# Patient Record
Sex: Female | Born: 1965 | Race: White | Hispanic: No | Marital: Single | Smoking: Never smoker
Health system: Southern US, Community
[De-identification: ages and names within clinical notes are randomized; demographics above are authoritative.]

## PROBLEM LIST (undated history)

## (undated) DIAGNOSIS — F419 Anxiety disorder, unspecified: Secondary | ICD-10-CM

## (undated) DIAGNOSIS — M1990 Unspecified osteoarthritis, unspecified site: Secondary | ICD-10-CM

## (undated) DIAGNOSIS — K922 Gastrointestinal hemorrhage, unspecified: Secondary | ICD-10-CM

## (undated) DIAGNOSIS — J45909 Unspecified asthma, uncomplicated: Secondary | ICD-10-CM

## (undated) DIAGNOSIS — J189 Pneumonia, unspecified organism: Secondary | ICD-10-CM

## (undated) DIAGNOSIS — K649 Unspecified hemorrhoids: Secondary | ICD-10-CM

## (undated) DIAGNOSIS — I10 Essential (primary) hypertension: Secondary | ICD-10-CM

## (undated) DIAGNOSIS — E785 Hyperlipidemia, unspecified: Secondary | ICD-10-CM

## (undated) DIAGNOSIS — K449 Diaphragmatic hernia without obstruction or gangrene: Secondary | ICD-10-CM

## (undated) DIAGNOSIS — F79 Unspecified intellectual disabilities: Secondary | ICD-10-CM

## (undated) DIAGNOSIS — K5520 Angiodysplasia of colon without hemorrhage: Secondary | ICD-10-CM

## (undated) DIAGNOSIS — K5790 Diverticulosis of intestine, part unspecified, without perforation or abscess without bleeding: Secondary | ICD-10-CM

## (undated) DIAGNOSIS — K219 Gastro-esophageal reflux disease without esophagitis: Secondary | ICD-10-CM

## (undated) HISTORY — PX: EYE SURGERY: SHX253

## (undated) HISTORY — PX: CHOLECYSTECTOMY: SHX55

---

## 1998-06-25 ENCOUNTER — Other Ambulatory Visit: Admission: RE | Admit: 1998-06-25 | Discharge: 1998-06-25 | Payer: Self-pay | Admitting: Obstetrics and Gynecology

## 1999-07-06 ENCOUNTER — Other Ambulatory Visit: Admission: RE | Admit: 1999-07-06 | Discharge: 1999-07-06 | Payer: Self-pay | Admitting: Obstetrics and Gynecology

## 1999-10-22 ENCOUNTER — Encounter: Payer: Self-pay | Admitting: Family Medicine

## 1999-10-22 ENCOUNTER — Encounter: Admission: RE | Admit: 1999-10-22 | Discharge: 1999-10-22 | Payer: Self-pay | Admitting: Family Medicine

## 1999-11-23 ENCOUNTER — Encounter: Payer: Self-pay | Admitting: Family Medicine

## 1999-11-23 ENCOUNTER — Encounter: Admission: RE | Admit: 1999-11-23 | Discharge: 1999-11-23 | Payer: Self-pay | Admitting: Family Medicine

## 2000-07-05 ENCOUNTER — Other Ambulatory Visit: Admission: RE | Admit: 2000-07-05 | Discharge: 2000-07-05 | Payer: Self-pay | Admitting: Obstetrics and Gynecology

## 2000-11-17 ENCOUNTER — Emergency Department (HOSPITAL_COMMUNITY): Admission: EM | Admit: 2000-11-17 | Discharge: 2000-11-17 | Payer: Self-pay

## 2001-07-06 ENCOUNTER — Other Ambulatory Visit: Admission: RE | Admit: 2001-07-06 | Discharge: 2001-07-06 | Payer: Self-pay | Admitting: Obstetrics and Gynecology

## 2002-07-12 ENCOUNTER — Other Ambulatory Visit: Admission: RE | Admit: 2002-07-12 | Discharge: 2002-07-12 | Payer: Self-pay | Admitting: Obstetrics and Gynecology

## 2003-01-24 ENCOUNTER — Encounter: Admission: RE | Admit: 2003-01-24 | Discharge: 2003-01-24 | Payer: Self-pay | Admitting: Family Medicine

## 2003-01-24 ENCOUNTER — Encounter: Payer: Self-pay | Admitting: Family Medicine

## 2003-02-04 ENCOUNTER — Ambulatory Visit (HOSPITAL_COMMUNITY): Admission: RE | Admit: 2003-02-04 | Discharge: 2003-02-04 | Payer: Self-pay | Admitting: Gastroenterology

## 2003-09-23 ENCOUNTER — Other Ambulatory Visit: Admission: RE | Admit: 2003-09-23 | Discharge: 2003-09-23 | Payer: Self-pay | Admitting: Obstetrics and Gynecology

## 2004-05-11 ENCOUNTER — Encounter: Admission: RE | Admit: 2004-05-11 | Discharge: 2004-05-11 | Payer: Self-pay | Admitting: Family Medicine

## 2004-06-16 ENCOUNTER — Ambulatory Visit (HOSPITAL_COMMUNITY): Admission: RE | Admit: 2004-06-16 | Discharge: 2004-06-17 | Payer: Self-pay | Admitting: Surgery

## 2004-06-16 ENCOUNTER — Encounter (INDEPENDENT_AMBULATORY_CARE_PROVIDER_SITE_OTHER): Payer: Self-pay | Admitting: *Deleted

## 2004-10-29 ENCOUNTER — Other Ambulatory Visit: Admission: RE | Admit: 2004-10-29 | Discharge: 2004-10-29 | Payer: Self-pay | Admitting: Obstetrics and Gynecology

## 2005-02-28 ENCOUNTER — Encounter: Admission: RE | Admit: 2005-02-28 | Discharge: 2005-02-28 | Payer: Self-pay | Admitting: Family Medicine

## 2005-03-25 ENCOUNTER — Emergency Department (HOSPITAL_COMMUNITY): Admission: EM | Admit: 2005-03-25 | Discharge: 2005-03-25 | Payer: Self-pay | Admitting: Emergency Medicine

## 2005-12-13 ENCOUNTER — Other Ambulatory Visit: Admission: RE | Admit: 2005-12-13 | Discharge: 2005-12-13 | Payer: Self-pay | Admitting: Obstetrics & Gynecology

## 2006-03-20 ENCOUNTER — Ambulatory Visit: Payer: Self-pay | Admitting: Family Medicine

## 2006-03-31 ENCOUNTER — Ambulatory Visit: Payer: Self-pay | Admitting: Family Medicine

## 2006-04-06 ENCOUNTER — Encounter: Admission: RE | Admit: 2006-04-06 | Discharge: 2006-04-06 | Payer: Self-pay | Admitting: Obstetrics and Gynecology

## 2006-07-05 ENCOUNTER — Encounter: Admission: RE | Admit: 2006-07-05 | Discharge: 2006-07-05 | Payer: Self-pay | Admitting: Family Medicine

## 2006-07-05 ENCOUNTER — Ambulatory Visit: Payer: Self-pay | Admitting: Family Medicine

## 2006-07-14 ENCOUNTER — Ambulatory Visit: Payer: Self-pay | Admitting: Family Medicine

## 2006-07-31 ENCOUNTER — Emergency Department (HOSPITAL_COMMUNITY): Admission: EM | Admit: 2006-07-31 | Discharge: 2006-08-01 | Payer: Self-pay | Admitting: Emergency Medicine

## 2006-07-31 ENCOUNTER — Encounter: Admission: RE | Admit: 2006-07-31 | Discharge: 2006-07-31 | Payer: Self-pay | Admitting: Family Medicine

## 2006-07-31 ENCOUNTER — Ambulatory Visit: Payer: Self-pay | Admitting: Family Medicine

## 2006-08-01 ENCOUNTER — Ambulatory Visit: Payer: Self-pay | Admitting: Family Medicine

## 2006-10-03 ENCOUNTER — Ambulatory Visit: Payer: Self-pay | Admitting: Family Medicine

## 2006-10-17 ENCOUNTER — Ambulatory Visit: Payer: Self-pay | Admitting: Family Medicine

## 2006-11-13 ENCOUNTER — Ambulatory Visit: Payer: Self-pay | Admitting: Internal Medicine

## 2006-12-08 ENCOUNTER — Ambulatory Visit: Payer: Self-pay | Admitting: Family Medicine

## 2006-12-14 ENCOUNTER — Ambulatory Visit: Payer: Self-pay | Admitting: Internal Medicine

## 2007-01-10 ENCOUNTER — Other Ambulatory Visit: Admission: RE | Admit: 2007-01-10 | Discharge: 2007-01-10 | Payer: Self-pay | Admitting: Obstetrics and Gynecology

## 2007-03-26 ENCOUNTER — Ambulatory Visit: Payer: Self-pay | Admitting: Family Medicine

## 2007-06-12 ENCOUNTER — Encounter: Admission: RE | Admit: 2007-06-12 | Discharge: 2007-06-12 | Payer: Self-pay | Admitting: Obstetrics and Gynecology

## 2007-08-02 DIAGNOSIS — J309 Allergic rhinitis, unspecified: Secondary | ICD-10-CM | POA: Insufficient documentation

## 2007-08-02 DIAGNOSIS — J45998 Other asthma: Secondary | ICD-10-CM | POA: Insufficient documentation

## 2007-08-17 ENCOUNTER — Ambulatory Visit: Payer: Self-pay | Admitting: Internal Medicine

## 2008-01-25 ENCOUNTER — Other Ambulatory Visit: Admission: RE | Admit: 2008-01-25 | Discharge: 2008-01-25 | Payer: Self-pay | Admitting: Obstetrics and Gynecology

## 2008-07-25 ENCOUNTER — Encounter: Admission: RE | Admit: 2008-07-25 | Discharge: 2008-07-25 | Payer: Self-pay | Admitting: Family Medicine

## 2008-08-11 ENCOUNTER — Encounter: Admission: RE | Admit: 2008-08-11 | Discharge: 2008-08-11 | Payer: Self-pay | Admitting: Family Medicine

## 2009-12-08 DIAGNOSIS — K5730 Diverticulosis of large intestine without perforation or abscess without bleeding: Secondary | ICD-10-CM | POA: Insufficient documentation

## 2009-12-08 DIAGNOSIS — Q2733 Arteriovenous malformation of digestive system vessel: Secondary | ICD-10-CM | POA: Insufficient documentation

## 2010-07-04 ENCOUNTER — Encounter: Payer: Self-pay | Admitting: Obstetrics and Gynecology

## 2010-08-10 DIAGNOSIS — I10 Essential (primary) hypertension: Secondary | ICD-10-CM | POA: Insufficient documentation

## 2010-08-10 DIAGNOSIS — J4540 Moderate persistent asthma, uncomplicated: Secondary | ICD-10-CM | POA: Insufficient documentation

## 2010-10-26 NOTE — Assessment & Plan Note (Signed)
[facility]                             PULMONARY OFFICE NOTE   Shelley [name], Shelley [name]                    MRN:          [id number]  DATE:11/13/2006                            DOB:          08/06/1965    PROBLEM:  A 45 year old woman who comes in the company of her power of  attorney, Shelley [name], with whom she lives, for evaluation of asthma and  allergies.   HISTORY:  The patient has a learning disability and lives with Mrs.  [name].  This is a 45 year old woman diagnosed with asthma and allergic  rhinitis in the 1990s.  She says she was in her late 17s and remembers  noting symptoms first after a bad cold.  She has needed only emergency  room visits without hospitalization and has never had a home nebulizer.  Viral illnesses and pollen exposures have been triggers.  She was skin  test positive and on allergy vaccine a long time ago with specifics  unknown.  She has felt fairly stable, but wishes to establish care.  She  is followed by Dr. Sharlot [name] for primary care.   MEDICATIONS:  1. Maxair.  2. Birth control pill.  3. Triamterene/HCTZ 37.5/25 mg.   Drug intolerant to aspirin and cortisone.   REVIEW OF SYSTEMS:  Mild occasional wheeze, occasional reflux which she  says is not bad, weight fairly stable.  No fever, purulent discharge,  adenopathy, rash, chest pain, nausea, vomiting, joint pain or ankle  edema.   PAST MEDICAL HISTORY:  Hypertension, asthma, allergic rhinitis,  pneumonia in the Winter of 2007, cholecystectomy, recent fracture of  thumb for which her arm is casted.  No recognized problems with exposure  to latex, contrast dye, but aspirin causes nausea.   SOCIAL HISTORY:  Never smoked, not married, no children, not employed.   FAMILY HISTORY:  She is adopted with details unknown.   OBJECTIVE:  Weight 215 pounds, BP 126/71, pulse 71, room air saturation  94%.  A pleasant alert woman.  There is a cast on her left forearm.   No  evident rash.  Adenopathy not found.  HEENT:  Turbinates are edematous.  Conjunctivae are clear.  Pharynx is  clear.  There is no neck vein distension or stridor.  CHEST:  Wheezy cough.  During quiet breathing, there are no rales,  wheeze or rhonchi heard and no dullness.  HEART SOUNDS:  Regular without murmur or gallop.  ABDOMEN:  Softly obese.  EXTREMITIES:  Feet are without cyanosis, clubbing or edema.   IMPRESSION:  1. Allergic rhinitis.  2. Asthma, probably mild and intermittent.   PLAN:  We will schedule pulmonary function test.  I refilled her Maxair  as a rescue inhaler with discussion and gave sample Advair 100/50 with  discussion for 1 puff b.i.d. and mouth care.  Schedule return in one  month, earlier p.r.n.     Shelley D. Maple Hudson, MD, Shelley [name], FACP  Electronically Signed    CDY/MedQ  DD: 11/16/2006  DT: 11/17/2006  Job #: [id number]   cc:   Shelley [name], ShelleyD.

## 2010-10-26 NOTE — Assessment & Plan Note (Signed)
[facility]                             PULMONARY OFFICE NOTE   NAME:Shelley [name]                    MRN:          [id number]  DATE:12/14/2006                            DOB:          [birth year]    PROBLEMS:  1. Allergic rhinitis.  2. Mild intermittent asthma.   HISTORY:  She returns for followup saying she has felt fairly well.  Her  left hand is splinted after breaking her thumb.   MEDICATIONS:  1. Maxair.  2. Birth control pills.  3. Triamterene/HCTZ 37.5/25.  4. Advair 100/50.   DRUG INTOLERANCE:  1. ASPIRIN.  2. CORTISONE.   OBJECTIVE:  VITAL SIGNS:  Weight 224 pounds, BP 132/86, pulse 84, room  air saturation 98%.  HEENT:  Nose and throat are clear.  LUNGS:  Clear to P[employer][name] with breathing unlabored, no cough or wheeze.  HEART:  Sounds regular and normal.   PFT:  Moderate obstructive airway disease with an FEV1 of 1.39 (48% of  predicted), and significant response to bronchodilator.  FEV1FVC ratio  0.54.  There was evidence of air trapping with increased residual volume  and diffusion was moderately reduced at 69%.  These suggest an emphysema  component but more likely it is chronic fixed asthma.   PLAN:  1. I emphasized weight loss and exercise to build endurance and reduce      diaphragm crowding by her abdomen.  2. Try Spiriva once daily to see what effect that has.  3. Blood for alpha-1 antitrypsin level.  4. Schedule return in 1 year, earlier p.r.n.    Clinton D. Maple Hudson, MD, Tonny Bollman, FACP  Electronically Signed   CDY/MedQ  DD: 12/16/2006  DT: 12/16/2006  Job #: [id number]   cc:   Sharlot Gowda, M.D.

## 2010-10-29 NOTE — Op Note (Signed)
[name], [name]             ACCOUNT NO.:  [id number]   MEDICAL RECORD NO.:  [id number]          PATIENT TYPE:  OIB   LOCATION:  NA                           FACILITY:  [facility]   PHYSICIAN:  Currie Paris, M.D.DATE OF BIRTH:  [birth year]   DATE OF PROCEDURE:  06/16/2004  DATE OF DISCHARGE:                                 OPERATIVE REPORT   OFFICE MEDICAL RECORD NO.:  [id number].   PREOPERATIVE DIAGNOSIS:  Chronic calculus cholecystitis.   POSTOPERATIVE DIAGNOSIS:  Chronic calculus cholecystitis.   OPERATION:  Laparoscopic cholecystectomy with intraoperative cholangiogram.   SURGEON:  Currie Paris, M.D.   ASSISTANT:  Ollen Gross. Carolynne Edouard, M.D.   ANESTHESIA:  General endotracheal.   CLINICAL HISTORY:  This patient is a 45 year old with biliary-type symptoms  and the finding of multiple stones in her gallbladder.  She elected to  proceed to cholecystectomy.  We planned a cholangiogram because she had what  looked like a slightly large common duct on ultrasound.   DESCRIPTION OF PROCEDURE:  The patient was seen in the holding area and she  had no further questions.  She was taken to the operating room and after  satisfactory general endotracheal anesthesia had been obtained the abdomen  was prepped and draped.   0.25% plain Marcaine was injected for each incision.  The initial abdominal  incision was made above the umbilicus.  Because of the patient's obesity, I  thought an umbilical incision would provide too long of a length for good  visualization of the camera.  The fascia was identified and opened, a purse-  string was placed, the Hasson inserted and the abdomen insufflated.  The  patient's abdomen was insufflated to 15 and she was placed in reverse  Trendelenburg and tilted to the left.   A 10-11 was placed in the epigastrium and two 5's laterally.  The  gallbladder was retracted over the liver and initial dissection began around  the ampulla of the gallbladder.   This was initially a little difficult to  evaluate and I could not tell if the common duct, which was noted to be  large, was pulled up on the gallbladder.  Rather, the infundibulum of the  gallbladder curved up under it.  With some retraction, we opened the  parenchyma on either side of the gallbladder and I was able to clearly  identify the cystic artery coming up on the gallbladder and once that was  clearly identified I divided it after clipping it.  This gave [place] a little  bit more retraction and I was able to free up some more small vessels coming  up onto the gallbladder and with lateral traction eventually I identified  the cystic duct and made a large window behind it so that we clearly had the  cystic duct identified.  Once this was done, I clipped it and opened it and  did operative cholangiography.  We had good filling of the common duct and  hepatic radicals and duodenum, but there was a question of a tiny stone in  the distal duct.   The cystic duct was fairly  attenuous, so I went ahead and took the catheter  out, put three clips on it and divided it.  The gallbladder was removed from  below to above and was actually packed with stones.  Multiple stones were  spilled as we could not get a dissection plane between the gallbladder and  the liver.  Once we got the gallbladder disconnected, it was placed in a bag  and some of the stones were picked up and placed in the bag initially and  the bag then sealed and parked over the liver.  I then spent about 20  minutes individually retrieving all of the stones that had spilled and once  we had them all out as far as we could tell, we irrigated and took a close  look with a 30 degree scope so we could see back in the crevices behind the  liver and be sure we did not leave any stones behind.  Once we felt that all  the stones were retrieved, the gallbladder was pulled out the umbilical  port.  We did a final irrigation and did remove  the lateral ports.  The  umbilical site was closed with the purse-string and the abdomen desufflated  through the epigastric site.  The skin was closed with 4-0 Monocryl  subcuticular and Dermabond.  Throughout the patient tolerated the procedure  well.  There were no intraoperative complications and all counts were  correct.      Chri   CJS/MEDQ  D:  06/16/2004  T:  06/16/2004  Job:  [id number]   cc:   L. Lupe Carney, M.D.  [address] [place]  [place] [address]  Fax: [phone]

## 2010-10-29 NOTE — Op Note (Signed)
   NAME:  Shelley [name], Shelley [name]                       ACCOUNT NO.:  [id number]   MEDICAL RECORD NO.:  [id number]                   PATIENT TYPE:  AMB   LOCATION:  ENDO                                 FACILITY:  [facility]   PHYSICIAN:  Graylin Shiver, M.D.                DATE OF BIRTH:  01-26-1966   DATE OF PROCEDURE:  02/04/2003  DATE OF DISCHARGE:                                 OPERATIVE REPORT   PROCEDURE:  Colonoscopy.   INDICATION FOR PROCEDURE:  Rectal bleeding.   Informed consent was obtained after explanation of the risks of bleeding,  infection, and perforation.   PREMEDICATIONS:  1. Fentanyl 75 mcg IV.  2. Versed 7 mg IV.   DESCRIPTION OF PROCEDURE:  With the patient in the left lateral decubitus  position, a rectal exam was performed, and no masses were felt.  The Olympus  colonoscope was inserted into the rectum and advanced around the colon to  the cecum.  Cecal landmarks were identified.  The cecum and ascending colon  were normal.  The transverse colon was normal.  The descending colon was  normal.  The sigmoid was normal.  In the distal rectum on retroflexion,  there was some irritation noted just inside the anal canal area.  The area  did not reveal any evidence of tumor, polyp, or other abnormality.  The  scope was then straightened and brought out.  There was some redness noted  in the anal canal.  She does have a history of hemorrhoids which were  surgically clipped last year.   IMPRESSION:  Colonoscopy to the cecum showing some redness just inside the  anal canal and the distal rectum.  This was focal.  This may have been the  source of some bleeding, but there is no evidence of active bleeding now.   PLAN:  I will give this patient a prescription for some Anusol HC  suppositories to use over the next week.  I see nothing further to do or  treat at this time.                                               Graylin Shiver, M.D.    Germain Osgood  D:  02/04/2003  T:   02/04/2003  Job:  [id number]   cc:   L. Lupe Carney, M.D.  [address] [place]  [place] [address]  Fax: [phone]

## 2011-03-17 ENCOUNTER — Ambulatory Visit: Payer: Self-pay | Admitting: *Deleted

## 2011-03-18 ENCOUNTER — Encounter: Payer: Self-pay | Admitting: *Deleted

## 2011-08-18 DIAGNOSIS — E1169 Type 2 diabetes mellitus with other specified complication: Secondary | ICD-10-CM | POA: Insufficient documentation

## 2011-11-29 ENCOUNTER — Emergency Department (HOSPITAL_COMMUNITY): Payer: Medicare Other

## 2011-11-29 ENCOUNTER — Encounter (HOSPITAL_COMMUNITY): Payer: Self-pay | Admitting: Emergency Medicine

## 2011-11-29 ENCOUNTER — Inpatient Hospital Stay (HOSPITAL_COMMUNITY)
Admission: EM | Admit: 2011-11-29 | Discharge: 2011-12-06 | DRG: 193 | Disposition: A | Discharge status: Home Health | Payer: Medicare Other | Source: Ambulatory Visit | Attending: Family Medicine | Admitting: Family Medicine

## 2011-11-29 DIAGNOSIS — M129 Arthropathy, unspecified: Secondary | ICD-10-CM | POA: Diagnosis present

## 2011-11-29 DIAGNOSIS — R0902 Hypoxemia: Secondary | ICD-10-CM

## 2011-11-29 DIAGNOSIS — E876 Hypokalemia: Secondary | ICD-10-CM | POA: Diagnosis present

## 2011-11-29 DIAGNOSIS — J9620 Acute and chronic respiratory failure, unspecified whether with hypoxia or hypercapnia: Secondary | ICD-10-CM | POA: Diagnosis not present

## 2011-11-29 DIAGNOSIS — E662 Morbid (severe) obesity with alveolar hypoventilation: Secondary | ICD-10-CM | POA: Diagnosis present

## 2011-11-29 DIAGNOSIS — K219 Gastro-esophageal reflux disease without esophagitis: Secondary | ICD-10-CM | POA: Diagnosis present

## 2011-11-29 DIAGNOSIS — F411 Generalized anxiety disorder: Secondary | ICD-10-CM | POA: Diagnosis present

## 2011-11-29 DIAGNOSIS — E119 Type 2 diabetes mellitus without complications: Secondary | ICD-10-CM | POA: Diagnosis present

## 2011-11-29 DIAGNOSIS — F79 Unspecified intellectual disabilities: Secondary | ICD-10-CM | POA: Diagnosis present

## 2011-11-29 DIAGNOSIS — Z66 Do not resuscitate: Secondary | ICD-10-CM | POA: Diagnosis not present

## 2011-11-29 DIAGNOSIS — F819 Developmental disorder of scholastic skills, unspecified: Secondary | ICD-10-CM | POA: Diagnosis present

## 2011-11-29 DIAGNOSIS — E669 Obesity, unspecified: Secondary | ICD-10-CM | POA: Diagnosis present

## 2011-11-29 DIAGNOSIS — E785 Hyperlipidemia, unspecified: Secondary | ICD-10-CM | POA: Diagnosis present

## 2011-11-29 DIAGNOSIS — G4733 Obstructive sleep apnea (adult) (pediatric): Secondary | ICD-10-CM | POA: Diagnosis present

## 2011-11-29 DIAGNOSIS — J45901 Unspecified asthma with (acute) exacerbation: Secondary | ICD-10-CM | POA: Diagnosis present

## 2011-11-29 DIAGNOSIS — J9610 Chronic respiratory failure, unspecified whether with hypoxia or hypercapnia: Secondary | ICD-10-CM | POA: Diagnosis present

## 2011-11-29 DIAGNOSIS — E872 Acidosis, unspecified: Secondary | ICD-10-CM | POA: Diagnosis not present

## 2011-11-29 DIAGNOSIS — J45998 Other asthma: Secondary | ICD-10-CM | POA: Diagnosis present

## 2011-11-29 DIAGNOSIS — J45909 Unspecified asthma, uncomplicated: Secondary | ICD-10-CM

## 2011-11-29 DIAGNOSIS — I10 Essential (primary) hypertension: Secondary | ICD-10-CM | POA: Diagnosis present

## 2011-11-29 DIAGNOSIS — J9600 Acute respiratory failure, unspecified whether with hypoxia or hypercapnia: Secondary | ICD-10-CM

## 2011-11-29 DIAGNOSIS — Z6841 Body Mass Index (BMI) 40.0 and over, adult: Secondary | ICD-10-CM

## 2011-11-29 DIAGNOSIS — D649 Anemia, unspecified: Secondary | ICD-10-CM | POA: Diagnosis not present

## 2011-11-29 DIAGNOSIS — Z79899 Other long term (current) drug therapy: Secondary | ICD-10-CM

## 2011-11-29 DIAGNOSIS — J189 Pneumonia, unspecified organism: Principal | ICD-10-CM | POA: Diagnosis present

## 2011-11-29 HISTORY — DX: Unspecified asthma, uncomplicated: J45.909

## 2011-11-29 HISTORY — DX: Unspecified intellectual disabilities: F79

## 2011-11-29 HISTORY — DX: Gastro-esophageal reflux disease without esophagitis: K21.9

## 2011-11-29 HISTORY — DX: Pneumonia, unspecified organism: J18.9

## 2011-11-29 HISTORY — DX: Essential (primary) hypertension: I10

## 2011-11-29 HISTORY — DX: Anxiety disorder, unspecified: F41.9

## 2011-11-29 HISTORY — DX: Unspecified osteoarthritis, unspecified site: M19.90

## 2011-11-29 HISTORY — DX: Hyperlipidemia, unspecified: E78.5

## 2011-11-29 LAB — DIFFERENTIAL
Basophils Absolute: 0 10*3/uL (ref 0.0–0.1)
Basophils Relative: 0 % (ref 0–1)
Lymphs Abs: 2.1 10*3/uL (ref 0.7–4.0)
Monocytes Absolute: 0.9 10*3/uL (ref 0.1–1.0)
Neutro Abs: 4.2 10*3/uL (ref 1.7–7.7)
Neutrophils Relative %: 58 % (ref 43–77)

## 2011-11-29 LAB — CBC
Hemoglobin: 12.5 g/dL (ref 12.0–15.0)
MCH: 27.9 pg (ref 26.0–34.0)
MCHC: 32 g/dL (ref 30.0–36.0)
MCV: 87.3 fL (ref 78.0–100.0)
Platelets: 267 10*3/uL (ref 150–400)
RDW: 14 % (ref 11.5–15.5)

## 2011-11-29 LAB — POCT I-STAT, CHEM 8
Calcium, Ion: 1.12 mmol/L (ref 1.12–1.32)
Chloride: 92 mEq/L — ABNORMAL LOW (ref 96–112)
Creatinine, Ser: 0.8 mg/dL (ref 0.50–1.10)
Glucose, Bld: 173 mg/dL — ABNORMAL HIGH (ref 70–99)
HCT: 41 % (ref 36.0–46.0)
Hemoglobin: 13.9 g/dL (ref 12.0–15.0)

## 2011-11-29 MED ORDER — DEXTROSE 5 % IV SOLN
1.0000 g | INTRAVENOUS | Status: DC
  Administered 2011-11-30 – 2011-12-06 (×6): 1 g via INTRAVENOUS
  Filled 2011-11-29 (×9): qty 10

## 2011-11-29 MED ORDER — ALBUTEROL (5 MG/ML) CONTINUOUS INHALATION SOLN
INHALATION_SOLUTION | RESPIRATORY_TRACT | Status: AC
  Filled 2011-11-29: qty 20

## 2011-11-29 MED ORDER — SODIUM CHLORIDE 0.9 % IV SOLN
INTRAVENOUS | Status: DC
  Administered 2011-11-29: 1000 mL via INTRAVENOUS

## 2011-11-29 MED ORDER — SODIUM CHLORIDE 0.9 % IJ SOLN
3.0000 mL | Freq: Two times a day (BID) | INTRAMUSCULAR | Status: DC
  Administered 2011-11-29 – 2011-12-06 (×11): 3 mL via INTRAVENOUS

## 2011-11-29 MED ORDER — BIOTENE DRY MOUTH MT LIQD
15.0000 mL | Freq: Two times a day (BID) | OROMUCOSAL | Status: DC
Start: 2011-11-30 — End: 2011-12-06
  Administered 2011-11-30 – 2011-12-06 (×13): 15 mL via OROMUCOSAL

## 2011-11-29 MED ORDER — DEXTROSE 5 % IV SOLN
1.0000 g | Freq: Once | INTRAVENOUS | Status: AC
  Administered 2011-11-29: 1 g via INTRAVENOUS
  Filled 2011-11-29: qty 10

## 2011-11-29 MED ORDER — ALBUTEROL SULFATE (5 MG/ML) 0.5% IN NEBU
5.0000 mg | INHALATION_SOLUTION | Freq: Once | RESPIRATORY_TRACT | Status: AC
  Administered 2011-11-29: 5 mg via RESPIRATORY_TRACT
  Filled 2011-11-29: qty 1

## 2011-11-29 MED ORDER — PREDNISONE 50 MG PO TABS
50.0000 mg | ORAL_TABLET | Freq: Every day | ORAL | Status: DC
  Administered 2011-11-29 – 2011-12-03 (×4): 50 mg via ORAL
  Filled 2011-11-29 (×5): qty 1

## 2011-11-29 MED ORDER — ACETAMINOPHEN 325 MG PO TABS
650.0000 mg | ORAL_TABLET | Freq: Four times a day (QID) | ORAL | Status: DC | PRN
  Administered 2011-12-01: 325 mg via ORAL
  Administered 2011-12-01 – 2011-12-02 (×2): 650 mg via ORAL
  Administered 2011-12-03 – 2011-12-04 (×2): 325 mg via ORAL
  Filled 2011-11-29: qty 1
  Filled 2011-11-29: qty 2
  Filled 2011-11-29: qty 1
  Filled 2011-11-29: qty 2
  Filled 2011-11-29: qty 1

## 2011-11-29 MED ORDER — DEXTROSE 5 % IV SOLN
500.0000 mg | Freq: Once | INTRAVENOUS | Status: AC
  Administered 2011-11-29: 500 mg via INTRAVENOUS
  Filled 2011-11-29: qty 500

## 2011-11-29 MED ORDER — ALBUTEROL SULFATE (5 MG/ML) 0.5% IN NEBU
2.5000 mg | INHALATION_SOLUTION | RESPIRATORY_TRACT | Status: DC
  Administered 2011-11-29: 19:00:00 via RESPIRATORY_TRACT
  Administered 2011-11-30 (×5): 2.5 mg via RESPIRATORY_TRACT
  Filled 2011-11-29 (×5): qty 0.5

## 2011-11-29 MED ORDER — NORETHINDRONE 0.35 MG PO TABS: 1.0000 | ORAL_TABLET | Freq: Every day | ORAL | Status: DC

## 2011-11-29 MED ORDER — ALBUTEROL SULFATE (5 MG/ML) 0.5% IN NEBU
2.5000 mg | INHALATION_SOLUTION | RESPIRATORY_TRACT | Status: DC | PRN
  Filled 2011-11-29 (×3): qty 0.5

## 2011-11-29 MED ORDER — FENOFIBRATE 160 MG PO TABS
160.0000 mg | ORAL_TABLET | Freq: Every day | ORAL | Status: DC
  Administered 2011-11-30 – 2011-12-06 (×7): 160 mg via ORAL
  Filled 2011-11-29 (×9): qty 1

## 2011-11-29 MED ORDER — POTASSIUM CHLORIDE 20 MEQ/15ML (10%) PO LIQD
40.0000 meq | Freq: Once | ORAL | Status: AC
  Administered 2011-11-29: 40 meq via ORAL
  Filled 2011-11-29: qty 30

## 2011-11-29 MED ORDER — INSULIN ASPART 100 UNIT/ML ~~LOC~~ SOLN: 0.0000 [IU] | Freq: Three times a day (TID) | SUBCUTANEOUS | Status: DC

## 2011-11-29 MED ORDER — POTASSIUM CHLORIDE IN NACL 40-0.9 MEQ/L-% IV SOLN
INTRAVENOUS | Status: DC
  Administered 2011-11-29: 23:00:00 via INTRAVENOUS
  Filled 2011-11-29 (×2): qty 1000

## 2011-11-29 MED ORDER — MONTELUKAST SODIUM 10 MG PO TABS
10.0000 mg | ORAL_TABLET | Freq: Every day | ORAL | Status: DC
  Administered 2011-11-29 – 2011-12-06 (×7): 10 mg via ORAL
  Filled 2011-11-29 (×10): qty 1

## 2011-11-29 MED ORDER — AZITHROMYCIN 250 MG PO TABS
250.0000 mg | ORAL_TABLET | Freq: Every day | ORAL | Status: AC
  Administered 2011-11-30 – 2011-12-03 (×4): 250 mg via ORAL
  Filled 2011-11-29 (×4): qty 1

## 2011-11-29 MED ORDER — ACETAMINOPHEN 650 MG RE SUPP: 650.0000 mg | Freq: Four times a day (QID) | RECTAL | Status: DC | PRN

## 2011-11-29 MED ORDER — INSULIN ASPART 100 UNIT/ML ~~LOC~~ SOLN
0.0000 [IU] | Freq: Three times a day (TID) | SUBCUTANEOUS | Status: DC
  Administered 2011-11-30: 0 [IU] via SUBCUTANEOUS
  Administered 2011-11-30 (×2): 2 [IU] via SUBCUTANEOUS
  Administered 2011-12-01 (×2): 3 [IU] via SUBCUTANEOUS
  Administered 2011-12-01 – 2011-12-02 (×2): 2 [IU] via SUBCUTANEOUS

## 2011-11-29 MED ORDER — HEPARIN SODIUM (PORCINE) 5000 UNIT/ML IJ SOLN
5000.0000 [IU] | Freq: Three times a day (TID) | INTRAMUSCULAR | Status: DC
  Administered 2011-11-29 – 2011-12-06 (×20): 5000 [IU] via SUBCUTANEOUS
  Filled 2011-11-29 (×25): qty 1

## 2011-11-29 MED ORDER — DOCUSATE SODIUM 100 MG PO CAPS
100.0000 mg | ORAL_CAPSULE | Freq: Every day | ORAL | Status: DC
  Administered 2011-11-30 – 2011-12-06 (×7): 100 mg via ORAL
  Filled 2011-11-29 (×7): qty 1

## 2011-11-29 MED ORDER — LORATADINE 10 MG PO TABS
10.0000 mg | ORAL_TABLET | Freq: Every day | ORAL | Status: DC
  Administered 2011-11-30 – 2011-12-06 (×7): 10 mg via ORAL
  Filled 2011-11-29 (×7): qty 1

## 2011-11-29 MED ORDER — PROMETHAZINE HCL 25 MG PO TABS: 12.5000 mg | ORAL_TABLET | Freq: Four times a day (QID) | ORAL | Status: DC | PRN

## 2011-11-29 MED ORDER — DEXTROSE 5 % IV SOLN: 1.0000 g | INTRAVENOUS | Status: DC

## 2011-11-29 NOTE — H&P (Signed)
Family Medicine Teaching [facility] Admission History and Physical  Patient name: Shelley [name] Medical record number: [id number] Date of birth: 10-Jul-1965 Age: 46 y.o. Gender: female  Primary Care Provider: Maryelizabeth Rowan, MD  Chief Complaint: shortness of breath with increased cough and sputum production.  History of Present Illness: Shelley [name] is a 46 y.o. year old female with a history of asthma presenting with shortness of breath with increased cough and sputum production.   History provided by patient with aid of caregiver. Patient started experiencing symptoms last Friday which including weakness, fatigue, subjective fevers, cough, increased sputum production, and progressive shortness of breath. Also complained of some right ear fullness. She has a history of asthma with only controller medication being singulair which she uses for allergies as well. She says at baseline when healthy she uses albuterol 2 times a day but since Friday has been using at least 4 times per day. No use of oxygen at home. She presented to her PCP today and was found to have an oxygen level of 80% which responded to 83% with a nebulizer. No x-ray facilities and hypoxemia so patient sent to ED for concern of pneumonia or asthma exacerbation.   Currently in ED, patient is in no respiratory distress.  O2 sats have improved on continuous nebulizer.  Patient able to converse without difficulty.  CXR concerning for developing RML bronchopneumonia.  Denies any chest pain, chills, nausea/vomiting.  Endorses difficulty breathing with exertion and subjective fevers at home.   Past Medical History  Diagnosis Date  . Asthma   . Diabetes mellitus   . Hypertension     Past Surgical History  Procedure Date  . Cholecystectomy     2006    Family History  Problem Relation Age of Onset  . Family history unknown: Yes   Social History:  reports that she has never smoked. She does not have any smokeless  tobacco history on file. She reports that she does not drink alcohol or use illicit drugs. History   Social History Narrative   Lives at [facility]. PCP Dr. Duanne Guess on [address] in [place]. Patient accompanied by a helper from her ALF. Patient has lived there for 2 years, appears to hae some mental handicap but this is not readily admitted to.     Allergies:  Allergies  Allergen Reactions  . Fish Allergy Swelling  . Pepto-Bismol (Bismuth Subsalicylate) Nausea And Vomiting  . Cortisone Itching and Rash    Injections and cream only.  PO ok.   No current facility-administered medications on file prior to encounter.   Current Outpatient Prescriptions on File Prior to Encounter  Medication Sig Dispense Refill  . albuterol (PROVENTIL HFA;VENTOLIN HFA) 108 (90 BASE) MCG/ACT inhaler Inhale 2 puffs into the lungs every 6 (six) hours as needed. For shortness of breath.      . fenofibrate 160 MG tablet Take 160 mg by mouth daily.      Marland Kitchen loratadine (CLARITIN) 10 MG tablet Take 10 mg by mouth daily as needed. For allergies      . losartan-hydrochlorothiazide (HYZAAR) 100-25 MG per tablet Take 1 tablet by mouth daily.      . metFORMIN (GLUCOPHAGE) 1000 MG tablet Take 1,000 mg by mouth daily with breakfast.      . montelukast (SINGULAIR) 10 MG tablet Take 10 mg by mouth at bedtime.      . norethindrone (MICRONOR,CAMILA,ERRIN) 0.35 MG tablet Take 1 tablet by mouth daily.  Results for orders placed during the hospital encounter of 11/29/11 (from the past 48 hour(s))  CBC     Status: Normal   Collection Time   11/29/11  2:24 PM      Component Value Range Comment   WBC 7.2  4.0 - 10.5 K/uL    RBC 4.48  3.87 - 5.11 MIL/uL    Hemoglobin 12.5  12.0 - 15.0 g/dL    HCT 57.8  46.9 - 62.9 %    MCV 87.3  78.0 - 100.0 fL    MCH 27.9  26.0 - 34.0 pg    MCHC 32.0  30.0 - 36.0 g/dL    RDW 52.8  41.3 - 24.4 %    Platelets 267  150 - 400 K/uL   DIFFERENTIAL     Status: Normal    Collection Time   11/29/11  2:24 PM      Component Value Range Comment   Neutrophils Relative 58  43 - 77 %    Neutro Abs 4.2  1.7 - 7.7 K/uL    Lymphocytes Relative 30  12 - 46 %    Lymphs Abs 2.1  0.7 - 4.0 K/uL    Monocytes Relative 12  3 - 12 %    Monocytes Absolute 0.9  0.1 - 1.0 K/uL    Eosinophils Relative 0  0 - 5 %    Eosinophils Absolute 0.0  0.0 - 0.7 K/uL    Basophils Relative 0  0 - 1 %    Basophils Absolute 0.0  0.0 - 0.1 K/uL   POCT I-STAT, CHEM 8     Status: Abnormal   Collection Time   11/29/11  2:46 PM      Component Value Range Comment   Sodium 139  135 - 145 mEq/L    Potassium 2.8 (*) 3.5 - 5.1 mEq/L    Chloride 92 (*) 96 - 112 mEq/L    BUN 15  6 - 23 mg/dL    Creatinine, Ser 0.10  0.50 - 1.10 mg/dL    Glucose, Bld 272 (*) 70 - 99 mg/dL    Calcium, Ion 5.36  6.44 - 1.32 mmol/L    TCO2 33  0 - 100 mmol/L    Hemoglobin 13.9  12.0 - 15.0 g/dL    HCT 03.4  74.2 - 59.5 %    Dg Chest 2 View  11/29/2011  *RADIOLOGY REPORT*  Clinical Data: Shortness of breath.  Low oxygen saturations. Productive cough.  CHEST - 2 VIEW  Comparison: Chest x-ray 11/29/2011.  Findings: Projecting in the region of the right middle lobe there is an ill-defined interstitial and airspace opacity, concerning for developing bronchopneumonia.  Lungs otherwise appear clear.  No pleural effusions.  Pulmonary vasculature is normal.  Heart size is borderline enlarged, likely accentuated by low lung volumes. Mediastinal contours are unremarkable.  IMPRESSION: 1.  Findings, as above, concerning for developing right middle lobe bronchopneumonia.  Original Report Authenticated By: Florencia Reasons, M.D.   Dg Chest Port 1 View  11/29/2011  *RADIOLOGY REPORT*  Clinical Data: Hypoxia  PORTABLE CHEST - 1 VIEW  Comparison: 07/05/2006  Findings: Cardiomediastinal silhouette is stable.  No acute infiltrate or pleural effusion.  No pulmonary edema.  Bony structures are stable .  IMPRESSION: No active disease.   Original Report Authenticated By: Natasha Mead, M.D.    ROS-see HPI  Blood pressure 117/75, pulse 104, temperature 99.3 F (37.4 C), temperature source Oral, resp. rate 21, last menstrual period 11/12/2011,  SpO2 96.00%. Physical Exam  Constitutional: She is oriented to person, place, and time. She appears well-developed and well-nourished.       Patient wearing CAT and on 4L at time of assessment.  Obese.   HENT:  Head: Normocephalic and atraumatic.  Mouth/Throat: No oropharyngeal exudate.       Did not take CAT off to examine oral cavity or nostrils. Left and right ear without erythema. Slight bulging of R ear drum with some a mild effusion behind but still pearly and landmarks not obscured.  Right ear canal appears to have some irritation-patient uses q-tips.   Eyes: Conjunctivae are normal. Pupils are equal, round, and reactive to light.  Neck: Normal range of motion. Neck supple. No tracheal deviation present. No thyromegaly present.  Cardiovascular: Regular rhythm and intact distal pulses.  Exam reveals no gallop and no friction rub.   No murmur heard.      Tachycardic while on CAT  Respiratory: She is in respiratory distress (tachypneic into mid 20s. some mild supraclavicular contractions and belly breathing. ). She has wheezes (diffuse prominent end expiratory wheezes. Also with diffuse rhonchi. ). She has no rales.       Able to speak in full sentences.   GI: Bowel sounds are normal. She exhibits distension (firm abdomen (baseline per patient). ). There is no tenderness.  Musculoskeletal: Normal range of motion. She exhibits no edema.  Lymphadenopathy:    She has no cervical adenopathy.  Neurological: She is alert and oriented to person, place, and time.  Skin: Skin is warm and dry.     Assessment/Plan 46 year old female with history of severe persistent asthma presenting with asthma exacerbation and RML pneumonia.   1. Shortness of breath-patient with symptoms over the last 4-5  days concerning for developing pneumonia vs. exacerbation of asthma or combination of both. Given new oxygen requirement and concern for CAP, will need to admit to observe until these improve.  No chest pain so will hold off cardiac enzymes, but will order EKG now.  No crackles and no history of cardiac disease, but will get BNP as we are going to hydrate patient to ensure no element of CHF. Well's criteria only 1.5 for HR and patient on CAT as likely reason - doubt PE and will not further eval.   Asthma, persistent with acute exacerbation-poor control at baseline with albuterol use twice a day. Flare could have been triggered by a virus which laid groundwork for developing pneumonia as well.   -prednisone 50mg  x 5 days  -q2/q1 albuterol given continued wheeze and poor air movement. LIkely change to q4/q2 tomorrow.   -continue oxygen with continuous pulse ox to monitor. CPAP overnight given history of OSA, however patient does not use one at home.   -only controller at home is Singulair. Will need to discharge on minimum of inhaled corticosteroid.  -respiratory to provide pulmonary toilet.   Pneumonia-patient afebrile and does not have WBC. Developing PNA on  CXR with hypoxia. Subjective fevers at home along with increasing sputum production. Think main contributor is asthma but will treat for community acquired pneumonia requiring hospitalization with CTX and azithromycin.   2. DM-only on metformin at home. Will get a1c. Sensitive SSI. Hold home metformin. Stable.  3. HTN-BP while we were in ED was 96/70. Will hold home ace-i and HCTZ. Will monitor and restart as BP tolerates. Stable at this time.  4. Hypokalemia-2.8 on istat. Received 40 meq in ED. Placed on IVF with potassium.  Asymptomatic. Place on telemetry. AM BMET.  5. Hypertriglyceridemia-continue home fenofibrate. Stable.   FEN/GI-carb modified. NS with 40 potassium at 100 ml/hr.  PPx-heparin SQ DIsposition-pending improvement in  respiratory status, transition to q4/q2, improvement in hypoxemia. Will consult SW prior to discharge, patient will be returning to ALF.  Tana Conch 11/29/2011, 8:49 PM  Patient seen and examined by myself and Dr. Durene Cal.  Available data reviewed.  Agree with findings, assessment, and plan as outlined by Dr. Durene Cal.  My additional findings are documented and highlighted above.  Montina Dorrance de [employer], DO 11/29/2011 9:22 PM

## 2011-11-29 NOTE — ED Notes (Signed)
Received report from [employer]. Pt came to the ED because she has been sick x 1 week. She has been having increased SOB and cough. No CP. Caregiver states that she was diagnosed with an ear infection today and primary doctor sent her to the ED for to determine if she has PNA. Currently receiving breathing treatment. No respiratory or cardiac distress at this time. Will continue to monitor.

## 2011-11-29 NOTE — ED Notes (Addendum)
Patient's caregiver states the patient has been sick x 1 week and was seen at pcp cold with cough and earache this morning and was told to come th ED for evaluation for possible pneumonia. Patient is diaphoretic.  Patient states productive cough with thick green colored sputum and low grade fever. Patient states she has hx of asthma. Patient denies N/V, chest pain. Patient states she is SOB. Patient placed on monitor and 4L oxygen with 94%.

## 2011-11-29 NOTE — Progress Notes (Deleted)
Pt. Arrived to unit via stretcher.  Patient alert and oriented and settled in bed.  Pt. Short of breath on exertion.  Continuous pulse ox on per orders.  After reviewing orders, MD wants albuterol nebs q2 hours.  After asking respiratory therapy, that can only be done in step down or ICU. MD on call paged, who ordered patient to be transferred to step down.  Rapid response called and made aware of patient's condition.  Bed requested on step-down.  Will continue to monitor patient.

## 2011-11-29 NOTE — ED Notes (Signed)
Patient resting with NAD and states she is feeling better. Respiratory therapy administering breathing treatment at this time. Patient remains on monitor and 4L oxygen with satsof 98%.

## 2011-11-29 NOTE — Progress Notes (Signed)
Pt. Arrived to unit via stretcher.  Patient alert and oriented and settled in bed.  Pt. Short of breath on exertion.  Continuous pulse ox on per orders.  After reviewing orders, MD wants albuterol nebs q2 hours.  After asking respiratory therapy, that can only be done in step down or ICU. MD on call paged, who ordered patient to be transferred to step down.  Rapid response called and made aware of patient's condition.  Bed requested on step-down.  Will continue to monitor patient. 

## 2011-11-29 NOTE — ED Notes (Signed)
Pt. Stated,  I went to see Dr. Terie Purser and the NP was seen her and told her to come to ER.  Caregiver stated she's been sick with a cough, congested

## 2011-11-29 NOTE — ED Provider Notes (Signed)
History     CSN: [id number]  Arrival date & time 11/29/11  1342   First MD Initiated Contact with Patient 11/29/11 1512      Chief Complaint  Patient presents with  . Shortness of Breath    (Consider location/radiation/quality/duration/timing/severity/associated sxs/prior treatment) HPI Patient presents emergency department with cough, and nasal congestion, over the last 3 days.  Patient, states she has had productive cough of green sputum.  She states she went to her doctor earlier today and I have asked her to come to the emergency department because they were fearful she may have pneumonia.  Patient, states that she has not had abdominal pain, nausea, vomiting, fever, diarrhea, chest pain, or weakness.  Patient, states that she has been having wheezing and shortness of breath.  Patient denies taking any medication prior to arrival for her symptoms. Past Medical History  Diagnosis Date  . Asthma   . Diabetes mellitus   . Hypertension     Past Surgical History  Procedure Date  . Cholecystectomy     2006    Family History  Problem Relation Age of Onset  . Family history unknown: Yes    History  Substance Use Topics  . Smoking status: Never Smoker   . Smokeless tobacco: Not on file  . Alcohol Use: No    OB History    Grav Para Term Preterm Abortions TAB SAB Ect Mult Living                  Review of Systems All other systems negative except as documented in the HPI. All pertinent positives and negatives as reviewed in the HPI.  Allergies  Fish allergy; Pepto-bismol; and Cortisone  Home Medications   Current Outpatient Rx  Name Route Sig Dispense Refill  . ALBUTEROL SULFATE HFA 108 (90 BASE) MCG/ACT IN AERS Inhalation Inhale 2 puffs into the lungs every 6 (six) hours as needed. For shortness of breath.    . DOCUSATE SODIUM 100 MG PO CAPS Oral Take 100 mg by mouth daily.    . ERGOCALCIFEROL 50000 UNITS PO CAPS Oral Take 50,000 Units by mouth once a week.    .  FENOFIBRATE 160 MG PO TABS Oral Take 160 mg by mouth daily.    Marland Kitchen LORATADINE 10 MG PO TABS Oral Take 10 mg by mouth daily as needed. For allergies    . LOSARTAN POTASSIUM-HCTZ 100-25 MG PO TABS Oral Take 1 tablet by mouth daily.    Marland Kitchen METFORMIN HCL 1000 MG PO TABS Oral Take 1,000 mg by mouth daily with breakfast.    . MONTELUKAST SODIUM 10 MG PO TABS Oral Take 10 mg by mouth at bedtime.    . NORETHINDRONE 0.35 MG PO TABS Oral Take 1 tablet by mouth daily.      BP 117/63  Pulse 115  Temp 98.6 F (37 C) (Oral)  Resp 25  SpO2 90%  LMP 11/12/2011  Physical Exam  Constitutional: She is oriented to person, place, and time. She appears well-developed and well-nourished. No distress.  HENT:  Head: Normocephalic and atraumatic.  Mouth/Throat: Oropharynx is clear and moist.  Eyes: Pupils are equal, round, and reactive to light.  Neck: Normal range of motion. Neck supple.  Cardiovascular: Normal rate and regular rhythm.   Pulmonary/Chest: Effort normal. Not tachypneic and not bradypneic. No respiratory distress. She has wheezes in the right upper field, the right middle field, the right lower field, the left upper field, the left middle field and the  left lower field. She has rhonchi in the right middle field. She has no rales.  Neurological: She is alert and oriented to person, place, and time.  Skin: Skin is warm and dry. No rash noted.    ED Course  Procedures (including critical care time)  Labs Reviewed  POCT I-STAT, CHEM 8 - Abnormal; Notable for the following:    Potassium 2.8 (*)     Chloride 92 (*)     Glucose, Bld 173 (*)     All other components within normal limits  CBC  DIFFERENTIAL   Dg Chest 2 View  11/29/2011  *RADIOLOGY REPORT*  Clinical Data: Shortness of breath.  Low oxygen saturations. Productive cough.  CHEST - 2 VIEW  Comparison: Chest x-ray 11/29/2011.  Findings: Projecting in the region of the right middle lobe there is an ill-defined interstitial and airspace  opacity, concerning for developing bronchopneumonia.  Lungs otherwise appear clear.  No pleural effusions.  Pulmonary vasculature is normal.  Heart size is borderline enlarged, likely accentuated by low lung volumes. Mediastinal contours are unremarkable.  IMPRESSION: 1.  Findings, as above, concerning for developing right middle lobe bronchopneumonia.  Original Report Authenticated By: Florencia Reasons, M.D.   Dg Chest Port 1 View  11/29/2011  *RADIOLOGY REPORT*  Clinical Data: Hypoxia  PORTABLE CHEST - 1 VIEW  Comparison: 07/05/2006  Findings: Cardiomediastinal silhouette is stable.  No acute infiltrate or pleural effusion.  No pulmonary edema.  Bony structures are stable .  IMPRESSION: No active disease.  Original Report Authenticated By: Natasha Mead, M.D.     1. CAP (community acquired pneumonia)   2. Hypokalemia   3. Asthma    patient will be admitted to the hospital, the teaching service.she was started on antibiotics for community-acquired pneumonia.  She was given several rounds of albuterol the last being an hour-long nebulized treatment due to her significant wheezing. .   MDM          Carlyle Dolly, PA-C 11/29/11 2117

## 2011-11-29 NOTE — ED Provider Notes (Signed)
Medical screening examination/treatment/procedure(s) were conducted as a shared visit with non-physician practitioner(s) and myself.  I personally evaluated the patient during the encounter   Loren Racer, MD 11/29/11 2317

## 2011-11-30 DIAGNOSIS — J45901 Unspecified asthma with (acute) exacerbation: Secondary | ICD-10-CM

## 2011-11-30 DIAGNOSIS — J159 Unspecified bacterial pneumonia: Secondary | ICD-10-CM

## 2011-11-30 DIAGNOSIS — F70 Mild intellectual disabilities: Secondary | ICD-10-CM

## 2011-11-30 DIAGNOSIS — R0902 Hypoxemia: Secondary | ICD-10-CM

## 2011-11-30 LAB — CBC
Platelets: 248 10*3/uL (ref 150–400)
RBC: 4.07 MIL/uL (ref 3.87–5.11)
WBC: 7.1 10*3/uL (ref 4.0–10.5)

## 2011-11-30 LAB — GLUCOSE, CAPILLARY
Glucose-Capillary: 157 mg/dL — ABNORMAL HIGH (ref 70–99)
Glucose-Capillary: 179 mg/dL — ABNORMAL HIGH (ref 70–99)
Glucose-Capillary: 177 mg/dL — ABNORMAL HIGH (ref 70–99)

## 2011-11-30 LAB — BASIC METABOLIC PANEL
Calcium: 8.4 mg/dL (ref 8.4–10.5)
GFR calc non Af Amer: >90 mL/min (ref >90)
Sodium: 137 mEq/L (ref 135–145)

## 2011-11-30 LAB — HEMOGLOBIN A1C
Hgb A1c MFr Bld: 6.8 % — ABNORMAL HIGH (ref <5.7)
Mean Plasma Glucose: 148 mg/dL — ABNORMAL HIGH (ref <117)

## 2011-11-30 LAB — CREATININE, SERUM
Creatinine, Ser: 0.65 mg/dL (ref 0.50–1.10)
GFR calc Af Amer: >90 mL/min (ref >90)
GFR calc non Af Amer: >90 mL/min (ref >90)

## 2011-11-30 MED ORDER — IPRATROPIUM BROMIDE 0.02 % IN SOLN: 0.5000 mg | RESPIRATORY_TRACT | Status: DC

## 2011-11-30 MED ORDER — ALBUTEROL SULFATE (5 MG/ML) 0.5% IN NEBU: 2.5000 mg | INHALATION_SOLUTION | RESPIRATORY_TRACT | Status: DC

## 2011-11-30 MED ORDER — FLUTICASONE PROPIONATE HFA 44 MCG/ACT IN AERO
2.0000 | INHALATION_SPRAY | Freq: Two times a day (BID) | RESPIRATORY_TRACT | Status: DC
  Administered 2011-11-30 – 2011-12-02 (×4): 2 via RESPIRATORY_TRACT
  Filled 2011-11-30: qty 10.6

## 2011-11-30 MED ORDER — ALBUTEROL SULFATE (5 MG/ML) 0.5% IN NEBU
2.5000 mg | INHALATION_SOLUTION | RESPIRATORY_TRACT | Status: DC
  Administered 2011-11-30 – 2011-12-01 (×9): 2.5 mg via RESPIRATORY_TRACT
  Filled 2011-11-30 (×8): qty 0.5

## 2011-11-30 NOTE — Progress Notes (Signed)
FMTS Attending Daily Note: Deondrea Aguado MD [phone] pager office [phone] I  have seen and examined this patient, reviewed their chart. I have discussed this patient with the resident. I agree with the resident's findings, assessment and care plan. 

## 2011-11-30 NOTE — H&P (Signed)
Family Medicine Teaching Service Attending Note  I interviewed and examined patient Benn and reviewed their tests and x-rays.  I discussed with Dr. Tye Savoy and reviewed their note for today.  I agree with their assessment and plan.     Additionally  This am feels better mild shortness of breath Lung - diffuse wheeze with only moderate inspiration despite encouraging her Oriented x 3 but slow to answer Sats - low 90s on 4 liters Agree with treatment for CAP and bronchospasm.   Hypoxia may be related to obesity hypoventilation but if does not improve consider check d dimer.  Would stop IVF and start Incentive Spirometry

## 2011-11-30 NOTE — Clinical Social Work Psychosocial (Signed)
     Clinical Social Work Department BRIEF PSYCHOSOCIAL ASSESSMENT 11/30/2011  Patient:  Shelley [name], Shelley [name]     Account Number:  [id number]     Admit date:  11/29/2011  Clinical Social Worker:  Lourdes Sledge  Date/Time:  11/30/2011 10:15 AM  Referred by:  Physician  Date Referred:  11/30/2011 Referred for  ALF Placement   Other Referral:   Interview type:  Patient Other interview type:   CSW also spoke to pt care giver Shelley [name] by calling [phone]    PSYCHOSOCIAL DATA Living Status:  FACILITY Admitted from facility:   Level of care:  Independent Living Primary support name:  Shelley [name] Primary support relationship to patient:  FAMILY Degree of support available:   Pt lives in Independent living with her aunt Shelley [name] [phone] however pt stated her primary support is her cousin Shelley [name] however was unable to provide a contact numbe.    CURRENT CONCERNS Current Concerns  Post-Acute Placement   Other Concerns:    SOCIAL WORK ASSESSMENT / PLAN CSW received a referral that pt is from an ALF.    CSW visited pt room and informed that pt is from [place]. Pt stated she has lived there for about 1 year. Pt stated she likes where she lives and would like to return at dc. CSW informed pt that CSW would follow up and confirm pt able to return.    CSW contacted pt care taker as pt seemed a little confused when answering CSW questions and what type of facility she was from.    CSW contacted [place] who informed CSW that pt lives in the Independent Living facility with pt aunt Shelley [name].    CSW contacted pt emergency contact Shelley [name], however [name] caretaker Shelley [name] answered the phone and stated [name] was not available. Shelley [name] confirmed that pt lives with her aunt in the independent facility. CSW also informed that Shelley [name] son was on his way to the hospital to visit pt. Shelley [name] stated she would provide transportation whenever pt ready for  dc. Pt has no CSW needs as she is from an [facility] and has transportation at [employer].   Assessment/plan status:  No Further Intervention Required Other assessment/ plan:   Information/referral to community resources:   CSW provided pt CSW contact information. Pt is from an Independent living facility and has transportation at dc. Pt reports being pleased with living arrangements and has no concerns or need for additional assistance/resources.    PATIENTS/FAMILYS RESPONSE TO PLAN OF CARE: Pt sitting up in the chair, alert and oriented. Pt did seem to have some delay in processing information. Pt wasnt sure if she lived in an ALF or Independent residence. CSW confirmed pt housing with [employer] and with pt care giver Shelley [name]. Shelley [name] confirmed pt able to return and transportation is available. Pt not in need of CSW assistance.

## 2011-11-30 NOTE — Progress Notes (Signed)
Utilization Review Completed.  Shelley [name]  11/30/2011  

## 2011-11-30 NOTE — Progress Notes (Signed)
PGY-1 Daily Progress Note Family Medicine Teaching Service Aldine Contes. Marti Sleigh, MD Service Pager: [phone]   Subjective: Patient states breathing much improved. She really thinks treatments are helping. She does not like her breakfast.   Objective:  Temp:  [97.4 F (36.3 C)-99.6 F (37.6 C)] 98.1 F (36.7 C) (06/19 0804) Pulse Rate:  [23-118] 110  (06/19 0804) Resp:  [12-26] 12  (06/19 0804) BP: (110-140)/(63-94) 112/94 mmHg (06/19 0804) SpO2:  [83 %-98 %] 91 % (06/19 0804) Weight:  [285 lb 15 oz (129.7 kg)-287 lb 7.7 oz (130.4 kg)] 287 lb 7.7 oz (130.4 kg) (06/19 0100)  Intake/Output Summary (Last 24 hours) at 11/30/11 0813 Last data filed at 11/30/11 0600  Gross per 24 hour  Intake    603 ml  Output    400 ml  Net    203 ml    Physical Exam  Constitutional: She is oriented to person, place, and time. She appears well-developed and well-nourished.  On 4L [place]. Obese.  Mouth/Throat: MMM, did not reexamine ear.  Eyes: Conjunctivae are normal. Pupils are equal, round, and reactive to light.  Neck: Normal range of motion. Neck supple. No tracheal deviation present. No thyromegaly present.  Cardiovascular: Regular rhythm and intact distal pulses. Exam reveals no gallop and no friction rub.  No murmur heard. Slightly tachy to low 100s.  Respiratory: Respiratory status much improved compared to last night. No longer tachpneic. No retractions or belly breathing. Still with diffuse wheeze and rhonchi. Able to speak in full sentences. Improved air movement as well though still somewhat tight.  GI: Bowel sounds are normal. . There is no tenderness.  Musculoskeletal: Normal range of motion. Trace edema.   Neurological: She is alert and oriented to person, place, and time.  Skin: Skin is warm and dry.    Labs and imaging:   CBC  Lab 11/29/11 2332 11/29/11 1446 11/29/11 1424  WBC 7.1 -- 7.2  HGB 11.2* 13.9 12.5  HCT 36.0 41.0 39.1  PLT 248 -- 267   BMET/CMET  Lab 11/30/11  0410 11/29/11 2332 11/29/11 1446  NA 137 -- 139  K 4.3 -- 2.8*  CL 96 -- 92*  CO2 32 -- --  BUN 10 -- 15  CREATININE 0.61 0.65 0.80  CALCIUM 8.4 -- --  PROT -- -- --  BILITOT -- -- --  ALKPHOS -- -- --  ALT -- -- --  AST -- -- --  GLUCOSE 246* -- 173*    Dg Chest 2 View  11/29/2011  *RADIOLOGY REPORT*  Clinical Data: Shortness of breath.  Low oxygen saturations. Productive cough.  CHEST - 2 VIEW  Comparison: Chest x-ray 11/29/2011.  Findings: Projecting in the region of the right middle lobe there is an ill-defined interstitial and airspace opacity, concerning for developing bronchopneumonia.  Lungs otherwise appear clear.  No pleural effusions.  Pulmonary vasculature is normal.  Heart size is borderline enlarged, likely accentuated by low lung volumes. Mediastinal contours are unremarkable.  IMPRESSION: 1.  Findings, as above, concerning for developing right middle lobe bronchopneumonia.  Original Report Authenticated By: Florencia Reasons, M.D.   Dg Chest Port 1 View  11/29/2011  *RADIOLOGY REPORT*  Clinical Data: Hypoxia  PORTABLE CHEST - 1 VIEW  Comparison: 07/05/2006  Findings: Cardiomediastinal silhouette is stable.  No acute infiltrate or pleural effusion.  No pulmonary edema.  Bony structures are stable .  IMPRESSION: No active disease.  Original Report Authenticated By: Natasha Mead, M.D.     Assessment  46 year old female with history of severe persistent asthma presenting with asthma exacerbation and RML pneumonia.   1. Shortness of breath-patient with symptoms over the last 4-5 days concerning for developing pneumonia vs. exacerbation of asthma or combination of both. BNP not elevated.  Well's criteria only 1.5 for HR and patient on q2/q1 albuterol. Would consider D-dimer if does not continue to improve.   Asthma, persistent with acute exacerbation-poor control at baseline with albuterol use twice a day. Flare could have been triggered by a virus which laid groundwork for  developing pneumonia as well.   -prednisone 50mg  x 5 days   -q2/q1 albuterol given continued wheeze and poor air movement.   -continue oxygen with continuous pulse ox to monitor.   -CPAP ordered due to OSA but patient not using at home   -only controller at home is Singulair. Will need to discharge on minimum of inhaled corticosteroid.   -respiratory to provide pulmonary toilet-IS specifically ordered  Pneumonia-patient afebrile and does not have WBC. Developing PNA on CXR with hypoxia. Subjective fevers at home along with increasing sputum production. Think main contributor is asthma but will treat for community acquired pneumonia requiring hospitalization with CTX and azithromycin.     2. DM-only on metformin at home. Will get a1c. Sensitive SSI. Hold home metformin. CBGs <250. Has not received any insulin to this point . 3. HTN-BP while we were in ED was 96/70. Will hold home ace-i and HCTZ. Will monitor and restart as BP tolerates. Stable at this time-DBP elevated but systolic will still not.  4. Hypokalemia-2.8 on istat. Received 40 meq in ED. Asymptomatic. Placed on telemetry. Potassium this AM 4.3.  5. Hypertriglyceridemia-continue home fenofibrate. Stable.    FEN/GI-carb modified. NS with 40 potassium at 100 ml/hr initially. SLIV today.  PPx-heparin SQ  DIsposition-pending improvement in respiratory status, transition to q4/q2, improvement in hypoxemia. SW consulted as will return to ALF.    Tana Conch, MD PGY1, Family Medicine Teaching Service [phone] 11/30/2011 9:59 AM

## 2011-12-01 LAB — GLUCOSE, CAPILLARY
Glucose-Capillary: 213 mg/dL — ABNORMAL HIGH (ref 70–99)
Glucose-Capillary: 181 mg/dL — ABNORMAL HIGH (ref 70–99)

## 2011-12-01 LAB — BASIC METABOLIC PANEL
BUN: 9 mg/dL (ref 6–23)
Calcium: 8.9 mg/dL (ref 8.4–10.5)
GFR calc Af Amer: >90 mL/min (ref >90)
GFR calc non Af Amer: >90 mL/min (ref >90)
Potassium: 4.3 mEq/L (ref 3.5–5.1)
Sodium: 140 mEq/L (ref 135–145)

## 2011-12-01 MED ORDER — IPRATROPIUM BROMIDE 0.02 % IN SOLN: 0.5000 mg | RESPIRATORY_TRACT | Status: DC

## 2011-12-01 MED ORDER — HYDROCHLOROTHIAZIDE 25 MG PO TABS
25.0000 mg | ORAL_TABLET | Freq: Every day | ORAL | Status: DC
  Administered 2011-12-01 – 2011-12-02 (×2): 25 mg via ORAL
  Filled 2011-12-01 (×3): qty 1

## 2011-12-01 MED ORDER — IPRATROPIUM BROMIDE 0.02 % IN SOLN
0.5000 mg | RESPIRATORY_TRACT | Status: DC
  Administered 2011-12-01 – 2011-12-03 (×11): 0.5 mg via RESPIRATORY_TRACT
  Filled 2011-12-01 (×11): qty 2.5

## 2011-12-01 MED ORDER — PANTOPRAZOLE SODIUM 40 MG PO TBEC
40.0000 mg | DELAYED_RELEASE_TABLET | Freq: Every day | ORAL | Status: DC
  Administered 2011-12-01 – 2011-12-02 (×2): 40 mg via ORAL
  Filled 2011-12-01 (×2): qty 1

## 2011-12-01 MED ORDER — ALBUTEROL SULFATE (5 MG/ML) 0.5% IN NEBU
2.5000 mg | INHALATION_SOLUTION | RESPIRATORY_TRACT | Status: DC
  Administered 2011-12-01 (×2): 2.5 mg via RESPIRATORY_TRACT
  Filled 2011-12-01 (×3): qty 0.5

## 2011-12-01 MED ORDER — IPRATROPIUM BROMIDE 0.02 % IN SOLN
0.5000 mg | RESPIRATORY_TRACT | Status: DC
  Administered 2011-12-01 (×2): 0.5 mg via RESPIRATORY_TRACT
  Filled 2011-12-01 (×3): qty 2.5

## 2011-12-01 MED ORDER — ALBUTEROL SULFATE (5 MG/ML) 0.5% IN NEBU: 2.5000 mg | INHALATION_SOLUTION | RESPIRATORY_TRACT | Status: DC

## 2011-12-01 MED ORDER — ALBUTEROL SULFATE (5 MG/ML) 0.5% IN NEBU
2.5000 mg | INHALATION_SOLUTION | RESPIRATORY_TRACT | Status: DC
  Administered 2011-12-01 – 2011-12-04 (×21): 2.5 mg via RESPIRATORY_TRACT
  Filled 2011-12-01 (×20): qty 0.5

## 2011-12-01 NOTE — Progress Notes (Signed)
eLink Physician-Brief Progress Note Patient Name: Shelley [name] DOB: 13-Jul-1965 MRN: [id number]  Date of Service  12/01/2011   HPI/Events of Note   NEEDS SUP   eICU Interventions  PPI ORDERED    Intervention Category Intermediate Interventions: Best-practice therapies (e.g. DVT, beta blocker, etc.)  Shan Levans 12/01/2011, 3:35 PM

## 2011-12-01 NOTE — Clinical Documentation Improvement (Signed)
GENERIC DOCUMENTATION CLARIFICATION QUERY  THIS DOCUMENT IS NOT A PERMANENT PART OF THE MEDICAL RECORD  TO RESPOND TO THE THIS QUERY, FOLLOW THE INSTRUCTIONS BELOW:  1. If needed, update documentation for the patient's encounter via the notes activity.  2. Access this query again and click edit on the In [employer].  3. After updating, or not, click F2 to complete all highlighted (required) fields concerning your review. Select "additional documentation in the medical record" OR "no additional documentation provided".  4. Click Sign note button.  5. The deficiency will fall out of your In Basket *Please let us know if you are not able to complete this workflow by phone or e-mail (listed below).  Please update your documentation within the medical record to reflect your response to this query.                                                                                        12/01/11   Dear Dr. [facility],  In a better effort to capture your patient's severity of illness, reflect appropriate length of stay and utilization of resources, a review of the patient medical record has revealed the following indicators.   Based on your clinical judgment, please clarify and document in a progress note and/or discharge summary the clinical condition associated with the following supporting information:  In responding to this query please exercise your independent judgment.  The fact that a query is asked, does not imply that any particular answer is desired or expected.   Please help clarify and explain further what is meant by "Likely elements of obesity hypoventilation".  Please document possible , probable or suspected condition in progress notes and discharge summary.   Possible Clinical Conditions  Obesity Hypoventilation Syndrome  Other Condition  Cannot Clinically Determine   Risk Factors: "Obese" per notes Hx OSA  Signs & Symptoms: "Seems to desat while  sleeping (likely chronic)" per notes "Sleepy during daytime and have poor posture and not get good airflow. Likely contributing to retention" per notes "Hypoxia" per notes   Treatment CPAP as needed [place]@4 -5lpm Continuous pulse oximetry Prednisone 50mg  po daily Atrovent/Albuterol Nebs Q1-2h prn  You may use possible, probable, or suspect with inpatient documentation. possible, probable, suspected diagnoses MUST be documented at the time of discharge  Reviewed: additional documentation in the medical record  Thank You,    Rossie Muskrat RN, BSN  Clinical Documentation Specialist Pager:  [phone] [email or web address]  Health Information Management [place]

## 2011-12-01 NOTE — Progress Notes (Signed)
FMTS Attending Daily Note: Denny Levy MD [phone] pager office [phone] I  have seen and examined this patient, reviewed their chart. I have discussed this patient with the resident. I agree with the resident's findings, assessment and care plan. 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 She adamantly refuses CPAP.  RT is going to try some nasal pillows. My biggest concern is long term for her--if she will not use CPAP she will have increased risk pulmionary htn etc.  She has some developmental delay and seems to function on about level of 41-83 year old (my very brief exam).

## 2011-12-01 NOTE — Progress Notes (Signed)
Patient ID: Shelley [name], female   DOB: 10-16-1965, 46 y.o.   MRN: [id number] PGY-1 Daily Progress Note Family Medicine Teaching Service Aldine Contes. Marti Sleigh, MD Service Pager: [phone]   Subjective:  Patient says breathing is slightly better than yesterday.    Objective:  Temp:  [98.1 F (36.7 C)-99.7 F (37.6 C)] 98.5 F (36.9 C) (06/20 0324) Pulse Rate:  [96-111] 96  (06/20 0600) Resp:  [12-28] 18  (06/20 0600) BP: (104-145)/(67-94) 134/77 mmHg (06/20 0600) SpO2:  [88 %-99 %] 96 % (06/20 0722) Weight:  [291 lb 7.2 oz (132.2 kg)] 291 lb 7.2 oz (132.2 kg) (06/20 0324)  Intake/Output Summary (Last 24 hours) at 12/01/11 0729 Last data filed at 11/30/11 2100  Gross per 24 hour  Intake    783 ml  Output    300 ml  Net    483 ml    Physical Exam  Constitutional: She is oriented to person, place, and time. She appears well-developed and well-nourished.  On 5L . Obese.  Mouth/Throat: MMM, did not reexamine ear.  Eyes: Conjunctivae are normal. Pupils are equal, round, and reactive to light.  Neck: Normal range of motion. Neck supple. No tracheal deviation present. No thyromegaly present.  Cardiovascular: Regular rhythm and intact distal pulses. Exam reveals no gallop and no friction rub.  No murmur heard. Slightly tachy to low 100s.  Respiratory: Respiratory status similar to yesterday. RR approximately 20. Still with diffuse wheeze and rhonchi. Able to speak in full sentences. Air movement continues to be decreased (improved slightly from admission).  GI: Bowel sounds are normal. . There is no tenderness.  Musculoskeletal: Normal range of motion. Trace edema.   Neurological: She is alert and oriented to person, place, and time.  Skin: Skin is warm and dry.    Labs and imaging:   CBC  Lab 11/29/11 2332 11/29/11 1446 11/29/11 1424  WBC 7.1 -- 7.2  HGB 11.2* 13.9 12.5  HCT 36.0 41.0 39.1  PLT 248 -- 267   BMET/CMET  Lab 12/01/11 0430 11/30/11 0410 11/29/11 2332  11/29/11 1446  NA 140 137 -- 139  K 4.3 4.3 -- 2.8*  CL 98 96 -- 92*  CO2 36* 32 -- --  BUN 9 10 -- 15  CREATININE 0.55 0.61 0.65 --  CALCIUM 8.9 8.4 -- --  PROT -- -- -- --  BILITOT -- -- -- --  ALKPHOS -- -- -- --  ALT -- -- -- --  AST -- -- -- --  GLUCOSE 219* 246* -- 173*    Dg Chest 2 View  11/29/2011  *RADIOLOGY REPORT*  Clinical Data: Shortness of breath.  Low oxygen saturations. Productive cough.  CHEST - 2 VIEW  Comparison: Chest x-ray 11/29/2011.  Findings: Projecting in the region of the right middle lobe there is an ill-defined interstitial and airspace opacity, concerning for developing bronchopneumonia.  Lungs otherwise appear clear.  No pleural effusions.  Pulmonary vasculature is normal.  Heart size is borderline enlarged, likely accentuated by low lung volumes. Mediastinal contours are unremarkable.  IMPRESSION: 1.  Findings, as above, concerning for developing right middle lobe bronchopneumonia.  Original Report Authenticated By: Florencia Reasons, M.D.   Dg Chest Port 1 View  11/29/2011  *RADIOLOGY REPORT*  Clinical Data: Hypoxia  PORTABLE CHEST - 1 VIEW  Comparison: 07/05/2006  Findings: Cardiomediastinal silhouette is stable.  No acute infiltrate or pleural effusion.  No pulmonary edema.  Bony structures are stable .  IMPRESSION: No active disease.  Original Report Authenticated By: Natasha Mead, M.D.     Assessment  45 year old female with history of severe persistent asthma presenting with asthma exacerbation and RML pneumonia.   1. Shortness of breath-patient with symptoms over the last 4-5 days concerning for developing pneumonia vs. exacerbation of asthma or combination of both. BNP not elevated.  Well's criteria only 1.5 for HR and patient on q2/q1 albuterol. Would consider D-dimer if does not continue to improve but given wheezing asthma/PNA seem primary cause  Asthma, persistent with acute exacerbation-poor control at baseline with albuterol use twice a day.  Flare could have been triggered by a virus which laid groundwork for developing pneumonia as well.   -patient minimally improved but stable from yesterday. Continues to require stepdown care.   -bicarb increased to 36 from 32 today-continue to monitor. Probable baseline obesity hypoventilation. Also with OSA, poor posture contributing.    -nursing/respiratory to try a nasal CPAP to see if will help patient who seems to desat while sleeping (likely chronic) then be sleepy during daytime and have poor posture and not get good airflow. Likely contributing to retention.   -prednisone 50mg  x 5 days   -q2/q1 albuterol given continued wheeze and poor air movement. Have changed q2 to duonebs.   -continue oxygen with continuous pulse ox to monitor.   -CPAP ordered due to OSA but patient not using at home   -only controller at home is Singulair. Started inhaled corticosteroid  -respiratory to provide pulmonary toilet-IS specifically ordered  Pneumonia-patient afebrile and does not have WBC. Developing PNA on CXR with hypoxia. Subjective fevers at home along with increasing sputum production. Think main contributor is asthma but will treat for community acquired pneumonia requiring hospitalization with CTX and azithromycin.   2. DM-only on metformin at home. A1c 6.8. Sensitive SSI. Hold home metformin. CBGs <180 with exception of AM CBG. 3. HTN-slightly increased over yesterday. I will restart HCTZ at this time and continue to hold ace-i.   4. Hypertriglyceridemia-continue home fenofibrate. Stable.    FEN/GI-carb modified. NS with 40 potassium at 100 ml/hr initially. SLIV today. repleted hypokalemia at time of admission-stable.  PPx-heparin SQ  DIsposition-pending improvement in respiratory status, transition to q4/q2, improvement in hypoxemia. SW consulted as will return to ALF.    Tana Conch, MD PGY1, Family Medicine Teaching Service [phone] 12/01/2011 7:29 AM

## 2011-12-02 ENCOUNTER — Inpatient Hospital Stay (HOSPITAL_COMMUNITY): Payer: Medicare Other

## 2011-12-02 DIAGNOSIS — E662 Morbid (severe) obesity with alveolar hypoventilation: Secondary | ICD-10-CM

## 2011-12-02 DIAGNOSIS — J45909 Unspecified asthma, uncomplicated: Secondary | ICD-10-CM

## 2011-12-02 DIAGNOSIS — J9600 Acute respiratory failure, unspecified whether with hypoxia or hypercapnia: Secondary | ICD-10-CM | POA: Diagnosis present

## 2011-12-02 DIAGNOSIS — J9610 Chronic respiratory failure, unspecified whether with hypoxia or hypercapnia: Secondary | ICD-10-CM | POA: Diagnosis present

## 2011-12-02 DIAGNOSIS — R0902 Hypoxemia: Secondary | ICD-10-CM | POA: Diagnosis present

## 2011-12-02 DIAGNOSIS — J189 Pneumonia, unspecified organism: Secondary | ICD-10-CM | POA: Diagnosis present

## 2011-12-02 DIAGNOSIS — G4733 Obstructive sleep apnea (adult) (pediatric): Secondary | ICD-10-CM | POA: Diagnosis present

## 2011-12-02 DIAGNOSIS — J9620 Acute and chronic respiratory failure, unspecified whether with hypoxia or hypercapnia: Secondary | ICD-10-CM

## 2011-12-02 DIAGNOSIS — F819 Developmental disorder of scholastic skills, unspecified: Secondary | ICD-10-CM | POA: Diagnosis present

## 2011-12-02 LAB — BASIC METABOLIC PANEL
CO2: 41 mEq/L (ref 19–32)
Calcium: 9.6 mg/dL (ref 8.4–10.5)
Creatinine, Ser: 0.59 mg/dL (ref 0.50–1.10)
GFR calc non Af Amer: >90 mL/min (ref >90)
Sodium: 140 mEq/L (ref 135–145)

## 2011-12-02 LAB — CBC
MCH: 27.1 pg (ref 26.0–34.0)
MCHC: 29 g/dL — ABNORMAL LOW (ref 30.0–36.0)
MCV: 93.5 fL (ref 78.0–100.0)
Platelets: 249 10*3/uL (ref 150–400)
RBC: 3.98 MIL/uL (ref 3.87–5.11)
RDW: 13.9 % (ref 11.5–15.5)

## 2011-12-02 LAB — BLOOD GAS, ARTERIAL
Acid-Base Excess: 17.9 mmol/L — ABNORMAL HIGH (ref 0.0–2.0)
Bicarbonate: 45.5 mEq/L — ABNORMAL HIGH (ref 20.0–24.0)
Drawn by: 30599
Expiratory PAP: 8
O2 Content: 4 L/min
Patient temperature: 98.6
TCO2: 47.7 mmol/L (ref 0–100)
TCO2: 48.1 mmol/L (ref 0–100)
pH, Arterial: 7.312 — ABNORMAL LOW (ref 7.350–7.400)
pH, Arterial: 7.353 (ref 7.350–7.400)
pO2, Arterial: 110 mmHg — ABNORMAL HIGH (ref 80.0–100.0)

## 2011-12-02 LAB — GLUCOSE, CAPILLARY
Glucose-Capillary: 158 mg/dL — ABNORMAL HIGH (ref 70–99)
Glucose-Capillary: 206 mg/dL — ABNORMAL HIGH (ref 70–99)

## 2011-12-02 MED ORDER — POTASSIUM CHLORIDE 20 MEQ/15ML (10%) PO LIQD
40.0000 meq | Freq: Three times a day (TID) | ORAL | Status: AC
  Administered 2011-12-02 (×2): 40 meq
  Filled 2011-12-02 (×2): qty 30

## 2011-12-02 MED ORDER — FUROSEMIDE 10 MG/ML IJ SOLN
40.0000 mg | Freq: Three times a day (TID) | INTRAMUSCULAR | Status: AC
  Administered 2011-12-02 (×2): 40 mg via INTRAVENOUS
  Filled 2011-12-02 (×2): qty 4

## 2011-12-02 MED ORDER — LOSARTAN POTASSIUM 50 MG PO TABS
100.0000 mg | ORAL_TABLET | Freq: Every day | ORAL | Status: DC
  Administered 2011-12-02: 100 mg via ORAL
  Filled 2011-12-02 (×3): qty 2

## 2011-12-02 MED ORDER — ACETAZOLAMIDE SODIUM 500 MG IJ SOLR
250.0000 mg | Freq: Three times a day (TID) | INTRAMUSCULAR | Status: AC
  Administered 2011-12-02 (×2): 250 mg via INTRAVENOUS
  Filled 2011-12-02 (×2): qty 500

## 2011-12-02 MED ORDER — INSULIN ASPART 100 UNIT/ML ~~LOC~~ SOLN
0.0000 [IU] | Freq: Three times a day (TID) | SUBCUTANEOUS | Status: DC
  Administered 2011-12-02 (×2): 5 [IU] via SUBCUTANEOUS
  Administered 2011-12-03: 8 [IU] via SUBCUTANEOUS
  Administered 2011-12-03: 5 [IU] via SUBCUTANEOUS
  Administered 2011-12-03: 3 [IU] via SUBCUTANEOUS
  Administered 2011-12-04: 5 [IU] via SUBCUTANEOUS
  Administered 2011-12-04: 8 [IU] via SUBCUTANEOUS

## 2011-12-02 NOTE — Progress Notes (Signed)
Patient ID: Shelley [name], female   DOB: 10-Jan-1966, 46 y.o.   MRN: [id number] PGY-1 Daily Progress Note Family Medicine Teaching Service Aldine Contes. Marti Sleigh, MD Service Pager: [phone]   Subjective:  Patient continues to want to go home. Breathing is not much better today.   Desats overnight due to refusal to use CPAP. Continue daytime sleepiness.   Objective:  Temp:  [97.5 F (36.4 C)-98.3 F (36.8 C)] 97.5 F (36.4 C) (06/21 0436) Pulse Rate:  [91-103] 94  (06/21 0436) Resp:  [12-26] 15  (06/21 0436) BP: (115-153)/(78-91) 139/90 mmHg (06/21 0436) SpO2:  [85 %-97 %] 93 % (06/21 0522) FiO2 (%):  [40 %-50 %] 40 % (06/21 0647) Weight:  [289 lb 7.4 oz (131.3 kg)] 289 lb 7.4 oz (131.3 kg) (06/20 2346)  Intake/Output Summary (Last 24 hours) at 12/02/11 1478 Last data filed at 12/01/11 1700  Gross per 24 hour  Intake    243 ml  Output      0 ml  Net    243 ml    Physical Exam  Constitutional: She is oriented to person, place, and time. She appears well-developed and well-nourished.  On nebulizer this AM. Obese.  Mouth/Throat: MMM, did not reexamine ear.  Eyes: Conjunctivae are normal. Pupils are equal, round, and reactive to light.  Neck: Normal range of motion. Neck supple. No tracheal deviation present. No thyromegaly present.  Cardiovascular: Regular rhythm and intact distal pulses. Exam reveals no gallop and no friction rub.  No murmur heard. Slightly tachy to low 100s.  Respiratory: Respiratory slightly improved from yesterday. RR improved in mid teens. Still with diffuse wheeze and rhonchi. Able to speak in full sentences. Air movement improved from yesterday.   GI: Bowel sounds are normal. . There is no tenderness.  Musculoskeletal: Normal range of motion. Trace edema.   Neurological: She is alert and oriented to person, place, and time.  Skin: Skin is warm and dry.    Labs and imaging:   CBC  Lab 12/02/11 0350 11/29/11 2332 11/29/11 1446 11/29/11 1424  WBC 6.1  7.1 -- 7.2  HGB 10.8* 11.2* 13.9 --  HCT 37.2 36.0 41.0 --  PLT 249 248 -- 267   BMET/CMET  Lab 12/02/11 0350 12/01/11 0430 11/30/11 0410  NA 140 140 137  K 3.8 4.3 4.3  CL 93* 98 96  CO2 41* 36* 32  BUN 10 9 10   CREATININE 0.59 0.55 0.61  CALCIUM 9.6 8.9 8.4  PROT -- -- --  BILITOT -- -- --  ALKPHOS -- -- --  ALT -- -- --  AST -- -- --  GLUCOSE 191* 219* 246*    No results found.   Assessment  46 year old female with history of severe persistent asthma presenting with asthma exacerbation and RML pneumonia.   1. Shortness of breath-patient with symptoms over the last 4-5 days concerning for developing pneumonia vs. exacerbation of asthma or combination of both. BNP not elevated.  Well's criteria only 1.5 for HR and patient on q2/q1 albuterol.   Asthma, persistent with acute exacerbation-poor control at baseline with albuterol use twice a day. Flare could have been triggered by a virus which laid groundwork for developing pneumonia as well.   -patient slightly improved even with transition to q4 duonebs/q2 albuterol. Patient with desats overnight due to refusal to use CPAP given OSA and obesity hypoventilation.    -will repeat CXR today due to overall minimal improvement and continued o2 requirement but once again  believe this is due to OSA and obesity hypoventilation   -bicarb increased to 41 from 36 today-continue to monitor. Probable baseline obesity hypoventilation. Also with OSA, poor posture contributing.    -due to continued issues with desats due to OSA and obesity hypoventilation, will consult Pulmonary.   -have STRONGLY encouraged cpap to patient.    -due to desats, will not be able to move out of stepdown at this time.      -prednisone 50mg  x 5 days   -q4 duonebs  -continue oxygen with continuous pulse ox to monitor.   -CPAP ordered due to OSA but patient not using at home   -only controller at home is Singulair. Started inhaled corticosteroid  -respiratory to  provide pulmonary toilet-IS specifically ordered  Pneumonia-patient afebrile and does not have WBC. Developing PNA on CXR with hypoxia. Subjective fevers at home along with increasing sputum production. Think main contributor is asthma but will treat for community acquired pneumonia requiring hospitalization with CTX and azithromycin.   -repeat CXR to see if we need to alter coverage.   2. DM-only on metformin at home. A1c 6.8. Advanced to moderate SSI due to several CBGs over 200. Hold home metformin.  3. HTN-moderate control. Will restart home arb. Had already restarted hctz.    4. Hypertriglyceridemia-continue home fenofibrate. Stable.  5. Anemia-slightly worsened, will get FOBT.    FEN/GI-carb modified.  SLIV  repleted hypokalemia at time of admission-stable.  PPx-heparin SQ  DIsposition-pending improvement in respiratory status, transition to q4/q2, improvement in hypoxemia. SW consulted as will return to ALF.    Tana Conch, MD PGY1, Family Medicine Teaching Service [phone] 12/02/2011 7:12 AM

## 2011-12-02 NOTE — Progress Notes (Addendum)
CRITICAL VALUE ALERT  Critical value received:  Blood Gas (CO2 84)  Date of notification:  12/02/11  Time of notification:  1500  Critical value read back:yes  Nurse who received alert:  Morrie Sheldon RN  MD notified (1st page):  RT notified MD Molli Knock)  Time of first page:    MD notified (2nd page):  Time of second page:  Responding MD:   Time MD responded:

## 2011-12-02 NOTE — Progress Notes (Signed)
CRITICAL VALUE ALERT  Critical value received:  CO2: 41  Date of notification:  12/02/2011  Time of notification:  0550  Critical value read back:yes  Nurse who received alert:  Fransisco Hertz, RN  MD notified (1st page):  Dr. Ashley Royalty  Time of first page:  0605  MD notified (2nd page):  Time of second page:  Responding MD:  Dr. Ashley Royalty  Time MD responded:  [phone]

## 2011-12-02 NOTE — Progress Notes (Addendum)
CRITICAL VALUE ALERT  Critical value received:  Blood gas (CO2 91.5)  Date of notification:  12/02/11  Time of notification: 1307  Critical value read back:yes  Nurse who received alert:  Morrie Sheldon, RN  MD notified (1st page):  In person-Steve Minor, NP  Time of first page: 1310  MD notified (2nd page):  Time of second page:  Responding MD:  Devra Dopp, NP  Time MD responded: 1310

## 2011-12-02 NOTE — Progress Notes (Signed)
RT entered pts room, nurse had taken pt off bipap to eat. Pt now a DNR.

## 2011-12-02 NOTE — Progress Notes (Signed)
PT finally agreed to try the CPAP machine to get some rest. PT kept de-satting while awake so placed on CPAP auto mode with 10 lpm o2 bleed in. PT is currently 96%. RT will continue to monitor.

## 2011-12-02 NOTE — Progress Notes (Signed)
Discussed at length with pt and friend (HCPOA) at bedside.  Pt does seem improved with bipap but should she worsen, she has expressed that she would not want intubation, mech ventilation, CPR, etc.  She would want to be made comfortable.  She wishes to cont with bipap for now and all other medical interventions short of life support.  Friend at bedside and agrees.  Will proceed with order for DNR and cont all other orders for now.    Danford Bad, NP 12/02/2011  4:16 PM Pager: [phone]

## 2011-12-02 NOTE — Progress Notes (Signed)
FMTS Attending Daily Note: Shelley Levy MD [phone] pager office [phone] I  have seen and examined this patient, reviewed their chart. I have discussed this patient with the resident. I agree with the resident's findings, assessment and care plan. Her asthma symptoms improving---I think hampered by her very likely dx of obesity hypoventilation syndrome. She has repeatedly refused CPAP. Will ask pulmonary to make recommendations re this as I think this is a huge issue for her. Sleep study is evidently done too long ago for her even to qualify for CPAP--I do not have those records. By report, her aunt (her HCPOA) now has alzheimers---her cousin is attempting to become HCPOA for both. Patient and aunt live in same residence with complex but apparently fairly copmprehensive home health coverage.

## 2011-12-02 NOTE — Progress Notes (Signed)
Pt has tried the CPAP machine with the nasal mask tonight and is still refusing it. Pt states the mask feels "stuffy". PT is currently on 5 lpm nasal cannula, in no respiratory distress, with o2 sats 97%. RT will continue to monitor.

## 2011-12-02 NOTE — Consult Note (Signed)
Name: Shelley [name] MRN: [id number] DOB: 12-Aug-1965    LOS: 3  Referring Provider:  Teaching Service Reason for Referral:  Lethargy, OSA, , ? OHS, purulent sputum, cough.  PULMONARY / CRITICAL CARE MEDICINE  HPI: 46 yo MO WF , OSA with cpap , hx of asthma, admitted 6/18 with cough, subjective fevers and found to be hypoxic on admit. PCCM called for evaluation 6/21. She was sitting in chair and was near impossible to arouse. Abg's ordered. CxR with ? pna.  Past Medical History  Diagnosis Date  . Asthma   . Diabetes mellitus   . Hypertension   . Hyperlipidemia   . Pneumonia   . Anxiety   . GERD (gastroesophageal reflux disease)   . Arthritis   . Mental retardation    Past Surgical History  Procedure Date  . Cholecystectomy     2006   Prior to Admission medications   Medication Sig Start Date End Date Taking? Authorizing Provider  albuterol (PROVENTIL HFA;VENTOLIN HFA) 108 (90 BASE) MCG/ACT inhaler Inhale 2 puffs into the lungs every 6 (six) hours as needed. For shortness of breath.   Yes Historical Provider, MD  docusate sodium (COLACE) 100 MG capsule Take 100 mg by mouth daily.   Yes Historical Provider, MD  ergocalciferol (VITAMIN D2) 50000 UNITS capsule Take 50,000 Units by mouth once a week.   Yes Historical Provider, MD  fenofibrate 160 MG tablet Take 160 mg by mouth daily.   Yes Historical Provider, MD  loratadine (CLARITIN) 10 MG tablet Take 10 mg by mouth daily as needed. For allergies   Yes Historical Provider, MD  losartan-hydrochlorothiazide (HYZAAR) 100-25 MG per tablet Take 1 tablet by mouth daily.   Yes Historical Provider, MD  metFORMIN (GLUCOPHAGE) 1000 MG tablet Take 1,000 mg by mouth daily with breakfast.   Yes Historical Provider, MD  montelukast (SINGULAIR) 10 MG tablet Take 10 mg by mouth at bedtime.   Yes Historical Provider, MD   Allergies Allergies  Allergen Reactions  . Fish Allergy Swelling  . Pepto-Bismol (Bismuth Subsalicylate) Nausea And  Vomiting  . Cortisone Itching and Rash    Injections and cream only.  PO ok.    Family History Family History  Problem Relation Age of Onset  . Family history unknown: Yes   Social History  reports that she has never smoked. She has never used smokeless tobacco. She reports that she does not drink alcohol or use illicit drugs.  Review Of Systems:  na  Brief patient description:  MOWF obtunded.  Events Since Admission:   Current Status: Obtunded. Vital Signs: Temp:  [97.5 F (36.4 C)-98.5 F (36.9 C)] 98.5 F (36.9 C) (06/21 1222) Pulse Rate:  [91-103] 92  (06/21 1222) Resp:  [12-23] 17  (06/21 1222) BP: (108-153)/(67-90) 108/67 mmHg (06/21 1222) SpO2:  [71 %-99 %] 96 % (06/21 1222) FiO2 (%):  [40 %-50 %] 50 % (06/21 0803) Weight:  [289 lb 7.4 oz (131.3 kg)] 289 lb 7.4 oz (131.3 kg) (06/20 2346)  Physical Examination General:  MOWF obtunded Neuro:  Arouses to noxious stimuli HEENT: no adenopathy Neck:  No jvd Cardiovascular:  hsr rrr Lungs:  Decreased ai rmovement Abdomen:  obses + bs Musculoskeletal:  intact Skin:  warm  Active Problems:  Developmental delay  Obstructive sleep apnea  Obesity hypoventilation syndrome   ASSESSMENT AND PLAN  PULMONARY No results found for this basename: PHART:5,PCO2:5,PCO2ART:5,PO2ART:5,HCO3:5,O2SAT:5 in the last 168 hours Ventilator Settings: Vent Mode:  [-]  FiO2 (%):  [40 %-  50 %] 50 % CXR:   Dg Chest 2 View  12/02/2011  *RADIOLOGY REPORT*  Clinical Data: Hypoxia, cough, shortness of breath  CHEST - 2 VIEW  Comparison: 11/29/2011  Findings: Cardiomediastinal silhouette is stable.  Central mild bronchitic changes are noted.  Again noted right infrahilar airspace disease suspicious for infiltrate.  Streaky left basilar atelectasis or infiltrate.  No pulmonary edema.  IMPRESSION: Central mild bronchitic changes are noted.  Again noted right infrahilar airspace disease suspicious for infiltrate.  Streaky left basilar atelectasis  or infiltrate.  No pulmonary edema.  Original Report Authenticated By: Natasha Mead, M.D.    ETT:    A:  Presumed Pna in setting of MO(291 lbs), osa and suspected OHS. Difficult to arouse and not on sedating medications consistent with increased co2. P:   --Change to bipap  and possibly OTT/MVS. - check abg for Co2 retention  CARDIOVASCULAR  Lab 11/29/11 2332  TROPONINI --  LATICACIDVEN --  PROBNP 32.7   ECG:  Lines:   A: HTN P:  Per TS  RENAL  Lab 12/02/11 0350 12/01/11 0430 11/30/11 0410 11/29/11 2332 11/29/11 1446  NA 140 140 137 -- 139  K 3.8 4.3 -- -- --  CL 93* 98 96 -- 92*  CO2 41* 36* 32 -- --  BUN 10 9 10  -- 15  CREATININE 0.59 0.55 0.61 0.65 0.80  CALCIUM 9.6 8.9 8.4 -- --  MG -- -- -- -- --  PHOS -- -- -- -- --   Intake/Output      06/20 0701 - 06/21 0700 06/21 0701 - 06/22 0700   P.O. 240 240   I.V. (mL/kg) 3 (0)    Total Intake(mL/kg) 243 (1.9) 240 (1.8)   Urine (mL/kg/hr)     Total Output     Net +243 +240        Urine Occurrence 651 x 200 x   Stool Occurrence 2 x     Foley:    A:  No acute issue  P:     GASTROINTESTINAL No results found for this basename: AST:5,ALT:5,ALKPHOS:5,BILITOT:5,PROT:5,ALBUMIN:5 in the last 168 hours  A:  Obesity P:   -wt loss  HEMATOLOGIC  Lab 12/02/11 0350 11/29/11 2332 11/29/11 1446 11/29/11 1424  HGB 10.8* 11.2* 13.9 12.5  HCT 37.2 36.0 41.0 39.1  PLT 249 248 -- 267  INR -- -- -- --  APTT -- -- -- --   A:  Anemia P:  Per TS  INFECTIOUS  Lab 12/02/11 0350 11/29/11 2332 11/29/11 1424  WBC 6.1 7.1 7.2  PROCALCITON -- -- --   Cultures: none Antibiotics: 6/19 roc>> 6/19 zithro>>  A: presumed pna P:   See flows  ENDOCRINE  Lab 12/02/11 1222 12/02/11 0746 12/01/11 2149 12/01/11 1641 12/01/11 1205  GLUCAP 248* 199* 158* 225* 181*   A:  DM   P:   -per TS, note on steroids  NEUROLOGIC  A:  Obtunded P:   -may need intubation.  BEST PRACTICE / DISPOSITION Level of Care:   sdu Primary Service:  Teaching service Consultants:  pccm Code Status:  full Diet:  Low carb DVT Px:  heparin GI Px:  ppi Skin Integrity:  intact Social / Family:  None at bedside  [facility] Minor ACNP Adolph Pollack PCCM Pager [phone] till 3 pm If no answer page [phone] 12/02/2011, 1:07 PM  Patient in acute on chronic respiratory failure, mental status is questionable but patient is easily arousable.  Will attempt BiPAP as ordered and recheck  ABG.  If CO2 is not improved then will likely need ET tube placement.  The ultimate fix here is a tracheostomy but more importantly I think code status needs to be established.  If CO2 is not improved with BiPAP will transfer to ICU and intubate.  CC time 45 minutes.  Patient seen and examined, agree with above note.  I dictated the care and orders written for this patient under my direction.  Koren Bound, M.D. [phone]

## 2011-12-03 LAB — GLUCOSE, CAPILLARY
Glucose-Capillary: 154 mg/dL — ABNORMAL HIGH (ref 70–99)
Glucose-Capillary: 229 mg/dL — ABNORMAL HIGH (ref 70–99)

## 2011-12-03 LAB — CBC
Hemoglobin: 11 g/dL — ABNORMAL LOW (ref 12.0–15.0)
MCH: 26.8 pg (ref 26.0–34.0)
Platelets: 242 10*3/uL (ref 150–400)
RBC: 4.11 MIL/uL (ref 3.87–5.11)

## 2011-12-03 LAB — BASIC METABOLIC PANEL
Calcium: 9.8 mg/dL (ref 8.4–10.5)
GFR calc Af Amer: >90 mL/min (ref >90)
GFR calc non Af Amer: >90 mL/min (ref >90)
Glucose, Bld: 163 mg/dL — ABNORMAL HIGH (ref 70–99)
Potassium: 3.8 mEq/L (ref 3.5–5.1)
Sodium: 141 mEq/L (ref 135–145)

## 2011-12-03 LAB — MAGNESIUM: Magnesium: 1.9 mg/dL (ref 1.5–2.5)

## 2011-12-03 LAB — PHOSPHORUS: Phosphorus: 2.6 mg/dL (ref 2.3–4.6)

## 2011-12-03 MED ORDER — PANTOPRAZOLE SODIUM 40 MG PO TBEC
40.0000 mg | DELAYED_RELEASE_TABLET | Freq: Two times a day (BID) | ORAL | Status: DC
  Administered 2011-12-03 – 2011-12-06 (×6): 40 mg via ORAL
  Filled 2011-12-03 (×6): qty 1

## 2011-12-03 MED ORDER — METHYLPREDNISOLONE SODIUM SUCC 125 MG IJ SOLR
80.0000 mg | Freq: Three times a day (TID) | INTRAMUSCULAR | Status: DC
  Administered 2011-12-03 – 2011-12-05 (×6): 80 mg via INTRAVENOUS
  Filled 2011-12-03 (×7): qty 1.28
  Filled 2011-12-03: qty 2
  Filled 2011-12-03: qty 1.28
  Filled 2011-12-03: qty 2

## 2011-12-03 NOTE — Progress Notes (Signed)
Patient ID: Shelley [name], female   DOB: Oct 01, 1965, 46 y.o.   MRN: [id number] PGY-1 Daily Progress Note Family Medicine Teaching Service Aldine Contes. Marti Sleigh, MD Service Pager: [phone]   Subjective:  Patient sleeping this morning but able to awaken to answer questions.   Objective:  Temp:  [97 F (36.1 C)-98.5 F (36.9 C)] 97.8 F (36.6 C) (06/22 0751) Pulse Rate:  [72-98] 87  (06/22 0751) Resp:  [10-18] 18  (06/22 0751) BP: (65-135)/(50-95) 98/54 mmHg (06/22 0751) SpO2:  [95 %-100 %] 100 % (06/22 0807) FiO2 (%):  [40 %] 40 % (06/22 0807)  Intake/Output Summary (Last 24 hours) at 12/03/11 0942 Last data filed at 12/02/11 2245  Gross per 24 hour  Intake    127 ml  Output      0 ml  Net    127 ml    Physical Exam  Constitutional: NAD, resting in bed with BIPAP in place.   Cardiovascular: Regular rate andrhythm and intact distal pulses. Exam reveals no gallop and no friction rub.  No murmur heard.  Respiratory: Diffuse occasional wheeze but much improved overall. Moving air but will not participate with deep inspiration.   GI: Bowel sounds are normal. . There is no tenderness.  Ext: Normal range of motion. Trace edema.     Labs and imaging:   CBC  Lab 12/03/11 0450 12/02/11 0350 11/29/11 2332  WBC 6.9 6.1 7.1  HGB 11.0* 10.8* 11.2*  HCT 37.5 37.2 36.0  PLT 242 249 248   BMET/CMET  Lab 12/03/11 0450 12/02/11 0350 12/01/11 0430  NA 141 140 140  K 3.8 3.8 4.3  CL 92* 93* 98  CO2 43* 41* 36*  BUN 15 10 9   CREATININE 0.75 0.59 0.55  CALCIUM 9.8 9.6 8.9  PROT -- -- --  BILITOT -- -- --  ALKPHOS -- -- --  ALT -- -- --  AST -- -- --  GLUCOSE 163* 191* 219*   BLOOD GAS, ARTERIAL     Status: Abnormal   Collection Time   12/02/11  1:07 PM      Component Value Range   O2 Content 4.0     Delivery systems NASAL CANNULA     pH, Arterial 7.312 (*) 7.350 - 7.400   pCO2 arterial 91.5 (*) 35.0 - 45.0 mmHg   pO2, Arterial 94.1  80.0 - 100.0 mmHg   Bicarbonate 44.9  (*) 20.0 - 24.0 mEq/L   TCO2 47.7  0 - 100 mmol/L   Acid-Base Excess 17.9 (*) 0.0 - 2.0 mmol/L   O2 Saturation 98.4     Patient temperature 98.6     Collection site RIGHT RADIAL     Drawn by [phone]     Sample type ARTERIAL DRAW     Allens test (pass/fail) PASS  PASS  BLOOD GAS, ARTERIAL     Status: Abnormal   Collection Time   12/02/11  3:00 PM      Component Value Range   FIO2 0.40     Delivery systems BILEVEL POSITIVE AIRWAY PRESSURE     Inspiratory PAP 20.0     Expiratory PAP 8.0     pH, Arterial 7.353  7.350 - 7.400   pCO2 arterial 84.0 (*) 35.0 - 45.0 mmHg   pO2, Arterial 110.0 (*) 80.0 - 100.0 mmHg   Bicarbonate 45.5 (*) 20.0 - 24.0 mEq/L   TCO2 48.1  0 - 100 mmol/L   Acid-Base Excess 19.0 (*) 0.0 - 2.0 mmol/L  O2 Saturation 99.0     Patient temperature 98.6     Collection site RIGHT RADIAL     Drawn by [phone]     Sample type ARTERIAL DRAW     Allens test (pass/fail) PASS  PASS  OCCULT BLOOD X 1 CARD TO LAB, STOOL     Status: Normal   Collection Time   12/02/11  5:12 PM      Component Value Range   Fecal Occult Bld NEGATIVE      Dg Chest 2 View  12/02/2011  *RADIOLOGY REPORT*  Clinical Data: Hypoxia, cough, shortness of breath  CHEST - 2 VIEW  Comparison: 11/29/2011  Findings: Cardiomediastinal silhouette is stable.  Central mild bronchitic changes are noted.  Again noted right infrahilar airspace disease suspicious for infiltrate.  Streaky left basilar atelectasis or infiltrate.  No pulmonary edema.  IMPRESSION: Central mild bronchitic changes are noted.  Again noted right infrahilar airspace disease suspicious for infiltrate.  Streaky left basilar atelectasis or infiltrate.  No pulmonary edema.  Original Report Authenticated By: Natasha Mead, M.D.     Assessment  46 year old female with history of severe persistent asthma presenting with asthma exacerbation and RML pneumonia.   1. Hypercarbia/respiratortory acidosis-improving on Bipap both by ABG and by patient's  ability to awaken.  Patient is DNR/DNI-see pulm note. Will monitor for continued improvement on Bipap. ABGs per pulmonary at this time given clinical improvement. Think this is likely due to OSA not using CPAP along with decreased reserve from PNA/asthma exacerbation as well as probable baseline obesity hypoventilation.   -from clinical perspective, obtunded state improving.   -Pulm/CCM assisting with management. Will follow up recommendations. Acetazolamide and lasix ordered per pulm yesterday.   .-Bicarb on BMET continues to increase, will monitor daily.   -BIPAP only due to  DNR status. Will attempt to wean today per CCM  -TSH to complete workup per CCM.    2. Asthma, persistent with acute exacerbation-poor control at baseline with albuterol use twice a day. Flare could have been triggered by a virus which laid groundwork for developing pneumonia as well.   -patient wheezing continues to improve. No desats overnight.   -q4/q2 albuterol. No atrovent per CCM      -transitioned to solumedrol per CCM  -on bipap  -only controller at home is Singulair. Started inhaled corticosteroid  -respiratory to provide pulmonary toilet-IS specifically ordered  3. Pneumonia-patient afebrile and does not have WBC. Developing PNA on CXR with hypoxia. Subjective fevers at home along with increasing sputum production. Think main contributor is asthma but will treat for community acquired pneumonia requiring hospitalization with CTX and azithromycin.   4. HTN-most recent BP 98/54. Will hold home arb/hctz once again. Will avoid beta blocker and ace-i per CCM.   #. DM-only on metformin at home. A1c 6.8.  moderate SSI with moderate control due to steroids.      #. Hypertriglyceridemia-continue home fenofibrate. Stable.  #. Anemia-Stable, FOBT negative, will need otupatient follow up.    FEN/GI-carb modified.  SLIV. May need IVF if PO decreases, monitor PPx-heparin SQ, PPI DIsposition-pending improvement in  respiratory status. Lives in independent living facility with aunt who has dementia. May need SNF when able to leave. SDU due to BIPAP.  Code status: DNR   Tana Conch, MD PGY1, Family Medicine Teaching Service [phone] 12/03/2011 9:42 AM

## 2011-12-03 NOTE — Progress Notes (Signed)
CRITICAL VALUE ALERT  Critical value received:  Venous CO2 43 Date of notification:  6/22/  Time of notification:  0540  Critical value read back: yes  Nurse who received alert: Jeanmarie Plant RN  MD notified (1st page):  Dr. Luan Pulling  Time of first page: 0543  MD notified (2nd page):  Time of second page:  Responding MD: Dr. Luan Pulling  Time MD responded: [phone]

## 2011-12-03 NOTE — Progress Notes (Signed)
Name: Shelley [name] MRN: [id number] DOB: 12-13-1965    LOS: 4  Referring Provider:  Teaching Service Reason for Referral:  Lethargy, OSA, , ? OHS, purulent sputum, cough.  PULMONARY / CRITICAL CARE MEDICINE  HPI: 46 yo [place] WF , OSA with cpap , hx of asthma, admitted 6/18 with cough, subjective fevers and found to be hypoxic on admit. PCCM called for evaluation 6/21 with ams/hypercarbia > responded to bipap.   Current Status: Sitting up in bed, mild increased wob   Vital Signs: Temp:  [97 F (36.1 C)-98.5 F (36.9 C)] 97.8 F (36.6 C) (06/22 0751) Pulse Rate:  [72-98] 87  (06/22 0751) Resp:  [10-18] 18  (06/22 0751) BP: (65-135)/(50-95) 98/54 mmHg (06/22 0751) SpO2:  [95 %-100 %] 100 % (06/22 0807) FiO2 (%):  [40 %] 40 % (06/22 0807) Recorded on bipap but tol NP     Physical Examination General:  Obese, child like affect, nad Neuro:  Alert and approp HEENT: no adenopathy Neck:  No jvd Cardiovascular:  hsr rrr Lungs:  Pan exp rhonchi bilaterally Abdomen:  obses + bs Musculoskeletal:  intact Skin:  warm  Active Problems:  Developmental delay  Obstructive sleep apnea  Obesity hypoventilation syndrome  Acute and chronic respiratory failure  Acute respiratory failure  Hypoxemia  Pneumonia   ASSESSMENT AND PLAN  PULMONARY  Lab 12/02/11 1500 12/02/11 1307  PHART 7.353 7.312*  PCO2ART 84.0* 91.5*  PO2ART 110.0* 94.1  HCO3 45.5* 44.9*  O2SAT 99.0 98.4   Ventilator Settings: Vent Mode:  [-]  FiO2 (%):  [40 %] 40 % CXR:   Dg Chest 2 View  12/02/2011  *RADIOLOGY REPORT*  Clinical Data: Hypoxia, cough, shortness of breath  CHEST - 2 VIEW  Comparison: 11/29/2011  Findings: Cardiomediastinal silhouette is stable.  Central mild bronchitic changes are noted.  Again noted right infrahilar airspace disease suspicious for infiltrate.  Streaky left basilar atelectasis or infiltrate.  No pulmonary edema.  IMPRESSION: Central mild bronchitic changes are noted.  Again noted  right infrahilar airspace disease suspicious for infiltrate.  Streaky left basilar atelectasis or infiltrate.  No pulmonary edema.  Original Report Authenticated By: Natasha Mead, M.D.      A:  Presumed Pna in setting of MO(291 lbs), asthma, osa and suspected OHS (elevated baseline hco3 virtually diagnostic in this setting) > hypercarbic RF / acute on chronic --Improved 6/22 > bipap prn. - Rx airways aggressively with IV Steroids, no atrovent, max gerd rx - TSH 6/23 >>>  CARDIOVASCULAR  Lab 11/29/11 2332  TROPONINI --  LATICACIDVEN --  PROBNP 32.7      A: HTN/ no evidence chf P:  Per TS - avoid acei and non-specific beta blockers in this setting  RENAL  Lab 12/03/11 0450 12/02/11 0350 12/01/11 0430 11/30/11 0410 11/29/11 2332 11/29/11 1446  NA 141 140 140 137 -- 139  K 3.8 3.8 -- -- -- --  CL 92* 93* 98 96 -- 92*  CO2 43* 41* 36* 32 -- --  BUN 15 10 9 10  -- 15  CREATININE 0.75 0.59 0.55 0.61 0.65 --  CALCIUM 9.8 9.6 8.9 8.4 -- --  MG 1.9 -- -- -- -- --  PHOS 2.6 -- -- -- -- --   Intake/Output      06/21 0701 - 06/22 0700 06/22 0701 - 06/23 0700   P.O. 360    I.V. (mL/kg) 3 (0)    IV Piggyback 4    Total Intake(mL/kg) 367 (2.8)  Net +367         Urine Occurrence 3635 x    Stool Occurrence 1 x     Foley:    A:  No acute issues     GASTROINTESTINAL No results found for this basename: AST:5,ALT:5,ALKPHOS:5,BILITOT:5,PROT:5,ALBUMIN:5 in the last 168 hours  A:  Obesity P:   -wt loss  HEMATOLOGIC  Lab 12/03/11 0450 12/02/11 0350 11/29/11 2332 11/29/11 1446 11/29/11 1424  HGB 11.0* 10.8* 11.2* 13.9 12.5  HCT 37.5 37.2 36.0 41.0 39.1  PLT 242 249 248 -- 267  INR -- -- -- -- --  APTT -- -- -- -- --   A:  Anemia P:  Per TS   INFECTIOUS  Lab 12/03/11 0450 12/02/11 0350 11/29/11 2332 11/29/11 1424  WBC 6.9 6.1 7.1 7.2  PROCALCITON -- -- -- --   Cultures: None  Antibiotics: 6/18 roc>> 6/19 zithro x 5 doses total  A: presumed CAP P:   See  flows  ENDOCRINE  Lab 12/03/11 0755 12/02/11 2209 12/02/11 1741 12/02/11 1222 12/02/11 0746  GLUCAP 154* 117* 206* 248* 199*   A:  DM   P:   -per TS, note on steroids  NEUROLOGIC  A:  Obtunded P: resolved      BEST PRACTICE / DISPOSITION Level of Care:  sdu Primary Service:  Teaching service Consultants:  pccm Code Status:  full Diet:  Low carb DVT Px:  heparin GI Px:  ppi Skin Integrity:  intact Social / Family:  None at bedside  Hopefully can wean off bipap and simplify her care - needs tsh to complete w/u  Sandrea Hughs, MD Pulmonary and Critical Care Medicine [facility] Healthcare Cell [phone]

## 2011-12-04 LAB — BASIC METABOLIC PANEL
BUN: 18 mg/dL (ref 6–23)
Chloride: 94 mEq/L — ABNORMAL LOW (ref 96–112)
Creatinine, Ser: 0.67 mg/dL (ref 0.50–1.10)
Glucose, Bld: 252 mg/dL — ABNORMAL HIGH (ref 70–99)
Potassium: 4.1 mEq/L (ref 3.5–5.1)

## 2011-12-04 LAB — GLUCOSE, CAPILLARY: Glucose-Capillary: 269 mg/dL — ABNORMAL HIGH (ref 70–99)

## 2011-12-04 LAB — OCCULT BLOOD X 1 CARD TO LAB, STOOL: Fecal Occult Bld: POSITIVE

## 2011-12-04 MED ORDER — INSULIN ASPART 100 UNIT/ML ~~LOC~~ SOLN
0.0000 [IU] | Freq: Three times a day (TID) | SUBCUTANEOUS | Status: DC
  Administered 2011-12-04 – 2011-12-05 (×2): 11 [IU] via SUBCUTANEOUS
  Administered 2011-12-05 (×2): 7 [IU] via SUBCUTANEOUS
  Administered 2011-12-06: 4 [IU] via SUBCUTANEOUS

## 2011-12-04 MED ORDER — INSULIN GLARGINE 100 UNIT/ML ~~LOC~~ SOLN
10.0000 [IU] | Freq: Every day | SUBCUTANEOUS | Status: DC
  Administered 2011-12-04 – 2011-12-06 (×2): 10 [IU] via SUBCUTANEOUS

## 2011-12-04 NOTE — Progress Notes (Signed)
FMTS Attending Daily Note: Denny Levy MD [phone] pager office [phone] I  have seen and examined this patient, reviewed their chart. I have discussed this patient with the resident. I agree with the resident's findings, assessment and care plan. Much improved. I think bi pap at night will be very beneficial for her. Oxygen [place] during day.Her cousin Onalee Hua is now HCPOA. Dr Konrad Dolores to touch base with him today about d/c planning---potentially tomorrow. Her asthma / pneumonia is greatly improved.

## 2011-12-04 NOTE — Progress Notes (Signed)
Patient ID: Shelley [name], female   DOB: 09-09-1965, 46 y.o.   MRN: [id number] PGY-3 Daily Progress Note Family Medicine Teaching Service   Subjective: Patient feels better, and is "almost" back to normal. She was awake and talked to me. (child-like is appropriate description) She has no specific complaints.   Objective:  Temp:  [97.9 F (36.6 C)-98.5 F (36.9 C)] 98.3 F (36.8 C) (06/23 0746) Pulse Rate:  [88-99] 90  (06/23 0900) Resp:  [15-21] 17  (06/23 0900) BP: (114-124)/(66-94) 116/69 mmHg (06/23 0746) SpO2:  [91 %-99 %] 98 % (06/23 0900) Weight:  [282 lb 3 oz (128 kg)] 282 lb 3 oz (128 kg) (06/23 0453)  Intake/Output Summary (Last 24 hours) at 12/04/11 0946 Last data filed at 12/04/11 0130  Gross per 24 hour  Intake    603 ml  Output   1051 ml  Net   -448 ml   Physical Exam  Constitutional: NAD, resting in bed with nasal cannula.    Cardiovascular: Regular rate and rhythm and intact distal pulses. Exam reveals no gallop and no friction rub.  No murmur heard.  Lungs:  Normal respiratory effort, chest expands symmetrically. Lungs are clear to auscultation, no crackles or wheezes. Abdomen: soft and non-tender without masses, organomegaly or hernias noted.  No guarding or rebound Extremities:   Non-tender, No cyanosis, edema, or deformity noted. Knee brace right leg.   Labs and imaging:   CBC  Lab 12/03/11 0450 12/02/11 0350 11/29/11 2332  WBC 6.9 6.1 7.1  HGB 11.0* 10.8* 11.2*  HCT 37.5 37.2 36.0  PLT 242 249 248   BMET/CMET  Lab 12/04/11 0340 12/03/11 0450 12/02/11 0350  NA 139 141 140  K 4.1 3.8 3.8  CL 94* 92* 93*  CO2 37* 43* 41*  BUN 18 15 10   CREATININE 0.67 0.75 0.59  CALCIUM 10.1 9.8 9.6  PROT -- -- --  BILITOT -- -- --  ALKPHOS -- -- --  ALT -- -- --  AST -- -- --  GLUCOSE 252* 163* 191*   Dg Chest 2 View  12/02/2011  *RADIOLOGY REPORT*  Clinical Data: Hypoxia, cough, shortness of breath  CHEST - 2 VIEW  Comparison: 11/29/2011  Findings:  Cardiomediastinal silhouette is stable.  Central mild bronchitic changes are noted.  Again noted right infrahilar airspace disease suspicious for infiltrate.  Streaky left basilar atelectasis or infiltrate.  No pulmonary edema.  IMPRESSION: Central mild bronchitic changes are noted.  Again noted right infrahilar airspace disease suspicious for infiltrate.  Streaky left basilar atelectasis or infiltrate.  No pulmonary edema.  Original Report Authenticated By: Natasha Mead, M.D.   Assessment  46 year old female with mental retardation with asthma exacerbation and resolving RML pneumonia.   1. Hypercarbia/respiratortory acidosis-improving on Bipap both by ABG and by patient's ability to awaken.  Patient is DNR/DNI-see pulm note. Will monitor for continued improvement on Bipap. ABGs per pulmonary at this time given clinical improvement. Think this is likely due to OSA not using CPAP along with decreased reserve from PNA/asthma exacerbation as well as probable baseline obesity hypoventilation.     2. Asthma, persistent with acute exacerbation-poor control at baseline with albuterol use twice a day. Flare could have been triggered by a virus/PNA  -patient wheezing continues to improve. No desats overnight.   -q4/q2 albuterol. No atrovent per CCM      -transitioned to solumedrol per CCM  -on CPAP overnight.   -only controller at home is Singulair. Started inhaled corticosteroid  -  respiratory to provide pulmonary toilet-IS specifically ordered   - would keep patient in Stepdown because of mental status until she improves further.   3. Pneumonia-patient afebrile and does not have WBC. Developing PNA on CXR with hypoxia. Subjective fevers at home along with increasing sputum production. Think main contributor is asthma but will treat for community acquired pneumonia requiring hospitalization with CTX and azithromycin.   4. HTN-most recent BP 98/54. Will hold home arb/hctz once again. Will avoid beta blocker and  ace-i per CCM.   #. DM-only on metformin at home. A1c 6.8.  moderate SSI with moderate control due to steroids.      #. Hypertriglyceridemia-continue home fenofibrate. Stable.  #. Anemia-Stable, FOBT negative, will need otupatient follow up.    FEN/GI-carb modified.  SLIV. May need IVF if PO decreases, monitor PPx-heparin SQ, PPI DIsposition-pending improvement in respiratory status. Lives in independent living facility with aunt who has dementia. May need SNF when able to leave. SDU due to BIPAP.  Code status: DNR  Edd Arbour, MD 9:51 AM 12/04/2011

## 2011-12-04 NOTE — Progress Notes (Signed)
FMTS Attending Daily Note: Delane Stalling MD [phone] pager office [phone] I  have seen and examined this patient, reviewed their chart. I have discussed this patient with the resident. I agree with the resident's findings, assessment and care plan. 

## 2011-12-04 NOTE — Progress Notes (Signed)
Name: Shelley [name] MRN: [id number] DOB: 06/26/1965    LOS: 5  Referring Provider:  Teaching Service Reason for Referral:  Lethargy, OSA, , ? OHS, purulent sputum, cough.  PULMONARY / CRITICAL CARE MEDICINE  HPI: 46 yo [place] WF , OSA with cpap , hx of asthma, admitted 6/18 with cough, subjective fevers and found to be hypoxic on admit. PCCM called for evaluation 6/21 with ams/hypercarbia > responded to bipap.   Current Status: Sitting up in bed, much brighter affect- used cpap but not bipap last night   Vital Signs: Temp:  [98.2 F (36.8 C)-98.5 F (36.9 C)] 98.2 F (36.8 C) (06/23 1226) Pulse Rate:  [88-99] 97  (06/23 1226) Resp:  [15-21] 20  (06/23 1226) BP: (114-124)/(66-94) 114/69 mmHg (06/23 1226) SpO2:  [91 %-99 %] 98 % (06/23 1226) Weight:  [282 lb 3 oz (128 kg)] 282 lb 3 oz (128 kg) (06/23 0453) FIO2 3lpm    Physical Examination General:  Obese, child like affect, nad Neuro:  Alert and approp HEENT: no adenopathy Neck:  No jvd Cardiovascular:  hsr rrr Lungs:  Pan exp rhonchi bilaterally Abdomen:  obses + bs Musculoskeletal:  intact Skin:  warm  Principal Problem:  *Pneumonia Active Problems:  Developmental delay  Obstructive sleep apnea  Obesity hypoventilation syndrome  Acute and chronic respiratory failure  Acute respiratory failure  Hypoxemia   ASSESSMENT AND PLAN  PULMONARY  Lab 12/02/11 1500 12/02/11 1307  PHART 7.353 7.312*  PCO2ART 84.0* 91.5*  PO2ART 110.0* 94.1  HCO3 45.5* 44.9*  O2SAT 99.0 98.4      CXR:   No results found.    A:  Presumed Pna in setting of MO(291 lbs), asthma, osa and suspected OHS (elevated baseline hco3 virtually diagnostic in this setting) > hypercarbic RF / acute on chronic --Improved 6/22 > bipap prn. - Rx airways aggressively with IV Steroids, no atrovent, max gerd rx - TSH 6/23 > ok  CARDIOVASCULAR  Lab 11/29/11 2332  TROPONINI --  LATICACIDVEN --  PROBNP 32.7      A: HTN/ no evidence chf P:    Per TS - avoid acei and non-specific beta blockers in this setting  RENAL  Lab 12/04/11 0340 12/03/11 0450 12/02/11 0350 12/01/11 0430 11/30/11 0410  NA 139 141 140 140 137  K 4.1 3.8 -- -- --  CL 94* 92* 93* 98 96  CO2 37* 43* 41* 36* 32  BUN 18 15 10 9 10   CREATININE 0.67 0.75 0.59 0.55 0.61  CALCIUM 10.1 9.8 9.6 8.9 8.4  MG -- 1.9 -- -- --  PHOS -- 2.6 -- -- --   Intake/Output      06/22 0701 - 06/23 0700 06/23 0701 - 06/24 0700   P.O. 720 120   I.V. (mL/kg) 3 (0)    IV Piggyback     Total Intake(mL/kg) 723 (5.6) 120 (0.9)   Urine (mL/kg/hr) 1600 (0.5) 75 (0.1)   Stool 1    Total Output 1601 75   Net -878 +45        Urine Occurrence 1 x 1 x   Stool Occurrence 1 x       A:  No acute issues     GASTROINTESTINAL No results found for this basename: AST:5,ALT:5,ALKPHOS:5,BILITOT:5,PROT:5,ALBUMIN:5 in the last 168 hours  A:  Obesity P:   -wt loss  HEMATOLOGIC  Lab 12/03/11 0450 12/02/11 0350 11/29/11 2332 11/29/11 1446 11/29/11 1424  HGB 11.0* 10.8* 11.2* 13.9 12.5  HCT 37.5 37.2  36.0 41.0 39.1  PLT 242 249 248 -- 267  INR -- -- -- -- --  APTT -- -- -- -- --   A:  Anemia P:  Per TS   INFECTIOUS  Lab 12/03/11 0450 12/02/11 0350 11/29/11 2332 11/29/11 1424  WBC 6.9 6.1 7.1 7.2  PROCALCITON -- -- -- --   Cultures: None  Antibiotics: 6/18 roc>> 6/19 zithro x 5 doses total  A: presumed CAP P:   Empiric rx per primary service  ENDOCRINE  Lab 12/04/11 1229 12/04/11 0748 12/03/11 2114 12/03/11 1556 12/03/11 1221  GLUCAP 269* 234* 247* 284* 229*   A:  DM   P:   -per TS, note on steroids  NEUROLOGIC  A:  ams secondary to acute on chronic hypercarbia P: resolved      BEST PRACTICE / DISPOSITION Level of Care:  sdu Primary Service:  Teaching service Consultants:  pccm Code Status:  full Diet:  Low carb DVT Px:  heparin GI Px:  ppi Skin Integrity:  intact Social / Family:  None at bedside  Hopefully can wean stay off bipap and  continue to simplify her car   Sandrea Hughs, MD Pulmonary and Critical Care Medicine [facility] Healthcare Cell [phone]

## 2011-12-05 ENCOUNTER — Encounter (HOSPITAL_COMMUNITY): Payer: Self-pay | Admitting: Family Medicine

## 2011-12-05 LAB — GLUCOSE, CAPILLARY
Glucose-Capillary: 238 mg/dL — ABNORMAL HIGH (ref 70–99)
Glucose-Capillary: 261 mg/dL — ABNORMAL HIGH (ref 70–99)
Glucose-Capillary: 203 mg/dL — ABNORMAL HIGH (ref 70–99)

## 2011-12-05 MED ORDER — BUDESONIDE 0.25 MG/2ML IN SUSP
0.2500 mg | Freq: Two times a day (BID) | RESPIRATORY_TRACT | Status: DC
  Administered 2011-12-05 – 2011-12-06 (×2): 0.25 mg via RESPIRATORY_TRACT
  Filled 2011-12-05 (×6): qty 2

## 2011-12-05 MED ORDER — ALBUTEROL SULFATE (5 MG/ML) 0.5% IN NEBU
2.5000 mg | INHALATION_SOLUTION | Freq: Four times a day (QID) | RESPIRATORY_TRACT | Status: DC
  Administered 2011-12-05 – 2011-12-06 (×7): 2.5 mg via RESPIRATORY_TRACT
  Filled 2011-12-05 (×7): qty 0.5

## 2011-12-05 MED ORDER — PREDNISONE 50 MG PO TABS
50.0000 mg | ORAL_TABLET | Freq: Every day | ORAL | Status: DC
  Administered 2011-12-05 – 2011-12-06 (×2): 50 mg via ORAL
  Filled 2011-12-05 (×3): qty 1

## 2011-12-05 NOTE — Evaluation (Signed)
Occupational Therapy Evaluation Patient Details Name: Shelley [name] MRN: [id number] DOB: Jan 17, 1966 Today's Date: 12/05/2011 Time: 4098-1191 OT Time Calculation (min): 18 min  OT Assessment / Plan / Recommendation Clinical Impression  46 yo female admitted for PNA and asthma found to be in hypoxic state. PT at or near baseline and does not require skilled OT acutely. OT to sign off    OT Assessment  Patient does not need any further OT services    Follow Up Recommendations  No OT follow up    Barriers to Discharge      Equipment Recommendations  None recommended by PT    Recommendations for Other Services    Frequency       Precautions / Restrictions Precautions Precautions: None Precaution Comments: oxygen Restrictions Weight Bearing Restrictions: No   Pertinent Vitals/Pain Oxygen decreased with activity to 79% on 2 L RA 75%  2L sitting 95%    ADL  Grooming: Simulated;Wash/dry hands;Wash/dry face;Modified independent Where Assessed - Grooming: Unsupported standing Lower Body Dressing: Performed;Modified independent Where Assessed - Lower Body Dressing: Unsupported sitting (able to cross BIL LE) Toilet Transfer: Simulated;Modified independent Toilet Transfer Method: Sit to Barista: Regular height toilet Equipment Used: Gait belt;Other (comment) (oxygen tank) Transfers/Ambulation Related to ADLs: Pt ambulated hall way 400 ft with oxygen. pt demonstrates decreased saturation into 70's on Room air and 79% on 2 L oxygen without restbreaks ADL Comments: Pt is at or near baseline for all ADL needs. Pt with a child like tone to voice and seeking positive reinforcement throughout session. "Am i doing it right?" "Am I okay?" Pt will not require skilled OT at this time.       OT Goals    Visit Information  Last OT Received On: 12/05/11 Assistance Needed: +1 PT/OT Co-Evaluation/Treatment: Yes    Subjective Data  Subjective: "walk around the  building" Patient Stated Goal: to return to [place]   Prior Functioning  Home Living Lives With: Other (Comment) (lives with friend) Available Help at Discharge: Other (Comment) (RN (A) 9:30-2pm - someone to stay over night) Type of Home: Apartment Home Access: Level entry Home Layout: One level Bathroom Shower/Tub: Walk-in shower;Door [employer] Toilet: Standard Home Adaptive Equipment: Shower chair with back;Bedside commode/3-in-1 Additional Comments: wears a Lt knee brace Prior Function Level of Independence: Independent;Needs assistance Needs Assistance: Meal Prep Meal Prep: Total (eats in dining hall and can be brought to the room) Able to Take Stairs?: No Driving: No Comments: has a poodle named Tree surgeon Communication: No difficulties (child like tone to voice) Dominant Hand: Right    Cognition  Overall Cognitive Status: History of cognitive impairments - at baseline Arousal/Alertness: Awake/alert Orientation Level: Appears intact for tasks assessed Behavior During Session: [facility] for tasks performed    Extremity/Trunk Assessment Right Upper Extremity Assessment RUE ROM/Strength/Tone: Within functional levels RUE Coordination: WFL - gross/fine motor Left Upper Extremity Assessment LUE ROM/Strength/Tone: Within functional levels LUE Coordination: WFL - gross/fine motor Right Lower Extremity Assessment RLE ROM/Strength/Tone: Within functional levels Left Lower Extremity Assessment LLE ROM/Strength/Tone: Within functional levels Trunk Assessment Trunk Assessment: Normal   Mobility Transfers Transfers: Sit to Stand;Stand to Sit Sit to Stand: 6: Modified independent (Device/Increase time);From chair/3-in-1 Stand to Sit: 6: Modified independent (Device/Increase time);To chair/3-in-1 Details for Transfer Assistance: definite need to use BIL UE due to obesity and holding breath with sit<>stand. Pt without symptoms with oxygen 79% on 2 L   Exercise      Balance Static  Sitting Balance Static Sitting - Balance Support: Feet supported;No upper extremity supported Static Sitting - Level of Assistance: 6: Modified independent (Device/Increase time) Static Sitting - Comment/# of Minutes: 2  End of Session OT - End of Session Activity Tolerance: Other (comment) (requires frequent restbreaks maintain oxygen levels >90%) Patient left: in chair;with call bell/phone within reach Nurse Communication: Mobility status   Lucile Shutters 12/05/2011, 2:34 PM Pager: [phone]

## 2011-12-05 NOTE — Progress Notes (Addendum)
PGY-1 Daily Progress Note Family Medicine Teaching Service   Subjective: Feels much better today. Hoping to go home soon.   Objective:  Temp:  [98.2 F (36.8 C)-98.8 F (37.1 C)] 98.4 F (36.9 C) (06/24 0441) Pulse Rate:  [82-99] 93  (06/24 0551) Resp:  [15-22] 15  (06/24 0551) BP: (107-145)/(57-95) 142/95 mmHg (06/24 0441) SpO2:  [86 %-98 %] 94 % (06/24 0551) Weight:  [286 lb 9.6 oz (130 kg)] 286 lb 9.6 oz (130 kg) (06/24 0441)  Intake/Output Summary (Last 24 hours) at 12/05/11 0807 Last data filed at 12/05/11 0600  Gross per 24 hour  Intake    853 ml  Output    976 ml  Net   -123 ml   Physical Exam  Constitutional: NAD, sitting up eating breakfast, very interactive, cheerful.  Cardiovascular: Regular rate and rhythm and intact distal pulses. Exam reveals no gallop and no friction rub.  No murmur heard.  Lungs:  Occasional slight wheeze. Normal respiratory effort, chest expands symmetrically. Lungs are clear to auscultation, no crackles or rhonchi.  Abdomen: soft and non-tender without masses.  No guarding or rebound Extremities:   Non-tender, No cyanosis, edema, or deformity noted. Knee brace right leg.   Labs and imaging:   CBC  Lab 12/03/11 0450 12/02/11 0350 11/29/11 2332  WBC 6.9 6.1 7.1  HGB 11.0* 10.8* 11.2*  HCT 37.5 37.2 36.0  PLT 242 249 248   BMET/CMET  Lab 12/04/11 0340 12/03/11 0450 12/02/11 0350  NA 139 141 140  K 4.1 3.8 3.8  CL 94* 92* 93*  CO2 37* 43* 41*  BUN 18 15 10   CREATININE 0.67 0.75 0.59  CALCIUM 10.1 9.8 9.6  PROT -- -- --  BILITOT -- -- --  ALKPHOS -- -- --  ALT -- -- --  AST -- -- --  GLUCOSE 252* 163* 191*   No results found.  Assessment  46 year old female with mental retardation with asthma exacerbation and resolving RML pneumonia.   1. Hypercarbia/respiratortory acidosis secondary to probable obesity hypoventilation and known OSA with initial refusal to use CPAP (exacberated by decreased reserve from PNA/asthma  exacerbation)-improved on Bipap both by ABG and by patient's ability to awaken (oreviously very somnolent and difficult to awaken).  Patient is DNR/DNI-see pulm note. Patient has now been weaned and using CPAP overnight x 2 nights.  TSH not elevated.   -will order DME CPAP    2. Asthma, persistent with acute exacerbation-poor control at baseline with albuterol use twice a day. Flare could have been triggered by a virus/PNA  -minimal wheeze today. No increased WOB.   -q4/q2 albuterol. No atrovent per CCM      -transitioned to solumedrol per CCM initially. Will transition to prednisone today.   -on CPAP overnight.   -only controller at home is Singulair.    -will start pulmicort nebs as patient does not use MDI effectively per respiratory.   -respiratory to provide pulmonary toilet-IS specifically ordered   -will discuss transferring to floor today.   -much improved, likely discharge home today vs tomorrow   -will walk without oxygen to see if qualifies for home o2.    -PT/OT today due to possible deconditioning.    -will update HCPOA cousin Theodoro Grist today  3. Pneumonia-patient afebrile and does not have WBC. Developing PNA on CXR with hypoxia. Subjective fevers at home along with increasing sputum production. Think main contributor is asthma but will treat for community acquired pneumonia requiring hospitalization with CTX (day  6/7 today) and azithromycin (completed).   #. HTN-holding home meds (arb/hctz) . Moderate control. SBP occasionally above 140 but not typically. Will monitor. Will avoid beta blocker and ace-i per CCM.  #. DM-only on metformin at home. A1c 6.8.  Advanced to resistant SSI due to poor control yesterday. WIll monitor.  #. Hypertriglyceridemia-continue home fenofibrate. Stable.  #. Anemia-Stable, FOBT positive, will encourage outpatient follow up-may need colonoscopy.   FEN/GI-carb modified.  SLIV. May need IVF if PO decreases, monitor PPx-heparin SQ, PPI DIsposition- Lives  in independent living facility with aunt who has dementia. May need SNF when able to leave. Likely transfer to floor or discharge home.   Code status: DNR  Shelva Majestic, MD 8:07 AM 12/05/2011

## 2011-12-05 NOTE — Progress Notes (Signed)
Name: Shelley [name] MRN: [id number] DOB: 04-23-1966    LOS: 6  Referring Provider:  Teaching Service Reason for Referral:  Lethargy, OSA, , ? OHS, purulent sputum, cough.  PULMONARY / CRITICAL CARE MEDICINE  HPI: 46 yo [place] WF , OSA with cpap , hx of asthma, admitted 6/18 with cough, subjective fevers and found to be hypoxic on admit. PCCM called for evaluation 6/21 with ams/hypercarbia > responded to bipap.   Current Status: Feels normal  RN feels no criteria for step down  Used cpap last night per rN   Vital Signs: Temp:  [98.1 F (36.7 C)-99.7 F (37.6 C)] 98.3 F (36.8 C) (06/24 2056) Pulse Rate:  [65-130] 84  (06/24 2056) Resp:  [15-25] 19  (06/24 2056) BP: (109-146)/(57-95) 109/79 mmHg (06/24 2056) SpO2:  [75 %-98 %] 92 % (06/24 2110) Weight:  [125.3 kg (276 lb 3.8 oz)-130 kg (286 lb 9.6 oz)] 125.3 kg (276 lb 3.8 oz) (06/24 1900)      Physical Examination General:  Obese, child like affect, nad Neuro:  Alert and approp HEENT: no adenopathy Neck:  No jvd Cardiovascular:  hsr rrr Lungs:  Pan exp rhonchi bilaterally Abdomen:  obses + bs Musculoskeletal:  intact Skin:  warm  Principal Problem:  *Pneumonia Active Problems:  Developmental delay  Obstructive sleep apnea  Obesity hypoventilation syndrome  Acute and chronic respiratory failure  Acute respiratory failure  Hypoxemia   ASSESSMENT AND PLAN  PULMONARY  Lab 12/02/11 1500 12/02/11 1307  PHART 7.353 7.312*  PCO2ART 84.0* 91.5*  PO2ART 110.0* 94.1  HCO3 45.5* 44.9*  O2SAT 99.0 98.4   Ventilator Settings:   CXR:   No results found.    A:  Presumed Pna in setting of MO(291 lbs), asthma, osa and suspected OHS (elevated baseline hco3 virtually diagnostic in this setting) > hypercarbic RF / acute on chronic --Improved 6/22 > bipap prn. On 6/24 - improved status. USed nocturnal cpap - Rec use cpap qhs - Rx airways aggressively with IV Steroids, no atrovent, max gerd  rx  CARDIOVASCULAR  Lab 11/29/11 2332  TROPONINI --  LATICACIDVEN --  PROBNP 32.7      A: HTN/ no evidence chf P:  Per TS - avoid acei and non-specific beta blockers in this setting  RENAL  Lab 12/04/11 0340 12/03/11 0450 12/02/11 0350 12/01/11 0430 11/30/11 0410  NA 139 141 140 140 137  K 4.1 3.8 -- -- --  CL 94* 92* 93* 98 96  CO2 37* 43* 41* 36* 32  BUN 18 15 10 9 10   CREATININE 0.67 0.75 0.59 0.55 0.61  CALCIUM 10.1 9.8 9.6 8.9 8.4  MG -- 1.9 -- -- --  PHOS -- 2.6 -- -- --   Intake/Output      06/24 0701 - 06/25 0700   P.O. 480   I.V. (mL/kg)    IV Piggyback    Total Intake(mL/kg) 480 (3.8)   Urine (mL/kg/hr) 400 (0.2)   Stool 0   Total Output 400   Net +80       Urine Occurrence 1 x   Stool Occurrence 1 x    Foley:    A:  No acute issues     GASTROINTESTINAL No results found for this basename: AST:5,ALT:5,ALKPHOS:5,BILITOT:5,PROT:5,ALBUMIN:5 in the last 168 hours  A:  Obesity P:   -wt loss  HEMATOLOGIC  Lab 12/03/11 0450 12/02/11 0350 11/29/11 2332 11/29/11 1446 11/29/11 1424  HGB 11.0* 10.8* 11.2* 13.9 12.5  HCT 37.5 37.2 36.0 41.0  39.1  PLT 242 249 248 -- 267  INR -- -- -- -- --  APTT -- -- -- -- --   A:  Anemia P:  Per TS   INFECTIOUS  Lab 12/03/11 0450 12/02/11 0350 11/29/11 2332 11/29/11 1424  WBC 6.9 6.1 7.1 7.2  PROCALCITON -- -- -- --   Cultures: None  Antibiotics: 6/18 roc>> 6/19 zithro x 5 doses total  A: presumed CAP P:   See flows  ENDOCRINE  Lab 12/05/11 1628 12/05/11 1455 12/05/11 1245 12/05/11 0818 12/04/11 2155  GLUCAP 261* 347* 238* 219* 262*   A:  DM   P:   -per TS, note on steroids  NEUROLOGIC  A:  Obtunded P: resolved      BEST PRACTICE / DISPOSITION Level of Care:  sdu -> ok to go to floor, Primary Service:  Teaching service Consultants:  pccm will sign off 6/24. Please set up opd sleep followup  Code Status:  full Diet:  Low carb DVT Px:  heparin GI Px:  ppi Skin Integrity:   intact Social / Family:  None at bedside  Dr. Kalman Shan, M.D., [facility].C.P Pulmonary and Critical Care Medicine Staff Physician [facility] Pager: [phone], If no answer or between  15:00h - 7:00h: call [phone]  12/05/2011 10:56 PM

## 2011-12-05 NOTE — Progress Notes (Signed)
Patient not ready to go on CPAP at this time.  RT will continue to monitor. 

## 2011-12-05 NOTE — Evaluation (Signed)
Physical Therapy Evaluation Patient Details Name: Shelley [name] MRN: [id number] DOB: 11/18/1965 Today's Date: 12/05/2011 Time: 7829-5621 PT Time Calculation (min): 15 min  PT Assessment / Plan / Recommendation Clinical Impression  Pt admitted with asthma and PNA and able to ambulate well on 2L oxygen. Her oxygen level dropped to 76% on RA and was 83% on 2L with quick return to 93% in 30sec with seated rest on 2L. HR 103 at rest and up to 123 with activity. Pt very pleasant and able to don/doff socks without assist. Pt at baseline functional level and no further therapy needs. Recommend daily ambulation with nursing staff.     PT Assessment  Patent does not need any further PT services    Follow Up Recommendations  No PT follow up    Barriers to Discharge        lEquipment Recommendations  None recommended by PT    Recommendations for Other Services     Frequency      Precautions / Restrictions Precautions Precautions: None Precaution Comments: oxygen Restrictions Weight Bearing Restrictions: No   Pertinent Vitals/Pain No pain      Mobility  Transfers Transfers: Sit to Stand;Stand to Sit Sit to Stand: 6: Modified independent (Device/Increase time);From chair/3-in-1 Stand to Sit: 6: Modified independent (Device/Increase time);To chair/3-in-1 Details for Transfer Assistance: definite need to use BIL UE due to obesity and holding breath with sit<>stand. Pt without symptoms with oxygen 79% on 2 L Ambulation/Gait Ambulation/Gait Assistance: 6: Modified independent (Device/Increase time) Ambulation Distance (Feet): 400 Feet Assistive device: None Ambulation/Gait Assistance Details: Pt able to ambulate without assist and with pulling oxygen tank Gait Pattern: Within Functional Limits Gait velocity: WFL Stairs: No    Exercises     PT Diagnosis:    PT Problem List:   PT Treatment Interventions:     PT Goals    Visit Information  Last PT Received On:  12/05/11 Assistance Needed: +1 PT/OT Co-Evaluation/Treatment: Yes    Subjective Data  Subjective: I stay with my friend Patient Stated Goal: crochet potholders   Prior Functioning  Home Living Lives With: Other (Comment) (lives with friend) Available Help at Discharge: Other (Comment) (RN (A) 9:30-2pm - someone to stay over night) Type of Home: Apartment Home Access: Level entry Home Layout: One level Bathroom Shower/Tub: Walk-in shower;Door [employer] Toilet: Standard Home Adaptive Equipment: Shower chair with back;Bedside commode/3-in-1 Additional Comments: wears a Lt knee brace Prior Function Level of Independence: Independent;Needs assistance Needs Assistance: Meal Prep Meal Prep: Total (eats in dining hall and can be brought to the room) Able to Take Stairs?: No Driving: No Comments: has a poodle named Tree surgeon Communication: No difficulties (child like tone to voice) Dominant Hand: Right    Cognition  Overall Cognitive Status: History of cognitive impairments - at baseline Arousal/Alertness: Awake/alert Orientation Level: Appears intact for tasks assessed Behavior During Session: [facility] for tasks performed    Extremity/Trunk Assessment Right Upper Extremity Assessment RUE ROM/Strength/Tone: Within functional levels RUE Coordination: WFL - gross/fine motor Left Upper Extremity Assessment LUE ROM/Strength/Tone: Within functional levels LUE Coordination: WFL - gross/fine motor Right Lower Extremity Assessment RLE ROM/Strength/Tone: Within functional levels Left Lower Extremity Assessment LLE ROM/Strength/Tone: Within functional levels Trunk Assessment Trunk Assessment: Normal   Balance Static Sitting Balance Static Sitting - Balance Support: Feet supported;No upper extremity supported Static Sitting - Level of Assistance: 6: Modified independent (Device/Increase time) Static Sitting - Comment/# of Minutes: 2  End of Session PT - End of  Session Equipment Utilized During Treatment: Gait belt Activity Tolerance: Patient tolerated treatment well Patient left: in chair   Delorse Lek 12/05/2011, 2:32 PM  Delaney Meigs, PT [phone]

## 2011-12-05 NOTE — Progress Notes (Signed)
Pt arrived to floor. Alert and oriented. No signs of acute distress. VSS. Oriented pt to floor, staff and call bell. Call bell within reach. Pt resting comfortably in chair. Instructed pt to call before getting up. Will continue to monitor pt.

## 2011-12-05 NOTE — Progress Notes (Signed)
I interviewed and examined this patient and discussed the care plan with Dr. Durene Cal and the [facility] [place] [facility] team and agree with assessment and plan as documented in the progress note for today.    Kea Callan A. Sheffield Slider, MD Family Medicine Teaching Service Attending  12/05/2011 3:38 PM

## 2011-12-06 LAB — CBC
MCV: 89.8 fL (ref 78.0–100.0)
Platelets: 278 10*3/uL (ref 150–400)
RDW: 13.4 % (ref 11.5–15.5)
WBC: 6.5 10*3/uL (ref 4.0–10.5)

## 2011-12-06 LAB — BASIC METABOLIC PANEL
Chloride: 94 mEq/L — ABNORMAL LOW (ref 96–112)
Creatinine, Ser: 0.84 mg/dL (ref 0.50–1.10)
GFR calc Af Amer: >90 mL/min (ref >90)
Potassium: 3.9 mEq/L (ref 3.5–5.1)

## 2011-12-06 LAB — GLUCOSE, CAPILLARY

## 2011-12-06 MED ORDER — BUDESONIDE 0.25 MG/2ML IN SUSP: 0.2500 mg | Freq: Two times a day (BID) | RESPIRATORY_TRACT | Status: DC

## 2011-12-06 MED ORDER — PREDNISONE 10 MG PO TABS: ORAL_TABLET | ORAL | Status: AC

## 2011-12-06 MED ORDER — ALBUTEROL SULFATE (5 MG/ML) 0.5% IN NEBU: 2.5000 mg | INHALATION_SOLUTION | RESPIRATORY_TRACT | Status: DC | PRN

## 2011-12-06 NOTE — Significant Event (Cosign Needed)
She has a follow up appointment with Dr Craige Cotta for sleep evaluation July 3rd 3:30 pm. Brett Canales Marilyne Haseley ACNP Adolph Pollack PCCM Pager [phone] till 3 pm If no answer page [phone] 12/06/2011, 1:59 PM

## 2011-12-06 NOTE — Progress Notes (Signed)
Discussed discharge instructions with pt and pt's caretaker [name]. Neither showed any barriers to discharge. IV removed. Tele removed. Assessment unchanged from morning. Pt discharged to home.

## 2011-12-06 NOTE — Discharge Summary (Signed)
Physician Discharge Summary  Patient ID: Shelley [name] MRN: [id number] DOB: Nov 17, 1965 Age: 46 y.o.  Admit date: 11/29/2011 Discharge date: 12/06/2011  PCP: Maryelizabeth Rowan, MD  Consultants: Pulmonary/CCM   Discharge Diagnosis: Principal Problem:  *Pneumonia Active Problems:  ASTHMA, INTERMITTENT, MILD  Developmental delay  Obstructive sleep apnea  Obesity hypoventilation syndrome  Acute and chronic respiratory failure  Acute respiratory failure  Hypoxemia    Hospital Course 46 year old female with mental retardation with asthma exacerbation and resolving RML pneumonia.   1. Hypercarbia/respiratory acidosis secondary to probable obesity hypoventilation and known OSA with initial refusal to use CPAP (exacberated by decreased reserve from PNA/asthma exacerbation)-Patient found to be somnolent after several days of treatment for asthma/pneumonia. Pulmonary/CCM consulted and assisted with management as concern that patient may need a tracheostomy given posture/OSA/obesity hypoventilation. Initial ABG showed acute on chronic respiratory acidosis with co2 >90. Patient improved on Bipap both by ABG and by patient's ability to awaken. Decision by patient and HCPOA to make patient DNR/DNI and use Bipap only. Patient was been weaned off of BIPAP and became compliant with CPAP overnight x 3 nights before discharge.  TSH not elevated. DME CPAP ordered. Scheduled for follow up with pulmonary for sleep study. Bicarb did slowly increase after stopping Bipap but patient was not somnolent and had normal mentation.    2. Asthma, persistent with acute exacerbation-poor control at baseline with albuterol use twice a day. Flare could have been triggered by a virus/PNA as well as bronchospasm from pneumonia. Patient initially started on q2/q1 albuterol which was advanced to duonebs. Atrovent was stopped as it can exacerbate upper airway. Started on steroids and increased to IV solumedrol before patient  changed to prednisone taper. Home controller Singular. Patient did not use MDI's well so sent home with extra controller of pulmicort nebulizer. Patient also sent home with albuterol nebulizer. Patient improved steadily during the course of admission especially from a wheezing perspective. Patient did still have a 2-3L oxygen requirement at time of discharge. She is to continue with this until following up with PCP  3. Pneumonia- Developing PNA on CXR with associated hypoxia. Subjective fevers at home along with increasing sputum production. Treated with 7 days of ceftriaxone and 5 days of azithromycin.   4. Code Status-DNR/DNI. Bipap/CPAP only per [facility] [name]. Social situation-Lives in independent living facility with friend [name] who patient refers to as aunt. [name] had HCPOA but this was transferred to son [name] during this hospitalization.   Chronic Medical Problems  #. HTN-held home meds (arb/hctz) at admission . Moderate control. Restarted meds at discharge. Avoid beta blocker and ace-i per CCM in future due to difficult airway.  #. DM-only on metformin at home. A1c 6.8. Carb modified diet. Moderate control in house advanced to resistant sliding scale with improved control. Discharged on metformin #. Hypertriglyceridemia-continued home fenofibrate. Stable.  #. Anemia-Stable, FOBT positive, will encourage outpatient follow up-may need colonoscopy.   Discharge Physical Exam: Temp:  [97.9 F (36.6 C)-99.7 F (37.6 C)] 97.9 F (36.6 C) (06/25 0758) Pulse Rate:  [59-130] 59  (06/25 0758) Resp:  [16-20] 20  (06/25 0758) BP: (109-146)/(57-93) 112/77 mmHg (06/25 0758) SpO2:  [75 %-99 %] 99 % (06/25 0758) Weight:  [276 lb 3.8 oz (125.3 kg)] 276 lb 3.8 oz (125.3 kg) (06/24 1900) Constitutional: NAD, sitting up eating breakfast, very interactive, cheerful. Morbidly obese Cardiovascular: Regular rate and rhythm and intact distal pulses. Exam reveals no gallop and no friction rub. No  murmur  heard.  Lungs:  No wheeze noted today for first time during hospitalization. Normal respiratory effort, chest expands symmetrically. Lungs are clear to auscultation, no crackles or rhonchi.  Abdomen: soft and non-tender without masses. No guarding or rebound Extremities: Non-tender, No cyanosis, edema, or deformity noted. Knee brace right leg.    Procedures/Imaging:  Dg Chest 2 View  12/02/2011  *RADIOLOGY REPORT*  Clinical Data: Hypoxia, cough, shortness of breath  CHEST - 2 VIEW  Comparison: 11/29/2011  Findings: Cardiomediastinal silhouette is stable.  Central mild bronchitic changes are noted.  Again noted right infrahilar airspace disease suspicious for infiltrate.  Streaky left basilar atelectasis or infiltrate.  No pulmonary edema.  IMPRESSION: Central mild bronchitic changes are noted.  Again noted right infrahilar airspace disease suspicious for infiltrate.  Streaky left basilar atelectasis or infiltrate.  No pulmonary edema.  Original Report Authenticated By: Natasha Mead, M.D.   Dg Chest 2 View  11/29/2011  *RADIOLOGY REPORT*  Clinical Data: Shortness of breath.  Low oxygen saturations. Productive cough.  CHEST - 2 VIEW  Comparison: Chest x-ray 11/29/2011.  Findings: Projecting in the region of the right middle lobe there is an ill-defined interstitial and airspace opacity, concerning for developing bronchopneumonia.  Lungs otherwise appear clear.  No pleural effusions.  Pulmonary vasculature is normal.  Heart size is borderline enlarged, likely accentuated by low lung volumes. Mediastinal contours are unremarkable.  IMPRESSION: 1.  Findings, as above, concerning for developing right middle lobe bronchopneumonia.  Original Report Authenticated By: Florencia Reasons, M.D.   Dg Chest Port 1 View  11/29/2011  *RADIOLOGY REPORT*  Clinical Data: Hypoxia  PORTABLE CHEST - 1 VIEW  Comparison: 07/05/2006  Findings: Cardiomediastinal silhouette is stable.  No acute infiltrate or pleural effusion.   No pulmonary edema.  Bony structures are stable .  IMPRESSION: No active disease.  Original Report Authenticated By: Natasha Mead, M.D.    Labs  CBC  Lab 12/06/11 0630 12/03/11 0450 12/02/11 0350  WBC 6.5 6.9 6.1  HGB 11.2* 11.0* 10.8*  HCT 36.9 37.5 37.2  PLT 278 242 249   BMET  Lab 12/06/11 0630 12/04/11 0340 12/03/11 0450  NA 141 139 141  K 3.9 4.1 3.8  CL 94* 94* 92*  CO2 40* 37* 43*  BUN 26* 18 15  CREATININE 0.84 0.67 0.75  CALCIUM 9.7 10.1 9.8  PROT -- -- --  BILITOT -- -- --  ALKPHOS -- -- --  ALT -- -- --  AST -- -- --  GLUCOSE 129* 252* 163*   Patient condition at time of discharge/disposition: stable  Disposition-home in an independent living facility with [facility] RN aid.    Follow up issues: 1. Consider follow up BMET to follow bicarb. Please encourage patient to use CPAP as obesity hypoventilation and OSA causing increasing hypercarbia when patient not compliant with treatment.  2. Follow up respiratory status as hospitalized for asthma/pneumonia. Please make sure patient using pulmicort BID to better control asthma.  3. Anemia-Stable around 11, FOBT positive. Potentially may need colonoscopy.  4. Patient needs to lose weight to help with obesity hypoventilation and obstructive sleep apnea.   Discharge follow up:   Follow-up Information    Follow up with PARRETT,TAMMY, NP on 12/13/2011. (9:15 AM for lung doctor follow up)    Contact information:   [facility] [address] [place] [address] [phone]       Follow up with [facility], MD. Schedule an appointment as soon as possible for a  visit in 1 week.   Contact information:   [address] [place] [address] [phone]           Discharge Medications  Truly, Stankiewicz  Home Medication Instructions NGE:[id number]   Printed on:12/06/11 [phone]  Medication Information                    albuterol (PROVENTIL HFA;VENTOLIN HFA) 108  (90 BASE) MCG/ACT inhaler Inhale 2 puffs into the lungs every 6 (six) hours as needed. For shortness of breath.           docusate sodium (COLACE) 100 MG capsule Take 100 mg by mouth daily.           fenofibrate 160 MG tablet Take 160 mg by mouth daily.           loratadine (CLARITIN) 10 MG tablet Take 10 mg by mouth daily as needed. For allergies           losartan-hydrochlorothiazide (HYZAAR) 100-25 MG per tablet Take 1 tablet by mouth daily.           metFORMIN (GLUCOPHAGE) 1000 MG tablet Take 1,000 mg by mouth daily with breakfast.           montelukast (SINGULAIR) 10 MG tablet Take 10 mg by mouth at bedtime.           ergocalciferol (VITAMIN D2) 50000 UNITS capsule Take 50,000 Units by mouth once a week.           albuterol (PROVENTIL) (5 MG/ML) 0.5% nebulizer solution Take 0.5 mLs (2.5 mg total) by nebulization every 4 (four) hours as needed for wheezing or shortness of breath (1st 24 hrs, every 4 hours).           budesonide (PULMICORT) 0.25 MG/2ML nebulizer solution Take 2 mLs (0.25 mg total) by nebulization 2 (two) times daily.           predniSONE (DELTASONE) 10 MG tablet Take 4 pills for 3 days, then 3 pills for 3 days, then 2 pills for 3 days, then 1 pill for 3 days, then stop.                Tana Conch, MD of [facility] 12/06/2011 9:48 AM

## 2011-12-06 NOTE — Progress Notes (Signed)
Placed patient of CPAP QHS.  Patient tolerating well.

## 2011-12-06 NOTE — Progress Notes (Addendum)
Chart reviewed, noted desaturation with ambulation, however PT/OT not recommending follow up and no SNF needs.  CM will arrange home O2 if ordered and CPAP. Noted that pt will need sleep study scheduled, Will continue to follow. Will ask for [facility] services to include respiratory therapist to assist pt with ongoing teaching re use of CPAP. HHRN also able to reinforce teaching.  Shelley Shock RN MPH Case Manager [phone]  12/06/2011 1323 Spoke with pt who understands that she will be d/c with oxygen, pt provided phone number for caregiver Shelley [name], however this is voice mail only,  Will continue to try to reach Shelley [name], who pt reports was a nurse and assist with her care.  Shelley Shock RN MPH Case Manager  [phone]    CARE MANAGEMENT NOTE 12/06/2011  Patient:  Shelley [name], Shelley [name]   Account Number:  [id number]  Date Initiated:  11/30/2011  Documentation initiated by:  Shelley [name],Shelley [name]  Subjective/Objective Assessment:   46 yr-old female adm with CAP; lives with aunt in independent retirement community     Action/Plan:   Anticipated DC Date:  12/06/2011   Anticipated DC Plan:  HOME W HOME HEALTH SERVICES      DC Planning Services  CM consult      PAC Choice  DURABLE MEDICAL EQUIPMENT  HOME HEALTH   Choice offered to / List presented to:  C-1 Patient   DME arranged  CPAP  NEBULIZER MACHINE  OXYGEN      DME agency  [facility]     [facility] arranged  HH-1 RN  HH-7 RESPIRATORY THERAPY      HH agency  [facility]   Status of service:  Completed, signed off Medicare Important Message given?   (If response is "NO", the following Medicare IM given date fields will be blank) Date Medicare IM given:   Date Additional Medicare IM given:    Discharge Disposition:  HOME W HOME HEALTH SERVICES  Per UR Regulation:    If discussed at Long Length of Stay Meetings, dates discussed:    Comments:  PCP:  Dr. Maryelizabeth Rowan 12/06/2011 Spoke with pt re home oxygen and called  caregiver, Shelley [name] with pt permission to discuss O2 needs. She will pick pt up from the hospital and is aware of pt DME needs. Shelley Shock RN MPH Case manager [phone]  12/05/11 1100 [address] RN MSN CCM Received referral to assist with CPAP.  Per pt, she had CPAP a year ago and [employer] retrieved as she did not use correctly.  TC to attending resident and requested referral for respiratory therapy to work with pt while here -home health RN can visit and respiratory therapist with equipment company can review application with pt after d/c. PT/OT evals pending to determine if pt needs SNF for rehab.  6//21/13 1630 Shelley [name] Mayo RN MSN CCM Pt's cousin, Shelley [name], is present with attorney, durable POA has been completed and signed.  12/01/11 1040 Shelley [name] Mayo RN MSN CCM Per aunt's caregiver, Shelley [name] [phone]), aunt is legal guardian but has alzheimer's disease - she is caregiver during the day, states aunt and pt have been managing well when caregiver is not present.

## 2011-12-06 NOTE — Progress Notes (Signed)
Patient is not yet ready to go on CPAP.  RT will continue to monitor.

## 2011-12-06 NOTE — Discharge Instructions (Signed)
I want you to do the following:  For your asthma Take your pulmicort nebulizers twice a day EVERYDAY for now on.  Take albuterol nebulizers every 4 hours for 24 hours then as needed. We are using nebulizers because you had a hard time using the inhalers.  Take the prednisone as prescribed, remember you will take less and less each day. Follow instructions on bottle.  Continue to use your oxygen until you follow up with Dr. Duanne Guess. She can decide if you will continue to need the oxygen.  If your breathing were to get worse, make sure to see Dr. Duanne Guess immediately.   For your sleep apnea: IT is VERY important that you use your CPAP breathing machine everynight. It is the machine with the large mask. You must use this during sleeping hours. If you don't, you may become very very sleepy during the day and this can threaten your health. If this starts to happen, you should see Dr. Duanne Guess immediately.   For your pneumonia: You have already taken all the antibiotics you need to take.  If you were to develop a fever, be sure to contact your doctor.   You will be having a home health nurse come to your home to help you. Also when the people bring the equipment for your nebulizer and CPAP, be sure to ask them how to use your equipment.

## 2011-12-06 NOTE — Progress Notes (Signed)
Pt has a critical CO2 of 40. MD notified. No orders received.

## 2011-12-06 NOTE — Progress Notes (Signed)
Pt O2 sat on room air at rest is 76%

## 2011-12-13 ENCOUNTER — Inpatient Hospital Stay: Payer: Medicare Other | Admitting: Adult Health

## 2011-12-22 ENCOUNTER — Ambulatory Visit (INDEPENDENT_AMBULATORY_CARE_PROVIDER_SITE_OTHER)
Admission: RE | Admit: 2011-12-22 | Discharge: 2011-12-22 | Disposition: A | Discharge status: Home / Self Care | Payer: Medicare Other | Source: Ambulatory Visit | Attending: Adult Health | Admitting: Adult Health

## 2011-12-22 ENCOUNTER — Other Ambulatory Visit (INDEPENDENT_AMBULATORY_CARE_PROVIDER_SITE_OTHER): Payer: Medicare Other

## 2011-12-22 ENCOUNTER — Encounter: Payer: Self-pay | Admitting: Adult Health

## 2011-12-22 ENCOUNTER — Ambulatory Visit (INDEPENDENT_AMBULATORY_CARE_PROVIDER_SITE_OTHER): Payer: Medicare Other | Admitting: Adult Health

## 2011-12-22 VITALS — BP 130/66 | HR 109 | Temp 97.7°F | Ht 66.0 in | Wt 280.4 lb

## 2011-12-22 DIAGNOSIS — J189 Pneumonia, unspecified organism: Secondary | ICD-10-CM

## 2011-12-22 DIAGNOSIS — G4733 Obstructive sleep apnea (adult) (pediatric): Secondary | ICD-10-CM

## 2011-12-22 DIAGNOSIS — J45909 Unspecified asthma, uncomplicated: Secondary | ICD-10-CM

## 2011-12-22 DIAGNOSIS — J9620 Acute and chronic respiratory failure, unspecified whether with hypoxia or hypercapnia: Secondary | ICD-10-CM

## 2011-12-22 LAB — BASIC METABOLIC PANEL
GFR: 79.62 mL/min (ref >60.00)
Glucose, Bld: 180 mg/dL — ABNORMAL HIGH (ref 70–99)
Potassium: 3.4 mEq/L — ABNORMAL LOW (ref 3.5–5.1)
Sodium: 140 mEq/L (ref 135–145)

## 2011-12-22 MED ORDER — BUDESONIDE 0.25 MG/2ML IN SUSP: 0.2500 mg | Freq: Two times a day (BID) | RESPIRATORY_TRACT | Status: DC

## 2011-12-22 MED ORDER — ALBUTEROL SULFATE (2.5 MG/3ML) 0.083% IN NEBU: 2.5000 mg | INHALATION_SOLUTION | RESPIRATORY_TRACT | Status: DC | PRN

## 2011-12-22 NOTE — Addendum Note (Signed)
Addended by: Boone Master E on: 12/22/2011 05:40 PM   Modules accepted: Orders

## 2011-12-22 NOTE — Assessment & Plan Note (Signed)
Resolved on xray today  Finished abx course

## 2011-12-22 NOTE — Assessment & Plan Note (Signed)
Needs to start pulmicort neb Twice daily   Use albuterol As needed

## 2011-12-22 NOTE — Assessment & Plan Note (Signed)
OSA /OHS now on nocturnal CPAP  Check bmet today  Weight loss encouraged  Cont on CPAP At bedtime

## 2011-12-22 NOTE — Assessment & Plan Note (Signed)
Cont on O2. And nocturnal CPAP

## 2011-12-22 NOTE — Progress Notes (Signed)
  Subjective:    Patient ID: Shelley [name], female    DOB: 1965-11-07, 46 y.o.   MRN: [id number]  HPI 46 yo female pt with known mental retardation of Dr. Maple Hudson with known hx of Asthma. (last seen in office 2009)  Seen in hospital 6/21/3 for pulmonary consult for resp failure   12/22/2011 Post Hospital  Admitted 6/18-6/25/13 for acute hypercarbic resp failure w/ resp acidosis secondary to OHS/OSA and asthma flare complicated by PNA . Initial PCO2 >90.  Tx with BIPAP , nebs and steroids along with abx.  She was discharged on nocturnal CPAP, and O2. At 3 l/m continuous.  Discharged on abx.  Made DNR prior to discharge.  Lives at [facility] living  Caregiver is with her today. Has a POA that takes care of her and her friend.  Since discharge is feeling much better w/ decreased cough and congestion  No edema or hemotpysis .  Wearing CPAP and O2 as directed.  Has finished ABX and steroids       Review of Systems Constitutional:   No  weight loss, night sweats,  Fevers, chills,  +fatigue, or  lassitude.  HEENT:   No headaches,  Difficulty swallowing,  Tooth/dental problems, or  Sore throat,                No sneezing, itching, ear ache, nasal congestion, post nasal drip,   CV:  No chest pain,  Orthopnea, PND, swelling in lower extremities, anasarca, dizziness, palpitations, syncope.   GI  No heartburn, indigestion, abdominal pain, nausea, vomiting, diarrhea, change in bowel habits, loss of appetite, bloody stools.   Resp:   No excess mucus, no productive cough,  No non-productive cough,  No coughing up of blood.  No change in color of mucus.  No wheezing.  No chest wall deformity  Skin: no rash or lesions.  GU: no dysuria, change in color of urine, no urgency or frequency.  No flank pain, no hematuria   MS:  No joint pain or swelling.  No decreased range of motion.  No back pain.  Psych:  No change in mood or affect. No depression or anxiety.  No memory  loss.    '    Objective:   Physical Exam GEN: A/Ox3; pleasant , NAD, obese   HEENT:  [place]/AT,  EACs-clear, TMs-wnl, NOSE-clear, THROAT-clear, no lesions, no postnasal drip or exudate noted.   NECK:  Supple w/ fair ROM; no JVD; normal carotid impulses w/o bruits; no thyromegaly or nodules palpated; no lymphadenopathy.  RESP  Coarse BS  w/o, wheezes/ rales/ or rhonchi.no accessory muscle use, no dullness to percussion  CARD:  RRR, no m/r/g  , no peripheral edema, pulses intact, no cyanosis or clubbing.  GI:   Soft & nt; nml bowel sounds; no organomegaly or masses detected.  Musco: Warm bil, no deformities or joint swelling noted.   Neuro: alert, no focal deficits noted.    Skin: Warm, no lesions or rashes         Assessment & Plan:

## 2011-12-22 NOTE — Patient Instructions (Addendum)
Begin Pulmicort Neb Twice daily   May Use Albuterol neb every 4 hrs As needed  For wheezing /shortness of breath.  Wear CPAP At bedtime   Continue on oxygen  I will call with labs.  Follow up Dr. Maple Hudson  In 4 weeks and As needed   Work on weight loss.  Please contact office for sooner follow up if symptoms do not improve or worsen or seek emergency care

## 2011-12-26 ENCOUNTER — Telehealth: Payer: Self-pay | Admitting: Adult Health

## 2011-12-26 NOTE — Telephone Encounter (Signed)
Called spoke with [name], advised of 12-22-11 lab results / recs as stated by TP:  Result Notes     Notes Recorded by Sherre Lain, MA on 12/23/2011 at 4:56 PM LMOM TCB x1. Pt is to keep 01-10-12 ov with VS rather than follow up with CY as stated on her instruction sheet from ov. ------  Notes Recorded by Julio Sicks, NP on 12/22/2011 at 1:41 PM Bicarb slightly up Make sure she wears CPAP at night -has only had for 1-2 weeks  Will get a download on return with Dr. Maple Hudson  Fax labs to PCP    [name] verbalized her understanding and will keep 01-10-12 ov with Dr Craige Cotta.  Nothing further needed, will sign off.

## 2011-12-29 ENCOUNTER — Telehealth: Payer: Self-pay | Admitting: Adult Health

## 2011-12-29 NOTE — Telephone Encounter (Signed)
Called an verified rx at [facility] at [place]. Pt aware nothing further needed.

## 2012-01-10 ENCOUNTER — Ambulatory Visit (INDEPENDENT_AMBULATORY_CARE_PROVIDER_SITE_OTHER): Payer: Medicare Other | Admitting: Pulmonary Disease

## 2012-01-10 ENCOUNTER — Encounter: Payer: Self-pay | Admitting: Pulmonary Disease

## 2012-01-10 VITALS — BP 106/62 | HR 92 | Temp 98.5°F | Ht 66.0 in | Wt 283.0 lb

## 2012-01-10 DIAGNOSIS — J9610 Chronic respiratory failure, unspecified whether with hypoxia or hypercapnia: Secondary | ICD-10-CM

## 2012-01-10 DIAGNOSIS — G4733 Obstructive sleep apnea (adult) (pediatric): Secondary | ICD-10-CM

## 2012-01-10 DIAGNOSIS — J45909 Unspecified asthma, uncomplicated: Secondary | ICD-10-CM

## 2012-01-10 DIAGNOSIS — J189 Pneumonia, unspecified organism: Secondary | ICD-10-CM

## 2012-01-10 NOTE — Assessment & Plan Note (Signed)
Will ensure that she gets started on pulmicort bid.  She is to continue singulair at night.  Advised she can use albuterol as needed.

## 2012-01-10 NOTE — Assessment & Plan Note (Signed)
From June 2013.  Improved.  She will need f/u CXR at next ROV.

## 2012-01-10 NOTE — Progress Notes (Signed)
Chief Complaint  Patient presents with  . Follow-up    breathing has been okay, wheezing, cough w/ clear phlem  . cpap    wears cpap machine everynight 6-7 hrs a night. denies any problems w/ mask/machine    History of Present Illness: Shelley [name] is a 46 y.o. female with OSA, OHS, and asthma.  She was previously seen in 2009 by Dr. Maple Hudson for cough, asthma, and rhinitis.  She was hospitalized in June 2013 for pneumonia, and acute on chronic hypoxic and hypercapnic respiratory failure.    She has been doing better since she was in the hospital.  She uses her CPAP every night.  She is using 3 liters oxygen 24/7.    She uses her albuterol twice per day.  She is using singulair every night.  She never got her script for pulmicort filled.  There was a problem with her pharmacy filling the script.   Past Medical History  Diagnosis Date  . Asthma   . Diabetes mellitus   . Hypertension   . Hyperlipidemia   . Pneumonia   . Anxiety   . GERD (gastroesophageal reflux disease)   . Arthritis   . Mental retardation     Past Surgical History  Procedure Date  . Cholecystectomy     2006    Outpatient Encounter Prescriptions as of 01/10/2012  Medication Sig Dispense Refill  . albuterol (PROVENTIL HFA;VENTOLIN HFA) 108 (90 BASE) MCG/ACT inhaler Inhale 2 puffs into the lungs every 6 (six) hours as needed. For shortness of breath.      Marland Kitchen albuterol (PROVENTIL) (2.5 MG/3ML) 0.083% nebulizer solution Take 3 mLs (2.5 mg total) by nebulization every 4 (four) hours as needed for wheezing or shortness of breath. DX: 493.90  360 mL  5  . docusate sodium (COLACE) 100 MG capsule Take 100 mg by mouth daily.      . ergocalciferol (VITAMIN D2) 50000 UNITS capsule Take 50,000 Units by mouth once a week.      . fenofibrate 160 MG tablet Take 160 mg by mouth daily.      Marland Kitchen loratadine (CLARITIN) 10 MG tablet Take 10 mg by mouth daily as needed. For allergies      . losartan-hydrochlorothiazide (HYZAAR)  100-25 MG per tablet Take 1 tablet by mouth daily.      . metFORMIN (GLUCOPHAGE) 1000 MG tablet Take 1,000 mg by mouth daily with breakfast.      . montelukast (SINGULAIR) 10 MG tablet Take 10 mg by mouth at bedtime.      . budesonide (PULMICORT) 0.25 MG/2ML nebulizer solution Take 2 mLs (0.25 mg total) by nebulization 2 (two) times daily. DX: 493.90  60 mL  5    Allergies  Allergen Reactions  . Fish Allergy Swelling  . Pepto-Bismol (Bismuth Subsalicylate) Nausea And Vomiting  . Cortisone Itching and Rash    Injections and cream only.  PO ok.    Physical Exam:  Blood pressure 106/62, pulse 92, temperature 98.5 F (36.9 C), temperature source Oral, height 5\' 6"  (1.676 m), weight 283 lb (128.368 kg), last menstrual period 11/30/2011, SpO2 98.00%.  Body mass index is 45.68 kg/(m^2). Wt Readings from Last 2 Encounters:  01/10/12 283 lb (128.368 kg)  12/22/11 280 lb 6.4 oz (127.189 kg)    General - No distress, wearing oxygen ENT - TM clear, no sinus tenderness, no oral exudate, no LAN, no thyromegaly Cardiac - s1s2 regular, no murmur, pulses symmetric, no edema Chest - normal respiratory excursion,  good air entry, no wheeze/rales/dullness Back - no focal tenderness Abd - soft, non-tender, no organomegaly, + bowel sounds Ext - normal motor strength Neuro - Cranial nerves are normal. PERLA. EOM's intact. Skin - no discernible active dermatitis, erythema, urticaria or inflammatory process. Psych - normal mood, and behavior.    Assessment/Plan:  Shelley Helling, MD [facility]/Sleep Pager:  [phone] 01/10/2012, 3:50 PM

## 2012-01-10 NOTE — Patient Instructions (Signed)
Pulmicort one vial nebulized twice per day Singulair 10 mg pill>>take one pill nightly before bedtime Albuterol one vial nebulized up to four times per day as needed for cough, wheeze, or chest congestion Will get report from CPAP machine Will arrange for oxygen test at night Follow up in 4 months with Chest xray

## 2012-01-10 NOTE — Assessment & Plan Note (Signed)
Will get copy of her CPAP download and then determine if she needs to have adjustment to her set up.

## 2012-01-10 NOTE — Assessment & Plan Note (Signed)
Related to OSA and OHS.  She is to continue with supplemental oxygen and CPAP.  Will get ONO, and determine if she needs adjustments.

## 2012-01-16 ENCOUNTER — Telehealth: Payer: Self-pay | Admitting: Pulmonary Disease

## 2012-01-16 ENCOUNTER — Encounter: Payer: Self-pay | Admitting: Pulmonary Disease

## 2012-01-16 NOTE — Telephone Encounter (Signed)
ONO with CPAP and 3 liters 01/13/12>>Test time 7 hrs 6 min.  Mean SpO2 89%, low SpO2 62%.  Spent 2 hrs 34 min with SpO2 < 88%.  Left message.  Will need to get CPAP download, and then determine what adjustments are needed for set up.  Will route to [facility] to ensure that CPAP download will be sent (order placed 01/10/12).

## 2012-01-16 NOTE — Telephone Encounter (Signed)
CPAP Download Report placed in Dr. Evlyn Courier look at. Please advise. Thanks,Rhonda J Cobb

## 2012-01-16 NOTE — Telephone Encounter (Signed)
Obtained cpap download and report given to Mindy for Dr. Craige Cotta. Rhonda J Cobb

## 2012-01-16 NOTE — Telephone Encounter (Signed)
Called and spoke with Shelley [name] with [facility]. He is checking on obtaining cpap download as requested. Rhonda J Cobb

## 2012-01-20 ENCOUNTER — Encounter: Payer: Self-pay | Admitting: Pulmonary Disease

## 2012-01-20 ENCOUNTER — Telehealth: Payer: Self-pay | Admitting: Pulmonary Disease

## 2012-01-20 DIAGNOSIS — G4733 Obstructive sleep apnea (adult) (pediatric): Secondary | ICD-10-CM

## 2012-01-20 NOTE — Telephone Encounter (Signed)
CPAP 12/16/11 to 01/14/12>>Used on 6 of 30 nights with average 7 hrs 3 min.  Average AHI 1.8 with CPAP 10 cm H2O.  Left message on pt's voicemail.  Asked her to call back with number that she can be reached at to discuss results of ONO and CPAP download.  Will forward message to my nurse to f/u with pt.

## 2012-01-23 NOTE — Telephone Encounter (Signed)
I spoke with pt and she stated the best # to reach her is [phone]. Please advise Dr. Craige Cotta thanks

## 2012-01-24 NOTE — Telephone Encounter (Signed)
Attempted to call pt.  Left message on voicemail asking her to call back.  She needs to increase oxygen to 4 liters at night and repeat ONO with 4 liters and CPAP.    If this is unsuccessful, then she would need to have repeat titration study, likely using BPAP instead of CPAP.  Will forward to my nurse to be aware of phone thread.

## 2012-01-26 NOTE — Telephone Encounter (Signed)
I spoke with the pt and advised her of the following: She needs to increase oxygen to 4 liters at night and repeat ONO with 4 liters and CPAP.  If this is unsuccessful, then she would need to have repeat titration study, likely using BPAP instead of CPAP. Pt states understanding. I will forward message to Dr. Craige Cotta to let him know the pt is aware and to see if he wants to place the order of have triage do so. Please advise. Carron Curie, CMA

## 2012-01-26 NOTE — Telephone Encounter (Signed)
Noted.  Okay for triage nurse to place order.

## 2012-01-26 NOTE — Telephone Encounter (Signed)
Order has been placed. Please advise PCC's thanks 

## 2012-02-02 ENCOUNTER — Encounter: Payer: Self-pay | Admitting: Pulmonary Disease

## 2012-02-02 ENCOUNTER — Telehealth: Payer: Self-pay | Admitting: Pulmonary Disease

## 2012-02-02 DIAGNOSIS — J9610 Chronic respiratory failure, unspecified whether with hypoxia or hypercapnia: Secondary | ICD-10-CM

## 2012-02-02 DIAGNOSIS — E662 Morbid (severe) obesity with alveolar hypoventilation: Secondary | ICD-10-CM

## 2012-02-02 DIAGNOSIS — G4733 Obstructive sleep apnea (adult) (pediatric): Secondary | ICD-10-CM

## 2012-02-02 NOTE — Telephone Encounter (Signed)
ONO with CPAP and 4 liters 01/31/12>>Test time 7 hrs 36 min.  Mean SpO2 91.6%, low SpO2 73%.  Spent 56 min with SpO2 < 88%.  Attempted to call pt to discuss results.  Will have my nurse call to inform pt that oxygen numbers are still low in spite of increase in oxygen.  She will need to change from CPAP to BPAP, and then have oxygen set up adjusted.  Will need to schedule BPAP titration study to determine optimal settings.  I have placed order for this.

## 2012-02-03 NOTE — Telephone Encounter (Signed)
lmomtcb x1 for pt 

## 2012-02-06 NOTE — Telephone Encounter (Signed)
,  lmomtcb x2

## 2012-02-07 ENCOUNTER — Encounter: Payer: Self-pay | Admitting: *Deleted

## 2012-02-07 NOTE — Telephone Encounter (Signed)
lmomtcb x3 for pt will send letter out to her home address as well

## 2012-02-09 ENCOUNTER — Encounter: Payer: Self-pay | Admitting: Pulmonary Disease

## 2012-02-10 ENCOUNTER — Telehealth: Payer: Self-pay | Admitting: Family Medicine

## 2012-02-10 NOTE — Telephone Encounter (Signed)
[facility] is calling about the necessary form for her Oxygen.  She said that per [facility] guidelines, the patient discharged with oxygen that was ordered was from Dr. Deirdre Priest so they just need him to complete the form for the initial order and then he won't be responsible after that.  They need this for [facility] to pay for her oxygen.  She will refax the form.

## 2012-02-20 ENCOUNTER — Ambulatory Visit (HOSPITAL_BASED_OUTPATIENT_CLINIC_OR_DEPARTMENT_OTHER): Payer: Medicare Other | Attending: Pulmonary Disease | Admitting: Radiology

## 2012-02-20 VITALS — Ht 66.0 in | Wt 290.0 lb

## 2012-02-20 DIAGNOSIS — G4733 Obstructive sleep apnea (adult) (pediatric): Secondary | ICD-10-CM | POA: Insufficient documentation

## 2012-02-20 DIAGNOSIS — E662 Morbid (severe) obesity with alveolar hypoventilation: Secondary | ICD-10-CM

## 2012-02-20 DIAGNOSIS — J9610 Chronic respiratory failure, unspecified whether with hypoxia or hypercapnia: Secondary | ICD-10-CM

## 2012-02-21 ENCOUNTER — Encounter: Payer: Self-pay | Admitting: *Deleted

## 2012-02-21 NOTE — Telephone Encounter (Signed)
I have tried calling pt again after sending her a letter. Still NA. Will send pt another letter out to her home address. Will also forward to Dr. Craige Cotta to make him aware I have not been able to reach pt.

## 2012-02-21 NOTE — Telephone Encounter (Signed)
Noted  

## 2012-02-24 ENCOUNTER — Telehealth: Payer: Self-pay | Admitting: Pulmonary Disease

## 2012-02-24 DIAGNOSIS — G4733 Obstructive sleep apnea (adult) (pediatric): Secondary | ICD-10-CM

## 2012-02-24 DIAGNOSIS — E662 Morbid (severe) obesity with alveolar hypoventilation: Secondary | ICD-10-CM

## 2012-02-24 NOTE — Telephone Encounter (Signed)
I spoke with pt and she is requesting her sleep study results from 02/20/12. i advised her it can take up to 2 weeks for this to be read. She voiced her understanding. Please advise Dr. Craige Cotta thanks

## 2012-02-29 DIAGNOSIS — G4733 Obstructive sleep apnea (adult) (pediatric): Secondary | ICD-10-CM

## 2012-02-29 NOTE — Procedures (Signed)
Shelley [name], Shelley [name]             ACCOUNT NO.:  [id number]  MEDICAL RECORD NO.:  [id number]          PATIENT TYPE:  OUT  LOCATION:  SLEEP CENTER                 FACILITY:  [facility] [address]  PHYSICIAN:  [name], MD        DATE OF BIRTH:  16-Feb-1966  DATE OF STUDY:  02/20/2012                           NOCTURNAL POLYSOMNOGRAM  REFERRING PHYSICIAN:  [name], MD  INDICATION FOR STUDY:  [name] is a 46 year old female who has a history of hypertension and diabetes.  She also has a history of obstructive sleep apnea and sleep-related hyperventilation.  She was having difficulties with CPAP therapy with continued sleep disruption and daytime sleepiness.  She therefore is referred to the sleep lab for a BiPAP titration study.  Height is 5 feet 6 inches, weight is 293 pounds, BMI is 47, neck size is 17.5 inches.  EPWORTH SLEEPINESS SCORE:  10.  MEDICATIONS:  Albuterol, Proventil, fenofibrate, Claritin, Hyzaar, metformin, and Singulair.  SLEEP ARCHITECTURE:  Total recording time was 390 minutes, total sleep time was 337 minutes.  Sleep efficiency was 83%.  Sleep latency was 4 minutes.  REM latency was 187 minutes.  The study was notable for lack of slow-wave sleep and she slept predominantly in the nonsupine position.  RESPIRATORY DATA:  The average respiratory rate was 18.  The patient was started on BiPAP of 12/8 and increased to 18/14.  It appeared that she had optimal control of her obstructive sleep apnea with an EPAP of 11. She appeared to have improved control of her oxygenation with IPAP of 17.  OXYGEN DATA:  The baseline oxygenation was 95%.  The oxygen saturation nadir was 71%.  The patient required use of 4 L of supplemental oxygen. In spite of this and using BiPAP, she still had episodes of oxygen desaturation.  CARDIAC DATA:  The average heart rate is 93 and the rhythm strip showed sinus rhythm with sinus tachycardia.  MOVEMENT-PARASOMNIA:  The patient had 1 restroom  trip and the periodic limb movement index is 2.6.  IMPRESSIONS-RECOMMENDATIONS:  The patient appeared to have reasonable control of her obstructive sleep apnea with BiPAP settings of 17/11. She did require the use of 4 L of supplemental oxygen.  With this combination, she had improvement in her oxygenation, but does still have borderline oxygen desaturations.  She was fitted with a ResMed Mirage Quattro small size full face mask.  In addition to diet, exercise, and weight reduction, I would recommend that the patient be started on BiPAP at 17/11 cm water with 4 L of supplemental oxygen.  She should then undergo an overnight oximetry on this set up at home to determine if she needs any further adjustments in her setup.     [name], MD Diplomat, American Board of Sleep Medicine    VS/MEDQ  D:  02/29/2012 08:17:28  T:  02/29/2012 23:52:07  Job:  [id number]

## 2012-03-01 NOTE — Telephone Encounter (Signed)
BPAP 02/20/12>>17/11 cm H2O with 4 liters oxygen.  Left message detailing tests results.  I have placed order for BPAP and oxygen.  Will arrange for ONO on this set up.  Will have my nurse call to confirm plan with pt, and to set up ROV 6 to 8 weeks after change to BPAP.

## 2012-03-02 NOTE — Telephone Encounter (Signed)
Pt did receive pt. She stated once she is set up she will call us to make the appt. Will also put reminder in my box as well for this.

## 2012-03-07 ENCOUNTER — Ambulatory Visit (INDEPENDENT_AMBULATORY_CARE_PROVIDER_SITE_OTHER)
Admission: RE | Admit: 2012-03-07 | Discharge: 2012-03-07 | Disposition: A | Discharge status: Home / Self Care | Payer: Medicare Other | Source: Ambulatory Visit | Attending: Pulmonary Disease | Admitting: Pulmonary Disease

## 2012-03-07 ENCOUNTER — Encounter: Payer: Self-pay | Admitting: Pulmonary Disease

## 2012-03-07 ENCOUNTER — Institutional Professional Consult (permissible substitution): Payer: Self-pay | Admitting: Pulmonary Disease

## 2012-03-07 ENCOUNTER — Ambulatory Visit (INDEPENDENT_AMBULATORY_CARE_PROVIDER_SITE_OTHER): Payer: Medicare Other | Admitting: Pulmonary Disease

## 2012-03-07 VITALS — BP 118/76 | HR 90 | Ht 66.0 in | Wt 284.4 lb

## 2012-03-07 DIAGNOSIS — J189 Pneumonia, unspecified organism: Secondary | ICD-10-CM

## 2012-03-07 DIAGNOSIS — J45909 Unspecified asthma, uncomplicated: Secondary | ICD-10-CM

## 2012-03-07 DIAGNOSIS — J4530 Mild persistent asthma, uncomplicated: Secondary | ICD-10-CM

## 2012-03-07 DIAGNOSIS — J9610 Chronic respiratory failure, unspecified whether with hypoxia or hypercapnia: Secondary | ICD-10-CM

## 2012-03-07 DIAGNOSIS — Z23 Encounter for immunization: Secondary | ICD-10-CM

## 2012-03-07 DIAGNOSIS — G4733 Obstructive sleep apnea (adult) (pediatric): Secondary | ICD-10-CM

## 2012-03-07 NOTE — Assessment & Plan Note (Signed)
She is to continue 4 liters oxygen.

## 2012-03-07 NOTE — Assessment & Plan Note (Signed)
Stable on her current regimen.  She got her flu shot today. 

## 2012-03-07 NOTE — Progress Notes (Signed)
Chief Complaint  Patient presents with  . Follow-up    wears bpap everynight w/ 4 liters oxygen. breathing has been fine. c/o wheezing. After breathing tx she gets up yellow phlem. would like flu shot    History of Present Illness: Shelley [name] is a 46 y.o. female with OSA, OHS, and asthma.  She has been doing okay.  She has occasional wheeze.  She is not coughing much.  She will occasional bring up clear to yellow sputum.  She denies fever, or sinus congestion.  She had her BPAP titration study on 02/20/12. She has not received her BPAP yet, but was told she will get this later in the week.  Tests: PSG 09/13/10>>AHI 98.6, SpO2 60% ONO with CPAP and 3 liters 01/13/12>>Test time 7 hrs 6 min. Mean SpO2 89%, low SpO2 62%. Spent 2 hrs 34 min with SpO2 < 88% CPAP 12/16/11 to 01/14/12>>Used on 6 of 30 nights with average 7 hrs 3 min. Average AHI 1.8 with CPAP 10 cm H2O. BPAP 02/20/12>>17/11 cm H2O with 4 liters oxygen.  Past Medical History  Diagnosis Date  . Asthma   . Diabetes mellitus   . Hypertension   . Hyperlipidemia   . Pneumonia   . Anxiety   . GERD (gastroesophageal reflux disease)   . Arthritis   . Mental retardation     Past Surgical History  Procedure Date  . Cholecystectomy     2006    Outpatient Encounter Prescriptions as of 03/07/2012  Medication Sig Dispense Refill  . albuterol (PROVENTIL HFA;VENTOLIN HFA) 108 (90 BASE) MCG/ACT inhaler Inhale 2 puffs into the lungs every 6 (six) hours as needed. For shortness of breath.      Marland Kitchen albuterol (PROVENTIL) (2.5 MG/3ML) 0.083% nebulizer solution Take 3 mLs (2.5 mg total) by nebulization every 4 (four) hours as needed for wheezing or shortness of breath. DX: 493.90  360 mL  5  . budesonide (PULMICORT) 0.25 MG/2ML nebulizer solution Take 2 mLs (0.25 mg total) by nebulization 2 (two) times daily. DX: 493.90  60 mL  5  . docusate sodium (COLACE) 100 MG capsule Take 100 mg by mouth daily.      . ergocalciferol (VITAMIN D2)  50000 UNITS capsule Take 50,000 Units by mouth once a week.      . fenofibrate 160 MG tablet Take 160 mg by mouth daily.      Marland Kitchen loratadine (CLARITIN) 10 MG tablet Take 10 mg by mouth daily as needed. For allergies      . losartan-hydrochlorothiazide (HYZAAR) 100-25 MG per tablet Take 1 tablet by mouth daily.      . metFORMIN (GLUCOPHAGE) 1000 MG tablet Take 1,000 mg by mouth daily with breakfast.      . montelukast (SINGULAIR) 10 MG tablet Take 10 mg by mouth at bedtime.        Allergies  Allergen Reactions  . Fish Allergy Swelling  . Pepto-Bismol (Bismuth Subsalicylate) Nausea And Vomiting  . Cortisone Itching and Rash    Injections and cream only.  PO ok.    Physical Exam:  Blood pressure 118/76, pulse 90, height 5\' 6"  (1.676 m), weight 284 lb 6.4 oz (129.003 kg), SpO2 95.00%.  Body mass index is 45.90 kg/(m^2). Wt Readings from Last 2 Encounters:  03/07/12 284 lb 6.4 oz (129.003 kg)  02/20/12 290 lb (131.543 kg)    General - No distress, wearing oxygen ENT - TM clear, no sinus tenderness, no oral exudate, no LAN, no thyromegaly Cardiac -  s1s2 regular, no murmur, pulses symmetric, no edema Chest - normal respiratory excursion, good air entry, no wheeze/rales/dullness Back - no focal tenderness Abd - soft, non-tender, no organomegaly, + bowel sounds Ext - normal motor strength Neuro - Cranial nerves are normal. PERLA. EOM's intact. Skin - no discernible active dermatitis, erythema, urticaria or inflammatory process. Psych - normal mood, and behavior.    Assessment/Plan:  [name], MD [facility]/Sleep Pager:  [phone] 03/07/2012, 3:57 PM

## 2012-03-07 NOTE — Assessment & Plan Note (Signed)
Will repeat chest xray today to document clearing of previous pulmonary infiltrates.

## 2012-03-07 NOTE — Patient Instructions (Signed)
Flu shot today Chest xray today>>will call with results Follow up in 4 months

## 2012-03-07 NOTE — Assessment & Plan Note (Signed)
She is to be transitioned to BPAP 17/11 with 4 liters oxygen later this week.

## 2012-03-12 ENCOUNTER — Telehealth: Payer: Self-pay | Admitting: Pulmonary Disease

## 2012-03-12 NOTE — Telephone Encounter (Signed)
Dg Chest 2 View  03/07/2012  *RADIOLOGY REPORT*  Clinical Data: Evaluate pneumonia, asthma  CHEST - 2 VIEW  Comparison: 12/22/2011; 12/02/2011; 06/11/2004  Findings:  The examination is degraded secondary to patient body habitus.  Grossly unchanged borderline enlarged cardiac silhouette and mediastinal contours.  Improved aeration of the right lower lung. No focal airspace opacities.  No pleural effusion or pneumothorax. Grossly unchanged bones.  Post cholecystectomy.  IMPRESSION: Resolved right lower lung possible airspace opacities suggests resolved pneumonia. No acute cardiopulmonary diseases.   Original Report Authenticated By: Waynard Reeds, M.D.    Will have my nurse inform pt that CXR looks much better.  Previous pneumonia has completely cleared.  No change to current treatment plan.

## 2012-03-13 NOTE — Telephone Encounter (Signed)
I spoke with patient about results and she verbalized understanding and had no questions 

## 2012-04-11 ENCOUNTER — Ambulatory Visit (INDEPENDENT_AMBULATORY_CARE_PROVIDER_SITE_OTHER): Payer: Medicare Other | Admitting: Pulmonary Disease

## 2012-04-11 ENCOUNTER — Encounter: Payer: Self-pay | Admitting: Pulmonary Disease

## 2012-04-11 VITALS — BP 124/86 | HR 87 | Temp 98.2°F | Ht 66.0 in | Wt 279.6 lb

## 2012-04-11 DIAGNOSIS — J9610 Chronic respiratory failure, unspecified whether with hypoxia or hypercapnia: Secondary | ICD-10-CM

## 2012-04-11 DIAGNOSIS — J4530 Mild persistent asthma, uncomplicated: Secondary | ICD-10-CM

## 2012-04-11 DIAGNOSIS — G4733 Obstructive sleep apnea (adult) (pediatric): Secondary | ICD-10-CM

## 2012-04-11 DIAGNOSIS — J069 Acute upper respiratory infection, unspecified: Secondary | ICD-10-CM | POA: Insufficient documentation

## 2012-04-11 DIAGNOSIS — J45909 Unspecified asthma, uncomplicated: Secondary | ICD-10-CM

## 2012-04-11 NOTE — Assessment & Plan Note (Signed)
Improved with transition to BPAP 17/11 with 4 liters oxygen.

## 2012-04-11 NOTE — Assessment & Plan Note (Signed)
She is to continue 4 liters oxygen. 

## 2012-04-11 NOTE — Assessment & Plan Note (Signed)
She had recent URI tx with zithromax by PCP.  She is improving clinically.  There is no clinical evidence to suggest pneumonia.  I don't think she needs another chest xray today.  Advised her to d/w PCP if her diarrhea persists.

## 2012-04-11 NOTE — Progress Notes (Signed)
Chief Complaint  Patient presents with  . Follow-up    c/o cough w/ yellow phlem, low grade temp 99-99.2, slight wheezing--using nebulizers and it helps. no chest tx, SOB. takes hycodan but doesn't help--finshed ZPAk 2 days ago from Dr. Duanne Guess.   . Sleep Apnea    wears bpap everynight. denies any problems w/ mask/machine    History of Present Illness: Shelley [name] is a 46 y.o. female with OSA, OHS, and asthma.  She developed a cough with yellow sputum and temp up to 58F.  She was given Zpak from her PCP.  She still has occasional cough, but not as much as before.    She has been getting frequent bowel movements.  Her last BM was yesterday.  She has noticed cramps in her legs.  She is not needing to use her albuterol much.  She is sleeping better since BPAP settings were changes.  Tests: PSG 09/13/10>>AHI 98.6, SpO2 60% ONO with CPAP and 3 liters 01/13/12>>Test time 7 hrs 6 min. Mean SpO2 89%, low SpO2 62%. Spent 2 hrs 34 min with SpO2 < 88% CPAP 12/16/11 to 01/14/12>>Used on 6 of 30 nights with average 7 hrs 3 min. Average AHI 1.8 with CPAP 10 cm H2O. BPAP 02/20/12>>17/11 cm H2O with 4 liters oxygen.  Past Medical History  Diagnosis Date  . Asthma   . Diabetes mellitus   . Hypertension   . Hyperlipidemia   . Pneumonia   . Anxiety   . GERD (gastroesophageal reflux disease)   . Arthritis   . Mental retardation     Past Surgical History  Procedure Date  . Cholecystectomy     2006    Outpatient Encounter Prescriptions as of 04/11/2012  Medication Sig Dispense Refill  . albuterol (PROVENTIL HFA;VENTOLIN HFA) 108 (90 BASE) MCG/ACT inhaler Inhale 2 puffs into the lungs every 6 (six) hours as needed. For shortness of breath.      Marland Kitchen albuterol (PROVENTIL) (2.5 MG/3ML) 0.083% nebulizer solution Take 3 mLs (2.5 mg total) by nebulization every 4 (four) hours as needed for wheezing or shortness of breath. DX: 493.90  360 mL  5  . budesonide (PULMICORT) 0.25 MG/2ML nebulizer solution  Take 2 mLs (0.25 mg total) by nebulization 2 (two) times daily. DX: 493.90  60 mL  5  . docusate sodium (COLACE) 100 MG capsule Take 100 mg by mouth daily.      . ergocalciferol (VITAMIN D2) 50000 UNITS capsule Take 50,000 Units by mouth once a week.      . fenofibrate 160 MG tablet Take 160 mg by mouth daily.      Marland Kitchen HYDROcodone-homatropine (HYCODAN) 5-1.5 MG/5ML syrup Take 5 mLs by mouth every 6 (six) hours as needed.      . loratadine (CLARITIN) 10 MG tablet Take 10 mg by mouth daily as needed. For allergies      . losartan-hydrochlorothiazide (HYZAAR) 100-25 MG per tablet Take 1 tablet by mouth daily.      . metFORMIN (GLUCOPHAGE) 1000 MG tablet Take 1,000 mg by mouth daily with breakfast.      . montelukast (SINGULAIR) 10 MG tablet Take 10 mg by mouth at bedtime.        Allergies  Allergen Reactions  . Fish Allergy Swelling  . Pepto-Bismol (Bismuth Subsalicylate) Nausea And Vomiting  . Cortisone Itching and Rash    Injections and cream only.  PO ok.    Physical Exam:  Filed Vitals:   04/11/12 1024  Height: 5\' 6"  (1.676  m)  Weight: 279 lb 9.6 oz (126.826 kg)    Body mass index is 45.13 kg/(m^2).   Wt Readings from Last 2 Encounters:  04/11/12 279 lb 9.6 oz (126.826 kg)  03/07/12 284 lb 6.4 oz (129.003 kg)    General - No distress, wearing oxygen ENT - No sinus tenderness, MP 4, no oral exudate, no LAN Cardiac - s1s2 regular, no murmur Chest - normal respiratory excursion, good air entry, no wheeze/rales/dullness Back - no focal tenderness Abd - soft, non-tender, no organomegaly, + bowel sounds Ext - no edema Neuro - normal strengt Skin - no rashes Psych - normal mood, and behavior.    Assessment/Plan:  [name], MD [place] [facility]/Critical [facility]/Sleep Pager:  [phone] 04/11/2012, 10:45 AM

## 2012-04-11 NOTE — Patient Instructions (Signed)
Follow up in 6 months 

## 2012-04-11 NOTE — Assessment & Plan Note (Signed)
Stable on her current regimen. 

## 2012-05-15 DIAGNOSIS — F329 Major depressive disorder, single episode, unspecified: Secondary | ICD-10-CM | POA: Insufficient documentation

## 2012-05-29 ENCOUNTER — Telehealth: Payer: Self-pay | Admitting: Pulmonary Disease

## 2012-05-29 NOTE — Telephone Encounter (Signed)
i spoke with pt and advised her do not see where anyone in our office has tried to contact her. She voiced her understanding and needed nothing further

## 2012-10-09 ENCOUNTER — Emergency Department (HOSPITAL_COMMUNITY): Payer: Medicare Other

## 2012-10-09 ENCOUNTER — Encounter (HOSPITAL_COMMUNITY): Payer: Self-pay | Admitting: Emergency Medicine

## 2012-10-09 ENCOUNTER — Emergency Department (HOSPITAL_COMMUNITY)
Admission: EM | Admit: 2012-10-09 | Discharge: 2012-10-09 | Disposition: A | Discharge status: Home / Self Care | Payer: Medicare Other | Attending: Emergency Medicine | Admitting: Emergency Medicine

## 2012-10-09 DIAGNOSIS — Y9210 Unspecified residential institution as the place of occurrence of the external cause: Secondary | ICD-10-CM | POA: Insufficient documentation

## 2012-10-09 DIAGNOSIS — F411 Generalized anxiety disorder: Secondary | ICD-10-CM | POA: Insufficient documentation

## 2012-10-09 DIAGNOSIS — S42302A Unspecified fracture of shaft of humerus, left arm, initial encounter for closed fracture: Secondary | ICD-10-CM

## 2012-10-09 DIAGNOSIS — J45909 Unspecified asthma, uncomplicated: Secondary | ICD-10-CM | POA: Insufficient documentation

## 2012-10-09 DIAGNOSIS — E119 Type 2 diabetes mellitus without complications: Secondary | ICD-10-CM | POA: Insufficient documentation

## 2012-10-09 DIAGNOSIS — S42209A Unspecified fracture of upper end of unspecified humerus, initial encounter for closed fracture: Secondary | ICD-10-CM | POA: Insufficient documentation

## 2012-10-09 DIAGNOSIS — Y92129 Unspecified place in nursing home as the place of occurrence of the external cause: Secondary | ICD-10-CM

## 2012-10-09 DIAGNOSIS — Z8639 Personal history of other endocrine, nutritional and metabolic disease: Secondary | ICD-10-CM | POA: Insufficient documentation

## 2012-10-09 DIAGNOSIS — W1809XA Striking against other object with subsequent fall, initial encounter: Secondary | ICD-10-CM | POA: Insufficient documentation

## 2012-10-09 DIAGNOSIS — W19XXXA Unspecified fall, initial encounter: Secondary | ICD-10-CM

## 2012-10-09 DIAGNOSIS — Z8719 Personal history of other diseases of the digestive system: Secondary | ICD-10-CM | POA: Insufficient documentation

## 2012-10-09 DIAGNOSIS — Z862 Personal history of diseases of the blood and blood-forming organs and certain disorders involving the immune mechanism: Secondary | ICD-10-CM | POA: Insufficient documentation

## 2012-10-09 DIAGNOSIS — Z79899 Other long term (current) drug therapy: Secondary | ICD-10-CM | POA: Insufficient documentation

## 2012-10-09 DIAGNOSIS — I10 Essential (primary) hypertension: Secondary | ICD-10-CM | POA: Insufficient documentation

## 2012-10-09 DIAGNOSIS — Z8701 Personal history of pneumonia (recurrent): Secondary | ICD-10-CM | POA: Insufficient documentation

## 2012-10-09 DIAGNOSIS — Z8739 Personal history of other diseases of the musculoskeletal system and connective tissue: Secondary | ICD-10-CM | POA: Insufficient documentation

## 2012-10-09 DIAGNOSIS — Z8669 Personal history of other diseases of the nervous system and sense organs: Secondary | ICD-10-CM | POA: Insufficient documentation

## 2012-10-09 DIAGNOSIS — Y9301 Activity, walking, marching and hiking: Secondary | ICD-10-CM | POA: Insufficient documentation

## 2012-10-09 MED ORDER — HYDROCODONE-ACETAMINOPHEN 5-325 MG PO TABS: 1.0000 | ORAL_TABLET | ORAL | Status: DC | PRN

## 2012-10-09 NOTE — ED Notes (Signed)
OZH:[id number]<HQ> Expected date:<BR> Expected time:<BR> Means of arrival:<BR> Comments:<BR> 47 y/o F fall

## 2012-10-09 NOTE — ED Provider Notes (Signed)
History     CSN: [id number]  Arrival date & time 10/09/12  1002   First MD Initiated Contact with Patient 10/09/12 1013      Chief Complaint  Patient presents with  . Fall    (Consider location/radiation/quality/duration/timing/severity/associated sxs/prior treatment) Patient is a 47 y.o. female presenting with fall. The history is provided by the patient and medical records. No language interpreter was used.  Fall The accident occurred less than 1 hour ago. The fall occurred while walking. Distance fallen: At ground-level. She landed on carpet. There was no blood loss. Point of impact: Left arm. Pain location: Left arm. The pain is at a severity of 4/10. She was ambulatory at the scene. There was no entrapment after the fall. There was no drug use involved in the accident. There was no alcohol use involved in the accident. Pertinent negatives include no visual change, no fever, no numbness, no abdominal pain, no bowel incontinence, no headaches, no loss of consciousness and no tingling. The symptoms are aggravated by pressure on the injury. She has tried nothing for the symptoms.    Shelley [name] is a 47 y.o. female  with a hx of asthma on oxygen, DM, HLD, MR presents to the Emergency Department complaining of acute onset left arm pain after mechanical fall 30 minutes prior to arrival. Patient lives in a nursing home states she tripped on her foot and fell. She is not sure that she caught herself with her arm has no headache, neck pain or back pain. She denies loss of consciousness. Associated symptoms include pain with movement of the left arm  holding the arm still makes it feel some better and movement and palpation makes it worse. Patient denies fever, chills, headache, neck pain, back pain, chest pain, shortness of breath, abdominal pain, nausea, vomiting, diarrhea, weakness, numbness, tingling, dizziness, syncope, dysuria  Past Medical History  Diagnosis Date  . Asthma   . Diabetes  mellitus   . Hypertension   . Hyperlipidemia   . Pneumonia   . Anxiety   . GERD (gastroesophageal reflux disease)   . Arthritis   . Mental retardation     Past Surgical History  Procedure Laterality Date  . Cholecystectomy      2006    No family history on file.  History  Substance Use Topics  . Smoking status: Never Smoker   . Smokeless tobacco: Never Used  . Alcohol Use: No    OB History   Grav Para Term Preterm Abortions TAB SAB Ect Mult Living                  Review of Systems  Constitutional: Negative for fever and chills.  HENT: Negative for neck pain and neck stiffness.   Respiratory: Negative for chest tightness.   Cardiovascular: Negative for chest pain.  Gastrointestinal: Negative for abdominal pain and bowel incontinence.  Genitourinary: Negative for flank pain.  Musculoskeletal: Positive for arthralgias. Negative for back pain, joint swelling and gait problem.  Skin: Negative for wound.  Allergic/Immunologic: Negative for immunocompromised state.  Neurological: Negative for tingling, loss of consciousness, numbness and headaches.  Hematological: Does not bruise/bleed easily.  Psychiatric/Behavioral: The patient is not nervous/anxious.   All other systems reviewed and are negative.    Allergies  Fish allergy; Pepto-bismol; and Cortisone  Home Medications   Current Outpatient Rx  Name  Route  Sig  Dispense  Refill  . acetaminophen (TYLENOL) 500 MG tablet   Oral   Take  500 mg by mouth every 6 (six) hours as needed for pain.         . budesonide (PULMICORT) 0.25 MG/2ML nebulizer solution   Nebulization   Take 2 mLs (0.25 mg total) by nebulization 2 (two) times daily. DX: 493.90   60 mL   5   . ergocalciferol (VITAMIN D2) 50000 UNITS capsule   Oral   Take 50,000 Units by mouth once a week.         . fenofibrate 160 MG tablet   Oral   Take 160 mg by mouth daily.         Marland Kitchen loratadine (CLARITIN) 10 MG tablet   Oral   Take 10 mg by  mouth daily as needed. For allergies         . losartan-hydrochlorothiazide (HYZAAR) 100-25 MG per tablet   Oral   Take 1 tablet by mouth daily.         . metFORMIN (GLUCOPHAGE) 1000 MG tablet   Oral   Take 1,000 mg by mouth daily with breakfast.         . montelukast (SINGULAIR) 10 MG tablet   Oral   Take 10 mg by mouth at bedtime.         Marland Kitchen HYDROcodone-acetaminophen (NORCO/VICODIN) 5-325 MG per tablet   Oral   Take 1 tablet by mouth every 4 (four) hours as needed for pain.   6 tablet   0     BP 135/53  Pulse 95  Temp(Src) 98.6 F (37 C) (Oral)  Resp 20  SpO2 99%  LMP 09/27/2012  Physical Exam  Nursing note and vitals reviewed. Constitutional: She is oriented to person, place, and time. She appears well-developed and well-nourished. No distress.  HENT:  Head: Normocephalic and atraumatic.  Mouth/Throat: Oropharynx is clear and moist.  Eyes: Conjunctivae and EOM are normal. Pupils are equal, round, and reactive to light.  Neck: Normal range of motion and full passive range of motion without pain. No spinous process tenderness and no muscular tenderness present. No rigidity. Normal range of motion present.  Cardiovascular: Normal rate, regular rhythm, normal heart sounds and intact distal pulses.   No murmur heard. Capillary refill capillary refill  Pulmonary/Chest: Effort normal and breath sounds normal. No respiratory distress.  Pt with home oxygen in place  Abdominal: Soft. Bowel sounds are normal. She exhibits no distension. There is no tenderness. There is no rebound.  Musculoskeletal: She exhibits tenderness. She exhibits no edema.       Left elbow: She exhibits normal range of motion, no swelling, no effusion, no deformity and no laceration. No tenderness found.       Left wrist: She exhibits normal range of motion, no tenderness, no bony tenderness, no swelling, no effusion, no crepitus, no deformity and no laceration.       Left upper arm: She exhibits  tenderness (very TTP in the area of the proximal humerus) and swelling (mild). She exhibits no edema, no deformity and no laceration.       Left forearm: She exhibits no tenderness, no swelling, no edema and no deformity.       Left hand: She exhibits normal range of motion, no tenderness, no bony tenderness, normal two-point discrimination, normal capillary refill, no deformity, no laceration and no swelling. Normal sensation noted. Normal strength noted.  ROM: Decreased range of motion of the left shoulder and upper arm secondary to pain.  Lymphadenopathy:    She has no cervical adenopathy.  Neurological:  She is alert and oriented to person, place, and time. No cranial nerve deficit. She exhibits normal muscle tone. Coordination normal.  Speech is clear and goal oriented, follows commands Major Cranial nerves without deficit, Decreased strength in Left shoulder and upper extremity 2/2/pain Normal strength in lower extremities bilaterally including dorsiflexion and plantar flexion  strong and equal grip strength Sensation normal to light and sharp touch Normal gait and balance  Skin: Skin is warm and dry. No rash noted. She is not diaphoretic. No erythema.  No tenting of the skin  Psychiatric: She has a normal mood and affect.    ED Course  Procedures (including critical care time)  Labs Reviewed - No data to display Dg Humerus Left  10/09/2012  *RADIOLOGY REPORT*  Clinical Data: Fall, pain  LEFT HUMERUS - 2+ VIEW  Comparison: None.  Findings: Three views of the left humerus submitted.  There is minimal displaced probable comminuted fracture proximal left humerus.  IMPRESSION: Minimal displaced probable comminuted fracture proximal left humerus.   Original Report Authenticated By: Natasha Mead, M.D.      1. Fall at nursing home, initial encounter   2. Humerus fracture, left, closed, initial encounter       MDM  Vickii Penna presents with pain in the L arm after fall.  Pt without  neck/back pain, headache or reported LOC.  Pt with normal neurologic exam and no gait disturbance; no indication of closed head injury. Pt x-ray with minimal displaced probable comminuted fracture proximal left Humerus.  I personally reviewed the imaging tests through PACS system.  I reviewed available ER/hospitalization records through the EMR. Will swelling patient arm with immobilizer and have her followup with orthopedics tomorrow morning.  I have also discussed reasons to return immediately to the ER.  Patient and caregiver express understanding and agree with plan.     Dr. Donnetta Hutching was consulted, evaluated this patient with me and agrees with the plan.               Dahlia Client Jema Deegan, PA-C 10/09/12 2106

## 2012-10-09 NOTE — ED Notes (Signed)
Pt presenting to ed with c/o falling while walking to get her hair done this morning pt lives in an assisted living facility. Pt with c/o left shoulder pain. Pt with no loc. Pt is alert and oriented

## 2012-10-10 NOTE — ED Provider Notes (Signed)
Medical screening examination/treatment/procedure(s) were conducted as a shared visit with non-physician practitioner(s) and myself.  I personally evaluated the patient during the encounter.  Minimally displaced proximal left humeral fracture.  Immobilized. Pain management. Refer to orthopedics  Donnetta Hutching, MD 10/10/12 2017

## 2012-10-26 ENCOUNTER — Ambulatory Visit: Payer: Self-pay | Admitting: Pulmonary Disease

## 2012-11-08 ENCOUNTER — Ambulatory Visit (INDEPENDENT_AMBULATORY_CARE_PROVIDER_SITE_OTHER): Payer: Medicare Other | Admitting: Pulmonary Disease

## 2012-11-08 ENCOUNTER — Encounter: Payer: Self-pay | Admitting: Pulmonary Disease

## 2012-11-08 VITALS — BP 114/76 | HR 110 | Temp 98.6°F | Ht 66.0 in | Wt 271.0 lb

## 2012-11-08 DIAGNOSIS — J4530 Mild persistent asthma, uncomplicated: Secondary | ICD-10-CM

## 2012-11-08 DIAGNOSIS — G4733 Obstructive sleep apnea (adult) (pediatric): Secondary | ICD-10-CM

## 2012-11-08 DIAGNOSIS — J45909 Unspecified asthma, uncomplicated: Secondary | ICD-10-CM

## 2012-11-08 DIAGNOSIS — J9610 Chronic respiratory failure, unspecified whether with hypoxia or hypercapnia: Secondary | ICD-10-CM

## 2012-11-08 DIAGNOSIS — J309 Allergic rhinitis, unspecified: Secondary | ICD-10-CM

## 2012-11-08 DIAGNOSIS — R5381 Other malaise: Secondary | ICD-10-CM

## 2012-11-08 MED ORDER — ALBUTEROL SULFATE (2.5 MG/3ML) 0.083% IN NEBU: 2.5000 mg | INHALATION_SOLUTION | Freq: Four times a day (QID) | RESPIRATORY_TRACT | Status: DC | PRN

## 2012-11-08 NOTE — Assessment & Plan Note (Signed)
She is to continue schedule pulmicort and singulair with prn albuterol.

## 2012-11-08 NOTE — Assessment & Plan Note (Signed)
Continue singulair and prn claritin.

## 2012-11-08 NOTE — Assessment & Plan Note (Signed)
She is to continue with 4 liters oxygen 24/7.

## 2012-11-08 NOTE — Progress Notes (Signed)
Chief Complaint  Patient presents with  . Follow-up    Pt states her breathing has bee doing okay but when she does very little walking she is very wore out. Pt is doing breathing TX's BID but has not been wheezing and denies any chest tx  . Sleep Apnea    Pt states she has not worn BIPAP in several weeks bc she broke her arm. She is wearing 4 liters O2 everynight and has been sleeping fine.     History of Present Illness: Shelley [name] is a 47 y.o. female with OSA, OHS, and asthma.  She has not been consistent with using her nebulizer.  She gets wheezing at night more if she forgets to use her nebulizer.  She has cough with sputum in the morning, but not much else during the day.  She denies sinus congestion or headache.  She uses her BiPAP and this helps.  She uses 4 liters oxygen 24/7.  She gets winded and hot with minimal exertion.  As a result she is not very active.  Tests: PSG 09/13/10>>AHI 98.6, SpO2 60% ONO with CPAP and 3 liters 01/13/12>>Test time 7 hrs 6 min. Mean SpO2 89%, low SpO2 62%. Spent 2 hrs 34 min with SpO2 < 88% CPAP 12/16/11 to 01/14/12>>Used on 6 of 30 nights with average 7 hrs 3 min. Average AHI 1.8 with CPAP 10 cm H2O. BPAP 02/20/12>>17/11 cm H2O with 4 liters oxygen.  She  has a past medical history of Asthma; Diabetes mellitus; Hypertension; Hyperlipidemia; Pneumonia; Anxiety; GERD (gastroesophageal reflux disease); Arthritis; and Mental retardation.  She  has past surgical history that includes Cholecystectomy.   Outpatient Encounter Prescriptions as of 11/08/2012  Medication Sig Dispense Refill  . acetaminophen (TYLENOL) 500 MG tablet Take 500 mg by mouth every 6 (six) hours as needed for pain.      . budesonide (PULMICORT) 0.25 MG/2ML nebulizer solution Take 2 mLs (0.25 mg total) by nebulization 2 (two) times daily. DX: 493.90  60 mL  5  . fenofibrate 160 MG tablet Take 160 mg by mouth daily.      Marland Kitchen loratadine (CLARITIN) 10 MG tablet Take 10 mg by mouth  daily as needed. For allergies      . losartan-hydrochlorothiazide (HYZAAR) 100-25 MG per tablet Take 1 tablet by mouth daily.      . metFORMIN (GLUCOPHAGE) 1000 MG tablet Take 1,000 mg by mouth daily with breakfast.      . montelukast (SINGULAIR) 10 MG tablet Take 10 mg by mouth at bedtime.      Marland Kitchen albuterol (PROVENTIL) (2.5 MG/3ML) 0.083% nebulizer solution Take 3 mLs (2.5 mg total) by nebulization every 6 (six) hours as needed for wheezing or shortness of breath.  120 vial  5  . ergocalciferol (VITAMIN D2) 50000 UNITS capsule Take 50,000 Units by mouth once a week.      . [DISCONTINUED] HYDROcodone-acetaminophen (NORCO/VICODIN) 5-325 MG per tablet Take 1 tablet by mouth every 4 (four) hours as needed for pain.  6 tablet  0   No facility-administered encounter medications on file as of 11/08/2012.    Allergies  Allergen Reactions  . Fish Allergy Swelling  . Pepto-Bismol (Bismuth Subsalicylate) Nausea And Vomiting  . Cortisone Itching and Rash    Injections and cream only.  PO ok.    Physical Exam:  General - No distress, wearing oxygen ENT - No sinus tenderness, MP 4, no oral exudate, no LAN Cardiac - s1s2 regular, no murmur Chest - decreased  breath sounds, no wheeze/rales Back - no focal tenderness Abd - soft, non-tender Ext - no edema Neuro - normal strength Skin - no rashes Psych - pleasant demeanor    Assessment/Plan:  [name], MD [facility] Pager:  [phone] 11/08/2012, 10:41 AM

## 2012-11-08 NOTE — Patient Instructions (Signed)
Follow up in 6 months 

## 2012-11-08 NOTE — Assessment & Plan Note (Signed)
Stable on BiPAP.  She is compliant and reports benefit.

## 2012-11-08 NOTE — Assessment & Plan Note (Signed)
Discussed importance of regular exercise regimen to improve her stamina and assist with weight loss.  This in turn will improve her respiratory status.

## 2013-02-01 ENCOUNTER — Other Ambulatory Visit: Payer: Self-pay | Admitting: Family Medicine

## 2013-02-01 DIAGNOSIS — R109 Unspecified abdominal pain: Secondary | ICD-10-CM

## 2013-02-05 ENCOUNTER — Encounter (HOSPITAL_COMMUNITY): Payer: Self-pay | Admitting: Emergency Medicine

## 2013-02-05 ENCOUNTER — Inpatient Hospital Stay (HOSPITAL_COMMUNITY)
Admission: EM | Admit: 2013-02-05 | Discharge: 2013-02-06 | DRG: 378 | Disposition: A | Discharge status: Home / Self Care | Payer: Medicare Other | Attending: Internal Medicine | Admitting: Internal Medicine

## 2013-02-05 DIAGNOSIS — J45909 Unspecified asthma, uncomplicated: Secondary | ICD-10-CM | POA: Diagnosis present

## 2013-02-05 DIAGNOSIS — J9610 Chronic respiratory failure, unspecified whether with hypoxia or hypercapnia: Secondary | ICD-10-CM | POA: Diagnosis present

## 2013-02-05 DIAGNOSIS — Z79899 Other long term (current) drug therapy: Secondary | ICD-10-CM

## 2013-02-05 DIAGNOSIS — Z66 Do not resuscitate: Secondary | ICD-10-CM | POA: Diagnosis present

## 2013-02-05 DIAGNOSIS — E119 Type 2 diabetes mellitus without complications: Secondary | ICD-10-CM | POA: Diagnosis present

## 2013-02-05 DIAGNOSIS — I10 Essential (primary) hypertension: Secondary | ICD-10-CM | POA: Diagnosis present

## 2013-02-05 DIAGNOSIS — Z9981 Dependence on supplemental oxygen: Secondary | ICD-10-CM

## 2013-02-05 DIAGNOSIS — K644 Residual hemorrhoidal skin tags: Secondary | ICD-10-CM | POA: Diagnosis present

## 2013-02-05 DIAGNOSIS — K5730 Diverticulosis of large intestine without perforation or abscess without bleeding: Secondary | ICD-10-CM | POA: Diagnosis present

## 2013-02-05 DIAGNOSIS — K219 Gastro-esophageal reflux disease without esophagitis: Secondary | ICD-10-CM | POA: Diagnosis present

## 2013-02-05 DIAGNOSIS — E785 Hyperlipidemia, unspecified: Secondary | ICD-10-CM | POA: Diagnosis present

## 2013-02-05 DIAGNOSIS — Z9119 Patient's noncompliance with other medical treatment and regimen: Secondary | ICD-10-CM

## 2013-02-05 DIAGNOSIS — D509 Iron deficiency anemia, unspecified: Secondary | ICD-10-CM

## 2013-02-05 DIAGNOSIS — D500 Iron deficiency anemia secondary to blood loss (chronic): Secondary | ICD-10-CM | POA: Diagnosis present

## 2013-02-05 DIAGNOSIS — K625 Hemorrhage of anus and rectum: Principal | ICD-10-CM | POA: Diagnosis present

## 2013-02-05 DIAGNOSIS — K5520 Angiodysplasia of colon without hemorrhage: Secondary | ICD-10-CM | POA: Diagnosis present

## 2013-02-05 DIAGNOSIS — K449 Diaphragmatic hernia without obstruction or gangrene: Secondary | ICD-10-CM | POA: Diagnosis present

## 2013-02-05 DIAGNOSIS — E876 Hypokalemia: Secondary | ICD-10-CM | POA: Diagnosis present

## 2013-02-05 DIAGNOSIS — Z91199 Patient's noncompliance with other medical treatment and regimen due to unspecified reason: Secondary | ICD-10-CM

## 2013-02-05 DIAGNOSIS — G4733 Obstructive sleep apnea (adult) (pediatric): Secondary | ICD-10-CM | POA: Diagnosis present

## 2013-02-05 DIAGNOSIS — E872 Acidosis, unspecified: Secondary | ICD-10-CM | POA: Diagnosis present

## 2013-02-05 DIAGNOSIS — F411 Generalized anxiety disorder: Secondary | ICD-10-CM | POA: Diagnosis present

## 2013-02-05 DIAGNOSIS — R7309 Other abnormal glucose: Secondary | ICD-10-CM

## 2013-02-05 DIAGNOSIS — F70 Mild intellectual disabilities: Secondary | ICD-10-CM | POA: Diagnosis present

## 2013-02-05 DIAGNOSIS — K648 Other hemorrhoids: Secondary | ICD-10-CM | POA: Diagnosis present

## 2013-02-05 DIAGNOSIS — Z6839 Body mass index (BMI) 39.0-39.9, adult: Secondary | ICD-10-CM

## 2013-02-05 DIAGNOSIS — D649 Anemia, unspecified: Secondary | ICD-10-CM

## 2013-02-05 HISTORY — DX: Diverticulosis of intestine, part unspecified, without perforation or abscess without bleeding: K57.90

## 2013-02-05 HISTORY — DX: Diaphragmatic hernia without obstruction or gangrene: K44.9

## 2013-02-05 HISTORY — DX: Unspecified hemorrhoids: K64.9

## 2013-02-05 HISTORY — DX: Angiodysplasia of colon without hemorrhage: K55.20

## 2013-02-05 LAB — COMPREHENSIVE METABOLIC PANEL
Albumin: 3.5 g/dL (ref 3.5–5.2)
CO2: 41 mEq/L (ref 19–32)
Glucose, Bld: 132 mg/dL — ABNORMAL HIGH (ref 70–99)
Potassium: 3.1 mEq/L — ABNORMAL LOW (ref 3.5–5.1)
Sodium: 138 mEq/L (ref 135–145)
Total Bilirubin: 0.2 mg/dL — ABNORMAL LOW (ref 0.3–1.2)
Total Protein: 8 g/dL (ref 6.0–8.3)

## 2013-02-05 LAB — CBC WITH DIFFERENTIAL/PLATELET
Basophils Relative: 0 % (ref 0–1)
Eosinophils Relative: 1 % (ref 0–5)
HCT: 25.6 % — ABNORMAL LOW (ref 36.0–46.0)
Hemoglobin: 6.5 g/dL — CL (ref 12.0–15.0)
Lymphocytes Relative: 45 % (ref 12–46)
Monocytes Relative: 12 % (ref 3–12)
Neutro Abs: 2.8 10*3/uL (ref 1.7–7.7)
Neutrophils Relative %: 42 % — ABNORMAL LOW (ref 43–77)
RBC: 3.68 MIL/uL — ABNORMAL LOW (ref 3.87–5.11)
WBC: 6.7 10*3/uL (ref 4.0–10.5)

## 2013-02-05 LAB — ABO/RH: ABO/RH(D): A POS

## 2013-02-05 MED ORDER — SERTRALINE HCL 50 MG PO TABS
50.0000 mg | ORAL_TABLET | Freq: Every day | ORAL | Status: DC
  Administered 2013-02-06: 50 mg via ORAL
  Filled 2013-02-05: qty 1

## 2013-02-05 MED ORDER — PEG 3350-KCL-NA BICARB-NACL 420 G PO SOLR
4000.0000 mL | Freq: Once | ORAL | Status: AC
  Administered 2013-02-05: 4000 mL via ORAL
  Filled 2013-02-05: qty 4000

## 2013-02-05 MED ORDER — SODIUM CHLORIDE 0.9 % IV SOLN
INTRAVENOUS | Status: DC
  Administered 2013-02-05: 20:00:00 via INTRAVENOUS

## 2013-02-05 MED ORDER — BUDESONIDE 0.25 MG/2ML IN SUSP
0.2500 mg | Freq: Two times a day (BID) | RESPIRATORY_TRACT | Status: DC
  Administered 2013-02-05 – 2013-02-06 (×2): 0.25 mg via RESPIRATORY_TRACT
  Filled 2013-02-05 (×4): qty 2

## 2013-02-05 MED ORDER — MONTELUKAST SODIUM 10 MG PO TABS
10.0000 mg | ORAL_TABLET | Freq: Every day | ORAL | Status: DC
  Administered 2013-02-05: 10 mg via ORAL
  Filled 2013-02-05 (×2): qty 1

## 2013-02-05 MED ORDER — MORPHINE SULFATE 4 MG/ML IJ SOLN: 4.0000 mg | Freq: Once | INTRAMUSCULAR | Status: DC

## 2013-02-05 MED ORDER — FENOFIBRATE 160 MG PO TABS
160.0000 mg | ORAL_TABLET | Freq: Every day | ORAL | Status: DC
  Administered 2013-02-06: 160 mg via ORAL
  Filled 2013-02-05: qty 1

## 2013-02-05 MED ORDER — POTASSIUM CHLORIDE CRYS ER 20 MEQ PO TBCR
40.0000 meq | EXTENDED_RELEASE_TABLET | Freq: Once | ORAL | Status: AC
  Administered 2013-02-05: 40 meq via ORAL
  Filled 2013-02-05: qty 2

## 2013-02-05 NOTE — Consult Note (Signed)
Reason for Consult: Hematochezia and Anemia Referring Physician: [facility]  Shelley [name] HPI: This is a 47 year old female admitted for hematochezia with resultant anemia.  The patient was evaluated by Dr. Duanne [name] for complaints of dizziness for the past month and she was noted to be anemic.  Her HGB upon admission was in the 6 range and a microcytosis.  Per her caregiver she complained of intermittent hematochezia for the past several weeks.  She had a colonoscopy in 2011 for some minor complaints of hematochezia.  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  No reports of melena with her bleeding.  She does complain of some proctalgia with a bowel movement.  No complaints of chest pain or SOB, but she does have issues with OSA and she uses a CPAP at night.  Past Medical History  Diagnosis Date  . Asthma   . Diabetes mellitus   . Hypertension   . Hyperlipidemia   . Pneumonia   . Anxiety   . GERD (gastroesophageal reflux disease)   . Arthritis   . Mental retardation     Past Surgical History  Procedure Laterality Date  . Cholecystectomy      2006    History reviewed. No pertinent family history.  Social History:  reports that she has never smoked. She has never used smokeless tobacco. She reports that she does not drink alcohol or use illicit drugs.  Allergies:  Allergies  Allergen Reactions  . Fish Allergy Swelling  . Pepto-Bismol [Bismuth Subsalicylate] Nausea And Vomiting  . Cortisone Itching and Rash    Injections and cream only.  PO ok.    Medications:  Scheduled: .  morphine injection  4 mg Intravenous Once   Continuous:   Results for orders placed during the hospital encounter of 02/05/13 (from the past 24 hour(s))  CBC WITH DIFFERENTIAL     Status: Abnormal   Collection Time    02/05/13  1:11 PM      Result Value Range   WBC 6.7  4.0 - 10.5 K/uL   RBC 3.68 (*) 3.87 - 5.11 MIL/uL   Hemoglobin 6.5 (*) 12.0 - 15.0 g/dL    HCT 81.1 (*) 91.4 - 46.0 %   MCV 69.6 (*) 78.0 - 100.0 fL   MCH 17.7 (*) 26.0 - 34.0 pg   MCHC 25.4 (*) 30.0 - 36.0 g/dL   RDW 78.2 (*) 95.6 - 21.3 %   Platelets 432 (*) 150 - 400 K/uL   Neutrophils Relative % 42 (*) 43 - 77 %   Lymphocytes Relative 45  12 - 46 %   Monocytes Relative 12  3 - 12 %   Eosinophils Relative 1  0 - 5 %   Basophils Relative 0  0 - 1 %   Neutro Abs 2.8  1.7 - 7.7 K/uL   Lymphs Abs 3.0  0.7 - 4.0 K/uL   Monocytes Absolute 0.8  0.1 - 1.0 K/uL   Eosinophils Absolute 0.1  0.0 - 0.7 K/uL   Basophils Absolute 0.0  0.0 - 0.1 K/uL   RBC Morphology POLYCHROMASIA PRESENT     Smear Review LARGE PLATELETS PRESENT    COMPREHENSIVE METABOLIC PANEL     Status: Abnormal   Collection Time    02/05/13  1:11 PM      Result Value Range   Sodium 138  135 - 145 mEq/L   Potassium 3.1 (*) 3.5 - 5.1 mEq/L   Chloride 93 (*) 96 -  112 mEq/L   CO2 41 (*) 19 - 32 mEq/L   Glucose, Bld 132 (*) 70 - 99 mg/dL   BUN 11  6 - 23 mg/dL   Creatinine, Ser 1.61  0.50 - 1.10 mg/dL   Calcium 9.4  8.4 - 09.6 mg/dL   Total Protein 8.0  6.0 - 8.3 g/dL   Albumin 3.5  3.5 - 5.2 g/dL   AST 20  0 - 37 U/L   ALT 16  0 - 35 U/L   Alkaline Phosphatase 72  39 - 117 U/L   Total Bilirubin 0.2 (*) 0.3 - 1.2 mg/dL   GFR calc non Af Amer >90  >90 mL/min   GFR calc Af Amer >90  >90 mL/min  TYPE AND SCREEN     Status: None   Collection Time    02/05/13  1:11 PM      Result Value Range   ABO/RH(D) A POS     Antibody Screen NEG     Sample Expiration 02/08/2013     Unit Number [id number]     Blood Component Type RED CELLS,LR     Unit division 00     Status of Unit ALLOCATED     Transfusion Status OK TO TRANSFUSE     Crossmatch Result Compatible     Unit Number [id number]     Blood Component Type RED CELLS,LR     Unit division 00     Status of Unit ISSUED     Transfusion Status OK TO TRANSFUSE     Crossmatch Result Compatible    ABO/RH     Status: None   Collection Time    02/05/13  1:11  PM      Result Value Range   ABO/RH(D) A POS    PREPARE RBC (CROSSMATCH)     Status: None   Collection Time    02/05/13  2:00 PM      Result Value Range   Order Confirmation ORDER PROCESSED BY BLOOD BANK    OCCULT BLOOD, POC DEVICE     Status: None   Collection Time    02/05/13  2:01 PM      Result Value Range   Fecal Occult Bld NEGATIVE  NEGATIVE     No results found.  ROS:  As stated above in the HPI otherwise negative.  Blood pressure 106/54, pulse 94, temperature 98.4 F (36.9 C), temperature source Oral, resp. rate 20, height 5\' 7"  (1.702 m), weight 253 lb (114.76 kg), SpO2 100.00%.    PE: Gen: NAD, Alert and Oriented HEENT:  [place]/AT, EOMI Neck: Supple, no LAD Lungs: CTA Bilaterally CV: RRR without M/G/R ABM: Soft, NTND, +BS Ext: No C/C/E Rectal: Brown stool.  Assessment/Plan: 1) Anemia. 2) History of AVMs. 3) History of diverticula.   The patient has a microcytic anemia.  With her history of the cecal AVMs a repeat colonoscopy is warranted at this time.  I was expecting more of a melenic history.  Her bleeding could also be from her diverticula also.  If the colonoscopy is negative, I may pursue an EGD.  Plan: 1) Colonoscopy tomorrow. 2) Agree with the blood transfusion. 3) Follow HGB.  Shelley [name] D 02/05/2013, 3:54 PM

## 2013-02-05 NOTE — H&P (Signed)
[facility] History and Physical  Shelley [name] AVW:[id number] DOB: [birth year] DOA: 02/05/2013  Referring physician: Tiburcio Pea, PA PCP: Willow Ora, MD  Specialists: none so far  Chief Complaint: Acute GI bleed?   HPI: Shelley [name] is a 47 y.o. 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 Scoped by Dr. Elnoria Howard 2013 + internal hemorrhoids [no report in computer system], perimenopausal with rare periods but still contraception who was referred to the emergency room after visiting primary care physician's office, Dr. Maryelizabeth Rowan Patient gives history that she's been having on and off painless lower GI bleed, sometimes associated with loose stool.  Denies chest pain or shortness of breath-states that she has chronic shortness of breath however. Tolerating diet well without abdominal pain/vomiting/nausea-not orthostatic or dizzy and not having any lower extremity swelling Denies dysuria cough fever Denies chest pain. She lives at home with a caretaker who checks in on her and is non-compliant with her Bi-pap.  She is noted to be on chronic oxygen 4 L, but based on discussion with Dr. Duanne Guess, who patient has not seen in a year, she was apparently only on 2 L of oxygen during the day and this has steadily increased to the point that she is now on 4 L. She mentioned to emergency room PE that she was more short of breath than usual but at baseline according to her age, she does not do much activity at all and does not exercise  Review of Systems:  See above  Past Medical History  Diagnosis Date  . Asthma   . Diabetes mellitus   . Hypertension   . Hyperlipidemia   . Pneumonia   . Anxiety   . GERD (gastroesophageal reflux disease)   . Arthritis   . Mental retardation    Past Surgical  History  Procedure Laterality Date  . Cholecystectomy      2006   Social History:  reports that she has never smoked. She has never used smokeless tobacco. She reports that she does not drink alcohol or use illicit drugs. Lives at home by herself Has healthcare aide at home who comes to visit with her daily  Allergies  Allergen Reactions  . Fish Allergy Swelling  . Pepto-Bismol [Bismuth Subsalicylate] Nausea And Vomiting  . Cortisone Itching and Rash    Injections and cream only.  PO ok.    History reviewed. No pertinent family history.  Mother had history of rickets   Prior to Admission medications   Medication Sig Start Date End Date Taking? Authorizing Provider  acetaminophen (TYLENOL) 500 MG tablet Take 500 mg by mouth every 6 (six) hours as needed for pain.   Yes Historical Provider, MD  albuterol (PROVENTIL) (2.5 MG/3ML) 0.083% nebulizer solution Take 3 mLs (2.5 mg total) by nebulization every 6 (six) hours as needed for wheezing or shortness of breath. 11/08/12  Yes [name], MD  budesonide (PULMICORT) 0.25 MG/2ML nebulizer solution Take 2 mLs (0.25 mg total) by nebulization 2 (two) times daily. DX: 493.90 12/22/11 02/05/13 Yes Tammy S Parrett, NP  ergocalciferol (VITAMIN D2) 50000 UNITS capsule Take 50,000 Units by mouth once a week.   Yes Historical Provider, MD  fenofibrate 160 MG tablet Take 160 mg by mouth daily.   Yes Historical Provider, MD  loratadine (CLARITIN) 10 MG tablet Take 10 mg by mouth daily  as needed. For allergies   Yes Historical Provider, MD  losartan-hydrochlorothiazide (HYZAAR) 100-25 MG per tablet Take 1 tablet by mouth daily.   Yes Historical Provider, MD  montelukast (SINGULAIR) 10 MG tablet Take 10 mg by mouth at bedtime.   Yes Historical Provider, MD  sertraline (ZOLOFT) 50 MG tablet Take 50 mg by mouth daily.   Yes Historical Provider, MD   Physical Exam: Filed Vitals:   02/05/13 1415  BP: 112/46  Pulse: 87  Temp: 98.4 F (36.9 C)  Resp: 20      General:  Alert pleasant oriented no apparent distress  Eyes: EOMI  ENT: Clear moderate dentition soft supple  Neck: No neck pain no bruit no JVD  Cardiovascular: S1-S2 no murmur rub or gallop  Respiratory: Clinically clear  Abdomen: Soft nontender nondistended  Skin: No lower extremity edema  Musculoskeletal: Range of motion intact  Psychiatric: Pleasant  Neurologic: Within normal limits. Moves all 4 limbs equally without deficit  Labs on Admission:  Basic Metabolic Panel:  Recent Labs Lab 02/05/13 1311  NA 138  K 3.1*  CL 93*  CO2 41*  GLUCOSE 132*  BUN 11  CREATININE 0.52  CALCIUM 9.4   Liver Function Tests:  Recent Labs Lab 02/05/13 1311  AST 20  ALT 16  ALKPHOS 72  BILITOT 0.2*  PROT 8.0  ALBUMIN 3.5   No results found for this basename: LIPASE, AMYLASE,  in the last 168 hours No results found for this basename: AMMONIA,  in the last 168 hours CBC:  Recent Labs Lab 02/05/13 1311  WBC 6.7  NEUTROABS 2.8  HGB 6.5*  HCT 25.6*  MCV 69.6*  PLT 432*   Cardiac Enzymes: No results found for this basename: CKTOTAL, CKMB, CKMBINDEX, TROPONINI,  in the last 168 hours  BNP (last 3 results) No results found for this basename: PROBNP,  in the last 8760 hours CBG: No results found for this basename: GLUCAP,  in the last 168 hours  Radiological Exams on Admission: No results found.  EKG: Independently reviewed. None performed  Assessment/Plan Active Problems:   * No active hospital problems. *   1. Potential diverticular vs. hemorrhoidal bleed-patient will be transfused 2 units which already started-so no use in trying to obtain iron studies. She has microcytic anemia which is likely that this is relatively chronic. Gastroenterology has seen the patient, unclear as to if she will require repeat scope. She is hemodynamically stable and can be admitted to MedSurg with repeat hemoglobin in the morning. 2. Restrictive lung disease with  obstructive component-patient needs to lose truncal obesity, she should followup with her primary pulmonologist as an outpatient further care. She will need also to be on BiPAP at night and she is noncompliant with this per report 3. Morbid obesity BMI 39-patient to increase activity as tolerated as outpatient 4. Chronic respiratory failure-be met in the tip of respiratory acidosis which is chronic. See above  DO NOT RESUSCITATE Inpatient MedSurg  Time spent: 68  Mahala Menghini [facility] Pager [phone]  If 7PM-7AM, please contact night-coverage [email or web address] Password [facility] 02/05/2013, 2:49 PM

## 2013-02-05 NOTE — ED Notes (Signed)
Reported critical hgb 6.5 to PA harris

## 2013-02-05 NOTE — ED Provider Notes (Signed)
CSN: [id number]     Arrival date & time 02/05/13  1217 History   First MD Initiated Contact with Patient 02/05/13 1243     Chief Complaint  Patient presents with  . Abnormal Lab   (Consider location/radiation/quality/duration/timing/severity/associated sxs/prior Treatment) HPI  Shelley [name] is a 47 year old female with a past medical history of asthma, diabetes, hypertension, hyperlipidemia and mental retardation who presents the emergency department with her caretaker for low hemoglobin.  The patient was seen by her physician 2 days ago.  He called her today and said she needed to come to the emergency department for a very low hemoglobin.  I asked the patient where her bleeding may be coming from and she stated that she has a hemorrhoid.  She said that there is frequently blood in her stool and blood when she wipes.  Her caretaker acknowledged this to.  The patient states she does not have periods.  She has a history of diverticulosis.  She has no other complaints.  Her caretaker states that she has had pneumonia last year.  The patient is on 4 L of oxygen via nasal cannula at all times because of obesity hypoventilation syndrome.  Patient acknowledges weakness, increased dyspnea on exertion.  Patient states that she has to stop at the end the hall to take a rest and catch her breath.  This is new.  The patient also acknowledges feeling as if she couldn't state when she stands frequently.  Past Medical History  Diagnosis Date  . Asthma   . Diabetes mellitus   . Hypertension   . Hyperlipidemia   . Pneumonia   . Anxiety   . GERD (gastroesophageal reflux disease)   . Arthritis   . Mental retardation    Past Surgical History  Procedure Laterality Date  . Cholecystectomy      2006   History reviewed. No pertinent family history. History  Substance Use Topics  . Smoking status: Never Smoker   . Smokeless tobacco: Never Used  . Alcohol Use: No   OB History   Grav Para Term Preterm  Abortions TAB SAB Ect Mult Living                 Review of Systems Ten systems reviewed and are negative for acute change, except as noted in the HPI.   Allergies  Fish allergy; Pepto-bismol; and Cortisone  Home Medications   Current Outpatient Rx  Name  Route  Sig  Dispense  Refill  . acetaminophen (TYLENOL) 500 MG tablet   Oral   Take 500 mg by mouth every 6 (six) hours as needed for pain.         Marland Kitchen albuterol (PROVENTIL) (2.5 MG/3ML) 0.083% nebulizer solution   Nebulization   Take 3 mLs (2.5 mg total) by nebulization every 6 (six) hours as needed for wheezing or shortness of breath.   120 vial   5   . budesonide (PULMICORT) 0.25 MG/2ML nebulizer solution   Nebulization   Take 2 mLs (0.25 mg total) by nebulization 2 (two) times daily. DX: 493.90   60 mL   5   . ergocalciferol (VITAMIN D2) 50000 UNITS capsule   Oral   Take 50,000 Units by mouth once a week.         . fenofibrate 160 MG tablet   Oral   Take 160 mg by mouth daily.         Marland Kitchen loratadine (CLARITIN) 10 MG tablet   Oral   Take 10  mg by mouth daily as needed. For allergies         . losartan-hydrochlorothiazide (HYZAAR) 100-25 MG per tablet   Oral   Take 1 tablet by mouth daily.         . montelukast (SINGULAIR) 10 MG tablet   Oral   Take 10 mg by mouth at bedtime.         . sertraline (ZOLOFT) 50 MG tablet   Oral   Take 50 mg by mouth daily.          BP 133/54  Pulse 90  Temp(Src) 99.2 F (37.3 C) (Oral)  Resp 18  Ht 5\' 7"  (1.702 m)  Wt 253 lb (114.76 kg)  BMI 39.62 kg/m2  SpO2 100% Physical Exam  Constitutional: She is oriented to person, place, and time. She appears well-developed and well-nourished. No distress.  HENT:  Head: Normocephalic and atraumatic.  Eyes: Conjunctivae are normal. No scleral icterus.  Pelvic conjunctiva  Neck: Normal range of motion.  Cardiovascular: Normal rate, regular rhythm and normal heart sounds.  Exam reveals no gallop and no friction rub.    No murmur heard. Pulmonary/Chest: Effort normal and breath sounds normal. No respiratory distress.  Abdominal: Soft. Bowel sounds are normal. She exhibits no distension and no mass. There is no tenderness. There is no guarding.  Genitourinary:  Digital Rectal Exam reveals sphincter with good tone. No external hemorrhoids. No masses or fissures. Stool color is brown with no overt blood. Hemoccult is negative  Neurological: She is alert and oriented to person, place, and time.  Skin: Skin is warm and dry. She is not diaphoretic. There is pallor.    ED Course  Procedures (including critical care time) Labs Review Labs Reviewed  CBC WITH DIFFERENTIAL - Abnormal; Notable for the following:    RBC 3.68 (*)    Hemoglobin 6.5 (*)    HCT 25.6 (*)    MCV 69.6 (*)    MCH 17.7 (*)    MCHC 25.4 (*)    RDW 19.0 (*)    Platelets 432 (*)    Neutrophils Relative % 42 (*)    All other components within normal limits  COMPREHENSIVE METABOLIC PANEL - Abnormal; Notable for the following:    Potassium 3.1 (*)    Chloride 93 (*)    CO2 41 (*)    Glucose, Bld 132 (*)    Total Bilirubin 0.2 (*)    All other components within normal limits  OCCULT BLOOD, POC DEVICE  TYPE AND SCREEN  ABO/RH  PREPARE RBC (CROSSMATCH)   Imaging Review No results found.  MDM   1. Anemia requiring transfusions   2. Hypokalemia    Patient with a hemoglobin of 6.5, symptomatic. She has mild hypokalemia.  On physical exam she is extremely pale.  She has some symptomatic anemia.  I've ordered 2 units of A positive blood for transfusion.   I have given report to Dr. Mahala Menghini who will admit the patient for symptomatic anemia requiring transfusion. Bleeding from unknown source.  The patient appears reasonably stabilized for admission considering the current resources, flow, and capabilities available in the ED at this time, and I doubt any other [facility] requiring further screening and/or treatment in the ED prior to  admission.   Arthor Captain, PA-C 02/05/13 1536

## 2013-02-05 NOTE — ED Notes (Addendum)
Pt c/o low hemoglobin.  Reports being seen at dr office and was called today to come here to be admitted due to abnormal hemoglobin of 6.  Paperwork faxed over from drs office.  Pt arrived to ED on home oxygen.

## 2013-02-05 NOTE — Progress Notes (Signed)
Patient does not use a computer at home. Pt does not want to sign up for My Chart. Briscoe Burns BSN, RN-BC Admissions RN  02/05/2013 2:31 PM

## 2013-02-05 NOTE — Progress Notes (Signed)
UR completed 

## 2013-02-05 NOTE — Progress Notes (Signed)
Discussed admission status with Dr. Mahala Menghini at this time. See orders.

## 2013-02-06 ENCOUNTER — Encounter (HOSPITAL_COMMUNITY)
Admission: EM | Disposition: A | Discharge status: Home / Self Care | Payer: Self-pay | Source: Home / Self Care | Attending: Family Medicine

## 2013-02-06 ENCOUNTER — Other Ambulatory Visit: Payer: Self-pay

## 2013-02-06 ENCOUNTER — Encounter (HOSPITAL_COMMUNITY): Payer: Self-pay | Admitting: *Deleted

## 2013-02-06 DIAGNOSIS — E119 Type 2 diabetes mellitus without complications: Secondary | ICD-10-CM | POA: Diagnosis present

## 2013-02-06 DIAGNOSIS — D649 Anemia, unspecified: Secondary | ICD-10-CM

## 2013-02-06 DIAGNOSIS — D500 Iron deficiency anemia secondary to blood loss (chronic): Secondary | ICD-10-CM | POA: Diagnosis present

## 2013-02-06 DIAGNOSIS — E876 Hypokalemia: Secondary | ICD-10-CM | POA: Diagnosis present

## 2013-02-06 HISTORY — PX: ESOPHAGOGASTRODUODENOSCOPY: SHX5428

## 2013-02-06 HISTORY — PX: COLONOSCOPY: SHX5424

## 2013-02-06 LAB — CBC
Hemoglobin: 8.3 g/dL — ABNORMAL LOW (ref 12.0–15.0)
Platelets: 399 10*3/uL (ref 150–400)
RBC: 4.37 MIL/uL (ref 3.87–5.11)
WBC: 6.4 10*3/uL (ref 4.0–10.5)

## 2013-02-06 LAB — TYPE AND SCREEN
ABO/RH(D): A POS
Antibody Screen: NEGATIVE
Unit division: 0

## 2013-02-06 LAB — HEMOGLOBIN A1C
Hgb A1c MFr Bld: 6.1 % — ABNORMAL HIGH (ref <5.7)
Mean Plasma Glucose: 128 mg/dL — ABNORMAL HIGH (ref <117)

## 2013-02-06 SURGERY — COLONOSCOPY
Anesthesia: Moderate Sedation

## 2013-02-06 MED ORDER — FERROUS GLUCONATE 216 MG PO TABS: 216.0000 mg | ORAL_TABLET | Freq: Three times a day (TID) | ORAL | Status: DC

## 2013-02-06 MED ORDER — MIDAZOLAM HCL 5 MG/5ML IJ SOLN
INTRAMUSCULAR | Status: DC | PRN
  Administered 2013-02-06: 1 mg via INTRAVENOUS
  Administered 2013-02-06: 2 mg via INTRAVENOUS
  Administered 2013-02-06 (×3): 1 mg via INTRAVENOUS

## 2013-02-06 MED ORDER — HYDROCORTISONE ACETATE 25 MG RE SUPP: 25.0000 mg | Freq: Two times a day (BID) | RECTAL | Status: DC

## 2013-02-06 MED ORDER — FENTANYL CITRATE 0.05 MG/ML IJ SOLN
INTRAMUSCULAR | Status: DC | PRN
  Administered 2013-02-06 (×2): 25 ug via INTRAVENOUS

## 2013-02-06 MED ORDER — SODIUM CHLORIDE 0.9 % IV SOLN
INTRAVENOUS | Status: DC
  Administered 2013-02-06: 500 mL via INTRAVENOUS

## 2013-02-06 MED ORDER — POTASSIUM CHLORIDE IN NACL 40-0.9 MEQ/L-% IV SOLN
INTRAVENOUS | Status: DC
  Administered 2013-02-06: 14:00:00 via INTRAVENOUS
  Filled 2013-02-06: qty 1000

## 2013-02-06 NOTE — Progress Notes (Signed)
Pt requested RT to set up Auto BIPAP and stated she would call when ready for it.  RT has received no calls in regards to placing pt on Auto-BIPAP for sleep, RT to monitor and assess as needed.

## 2013-02-06 NOTE — Care Management Note (Signed)
   CARE MANAGEMENT NOTE 02/06/2013  Patient:  ATISHA, HAMIDI   Account Number:  [id number]  Date Initiated:  02/06/2013  Documentation initiated by:  Iyania Denne  Subjective/Objective Assessment:   47 yo female admitted with Acute microcytic anemia with rectal bleeding.     Action/Plan:   Home when stable   Anticipated DC Date:     Anticipated DC Plan:  HOME/SELF CARE      DC Planning Services  CM consult      Choice offered to / List presented to:  NA   DME arranged  NA      DME agency  NA     HH arranged  NA      HH agency  NA   Status of service:  Completed, signed off Medicare Important Message given?   (If response is "NO", the following Medicare IM given date fields will be blank) Date Medicare IM given:   Date Additional Medicare IM given:    Discharge Disposition:    Per UR Regulation:  Reviewed for med. necessity/level of care/duration of stay  If discussed at Long Length of Stay Meetings, dates discussed:    Comments:  02/06/13 1504 Shandria Clinch,RN,MSN [phone] Chart reviewed for utilization of services. No barriers to discharge identified.

## 2013-02-06 NOTE — Op Note (Signed)
[facility] [address] [place], [address]   OPERATIVE PROCEDURE REPORT  PATIENT: Shelley, [name]  MR#: [id number] BIRTHDATE: January 14, 1966  GENDER: Female ENDOSCOPIST: Jeani Hawking, MD ASSISTANT:   Kandice Robinsons, technician Dwain Sarna, RN CGRN PROCEDURE DATE: 02/06/2013 PROCEDURE:   Colonoscopy with tissue ablation ASA CLASS:   Class III INDICATIONS:Rectal Bleeding. MEDICATIONS: Versed 5 mg IV and Fentanyl 50 mcg IV  DESCRIPTION OF PROCEDURE:   After the risks benefits and alternatives of the procedure were thoroughly explained, informed consent was obtained.  A digital rectal exam revealed no abnormalities of the rectum.    The Pentax Ped Colon [id number] endoscope was introduced through the anus  and advanced to the cecum, which was identified by both the appendix and ileocecal valve , No adverse events experienced.    The quality of the prep was good. .  The instrument was then slowly withdrawn as the colon was fully examined.     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  It was faint in appearance and there was not stigmata of bleeding.  The AVM was ablated.  During her index colonoscopy two cecal AVMs were identified, but I was not able to identify the second AVM with a detailed examination.  Scattered diverticula were noted mostly in the sigmoid colon.  A few scattered diverticula were noted in the proximal colon..   Retroflexed views revealed internal/external hemorrhoids and they did appear to be irritated.     The scope was then withdrawn from the patient and the procedure terminated.  COMPLICATIONS: There were no complications.  IMPRESSION: 1) Nonbleding cecal AVM s/p APC. 2) Scatttered diverticula. 3) Int/Ext Hemorrhoids.  RECOMMENDATIONS: 1) Follow HGB. 2) Transfuse if necessary. 3) If hematochezia persists with resultant anemia, hemorrhoidal banding may be pursued.  _______________________________ eSignedJeani Hawking,  MD 02/06/2013 12:57 PM

## 2013-02-06 NOTE — Progress Notes (Signed)
Patient discharged home, all discharge medications and instructions reviewed and questions answered. Patient to be assisted to vehicle by wheelchair.  

## 2013-02-06 NOTE — Progress Notes (Signed)
[facility] PROGRESS NOTE  [name] Bodey MRN:[id number] DOB: 06/26/1965 DOA: 02/05/2013 PCP: No primary provider on file.  Brief narrative: Shelley [name] is an 47 y.o. female with a PMH of morbid obesity, chronic respiratory failure secondary to restrictive lung disease/OSA on chronic nocturnal BiPAP and home oxygen therapy, cecal AVMs/diverticulosis by prior colonoscopy, and mild mental retardation was admitted on 02/05/2013 with painless rectal bleeding requiring 2 units of packed red blood cells for symptomatic anemia. Seen by Dr. Hung of gastroenterology on 02/05/2013. Plans for colonoscopy +/- endoscopy for today.  Assessment/Plan: Principal Problem:   Acute microcytic anemia with rectal bleeding -Status post 2 units of packed red blood cells with an appropriate rise in her hemoglobin. -Seen by Dr. Hung of gastroenterology with plans to proceed with colonoscopy/possible EGD today. -Continue to monitor hemoglobin and transfuse as needed. Active Problems:   Obstructive sleep apnea -Continue nocturnal BiPAP as tolerated.   Chronic respiratory failure -Continue supplemental oxygen. Followup pulmonology as an outpatient.   Morbid obesity -Dietitian consultation for weight loss education.   Hypokalemia -Likely from GI losses. Replete in IV fluids.   Hyperglycemia -Given morbid obesity, need to rule out underlying type 2 diabetes. Check hemoglobin A1c.  Code Status: DNR Family Communication: No family at the bedside. Disposition Plan: Home when stable.   Medical Consultants:  Dr. Patrick Hung, Gastroenterology  Other Consultants:  Dietitian  Anti-infectives:  None.  HPI/Subjective: [name] Teague feels well. She is hungry. No nausea or vomiting. No bloody stools and over 24 hours. No complaints of dyspnea or cough.  Objective: Filed Vitals:   02/05/13 1937 02/05/13 2200 02/06/13 0555 02/06/13 0742  BP:  121/75 127/71   Pulse:  80 88   Temp:  98 F (36.7 C) 98.5  F (36.9 C)   TempSrc:  Oral Oral   Resp:  16 20   Height:      Weight:      SpO2: 99% 100% 100% 97%    Intake/Output Summary (Last 24 hours) at 02/06/13 1056 Last data filed at 02/06/13 0828  Gross per 24 hour  Intake   1000 ml  Output    500 ml  Net    500 ml    Exam: Gen:  NAD Cardiovascular:  RRR, No M/R/G Respiratory:  Lungs diminished Gastrointestinal:  Abdomen soft, NT/ND, + BS Extremities:  No C/E/C  Data Reviewed: Basic Metabolic Panel:  Recent Labs Lab 02/05/13 1311  NA 138  K 3.1*  CL 93*  CO2 41*  GLUCOSE 132*  BUN 11  CREATININE 0.52  CALCIUM 9.4   GFR Estimated Creatinine Clearance: 115 ml/min (by C-G formula based on Cr of 0.52). Liver Function Tests:  Recent Labs Lab 02/05/13 1311  AST 20  ALT 16  ALKPHOS 72  BILITOT 0.2*  PROT 8.0  ALBUMIN 3.5   CBC:  Recent Labs Lab 02/05/13 1311 02/06/13 0330  WBC 6.7 6.4  NEUTROABS 2.8  --   HGB 6.5* 8.3*  HCT 25.6* 31.5*  MCV 69.6* 72.1*  PLT 432* 399   Microbiology No results found for this or any previous visit (from the past 240 hour(s)).   Procedures and Diagnostic Studies: No results found.  Scheduled Meds: . budesonide  0.25 mg Nebulization BID  . fenofibrate  160 mg Oral Daily  . montelukast  10 mg Oral QHS  .  morphine injection  4 mg Intravenous Once  . sertraline  50 mg Oral Daily   Continuous Infusions: . sodium chloride   50 mL/hr at 02/05/13 1933    Time spent: 35 minutes with greater than 50% of time spent counseling the patient on her current diagnostic test results, impression, and plan of care.   LOS: 1 day   Alee Gressman  [facility] Pager [phone].   *Please note that the hospitalists switch teams on Wednesdays. Please call the flow manager at [phone] if you are having difficulty reaching the hospitalist taking care of this patient as she can update you and provide the most up-to-date pager number of provider caring for the patient. If 8PM-8AM,  please contact night-coverage at [email or web address], password [email or web address]  02/06/2013, 10:56 AM                

## 2013-02-06 NOTE — ED Provider Notes (Signed)
Medical screening examination/treatment/procedure(s) were conducted as a shared visit with non-physician practitioner(s) and myself.  I personally evaluated the patient during the encounter.  Patient has anemia of uncertain etiology.  Vital signs are normal. Admit for transfusion   Donnetta Hutching, MD 02/06/13 [phone]

## 2013-02-06 NOTE — Interval H&P Note (Signed)
History and Physical Interval Note:  02/06/2013 12:03 PM  Shelley [name]  has presented today for surgery, with the diagnosis of Anemia and hematochezia  The various methods of treatment have been discussed with the patient and family. After consideration of risks, benefits and other options for treatment, the patient has consented to  Procedure(s): COLONOSCOPY (N/A) as a surgical intervention .  The patient's history has been reviewed, patient examined, no change in status, stable for surgery.  I have reviewed the patient's chart and labs.  Questions were answered to the patient's satisfaction.     [name]

## 2013-02-06 NOTE — H&P (View-Only) (Signed)
[facility] PROGRESS NOTE  Shelley [name] XBJ:[id number] DOB: 05-12-1966 DOA: 02/05/2013 PCP: No primary provider on file.  Brief narrative: Shelley [name] is an 47 y.o. female with a PMH of morbid obesity, chronic respiratory failure secondary to restrictive lung disease/OSA on chronic nocturnal BiPAP and home oxygen therapy, cecal AVMs/diverticulosis by prior colonoscopy, and mild mental retardation was admitted on 02/05/2013 with painless rectal bleeding requiring 2 units of packed red blood cells for symptomatic anemia. Seen by Dr. Elnoria Howard of gastroenterology on 02/05/2013. Plans for colonoscopy +/- endoscopy for today.  Assessment/Plan: Principal Problem:   Acute microcytic anemia with rectal bleeding -Status post 2 units of packed red blood cells with an appropriate rise in her hemoglobin. -Seen by Dr. Elnoria Howard of gastroenterology with plans to proceed with colonoscopy/possible EGD today. -Continue to monitor hemoglobin and transfuse as needed. Active Problems:   Obstructive sleep apnea -Continue nocturnal BiPAP as tolerated.   Chronic respiratory failure -Continue supplemental oxygen. Followup pulmonology as an outpatient.   Morbid obesity -Dietitian consultation for weight loss education.   Hypokalemia -Likely from GI losses. Replete in IV fluids.   Hyperglycemia -Given morbid obesity, need to rule out underlying type 2 diabetes. Check hemoglobin A1c.  Code Status: DNR Family Communication: No family at the bedside. Disposition Plan: Home when stable.   Medical Consultants:  Dr. Jeani Hawking, Gastroenterology  Other Consultants:  Dietitian  Anti-infectives:  None.  HPI/Subjective: Shelley [name] feels well. She is hungry. No nausea or vomiting. No bloody stools and over 24 hours. No complaints of dyspnea or cough.  Objective: Filed Vitals:   02/05/13 1937 02/05/13 2200 02/06/13 0555 02/06/13 0742  BP:  121/75 127/71   Pulse:  80 88   Temp:  98 F (36.7 C) 98.5  F (36.9 C)   TempSrc:  Oral Oral   Resp:  16 20   Height:      Weight:      SpO2: 99% 100% 100% 97%    Intake/Output Summary (Last 24 hours) at 02/06/13 1056 Last data filed at 02/06/13 3086  Gross per 24 hour  Intake   1000 ml  Output    500 ml  Net    500 ml    Exam: Gen:  NAD Cardiovascular:  RRR, No M/R/G Respiratory:  Lungs diminished Gastrointestinal:  Abdomen soft, NT/ND, + BS Extremities:  No C/E/C  Data Reviewed: Basic Metabolic Panel:  Recent Labs Lab 02/05/13 1311  NA 138  K 3.1*  CL 93*  CO2 41*  GLUCOSE 132*  BUN 11  CREATININE 0.52  CALCIUM 9.4   GFR Estimated Creatinine Clearance: 115 ml/min (by C-G formula based on Cr of 0.52). Liver Function Tests:  Recent Labs Lab 02/05/13 1311  AST 20  ALT 16  ALKPHOS 72  BILITOT 0.2*  PROT 8.0  ALBUMIN 3.5   CBC:  Recent Labs Lab 02/05/13 1311 02/06/13 0330  WBC 6.7 6.4  NEUTROABS 2.8  --   HGB 6.5* 8.3*  HCT 25.6* 31.5*  MCV 69.6* 72.1*  PLT 432* 399   Microbiology No results found for this or any previous visit (from the past 240 hour(s)).   Procedures and Diagnostic Studies: No results found.  Scheduled Meds: . budesonide  0.25 mg Nebulization BID  . fenofibrate  160 mg Oral Daily  . montelukast  10 mg Oral QHS  .  morphine injection  4 mg Intravenous Once  . sertraline  50 mg Oral Daily   Continuous Infusions: . sodium chloride  50 mL/hr at 02/05/13 1933    Time spent: 35 minutes with greater than 50% of time spent counseling the patient on her current diagnostic test results, impression, and plan of care.   LOS: 1 day   [name]  [facility] Pager [phone].   *Please note that the hospitalists switch teams on Wednesdays. Please call the flow manager at [phone] if you are having difficulty reaching the hospitalist taking care of this patient as she can update you and provide the most up-to-date pager number of provider caring for the patient. If 8PM-8AM,  please contact night-coverage at [email or web address], password [facility]  02/06/2013, 10:56 AM

## 2013-02-06 NOTE — Op Note (Signed)
[facility] [address] [place], [address]   OPERATIVE PROCEDURE REPORT  PATIENT: Shelley [name], Shelley [name]  MR#: [id number] BIRTHDATE: February 05, 1966  GENDER: Female ENDOSCOPIST: Jeani Hawking, MD ASSISTANT:   Kandice Robinsons, technician and Dwain Sarna, RN CGRN PROCEDURE DATE: 02/06/2013 PROCEDURE:   EGD, diagnostic ASA CLASS:   Class III INDICATIONS:Iron deficiency anemia. MEDICATIONS: See the colonoscopy report. TOPICAL ANESTHETIC:   none  DESCRIPTION OF PROCEDURE:   After the risks benefits and alternatives of the procedure were thoroughly explained, informed consent was obtained.  The Pentax Gastroscope [id number]  endoscope was introduced through the mouth  and advanced to the third portion of the duodenum Without limitations.      The instrument was slowly withdrawn as the mucosa was fully examined.      FINDINGS: A 3-4 cm hiatal hernia was noted.  In the gastric antrum a mild erythema was noted, but this is not the source of her anemia. No evidence of any ulcerations, erosions, masses, or vascular abnormalities.          The scope was then withdrawn from the patient and the procedure terminated.  COMPLICATIONS: There were no complications.  IMPRESSION: 1) 3-4 cm Hiatal Hernia. 2) Mild antral erythema.  RECOMMENDATIONS: 1) See the colonoscopy report recommendations.  _______________________________ eSignedJeani Hawking, MD 02/06/2013 1:00 PM

## 2013-02-06 NOTE — Discharge Summary (Addendum)
Physician Discharge Summary  Shelley [name] WGN:[id number] DOB: 10-07-1965 DOA: 02/05/2013  PCP: Maryelizabeth Rowan, MD  Admit date: 02/05/2013 Discharge date: 02/06/2013  Recommendations for Outpatient Follow-up (given to the patient):  1. Recommend repeat CBC in one week to ensure stability of hemoglobin (blood counts). 2. Your colonoscopy showed cecal AVMs, scattered diverticula, and internal/external hemorrhoids all of which could have been the source of rectal bleeding. 3. Consider outpatient referral to general surgery for hemorrhoidal banding for persistent symptoms. Trial of medical therapy with Anusol HC prescribed. 4. Blood glucoses were slightly elevated. Hemoglobin A1c checked prior to discharge, followup with results which are still pending. High blood glucoses may indicate underlying diabetes. 5. We recommend wearing BiPAP at night to reduce your risk of complications from untreated obstructive sleep apnea including pulmonary hypertension and high blood pressure. 6. We recommend weight loss to help reduce your risk of chronic disease complications.  Discharge Diagnoses:  Principal Problem:    Acute microcytic anemia with rectal bleeding status post upper and lower endoscopies with multiple potential etiologies including AVM, diverticulosis and hemorrhoids Active Problems:    Obstructive sleep apnea    Chronic respiratory failure    Morbid obesity    Hypokalemia    Hyperglycemia   Discharge Condition: Improved.  Diet recommendation: Low-sodium, heart healthy.  History of present illness:  Shelley [name] is an 47 y.o. female with a PMH of morbid obesity, chronic respiratory failure secondary to restrictive lung disease/OSA on chronic nocturnal BiPAP and home oxygen therapy, cecal AVMs/diverticulosis by prior colonoscopy, and mild mental retardation was admitted on 02/05/2013 with painless rectal bleeding requiring 2 units of packed red blood cells for symptomatic anemia.  Seen by Dr. Elnoria Howard of gastroenterology on 02/05/2013.  Hospital Course by problem:  Principal Problem:  Acute microcytic anemia with rectal bleeding  -Status post 2 units of packed red blood cells with an appropriate rise in her hemoglobin.  -Seen by Dr. Elnoria Howard of gastroenterology and underwent colonoscopy/EGD 02/06/2013. See details below. -Will discharge home on iron supplementation therapy. Active Problems:  Obstructive sleep apnea  -Continue nocturnal BiPAP as tolerated.  Chronic respiratory failure  -Continue supplemental oxygen. Followup pulmonology as an outpatient.  Morbid obesity  -Dietitian consultation for weight loss education.  Hypokalemia  -Likely from GI losses. Was given a dose of 40 mEq of oral potassium prior to discharge.  Hyperglycemia  -Given morbid obesity, need to rule out underlying type 2 diabetes. Hemoglobin A1c checked prior to discharge, results pending.   Procedures:  EGD 02/06/2013:IMPRESSION:  1) 3-4 cm Hiatal Hernia.  2) Mild antral erythema.   Colonoscopy 02/06/2013: IMPRESSION:  1) Nonbleding cecal AVM s/p APC.  2) Scatttered diverticula. 3) Int/Ext Hemorrhoids.  Consultations:  Dr. Jeani Hawking, Gastroenterology  Discharge Exam: Filed Vitals:   02/06/13 1315  BP: 124/54  Pulse:   Temp:   Resp: 24   Filed Vitals:   02/06/13 1250 02/06/13 1300 02/06/13 1301 02/06/13 1315  BP: 151/82 124/54  124/54  Pulse: 128 91    Temp:   98.2 F (36.8 C)   TempSrc:   Oral   Resp: 20 27  24   Height:      Weight:      SpO2: 98%   87%    Gen:  NAD Cardiovascular:  RRR, No M/R/G Respiratory: Lungs CTAB Gastrointestinal: Abdomen soft, NT/ND with normal active bowel sounds. Extremities: No C/E/C   Discharge Instructions      Discharge Orders   Future Orders Complete By Expires  Call MD for:  extreme fatigue  As directed    Call MD for:  persistant dizziness or light-headedness  As directed    Call MD for:  As directed    Scheduling  Instructions:     Recurrent black or bloody stools.   Diet - low sodium heart healthy  As directed    Discharge instructions  As directed    Comments:     -Recommend repeat CBC in one week to ensure stability of hemoglobin (blood counts). -Your colonoscopy showed cecal AVMs, scattered diverticula, and internal/external hemorrhoids all of which could have been the source of rectal bleeding. -Consider outpatient referral to general surgery for hemorrhoidal banding for persistent symptoms. Trial of medical therapy with Anusol HC prescribed. -Blood glucoses were slightly elevated. Hemoglobin A1c checked prior to discharge, followup with Dr. Zachery Dauer for these results which are still pending.  High blood glucoses may indicate underlying diabetes. -We recommend wearing BiPAP at night to reduce your risk of complications from untreated obstructive sleep apnea including pulmonary hypertension and high blood pressure. -We recommend weight loss to help reduce your risk of chronic disease complications.  You were cared for by Dr. Hillery Aldo  (a hospitalist) during your hospital stay. If you have any questions about your discharge medications or the care you received while you were in the hospital after you are discharged, you can call the unit and ask to speak with the hospitalist on call if the hospitalist that took care of you is not available. Once you are discharged, your primary care physician will handle any further medical issues. Please note that NO REFILLS for any discharge medications will be authorized once you are discharged, as it is imperative that you return to your primary care physician (or establish a relationship with a primary care physician if you do not have one) for your aftercare needs so that they can reassess your need for medications and monitor your lab values.  Any outstanding tests can be reviewed by your PCP at your follow up visit.  It is also important to review any medicine  changes with your PCP.  Please bring these d/c instructions with you to your next visit so your physician can review these changes with you.  If you do not have a primary care physician, you can call [phone] for a physician referral.  It is highly recommended that you obtain a PCP for hospital follow up.   Increase activity slowly  As directed        Medication List         acetaminophen 500 MG tablet  Commonly known as:  TYLENOL  Take 500 mg by mouth every 6 (six) hours as needed for pain.     albuterol (2.5 MG/3ML) 0.083% nebulizer solution  Commonly known as:  PROVENTIL  Take 3 mLs (2.5 mg total) by nebulization every 6 (six) hours as needed for wheezing or shortness of breath.     budesonide 0.25 MG/2ML nebulizer solution  Commonly known as:  PULMICORT  Take 2 mLs (0.25 mg total) by nebulization 2 (two) times daily. DX: 493.90     ergocalciferol 50000 UNITS capsule  Commonly known as:  VITAMIN D2  Take 50,000 Units by mouth once a week.     fenofibrate 160 MG tablet  Take 160 mg by mouth daily.     ferrous gluconate 216 MG tablet  Commonly known as:  FERGON  Take 1 tablet (216 mg total) by mouth 3 (three) times daily  with meals.     hydrocortisone 25 MG suppository  Commonly known as:  ANUSOL-HC  Place 1 suppository (25 mg total) rectally 2 (two) times daily.     loratadine 10 MG tablet  Commonly known as:  CLARITIN  Take 10 mg by mouth daily as needed. For allergies     losartan-hydrochlorothiazide 100-25 MG per tablet  Commonly known as:  HYZAAR  Take 1 tablet by mouth daily.     montelukast 10 MG tablet  Commonly known as:  SINGULAIR  Take 10 mg by mouth at bedtime.     sertraline 50 MG tablet  Commonly known as:  ZOLOFT  Take 50 mg by mouth daily.          The results of significant diagnostics from this hospitalization (including imaging, microbiology, ancillary and laboratory) are listed below for reference.    Significant Diagnostic Studies: No  results found.  Labs:  Basic Metabolic Panel:  Recent Labs Lab 02/05/13 1311  NA 138  K 3.1*  CL 93*  CO2 41*  GLUCOSE 132*  BUN 11  CREATININE 0.52  CALCIUM 9.4   GFR Estimated Creatinine Clearance: 115 ml/min (by C-G formula based on Cr of 0.52). Liver Function Tests:  Recent Labs Lab 02/05/13 1311  AST 20  ALT 16  ALKPHOS 72  BILITOT 0.2*  PROT 8.0  ALBUMIN 3.5   CBC:  Recent Labs Lab 02/05/13 1311 02/06/13 0330  WBC 6.7 6.4  NEUTROABS 2.8  --   HGB 6.5* 8.3*  HCT 25.6* 31.5*  MCV 69.6* 72.1*  PLT 432* 399    Time coordinating discharge: 35 minutes.  Signed:  RAMA,CHRISTINA  Pager [phone] [facility] 02/06/2013, 2:34 PM

## 2013-02-07 ENCOUNTER — Encounter (HOSPITAL_COMMUNITY): Payer: Self-pay | Admitting: Gastroenterology

## 2013-02-20 ENCOUNTER — Ambulatory Visit
Admission: RE | Admit: 2013-02-20 | Discharge: 2013-02-20 | Disposition: A | Discharge status: Home / Self Care | Payer: Medicare Other | Source: Ambulatory Visit | Attending: Family Medicine | Admitting: Family Medicine

## 2013-02-20 DIAGNOSIS — R109 Unspecified abdominal pain: Secondary | ICD-10-CM

## 2013-02-26 ENCOUNTER — Ambulatory Visit (INDEPENDENT_AMBULATORY_CARE_PROVIDER_SITE_OTHER): Payer: No Typology Code available for payment source | Admitting: Surgery

## 2013-03-06 ENCOUNTER — Encounter (INDEPENDENT_AMBULATORY_CARE_PROVIDER_SITE_OTHER): Payer: Self-pay | Admitting: Surgery

## 2013-03-06 ENCOUNTER — Ambulatory Visit (INDEPENDENT_AMBULATORY_CARE_PROVIDER_SITE_OTHER): Payer: No Typology Code available for payment source | Admitting: Surgery

## 2013-03-06 VITALS — BP 126/72 | HR 98 | Temp 98.2°F | Resp 18 | Ht 67.0 in | Wt 264.6 lb

## 2013-03-06 DIAGNOSIS — Z9981 Dependence on supplemental oxygen: Secondary | ICD-10-CM

## 2013-03-06 DIAGNOSIS — F79 Unspecified intellectual disabilities: Secondary | ICD-10-CM

## 2013-03-06 DIAGNOSIS — F411 Generalized anxiety disorder: Secondary | ICD-10-CM

## 2013-03-06 DIAGNOSIS — K648 Other hemorrhoids: Secondary | ICD-10-CM

## 2013-03-06 DIAGNOSIS — D509 Iron deficiency anemia, unspecified: Secondary | ICD-10-CM

## 2013-03-06 DIAGNOSIS — F419 Anxiety disorder, unspecified: Secondary | ICD-10-CM

## 2013-03-06 NOTE — Patient Instructions (Addendum)
ANORECTAL SURGERY: POST OP INSTRUCTIONS  1. Take your usually prescribed home medications unless otherwise directed. 2. DIET: Follow a light bland diet the first 24 hours after arrival home, such as soup, liquids, crackers, etc.  Be sure to include lots of fluids daily.  Avoid fast food or heavy meals as your are more likely to get nauseated.  Eat a low fat the next few days after surgery.   3. PAIN CONTROL: a. Pain is best controlled by a usual combination of three different methods TOGETHER: i. Ice/Heat ii. Over the counter pain medication iii. Prescription pain medication b. Most patients will experience some swelling and discomfort in the anus/rectal area. and incisions.  Ice packs or heat (30-60 minutes up to 6 times a day) will help. Use ice for the first few days to help decrease swelling and bruising, then switch to heat such as warm towels, sitz baths, warm baths, etc to help relax tight/sore spots and speed recovery.  Some people prefer to use ice alone, heat alone, alternating between ice & heat.  Experiment to what works for you.  Swelling and bruising can take several weeks to resolve.   c. It is helpful to take an over-the-counter pain medication regularly for the first few weeks.  Choose one of the following that works best for you: i. Naproxen (Aleve, etc)  Two 220mg tabs twice a day ii. Ibuprofen (Advil, etc) Three 200mg tabs four times a day (every meal & bedtime) iii. Acetaminophen (Tylenol, etc) 500-650mg four times a day (every meal & bedtime) d. A  prescription for pain medication (such as oxycodone, hydrocodone, etc) should be given to you upon discharge.  Take your pain medication as prescribed.  i. If you are having problems/concerns with the prescription medicine (does not control pain, nausea, vomiting, rash, itching, etc), please call us [phone] to see if we need to switch you to a different pain medicine that will work better for you and/or control your side effect  better. ii. If you need a refill on your pain medication, please contact your pharmacy.  They will contact our office to request authorization. Prescriptions will not be filled after 5 pm or on week-ends. 4. KEEP YOUR BOWELS REGULAR a. The goal is one bowel movement a day b. Avoid getting constipated.  Between the surgery and the pain medications, it is common to experience some constipation.  Increasing fluid intake and taking a fiber supplement (such as Metamucil, Citrucel, FiberCon, MiraLax, etc) 1-2 times a day regularly will usually help prevent this problem from occurring.  A mild laxative (prune juice, Milk of Magnesia, MiraLax, etc) should be taken according to package directions if there are no bowel movements after 48 hours. c. Watch out for diarrhea.  If you have many loose bowel movements, simplify your diet to bland foods & liquids for a few days.  Stop any stool softeners and decrease your fiber supplement.  Switching to mild anti-diarrheal medications (Kayopectate, Pepto Bismol) can help.  If this worsens or does not improve, please call us.  5. Wound Care a. Remove your bandages the day after surgery.  Unless discharge instructions indicate otherwise, leave your bandage dry and in place overnight.  Remove the bandage during your first bowel movement.   b. Allow the wound packing to fall out over the next few days.  You can trim exposed gauze / ribbon as it falls out.  You do not need to repack the wound unless instructed otherwise.  Wear an   absorbent pad or soft cotton gauze in your underwear as needed to catch any drainage and help keep the area  c. Keep the area clean and dry.  Bathe / shower every day.  Keep the area clean by showering / bathing over the incision / wound.   It is okay to soak an open wound to help wash it.  Wet wipes or showers / gentle washing after bowel movements is often less traumatic than regular toilet paper. d. Bonita Quin may have some styrofoam-like soft packing in  the rectum which will come out with the first bowel movement.  e. You will often notice bleeding with bowel movements.  This should slow down by the end of the first week of surgery f. Expect some drainage.  This should slow down, too, by the end of the first week of surgery.  Wear an absorbent pad or soft cotton gauze in your underwear until the drainage stops. 6. ACTIVITIES as tolerated:   a. You may resume regular (light) daily activities beginning the next day-such as daily self-care, walking, climbing stairs-gradually increasing activities as tolerated.  If you can walk 30 minutes without difficulty, it is safe to try more intense activity such as jogging, treadmill, bicycling, low-impact aerobics, swimming, etc. b. Save the most intensive and strenuous activity for last such as sit-ups, heavy lifting, contact sports, etc  Refrain from any heavy lifting or straining until you are off narcotics for pain control.   c. DO NOT PUSH THROUGH PAIN.  Let pain be your guide: If it hurts to do something, don't do it.  Pain is your body warning you to avoid that activity for another week until the pain goes down. d. You may drive when you are no longer taking prescription pain medication, you can comfortably sit for long periods of time, and you can safely maneuver your car and apply brakes. e. Bonita Quin may have sexual intercourse when it is comfortable.  7. FOLLOW UP in our office a. Please call [facility] at [phone] to set up an appointment to see your surgeon in the office for a follow-up appointment approximately 2 weeks after your surgery. b. Make sure that you call for this appointment the day you arrive home to insure a convenient appointment time. 10. IF YOU HAVE DISABILITY OR FAMILY LEAVE FORMS, BRING THEM TO THE OFFICE FOR PROCESSING.  DO NOT GIVE THEM TO YOUR DOCTOR.        WHEN TO CALL us [phone]: 1. Poor pain control 2. Reactions / problems with new medications (rash/itching, nausea,  etc)  3. Fever over 101.5 F (38.5 C) 4. Inability to urinate 5. Nausea and/or vomiting 6. Worsening swelling or bruising 7. Continued bleeding from incision. 8. Increased pain, redness, or drainage from the incision  The clinic staff is available to answer your questions during regular business hours (8:30am-5pm).  Please don't hesitate to call and ask to speak to one of our nurses for clinical concerns.   A surgeon from [facility], [facility] is always on call at the hospitals   If you have a medical emergency, go to the nearest emergency room or call 911.    [facility] [address], [address], [place], [place]  [address] ? MAIN: [phone] ? TOLL FREE: [phone] ? FAX [phone] [email or web address]   HEMORRHOIDS  The rectum is the last foot of your colon, and it naturally stretches to hold stool.  Hemorrhoidal piles are natural clusters of blood vessels  that help the rectum and anal canal stretch to hold stool and allow bowel movements to eliminate feces.   Hemorrhoids are abnormally swollen blood vessels in the rectum.  Too much pressure in the rectum causes hemorrhoids by forcing blood to stretch and bulge the walls of the veins, sometimes even rupturing them.  Hemorrhoids can become like varicose veins you might see on a person's legs.  Most people will develop a flare of hemorrhoids in their lifetime.  When bulging hemorrhoidal veins are irritated, they can swell, burn, itch, cause pain, and bleed.  Most flares will calm down gradually own within a few weeks.  However, once hemorrhoids are created, they are difficult to get rid of completely and tend to flare more easily than the first flare.   Fortunately, good habits and simple medical treatment usually control hemorrhoids well, and surgery is needed only in severe cases. Types of Hemorrhoids:  Internal hemorrhoids usually don't initially hurt or itch; they are deep inside the rectum  and usually have no sensation. If they begin to push out (prolapse), pain and burning can occur.  However, internal hemorrhoids can bleed.  Anal bleeding should not be ignored since bleeding could come from a dangerous source like colorectal cancer, so persistent rectal bleeding should be investigated by a doctor, sometimes with a colonoscopy.  External hemorrhoids cause most of the symptoms - pain, burning, and itching. Nonirritated hemorrhoids can look like small skin tags coming out of the anus.   Thrombosed hemorrhoids can form when a hemorrhoid blood vessel bursts and causes the hemorrhoid to suddenly swell.  A purple blood clot can form in it and become an excruciatingly painful lump at the anus. Because of these unpleasant symptoms, immediate incision and drainage by a surgeon at an office visit can provide much relief of the pain.    PREVENTION Avoiding the most frequent causes listed below will prevent most cases of hemorrhoids: Constipation Hard stools Diarrhea  Constant sitting  Straining with bowel movements Sitting on the toilet for a long time  Severe coughing  episodes Pregnancy / Childbirth  Heavy Lifting  Sometimes avoiding the above triggers is difficult:  How can you avoid sitting all day if you have a seated job? Also, we try to avoid coughing and diarrhea, but sometimes it's beyond your control.  Still, there are some practical hints to help: Keep the anal and genital area clean.  Moistened tissues such as flushable wet wipes are less irritating than toilet paper.  Using irrigating showers or bottle irrigation washing gently cleans this sensitive area.   Avoid dry toilet paper when cleaning after bowel movements.  Marland Kitchen Keep the anal and genital area dry.  Lightly pat the rectal area dry.  Avoid rubbing.  Talcum or baby powders can help GET YOUR STOOLS SOFT.   This is the most important way to prevent irritated hemorrhoids.  Hard stools are like sandpaper to the anorectal canal and  will cause more problems.  The goal: ONE SOFT BOWEL MOVEMENT A DAY!  BMs from every other day to 3 times a day is a tolerable range Treat coughing, diarrhea and constipation early since irritated hemorrhoids may soon follow.  If your main job activity is seated, always stand or walk during your breaks. Make it a point to stand and walk at least 5 minutes every hour and try to shift frequently in your chair to avoid direct rectal pressure.  Always exhale as you strain or lift. Don't hold your breath.  Do not delay or try to prevent a bowel movement when the urge is present. Exercise regularly (walking or jogging 60 minutes a day) to stimulate the bowels to move. No reading or other activity while on the toilet. If bowel movements take longer than 5 minutes, you are too constipated. AVOID CONSTIPATION Drink plenty of liquids (1 1/2 to 2 quarts of water and other fluids a day unless fluid restricted for another medical condition). Liquids that contain caffeine (coffee a, tea, soft drinks) can be dehydrating and should be avoided until constipation is controlled. Consider minimizing milk, as dairy products may be constipating. Eat plenty of fiber (30g a day ideal, more if needed).  Fiber is the undigested part of plant food that passes into the colon, acting as "natures broom" to encourage bowel motility and movement.  Fiber can absorb and hold large amounts of water. This results in a larger, bulkier stool, which is soft and easier to pass.  Eating foods high in fiber - 12 servings - such as  Vegetables: Root (potatoes, carrots, turnips), Leafy green (lettuce, salad greens, celery, spinach), High residue (cabbage, broccoli, etc.) Fruit: Fresh, Dried (prunes, apricots, cherries), Stewed (applesauce)  Whole grain breads, pasta, whole wheat Bran cereals, muffins, etc. Consider adding supplemental bulking fiber which retains large volumes of water: Psyllium ground seeds (native plant from [place])--available as Metamucil, Konsyl, Effersyllium, Per Diem Fiber, or the less expensive generic forms.  Citrucel  (methylcellulose wood fiber) . FiberCon (Polycarbophil) Polyethylene Glycol - and "artificial" fiber commonly called Miralax or Glycolax.  It is helpful for people with gassy or bloated feelings with regular fiber Flax Seed - a less gassy natural fiber  Laxatives can be useful for a short period if constipation is severe Osmotics (Milk of Magnesia, Fleets Phospho-Soda, Magnesium Citrate)  Stimulants (Senokot,   Castor Oil,  Dulcolax, Ex-Lax)    Laxatives are not a good long-term solution as it can stress the bowels and cause too much mineral loss and dehydration.   Avoid taking laxatives for more than 7 days in a row.  AVOID DIARRHEA Switch to liquids and simpler foods for a few days to avoid stressing your intestines further. Avoid dairy products (especially milk & ice cream) for a short time.  The intestines often can lose the ability to digest lactose when stressed. Avoid foods that cause gassiness or bloating.  Typical foods include beans and other legumes, cabbage, broccoli, and dairy foods.  Every person has some sensitivity to other foods, so listen to your body and avoid those foods that trigger problems for you. Adding fiber (Citrucel, Metamucil, FiberCon, Flax seed, Miralax) gradually can help thicken stools by absorbing excess fluid and retrain the intestines to act more normally.  Slowly increase the dose over a few weeks.  Too much fiber too soon can backfire and cause cramping & bloating. Probiotics (such as active yogurt, Align, etc) may help repopulate the intestines and colon with normal bacteria and calm down a sensitive digestive tract.  Most studies show it to be of mild help, though, and such products can be costly. Medicines: Bismuth subsalicylate (ex. Kayopectate, Pepto Bismol) every 30 minutes for up to 6 doses can help control diarrhea.  Avoid if  pregnant. Loperamide (Immodium) can slow down diarrhea.  Start with two tablets (4mg  total) first and then try one tablet every 6 hours.  Avoid if you are having fevers or severe pain.  If you are not better or start feeling worse, stop all medicines  and call your doctor for advice Call your doctor if you are getting worse or not better.  Sometimes further testing (cultures, endoscopy, X-ray studies, bloodwork, etc) may be needed to help diagnose and treat the cause of the diarrhea. TREATMENT OF HEMORRHOID FLARE If these preventive measures fail, you must take action right away! Hemorrhoids are one condition that can be mild in the morning and become intolerable by nightfall. Most hemorrhoidal flares take several weeks to calm down.  These suggestions can help: Warm soaks.  This helps more than any topical medication.  Use up to 8 times a day.  Usually sitz baths or sitting in a warm bathtub helps.  Sitting on moist warm towels are helpful.  Switching to ice packs/cool compresses can be helpful Normalize your bowels.  Extremes of diarrhea or constipation will make hemorrhoids worse.  One soft bowel movement a day is the goal.  Fiber can help get your bowels regular Wet wipes instead of toilet paper Pain control with a NSAID such as ibuprofen (Advil) or naproxen (Aleve) or acetaminophen (Tylenol) around the clock.  Narcotics are constipating and should be minimized if possible Topical creams contain steroids (bydrocortisone) or local anesthetic (xylocaine) can help make pain and itching more tolerable.   EVALUATION If hemorrhoids are still causing problems, you could benefit by an evaluation by a surgeon.  The surgeon will obtain a history and examine you.  If hemorrhoids are diagnosed, some therapies can be offered in the office, usually with an anoscope into the less sensitive area of the rectum: -injection of hemorrhoids (sclerotherapy) can scar the blood vessels of the swollen/enlarged hemorrhoids to  help shrink them down to a more normal size -rubber banding of the enlarged hemorrhoids to help shrink them down to a more normal size -drainage of the blood clot causing a thrombosed hemorrhoid,  to relieve the severe pain   While 90% of the time such problems from hemorrhoids can be managed without preceding to surgery, sometimes the hemorrhoids require a operation to control the problem (uncontrolled bleeding, prolapse, pain, etc.).   This involves being placed under general anesthesia where the surgeon can confirm the diagnosis and remove, suture, or staple the hemorrhoid(s).  Your surgeon can help you treat the problem appropriately.    GETTING TO GOOD BOWEL HEALTH. Irregular bowel habits such as constipation and diarrhea can lead to many problems over time.  Having one soft bowel movement a day is the most important way to prevent further problems.  The anorectal canal is designed to handle stretching and feces to safely manage our ability to get rid of solid waste (feces, poop, stool) out of our body.  BUT, hard constipated stools can act like ripping concrete bricks and diarrhea can be a burning fire to this very sensitive area of our body, causing inflamed hemorrhoids, anal fissures, increasing risk is perirectal abscesses, abdominal pain/bloating, an making irritable bowel worse.     The goal: ONE SOFT BOWEL MOVEMENT A DAY!  To have soft, regular bowel movements:    Drink at least 8 tall glasses of water a day.     Take plenty of fiber.  Fiber is the undigested part of plant food that passes into the colon, acting s "natures broom" to encourage bowel motility and movement.  Fiber can absorb and hold large amounts of water. This results in a larger, bulkier stool, which is soft and easier to pass. Work gradually over several weeks up to 6 servings a day of fiber (  25g a day even more if needed) in the form of: o Vegetables -- Root (potatoes, carrots, turnips), leafy green (lettuce, salad greens,  celery, spinach), or cooked high residue (cabbage, broccoli, etc) o Fruit -- Fresh (unpeeled skin & pulp), Dried (prunes, apricots, cherries, etc ),  or stewed ( applesauce)  o Whole grain breads, pasta, etc (whole wheat)  o Bran cereals    Bulking Agents -- This type of water-retaining fiber generally is easily obtained each day by one of the following:  o Psyllium bran -- The psyllium plant is remarkable because its ground seeds can retain so much water. This product is available as Metamucil, Konsyl, Effersyllium, Per Diem Fiber, or the less expensive generic preparation in drug and health food stores. Although labeled a laxative, it really is not a laxative.  o Methylcellulose -- This is another fiber derived from wood which also retains water. It is available as Citrucel. o Polyethylene Glycol - and "artificial" fiber commonly called Miralax or Glycolax.  It is helpful for people with gassy or bloated feelings with regular fiber o Flax Seed - a less gassy fiber than psyllium   No reading or other relaxing activity while on the toilet. If bowel movements take longer than 5 minutes, you are too constipated   AVOID CONSTIPATION.  High fiber and water intake usually takes care of this.  Sometimes a laxative is needed to stimulate more frequent bowel movements, but    Laxatives are not a good long-term solution as it can wear the colon out. o Osmotics (Milk of Magnesia, Fleets phosphosoda, Magnesium citrate, MiraLax, GoLytely) are safer than  o Stimulants (Senokot, Castor Oil, Dulcolax, Ex Lax)    o Do not take laxatives for more than 7days in a row.    IF SEVERELY CONSTIPATED, try a Bowel Retraining Program: o Do not use laxatives.  o Eat a diet high in roughage, such as bran cereals and leafy vegetables.  o Drink six (6) ounces of prune or apricot juice each morning.  o Eat two (2) large servings of stewed fruit each day.  o Take one (1) heaping tablespoon of a psyllium-based bulking agent  twice a day. Use sugar-free sweetener when possible to avoid excessive calories.  o Eat a normal breakfast.  o Set aside 15 minutes after breakfast to sit on the toilet, but do not strain to have a bowel movement.  o If you do not have a bowel movement by the third day, use an enema and repeat the above steps.    Controlling diarrhea o Switch to liquids and simpler foods for a few days to avoid stressing your intestines further. o Avoid dairy products (especially milk & ice cream) for a short time.  The intestines often can lose the ability to digest lactose when stressed. o Avoid foods that cause gassiness or bloating.  Typical foods include beans and other legumes, cabbage, broccoli, and dairy foods.  Every person has some sensitivity to other foods, so listen to our body and avoid those foods that trigger problems for you. o Adding fiber (Citrucel, Metamucil, psyllium, Miralax) gradually can help thicken stools by absorbing excess fluid and retrain the intestines to act more normally.  Slowly increase the dose over a few weeks.  Too much fiber too soon can backfire and cause cramping & bloating. o Probiotics (such as active yogurt, Align, etc) may help repopulate the intestines and colon with normal bacteria and calm down a sensitive digestive tract.  Most studies show  it to be of mild help, though, and such products can be costly. o Medicines:   Bismuth subsalicylate (ex. Kayopectate, Pepto Bismol) every 30 minutes for up to 6 doses can help control diarrhea.  Avoid if pregnant.   Loperamide (Immodium) can slow down diarrhea.  Start with two tablets (4mg  total) first and then try one tablet every 6 hours.  Avoid if you are having fevers or severe pain.  If you are not better or start feeling worse, stop all medicines and call your doctor for advice o Call your doctor if you are getting worse or not better.  Sometimes further testing (cultures, endoscopy, X-ray studies, bloodwork, etc) may be needed  to help diagnose and treat the cause of the diarrhea.  Anxiety and Panic Attacks Your caregiver has informed you that you are having an anxiety or panic attack. There may be many forms of this. Most of the time these attacks come suddenly and without warning. They come at any time of day, including periods of sleep, and at any time of life. They may be strong and unexplained. Although panic attacks are very scary, they are physically harmless. Sometimes the cause of your anxiety is not known. Anxiety is a protective mechanism of the body in its fight or flight mechanism. Most of these perceived danger situations are actually nonphysical situations (such as anxiety over losing a job). CAUSES  The causes of an anxiety or panic attack are many. Panic attacks may occur in otherwise healthy people given a certain set of circumstances. There may be a genetic cause for panic attacks. Some medications may also have anxiety as a side effect. SYMPTOMS  Some of the most common feelings are:  Intense terror.  Dizziness, feeling faint.  Hot and cold flashes.  Fear of going crazy.  Feelings that nothing is real.  Sweating.  Shaking.  Chest pain or a fast heartbeat (palpitations).  Smothering, choking sensations.  Feelings of impending doom and that death is near.  Tingling of extremities, this may be from over-breathing.  Altered reality (derealization).  Being detached from yourself (depersonalization). Several symptoms can be present to make up anxiety or panic attacks. DIAGNOSIS  The evaluation by your caregiver will depend on the type of symptoms you are experiencing. The diagnosis of anxiety or panic attack is made when no physical illness can be determined to be a cause of the symptoms. TREATMENT  Treatment to prevent anxiety and panic attacks may include:  Avoidance of circumstances that cause anxiety.  Reassurance and relaxation.  Regular exercise.  Relaxation therapies, such  as yoga.  Psychotherapy with a psychiatrist or therapist.  Avoidance of caffeine, alcohol and illegal drugs.  Prescribed medication. SEEK IMMEDIATE MEDICAL CARE IF:   You experience panic attack symptoms that are different than your usual symptoms.  You have any worsening or concerning symptoms. Document Released: 05/30/2005 Document Revised: 08/22/2011 Document Reviewed: 10/01/2009 [facility] Patient Information 2014 [place], [place].

## 2013-03-06 NOTE — Progress Notes (Signed)
Subjective:     Patient ID: Shelley [name], female   DOB: Apr 08, 1966, 47 y.o.   MRN: [id number]  HPI  Shelley [name]  [birth year] [id number]  Patient Care Team: Willow Ora, MD as PCP - General (Family Medicine) Maryelizabeth Rowan, MD as Attending Physician (Emergency Medicine) Theda Belfast, MD as Consulting Physician (Gastroenterology) Lucy Antigua, MD as Consulting Physician (Internal Medicine) [name], MD as Consulting Physician (General Surgery) Graylin Shiver, MD as Consulting Physician (Gastroenterology) [name], MD as Consulting Physician (Pulmonary Disease)  This patient is a 47 y.o.female who presents today for surgical evaluation at the request of Dr. Duanne [name].   Reason for visit: Rectal bleeding.  Presumed internal hemorrhoid etiology  Suite but very anxious female with developmental delay.  She was afraid to even step on to the foot still to get out of the bed.  I freed a fall.  He took three of [place], but eventually she was able to do that.  She is struggled with intermittent rectal bleeding.  Actually had severe anemia that required admission.  Underwent colonoscopy and upper endoscopy by Dr. Elnoria [name].  That seemed underwhelming.  There was concern that perhaps the internal hemorrhoids were the etiology.  Therefore, surgical consultation requested.  Patient claims to have daily bowel movements.  She comes today with a caregiver.  The caregiver implies she tends to have loose diarrhea.  She believes she had a hemorrhoid banding done about 10 years ago.  No other surgeries.  No personal nor family history of GI/colon cancer, inflammatory bowel disease, irritable bowel syndrome, allergy such as Celiac Sprue, dietary/dairy problems, colitis, ulcers nor gastritis.  No recent sick contacts/gastroenteritis.  No travel outside the country.  No changes in diet.    Patient Active Problem List   Diagnosis Date Noted  . Obesity, Class III, BMI 40-49.9 (morbid obesity) 03/06/2013  .  Hemorrhoids, internal, with bleeding 03/06/2013  . Dependence on supplemental oxygen 03/06/2013  . Anxiety   . Mental retardation   . Acute microcytic anemia with rectal bleeding 02/06/2013  . Hypokalemia 02/06/2013  . Hyperglycemia 02/06/2013  . Physical deconditioning 11/08/2012  . Developmental delay 12/02/2011  . Obstructive sleep apnea 12/02/2011  . Chronic respiratory failure 12/02/2011  . ALLERGIC RHINITIS 08/02/2007  . Mild persistent asthma 08/02/2007    Past Medical History  Diagnosis Date  . Asthma   . Diabetes mellitus   . Hypertension   . Hyperlipidemia   . Pneumonia   . Anxiety   . GERD (gastroesophageal reflux disease)   . Arthritis   . Mental retardation   . Hiatal hernia   . AVM (arteriovenous malformation) of colon   . Hemorrhoids     Internal and external  . Diverticulosis     Past Surgical History  Procedure Laterality Date  . Cholecystectomy      2006  . Eye surgery    . Colonoscopy N/A 02/06/2013    Procedure: COLONOSCOPY;  Surgeon: Theda Belfast, MD;  Location: [facility];  Service: Endoscopy;  Laterality: N/A;  . Esophagogastroduodenoscopy N/A 02/06/2013    Procedure: ESOPHAGOGASTRODUODENOSCOPY (EGD);  Surgeon: Theda Belfast, MD;  Location: [facility];  Service: Endoscopy;  Laterality: N/A;    History   Social History  . Marital Status: Single    Spouse Name: N/A    Number of Children: N/A  . Years of Education: N/A   Occupational History  . Not on file.   Social History Main Topics  .  Smoking status: Never Smoker   . Smokeless tobacco: Never Used  . Alcohol Use: No  . Drug Use: No  . Sexual Activity: Not on file   Other Topics Concern  . Not on file   Social History Narrative   [name] along with her "aunt" Shelley [name] (actually just a friend) who was originally her HCPOA. "Cousin"/friend Shelley [name] has now taken over HCPOA  as aunt with Dementia.       Patient is DNR/DNI    PCP  Dr. Duanne [name] on [address] in [place].                 Family History  Problem Relation Age of Onset  . COPD Mother     Current Outpatient Prescriptions  Medication Sig Dispense Refill  . acetaminophen (TYLENOL) 500 MG tablet Take 500 mg by mouth every 6 (six) hours as needed for pain.      Marland Kitchen albuterol (PROVENTIL) (2.5 MG/3ML) 0.083% nebulizer solution Take 3 mLs (2.5 mg total) by nebulization every 6 (six) hours as needed for wheezing or shortness of breath.  120 vial  5  . ergocalciferol (VITAMIN D2) 50000 UNITS capsule Take 50,000 Units by mouth once a week.      . fenofibrate 160 MG tablet Take 160 mg by mouth daily.      . ferrous gluconate (FERGON) 216 MG tablet Take 1 tablet (216 mg total) by mouth 3 (three) times daily with meals.  90 tablet  2  . hydrocortisone (ANUSOL-HC) 25 MG suppository Place 1 suppository (25 mg total) rectally 2 (two) times daily.  12 suppository  3  . loratadine (CLARITIN) 10 MG tablet Take 10 mg by mouth daily as needed. For allergies      . losartan-hydrochlorothiazide (HYZAAR) 100-25 MG per tablet Take 1 tablet by mouth daily.      . montelukast (SINGULAIR) 10 MG tablet Take 10 mg by mouth at bedtime.      . sertraline (ZOLOFT) 50 MG tablet Take 50 mg by mouth daily.      . budesonide (PULMICORT) 0.25 MG/2ML nebulizer solution Take 2 mLs (0.25 mg total) by nebulization 2 (two) times daily. DX: 493.90  60 mL  5   No current facility-administered medications for this visit.     Allergies  Allergen Reactions  . Fish Allergy Swelling  . Pepto-Bismol [Bismuth Subsalicylate] Nausea And Vomiting  . Cortisone Itching and Rash    Injections and cream only.  PO ok.    BP 126/72  Pulse 98  Temp(Src) 98.2 F (36.8 C)  Resp 18  Ht 5\' 7"  (1.702 m)  Wt 264 lb 9.6 oz (120.022 kg)  BMI 41.43 kg/m2  US Abdomen Complete  02/20/2013   *RADIOLOGY REPORT*  Clinical Data:  Abdominal pain  COMPLETE ABDOMINAL ULTRASOUND  Comparison:  CT, 08/11/2008.   Abdominal ultrasound, 07/25/2008  Findings:  Gallbladder:  Surgically removed  Common bile duct:  Mildly dilated measuring 7.8 mm.  No duct stone is seen.  Liver:  Liver is echogenic and borderline enlarged.  Small cyst in the right lobe is stable measuring 19 mm.  No other liver masses or lesions.  IVC:  Appears normal.  Pancreas:  The superior head, neck body and proximal tail were visualized and are within normal limits.  Other portions were obscured by bowel gas.  Spleen:  Mildly enlarged measuring 12.6 cm with a volume of 699 ml. No splenic mass or focal lesion.  Right  Kidney:  Normal measuring 10.7 cm.  Left Kidney:  Normal measuring 12/01 8 cm.  Abdominal aorta:  No aneurysm identified.  IMPRESSION: No acute findings.  Borderline enlarged liver that is diffusely echogenic, likely hepatic steatosis.  This has increased since the prior ultrasound.  Mild splenomegaly, also increased from the prior.  Status post cholecystectomy.  Mild, chronic dilation of the common bile duct similar to the prior ultrasound   Original Report Authenticated By: Amie Portland, M.D.     Review of Systems  Constitutional: Positive for fatigue. Negative for fever, chills, diaphoresis and appetite change.  HENT: Negative for ear pain, sore throat, trouble swallowing, neck pain and ear discharge.   Eyes: Negative for photophobia, discharge and visual disturbance.  Respiratory: Positive for shortness of breath. Negative for cough, choking and chest tightness.   Cardiovascular: Positive for leg swelling. Negative for chest pain and palpitations.  Gastrointestinal: Positive for diarrhea, constipation and anal bleeding. Negative for nausea, vomiting, abdominal pain and rectal pain.  Endocrine: Negative for cold intolerance and heat intolerance.  Genitourinary: Negative for dysuria, frequency and difficulty urinating.  Musculoskeletal: Negative for myalgias and gait problem.  Skin: Negative for color change, pallor and rash.   Allergic/Immunologic: Negative for environmental allergies, food allergies and immunocompromised state.  Neurological: Positive for weakness and headaches. Negative for dizziness, speech difficulty and numbness.  Hematological: Negative for adenopathy. Does not bruise/bleed easily.  Psychiatric/Behavioral: Positive for behavioral problems and decreased concentration. Negative for confusion and agitation. The patient is nervous/anxious.        Objective:   Physical Exam  Constitutional: She is oriented to person, place, and time. She appears well-developed and well-nourished. She appears listless.  Non-toxic appearance. She does not have a sickly appearance. She does not appear ill. She appears distressed. Nasal cannula in place.  Morbidly obese.  Apple body habitus.  HENT:  Head: Normocephalic.  Mouth/Throat: Oropharynx is clear and moist. No oropharyngeal exudate.  Eyes: Conjunctivae and EOM are normal. Pupils are equal, round, and reactive to light. No scleral icterus.  Wears glasses  Neck: Normal range of motion. Neck supple. No tracheal deviation present.  Cardiovascular: Normal rate, regular rhythm and intact distal pulses.   Pulmonary/Chest: Effort normal and breath sounds normal. No stridor. No respiratory distress. She exhibits no tenderness.  Abdominal: Soft. She exhibits no distension and no mass. There is no tenderness. There is no rigidity, no rebound, no guarding, no tenderness at McBurney's point and negative Murphy's sign. No hernia. Hernia confirmed negative in the ventral area, confirmed negative in the right inguinal area and confirmed negative in the left inguinal area.  Genitourinary: No vaginal discharge found.  Exam done with assistance of female Medical Assistant in the room.  Perianal skin clean with good hygiene.  No pruritis.  No pilonidal disease.  No fissure.  No abscess/fistula.    No external skin tags / hemorrhoids of significance.  Tolerates digital and  anoscopic rectal exam.  Normal sphincter tone.  No rectal masses.  Hemorrhoidal piles enlarged R posterior & L ateral, especially with some irritation  Musculoskeletal: Normal range of motion. She exhibits no tenderness.       Right elbow: She exhibits normal range of motion.       Left elbow: She exhibits normal range of motion.       Right wrist: She exhibits normal range of motion.       Left wrist: She exhibits normal range of motion.       Right  hand: Normal strength noted.       Left hand: Normal strength noted.  Lymphadenopathy:       Head (right side): No posterior auricular adenopathy present.       Head (left side): No posterior auricular adenopathy present.    She has no cervical adenopathy.    She has no axillary adenopathy.       Right: No inguinal adenopathy present.       Left: No inguinal adenopathy present.  Neurological: She is oriented to person, place, and time. She appears listless. No cranial nerve deficit. She exhibits normal muscle tone. Coordination normal.  Skin: Skin is warm and dry. No rash noted. She is not diaphoretic. No erythema.  Psychiatric: Her mood appears anxious. Her affect is labile. Her speech is slurred. Cognition and memory are impaired.  Some developmental delay.  Crying but consolable at times.  Then,relaxed.  Then smiling.       Assessment:     Anemia with low rectal bleeding.  Hemorrhoids at least contributing vs. Possible source.  Significant anxiety and history of developmental delay and oxygen requiring COPD.     Plan:     I discussed hemorrhages with the patient and her caregiver for.  I offered options.  I think she would benefit from bandings and she is having bleeding.  She wished to be aggressive and proceed with that:  The anatomy & physiology of the anorectal region was discussed.  The pathophysiology of hemorrhoids and differential diagnosis was discussed.  Natural history progression  was discussed.   I stressed the importance  of a bowel regimen to have daily soft bowel movements to minimize progression of disease.     The patient's symptoms are not adequately controlled.  Therefore, I recommended banding to treat the hemorrhoids.  I went over the technique, risks, benefits, and alternatives.   Goals of post-operative recovery were discussed as well.  Questions were answered.  The patient expressed understanding & wished to proceed.  The patient was positioned in the lateral decubitus position.  Perianal & rectal examination was done.  Using anoscopy, I ligated the hemorrhoids above the dentate line with banding (right posterior & left lateral.  The patient tolerated the procedure well.  After sitting up, she had an episode of pain that scared her and she cried.  Five minutes later, she was calm and relaxed and denied any pain.  Educational handouts further explaining the pathology, treatment options, and bowel regimen were given as well.   Given her oxygen requiring COPD and morbid obesity, I think she would be a much higher risk is an operative candidate.  Would like to avoid an operation.  I have a feeling she would not tolerate that well.  Hopefully If her bowels can become more regular with a good fiber bowel regimen, the hemorrhoids will call down so that she requires no further intervention.

## 2013-03-15 ENCOUNTER — Other Ambulatory Visit: Payer: Self-pay | Admitting: Adult Health

## 2013-05-30 ENCOUNTER — Encounter (INDEPENDENT_AMBULATORY_CARE_PROVIDER_SITE_OTHER): Payer: Self-pay | Admitting: Surgery

## 2013-05-30 ENCOUNTER — Ambulatory Visit (INDEPENDENT_AMBULATORY_CARE_PROVIDER_SITE_OTHER): Payer: No Typology Code available for payment source | Admitting: Surgery

## 2013-05-30 VITALS — BP 134/88 | HR 71 | Temp 98.3°F | Resp 16 | Ht 67.0 in | Wt 262.2 lb

## 2013-05-30 DIAGNOSIS — K625 Hemorrhage of anus and rectum: Secondary | ICD-10-CM

## 2013-05-30 MED ORDER — HYDROCORTISONE ACE-PRAMOXINE 1-1 % RE FOAM: 1.0000 | Freq: Two times a day (BID) | RECTAL | Status: DC

## 2013-05-30 NOTE — Progress Notes (Signed)
Subjective:     Patient ID: Shelley [name], female   DOB: 05-Oct-1965, 47 y.o.   MRN: [id number]  HPI Patient presents surgeon office with chief complaint rectal bleeding. History of intermittent rectal bleeding which was worked up earlier this year with colonoscopy and an upper endoscopy. No obvious source was found. She has been anemic in the past. Seen by Dr. Michaell Cowing and underwent banding of hemorrhoids. She has done well until last 2 weeks when she's had intermittent rectal bleeding. The blood is dark. Her mother states that it's heavy at times. She does sit and strain on the commode at times. Denies any significant abdominal pain. No syncopal episodes. Currently no active bleeding.  Review of Systems  Constitutional: Negative.   Cardiovascular: Negative.   Gastrointestinal: Positive for anal bleeding.       Objective:   Physical Exam  Constitutional: She appears well-nourished.  HENT:  Head: Normocephalic and atraumatic.  Genitourinary: Rectal exam shows no external hemorrhoid, no internal hemorrhoid, no fissure and no mass. Guaiac negative stool.  Skin: Skin is warm and dry.       Assessment:     History of hemorrhoid disease  History of GI bleeding without known source  Patient Active Problem List   Diagnosis Date Noted  . Obesity, Class III, BMI 40-49.9 (morbid obesity) 03/06/2013  . Hemorrhoids, internal, with bleeding 03/06/2013  . Dependence on supplemental oxygen 03/06/2013  . Anxiety   . Mental retardation   . Acute microcytic anemia with rectal bleeding 02/06/2013  . Hypokalemia 02/06/2013  . Hyperglycemia 02/06/2013  . Physical deconditioning 11/08/2012  . Developmental delay 12/02/2011  . Obstructive sleep apnea 12/02/2011  . Chronic respiratory failure 12/02/2011  . ALLERGIC RHINITIS 08/02/2007  . Mild persistent asthma 08/02/2007      Plan:     No evidence of hemorrhoid bleeding today. She has minimal hemorrhoidal disease. May need referral back to GI  medicine if she continues to have heavy GI bleeding for further workup. Return to see Dr. Michaell Cowing one month for hemorrhoid followup. No acute process at this time. Patient and family reassured. Will return if symptoms worsen.

## 2013-05-30 NOTE — Patient Instructions (Signed)
Hemorrhoids Hemorrhoids are swollen veins around the rectum or anus. There are two types of hemorrhoids:   Internal hemorrhoids. These occur in the veins just inside the rectum. They may poke through to the outside and become irritated and painful.  External hemorrhoids. These occur in the veins outside the anus and can be felt as a painful swelling or hard lump near the anus. CAUSES  Pregnancy.   Obesity.   Constipation or diarrhea.   Straining to have a bowel movement.   Sitting for long periods on the toilet.  Heavy lifting or other activity that caused you to strain.  Anal intercourse. SYMPTOMS   Pain.   Anal itching or irritation.   Rectal bleeding.   Fecal leakage.   Anal swelling.   One or more lumps around the anus.  DIAGNOSIS  Your caregiver may be able to diagnose hemorrhoids by visual examination. Other examinations or tests that may be performed include:   Examination of the rectal area with a gloved hand (digital rectal exam).   Examination of anal canal using a small tube (scope).   A blood test if you have lost a significant amount of blood.  A test to look inside the colon (sigmoidoscopy or colonoscopy). TREATMENT Most hemorrhoids can be treated at home. However, if symptoms do not seem to be getting better or if you have a lot of rectal bleeding, your caregiver may perform a procedure to help make the hemorrhoids get smaller or remove them completely. Possible treatments include:   Placing a rubber band at the base of the hemorrhoid to cut off the circulation (rubber band ligation).   Injecting a chemical to shrink the hemorrhoid (sclerotherapy).   Using a tool to burn the hemorrhoid (infrared light therapy).   Surgically removing the hemorrhoid (hemorrhoidectomy).   Stapling the hemorrhoid to block blood flow to the tissue (hemorrhoid stapling).  HOME CARE INSTRUCTIONS   Eat foods with fiber, such as whole grains, beans,  nuts, fruits, and vegetables. Ask your doctor about taking products with added fiber in them (fibersupplements).  Increase fluid intake. Drink enough water and fluids to keep your urine clear or pale yellow.   Exercise regularly.   Go to the bathroom when you have the urge to have a bowel movement. Do not wait.   Avoid straining to have bowel movements.   Keep the anal area dry and clean. Use wet toilet paper or moist towelettes after a bowel movement.   Medicated creams and suppositories may be used or applied as directed.   Only take over-the-counter or prescription medicines as directed by your caregiver.   Take warm sitz baths for 15 20 minutes, 3 4 times a day to ease pain and discomfort.   Place ice packs on the hemorrhoids if they are tender and swollen. Using ice packs between sitz baths may be helpful.   Put ice in a plastic bag.   Place a towel between your skin and the bag.   Leave the ice on for 15 20 minutes, 3 4 times a day.   Do not use a donut-shaped pillow or sit on the toilet for long periods. This increases blood pooling and pain.  SEEK MEDICAL CARE IF:  You have increasing pain and swelling that is not controlled by treatment or medicine.  You have uncontrolled bleeding.  You have difficulty or you are unable to have a bowel movement.  You have pain or inflammation outside the area of the hemorrhoids. MAKE SURE YOU:    Understand these instructions.  Will watch your condition.  Will get help right away if you are not doing well or get worse. Document Released: 05/27/2000 Document Revised: 05/16/2012 Document Reviewed: 04/03/2012 [employer] Patient Information 2014 [employer].  

## 2013-06-21 ENCOUNTER — Telehealth (INDEPENDENT_AMBULATORY_CARE_PROVIDER_SITE_OTHER): Payer: Self-pay

## 2013-06-21 NOTE — Telephone Encounter (Signed)
The patient's caregiver called and stated the proctofoam is not covered by the insurance and it's expensive.  She wants to know if there is a cheaper alternative

## 2013-06-21 NOTE — Telephone Encounter (Signed)
I called and notified Kennon RoundsSally what Dr Luisa Hartornett recommended

## 2013-06-21 NOTE — Telephone Encounter (Signed)
anusol or prep H similar OTC

## 2013-06-24 ENCOUNTER — Ambulatory Visit (INDEPENDENT_AMBULATORY_CARE_PROVIDER_SITE_OTHER): Payer: Medicare Other | Admitting: Pulmonary Disease

## 2013-06-24 ENCOUNTER — Encounter: Payer: Self-pay | Admitting: Pulmonary Disease

## 2013-06-24 VITALS — BP 110/82 | HR 82 | Ht 67.0 in | Wt 255.0 lb

## 2013-06-24 DIAGNOSIS — G4733 Obstructive sleep apnea (adult) (pediatric): Secondary | ICD-10-CM

## 2013-06-24 DIAGNOSIS — J45909 Unspecified asthma, uncomplicated: Secondary | ICD-10-CM

## 2013-06-24 DIAGNOSIS — J9610 Chronic respiratory failure, unspecified whether with hypoxia or hypercapnia: Secondary | ICD-10-CM

## 2013-06-24 DIAGNOSIS — J45998 Other asthma: Secondary | ICD-10-CM

## 2013-06-24 NOTE — Progress Notes (Signed)
Chief Complaint  Patient presents with  . Asthma    Breathing has been doing well. Mother reports wheezing from to time to time. Denies SOB, chest tightness or coughing at this time.  . Sleep Apnea    Has not been using CPAP machine.     History of Present Illness: Shelley [name] is a 48 y.o. female with OSA, OHS, and asthma.  Her asthma is stable.  She is not having much cough, wheeze, or sputum.  She is not having sinus congestion.  She uses pulmicort once in the morning, and singulair at night.  She is not needing to use albuterol much.  She has not been using BiPAP regularly.  She did not understand why she needed to use this.  She has been using oxygen 24/7.  Her home care nurse told her she could decrease her oxygen to 2 liters, but she wasn't sure she should do this.    Tests: PSG 09/13/10>>AHI 98.6, SpO2 60% ONO with CPAP and 3 liters 01/13/12>>Test time 7 hrs 6 min. Mean SpO2 89%, low SpO2 62%. Spent 2 hrs 34 min with SpO2 < 88% CPAP 12/16/11 to 01/14/12>>Used on 6 of 30 nights with average 7 hrs 3 min. Average AHI 1.8 with CPAP 10 cm H2O. ONO with CPAP and 3 liters 01/13/12 >> Test time 7 hrs 6 min.  Mean SpO2 89%, low SpO2 62%.  Spent 2 hrs 34 min with SpO2 < 88%. ONO with CPAP and 4 liters 01/31/12 >> Test time 7 hrs 36 min.  Mean SpO2 91.6%, low SpO2 73%.  Spent 56 min with SpO2 < 88%. BPAP 02/20/12>>17/11 cm H2O with 4 liters oxygen.  She  has a past medical history of Asthma; Diabetes mellitus; Hypertension; Hyperlipidemia; Pneumonia; Anxiety; GERD (gastroesophageal reflux disease); Arthritis; Mental retardation; Hiatal hernia; AVM (arteriovenous malformation) of colon; Hemorrhoids; and Diverticulosis.  She  has past surgical history that includes Cholecystectomy; Eye surgery; Colonoscopy (N/A, 02/06/2013); and Esophagogastroduodenoscopy (N/A, 02/06/2013).   Outpatient Encounter Prescriptions as of 06/24/2013  Medication Sig  . acetaminophen (TYLENOL) 500 MG tablet Take 500  mg by mouth every 6 (six) hours as needed for pain.  Marland Kitchen. albuterol (PROVENTIL) (2.5 MG/3ML) 0.083% nebulizer solution Take 3 mLs (2.5 mg total) by nebulization every 6 (six) hours as needed for wheezing or shortness of breath.  . budesonide (PULMICORT) 0.25 MG/2ML nebulizer solution Inhale one vial daily  . ergocalciferol (VITAMIN D2) 50000 UNITS capsule Take 50,000 Units by mouth once a week.  . fenofibrate 160 MG tablet Take 160 mg by mouth daily.  . ferrous gluconate (FERGON) 216 MG tablet Take 1 tablet (216 mg total) by mouth 3 (three) times daily with meals.  Marland Kitchen. loratadine (CLARITIN) 10 MG tablet Take 10 mg by mouth daily as needed. For allergies  . losartan-hydrochlorothiazide (HYZAAR) 100-25 MG per tablet Take 1 tablet by mouth daily.  . montelukast (SINGULAIR) 10 MG tablet Take 10 mg by mouth at bedtime.  Marland Kitchen. omeprazole (PRILOSEC) 40 MG capsule Take 40 mg by mouth daily.   . sertraline (ZOLOFT) 50 MG tablet Take 50 mg by mouth daily.  . [DISCONTINUED] budesonide (PULMICORT) 0.25 MG/2ML nebulizer solution Inhale one vial via nebulizer twice daily  . [DISCONTINUED] hydrocortisone (ANUSOL-HC) 25 MG suppository Place 1 suppository (25 mg total) rectally 2 (two) times daily.  . [DISCONTINUED] hydrocortisone-pramoxine (PROCTOFOAM HC) rectal foam Place 1 applicator rectally 2 (two) times daily.    Allergies  Allergen Reactions  . Fish Allergy Swelling  . Pepto-Bismol [  Bismuth Subsalicylate] Nausea And Vomiting  . Cortisone Itching and Rash    Injections and cream only.  PO ok.    Physical Exam:  General - No distress, wearing oxygen ENT - No sinus tenderness, MP 4, no oral exudate, no LAN Cardiac - s1s2 regular, no murmur Chest - decreased breath sounds, no wheeze/rales Back - no focal tenderness Abd - soft, non-tender Ext - no edema Neuro - normal strength Skin - no rashes Psych - pleasant demeanor  Assessment/Plan:  [name], MD [facility] Pager:   [phone] 06/24/2013, 12:19 PM

## 2013-06-24 NOTE — Patient Instructions (Signed)
Follow up in 6 months 

## 2013-06-25 NOTE — Assessment & Plan Note (Signed)
She is to continue daily pulmicort and singulair at night, with prn albuterol.

## 2013-06-25 NOTE — Assessment & Plan Note (Signed)
She did well with 2 liters oxygen on exertion.  Will have her oxygen change to 2 liters at rest and exertion during the day, but she is to continue 4 liters at night with BiPAP.

## 2013-06-25 NOTE — Assessment & Plan Note (Signed)
I explained to her how severe sleep apnea can affect her function and her health.  She has a better understanding of need for treating her sleep apnea, and will be more compliant with BiPAP therapy.

## 2014-01-07 ENCOUNTER — Encounter: Payer: Self-pay | Admitting: Pulmonary Disease

## 2014-01-07 ENCOUNTER — Ambulatory Visit (INDEPENDENT_AMBULATORY_CARE_PROVIDER_SITE_OTHER): Payer: Medicare Other | Admitting: Pulmonary Disease

## 2014-01-07 VITALS — BP 124/86 | HR 95 | Temp 99.9°F | Ht 67.0 in | Wt 248.2 lb

## 2014-01-07 DIAGNOSIS — J9610 Chronic respiratory failure, unspecified whether with hypoxia or hypercapnia: Secondary | ICD-10-CM

## 2014-01-07 DIAGNOSIS — J45909 Unspecified asthma, uncomplicated: Secondary | ICD-10-CM

## 2014-01-07 DIAGNOSIS — J45998 Other asthma: Secondary | ICD-10-CM

## 2014-01-07 DIAGNOSIS — G4733 Obstructive sleep apnea (adult) (pediatric): Secondary | ICD-10-CM

## 2014-01-07 NOTE — Patient Instructions (Signed)
Follow up in 6 months 

## 2014-01-07 NOTE — Progress Notes (Signed)
Chief Complaint  Patient presents with  . Follow-up    81mo CPAP>> Pt states that she has not worn her CPAP since March d/t mask falling off face-not comfortable. Pt reports that she wears her O2 "most night"-- states that some nights it falls out of nose.    History of Present Illness: Shelley [name] is a 48 y.o. female with OSA, OHS, and asthma.  She has not been using BiPAP all the time.  She sleep much better with BiPAP and is more awake during the day.  She does not like having to use it all the time, and her mask is incomfortable.  She has been using oxygen.  She was told by her caregiver that she has to use 4 liters oxygen 24/7.  She is not having much trouble with her breathing otherwise.  She is not having cough, wheeze, or chest congestion.  Tests: PSG 09/13/10>>AHI 98.6, SpO2 60% ONO with CPAP and 3 liters 01/13/12>>Test time 7 hrs 6 min. Mean SpO2 89%, low SpO2 62%. Spent 2 hrs 34 min with SpO2 < 88% CPAP 12/16/11 to 01/14/12>>Used on 6 of 30 nights with average 7 hrs 3 min. Average AHI 1.8 with CPAP 10 cm H2O. ONO with CPAP and 3 liters 01/13/12 >> Test time 7 hrs 6 min.  Mean SpO2 89%, low SpO2 62%.  Spent 2 hrs 34 min with SpO2 < 88%. ONO with CPAP and 4 liters 01/31/12 >> Test time 7 hrs 36 min.  Mean SpO2 91.6%, low SpO2 73%.  Spent 56 min with SpO2 < 88%. BPAP 02/20/12>>17/11 cm H2O with 4 liters oxygen.  PMHx, PSHx, Medications, Allergies, Fhx, Shx reviewed.  Physical Exam:  General - No distress, wearing oxygen ENT - No sinus tenderness, MP 4, no oral exudate, no LAN Cardiac - s1s2 regular, no murmur Chest - decreased breath sounds, no wheeze/rales Back - no focal tenderness Abd - soft, non-tender Ext - no edema Neuro - normal strength Skin - no rashes Psych - pleasant demeanor  Assessment/Plan:  Shelley HellingVineet Brileigh Sevcik, MD [facility]/Sleep Pager:  [phone] 01/07/2014, 10:37 AM

## 2014-01-10 NOTE — Assessment & Plan Note (Signed)
She is to continue daily pulmicort and singulair at night, with prn albuterol.

## 2014-01-10 NOTE — Assessment & Plan Note (Signed)
I discussed with her the importance of maintaining her compliance with BiPAP, and how uncontrolled severe sleep apnea can affect her health.  Will have her DME assess her mask fit.

## 2014-01-10 NOTE — Assessment & Plan Note (Signed)
Explained to her that she only needs to use 4 liters oxygen at night with BiPAP.  She can use 2 liters oxygen during the day with exertion.

## 2014-08-04 ENCOUNTER — Ambulatory Visit (INDEPENDENT_AMBULATORY_CARE_PROVIDER_SITE_OTHER): Payer: Medicare Other | Admitting: Pulmonary Disease

## 2014-08-04 ENCOUNTER — Encounter: Payer: Self-pay | Admitting: Pulmonary Disease

## 2014-08-04 VITALS — BP 114/88 | HR 92 | Temp 99.3°F | Ht 67.0 in | Wt 254.0 lb

## 2014-08-04 DIAGNOSIS — J45998 Other asthma: Secondary | ICD-10-CM

## 2014-08-04 DIAGNOSIS — J9610 Chronic respiratory failure, unspecified whether with hypoxia or hypercapnia: Secondary | ICD-10-CM

## 2014-08-04 DIAGNOSIS — G4733 Obstructive sleep apnea (adult) (pediatric): Secondary | ICD-10-CM

## 2014-08-04 NOTE — Patient Instructions (Signed)
Use pulmicort twice per day >> rinse mouth after each use Use BiPAP whenever you are sleeping Follow up in 6 months

## 2014-08-04 NOTE — Progress Notes (Signed)
Chief Complaint  Patient presents with  . Follow-up    Pt using BiPAP "some" nights with 4L O2 bled into machine. Pt states that she is not using BiPAP nightly because some nights she falls asleep before putting mask on. Denies any increased breathing issues - some wheezing.     History of Present Illness: Shelley [name] is a 49 y.o. female with OSA, OHS, and asthma.  She says she uses BiPAP about 4 to 5 times per week.  She will fall asleep sometimes before putting mask on.  She sleeps better when she uses BiPAP.  She remains on oxygen 24/7.    She has noticed more trouble with wheezing.  She is only using pulmicort once per day.  She uses albuterol occasionally.  Tests: PSG 09/13/10>>AHI 98.6, SpO2 60% ONO with CPAP and 3 liters 01/13/12>>Test time 7 hrs 6 min. Mean SpO2 89%, low SpO2 62%. Spent 2 hrs 34 min with SpO2 < 88% CPAP 12/16/11 to 01/14/12>>Used on 6 of 30 nights with average 7 hrs 3 min. Average AHI 1.8 with CPAP 10 cm H2O. ONO with CPAP and 3 liters 01/13/12 >> Test time 7 hrs 6 min.  Mean SpO2 89%, low SpO2 62%.  Spent 2 hrs 34 min with SpO2 < 88%. ONO with CPAP and 4 liters 01/31/12 >> Test time 7 hrs 36 min.  Mean SpO2 91.6%, low SpO2 73%.  Spent 56 min with SpO2 < 88%. BPAP 02/20/12>>17/11 cm H2O with 4 liters oxygen.  PMHx >> DM, HTN, HLD, Anxiety, GERD, HH, Developmental delay  PSHx, Medications, Allergies, Fhx, Shx reviewed.  Physical Exam: Blood pressure 114/88, pulse 92, temperature 99.3 F (37.4 C), temperature source Oral, height 5\' 7"  (1.702 m), weight 254 lb (115.214 kg), SpO2 96 %. Body mass index is 39.77 kg/(m^2).  General - No distress, wearing oxygen ENT - No sinus tenderness, MP 4, no oral exudate, no LAN Cardiac - s1s2 regular, no murmur Chest - decreased breath sounds, no wheeze/rales Back - no focal tenderness Abd - soft, non-tender Ext - no edema Neuro - normal strength Skin - no rashes Psych - pleasant  demeanor  Assessment/Plan:  Obstructive sleep apnea. Plan: - advised her to use BiPAP whenever she is asleep  Chronic respiratory failure with hypoxia from OHS. Plan: - continue 4 liters oxygen at night with BiPAP and 2 liters oxygen during the day  Asthma. Plan: - advised her to use pulmicort BID and prn albuterol   Coralyn HellingVineet Fredi Geiler, MD [facility]Critical [facility]/Sleep Pager:  [phone] 08/04/2014, 3:45 PM

## 2015-03-04 ENCOUNTER — Ambulatory Visit: Payer: Self-pay | Admitting: *Deleted

## 2015-03-13 ENCOUNTER — Ambulatory Visit (INDEPENDENT_AMBULATORY_CARE_PROVIDER_SITE_OTHER): Payer: Medicare Other | Admitting: Pulmonary Disease

## 2015-03-13 ENCOUNTER — Encounter: Payer: Self-pay | Admitting: Pulmonary Disease

## 2015-03-13 VITALS — BP 142/78 | HR 101 | Temp 97.8°F | Ht 67.0 in | Wt 270.8 lb

## 2015-03-13 DIAGNOSIS — Z23 Encounter for immunization: Secondary | ICD-10-CM | POA: Diagnosis not present

## 2015-03-13 DIAGNOSIS — J45998 Other asthma: Secondary | ICD-10-CM | POA: Diagnosis not present

## 2015-03-13 DIAGNOSIS — J9610 Chronic respiratory failure, unspecified whether with hypoxia or hypercapnia: Secondary | ICD-10-CM

## 2015-03-13 DIAGNOSIS — E662 Morbid (severe) obesity with alveolar hypoventilation: Secondary | ICD-10-CM | POA: Diagnosis not present

## 2015-03-13 DIAGNOSIS — G4733 Obstructive sleep apnea (adult) (pediatric): Secondary | ICD-10-CM

## 2015-03-13 NOTE — Progress Notes (Signed)
Chief Complaint  Patient presents with  . Follow-up    pt states she is doing well. pt using BIPAP when she feels like it.  pt on O2 at all times set at 4LPM at home, and 3LPM on pulse when she is out.  pt states she has been wheezing and increase SOB.  DME: [facility]    History of Present Illness: Shelley [name] is a 49 y.o. female with OSA, OHS, and asthma.  She sporadically using budesonide.  She is not always using albuterol.  She gets episodes of wheezing and chest congestion.  She denies sinus congestion or sore throat.  She is taking singulair at night.  She uses her oxygen 24/7, but this sometimes comes off at night.  She is not using her BiPAP consistantly, but says she started using again this past week.  Tests: PSG 09/13/10>>AHI 98.6, SpO2 60% ONO with CPAP and 3 liters 01/13/12>>Test time 7 hrs 6 min. Mean SpO2 89%, low SpO2 62%. Spent 2 hrs 34 min with SpO2 < 88% CPAP 12/16/11 to 01/14/12>>Used on 6 of 30 nights with average 7 hrs 3 min. Average AHI 1.8 with CPAP 10 cm H2O. ONO with CPAP and 3 liters 01/13/12 >> Test time 7 hrs 6 min.  Mean SpO2 89%, low SpO2 62%.  Spent 2 hrs 34 min with SpO2 < 88%. ONO with CPAP and 4 liters 01/31/12 >> Test time 7 hrs 36 min.  Mean SpO2 91.6%, low SpO2 73%.  Spent 56 min with SpO2 < 88%. BPAP 02/20/12>>17/11 cm H2O with 4 liters oxygen.  PMHx >> DM, HTN, HLD, Anxiety, GERD, HH, Developmental delay  PSHx, Medications, Allergies, Fhx, Shx reviewed.  Physical Exam: BP 142/78 mmHg  Pulse 101  Temp(Src) 97.8 F (36.6 C) (Oral)  Ht  (1.702 m)  Wt 270 lb 12.8 oz (122.834 kg)  BMI 42.40 kg/m2  SpO2 97%  General - No distress, wearing oxygen ENT - No sinus tenderness, MP 4, no oral exudate, no LAN Cardiac - s1s2 regular, no murmur Chest - decreased breath sounds, no wheeze/rales Back - no focal tenderness Abd - soft, non-tender Ext - no edema Neuro - normal strength Skin - no rashes Psych - pleasant  demeanor  Assessment/Plan:  Obstructive sleep apnea. Plan: - advised her to use BiPAP whenever she is asleep  Chronic respiratory failure with hypoxia from OHS. Plan: - continue 4 liters oxygen at night with BiPAP and 2 liters oxygen during the day  Asthma. Plan: - advised her to use pulmicort BID, and singulair qhs - she can use prn albuterol >> discussed when she should use albuterol  Morbid obesity. Plan: - discussed importance of weight loss   [name], MD [place] [facility]Critical Care/Sleep Pager:  [phone] 03/13/2015, 10:05 AM

## 2015-03-13 NOTE — Patient Instructions (Signed)
Use budesonide (orange box) twice per day >> brush your teeth after each use Use singulair (brown pill) nightly before bedtime Use albuterol (blue box) every 4 to 6 hours as needed if you have more cough, wheezing, or chest congestion Use BiPAP machine whenever you are asleep  Flu shot today  Follow up in 6 months

## 2015-04-07 ENCOUNTER — Ambulatory Visit: Payer: Self-pay | Admitting: Dietician

## 2015-06-03 ENCOUNTER — Other Ambulatory Visit: Payer: Self-pay | Admitting: Pulmonary Disease

## 2015-06-04 ENCOUNTER — Telehealth: Payer: Self-pay | Admitting: Pulmonary Disease

## 2015-06-04 MED ORDER — ALBUTEROL SULFATE (2.5 MG/3ML) 0.083% IN NEBU: 2.5000 mg | INHALATION_SOLUTION | Freq: Four times a day (QID) | RESPIRATORY_TRACT | Status: DC | PRN

## 2015-06-04 MED ORDER — BUDESONIDE 0.25 MG/2ML IN SUSP: RESPIRATORY_TRACT | Status: DC

## 2015-06-04 NOTE — Telephone Encounter (Signed)
lmtcb x1 for pt. 

## 2015-06-04 NOTE — Telephone Encounter (Signed)
Spoke with pt, requesting below refills to below verified pharmacy.  This has been sent.  Nothing further needed.

## 2015-06-19 DIAGNOSIS — K649 Unspecified hemorrhoids: Secondary | ICD-10-CM | POA: Insufficient documentation

## 2015-07-29 ENCOUNTER — Encounter (HOSPITAL_COMMUNITY): Payer: Self-pay | Admitting: Emergency Medicine

## 2015-07-29 ENCOUNTER — Inpatient Hospital Stay (HOSPITAL_COMMUNITY)
Admission: EM | Admit: 2015-07-29 | Discharge: 2015-08-01 | DRG: 378 | Payer: Medicare Other | Attending: Student in an Organized Health Care Education/Training Program | Admitting: Student in an Organized Health Care Education/Training Program

## 2015-07-29 DIAGNOSIS — F79 Unspecified intellectual disabilities: Secondary | ICD-10-CM | POA: Diagnosis present

## 2015-07-29 DIAGNOSIS — K5790 Diverticulosis of intestine, part unspecified, without perforation or abscess without bleeding: Secondary | ICD-10-CM | POA: Diagnosis present

## 2015-07-29 DIAGNOSIS — K529 Noninfective gastroenteritis and colitis, unspecified: Secondary | ICD-10-CM | POA: Diagnosis not present

## 2015-07-29 DIAGNOSIS — Z7951 Long term (current) use of inhaled steroids: Secondary | ICD-10-CM | POA: Diagnosis not present

## 2015-07-29 DIAGNOSIS — E872 Acidosis: Secondary | ICD-10-CM | POA: Diagnosis present

## 2015-07-29 DIAGNOSIS — F819 Developmental disorder of scholastic skills, unspecified: Secondary | ICD-10-CM | POA: Diagnosis present

## 2015-07-29 DIAGNOSIS — Z9119 Patient's noncompliance with other medical treatment and regimen: Secondary | ICD-10-CM

## 2015-07-29 DIAGNOSIS — F419 Anxiety disorder, unspecified: Secondary | ICD-10-CM | POA: Diagnosis present

## 2015-07-29 DIAGNOSIS — J45909 Unspecified asthma, uncomplicated: Secondary | ICD-10-CM | POA: Diagnosis present

## 2015-07-29 DIAGNOSIS — K922 Gastrointestinal hemorrhage, unspecified: Secondary | ICD-10-CM

## 2015-07-29 DIAGNOSIS — Z6841 Body Mass Index (BMI) 40.0 and over, adult: Secondary | ICD-10-CM

## 2015-07-29 DIAGNOSIS — Z7984 Long term (current) use of oral hypoglycemic drugs: Secondary | ICD-10-CM | POA: Diagnosis not present

## 2015-07-29 DIAGNOSIS — D62 Acute posthemorrhagic anemia: Secondary | ICD-10-CM | POA: Diagnosis not present

## 2015-07-29 DIAGNOSIS — E119 Type 2 diabetes mellitus without complications: Secondary | ICD-10-CM | POA: Diagnosis present

## 2015-07-29 DIAGNOSIS — J9610 Chronic respiratory failure, unspecified whether with hypoxia or hypercapnia: Secondary | ICD-10-CM | POA: Diagnosis present

## 2015-07-29 DIAGNOSIS — Z9981 Dependence on supplemental oxygen: Secondary | ICD-10-CM

## 2015-07-29 DIAGNOSIS — I10 Essential (primary) hypertension: Secondary | ICD-10-CM | POA: Diagnosis present

## 2015-07-29 DIAGNOSIS — Z9114 Patient's other noncompliance with medication regimen: Secondary | ICD-10-CM

## 2015-07-29 DIAGNOSIS — J9612 Chronic respiratory failure with hypercapnia: Secondary | ICD-10-CM | POA: Diagnosis not present

## 2015-07-29 DIAGNOSIS — K219 Gastro-esophageal reflux disease without esophagitis: Secondary | ICD-10-CM | POA: Diagnosis present

## 2015-07-29 DIAGNOSIS — J984 Other disorders of lung: Secondary | ICD-10-CM | POA: Diagnosis present

## 2015-07-29 DIAGNOSIS — G4733 Obstructive sleep apnea (adult) (pediatric): Secondary | ICD-10-CM | POA: Diagnosis present

## 2015-07-29 DIAGNOSIS — E785 Hyperlipidemia, unspecified: Secondary | ICD-10-CM | POA: Diagnosis present

## 2015-07-29 DIAGNOSIS — J45998 Other asthma: Secondary | ICD-10-CM | POA: Diagnosis present

## 2015-07-29 DIAGNOSIS — D649 Anemia, unspecified: Secondary | ICD-10-CM | POA: Diagnosis present

## 2015-07-29 DIAGNOSIS — Z8774 Personal history of (corrected) congenital malformations of heart and circulatory system: Secondary | ICD-10-CM

## 2015-07-29 DIAGNOSIS — Z825 Family history of asthma and other chronic lower respiratory diseases: Secondary | ICD-10-CM

## 2015-07-29 DIAGNOSIS — D500 Iron deficiency anemia secondary to blood loss (chronic): Secondary | ICD-10-CM | POA: Diagnosis present

## 2015-07-29 DIAGNOSIS — E662 Morbid (severe) obesity with alveolar hypoventilation: Secondary | ICD-10-CM | POA: Diagnosis not present

## 2015-07-29 DIAGNOSIS — Z9049 Acquired absence of other specified parts of digestive tract: Secondary | ICD-10-CM | POA: Diagnosis not present

## 2015-07-29 DIAGNOSIS — Z79899 Other long term (current) drug therapy: Secondary | ICD-10-CM | POA: Diagnosis not present

## 2015-07-29 DIAGNOSIS — F329 Major depressive disorder, single episode, unspecified: Secondary | ICD-10-CM | POA: Diagnosis present

## 2015-07-29 DIAGNOSIS — F411 Generalized anxiety disorder: Secondary | ICD-10-CM | POA: Diagnosis present

## 2015-07-29 DIAGNOSIS — Z66 Do not resuscitate: Secondary | ICD-10-CM | POA: Diagnosis present

## 2015-07-29 DIAGNOSIS — K648 Other hemorrhoids: Secondary | ICD-10-CM | POA: Diagnosis present

## 2015-07-29 DIAGNOSIS — K5521 Angiodysplasia of colon with hemorrhage: Secondary | ICD-10-CM | POA: Diagnosis not present

## 2015-07-29 LAB — CBC
HEMATOCRIT: 22.5 % — AB (ref 36.0–46.0)
Hemoglobin: 6 g/dL — CL (ref 12.0–15.0)
MCH: 19.8 pg — ABNORMAL LOW (ref 26.0–34.0)
MCHC: 26.7 g/dL — AB (ref 30.0–36.0)
MCV: 74.3 fL — AB (ref 78.0–100.0)
Platelets: 155 10*3/uL (ref 150–400)
RBC: 3.03 MIL/uL — ABNORMAL LOW (ref 3.87–5.11)
RDW: 18.4 % — AB (ref 11.5–15.5)
WBC: 3.4 10*3/uL — ABNORMAL LOW (ref 4.0–10.5)

## 2015-07-29 LAB — COMPREHENSIVE METABOLIC PANEL
ALBUMIN: 3.2 g/dL — AB (ref 3.5–5.0)
ALT: 16 U/L (ref 14–54)
AST: 21 U/L (ref 15–41)
Alkaline Phosphatase: 70 U/L (ref 38–126)
Anion gap: 11 (ref 5–15)
BILIRUBIN TOTAL: 0.3 mg/dL (ref 0.3–1.2)
BUN: 8 mg/dL (ref 6–20)
CHLORIDE: 98 mmol/L — AB (ref 101–111)
CO2: 33 mmol/L — ABNORMAL HIGH (ref 22–32)
CREATININE: 0.58 mg/dL (ref 0.44–1.00)
Calcium: 8.5 mg/dL — ABNORMAL LOW (ref 8.9–10.3)
GFR calc Af Amer: >60 mL/min (ref >60)
GLUCOSE: 157 mg/dL — AB (ref 65–99)
POTASSIUM: 3.8 mmol/L (ref 3.5–5.1)
Sodium: 142 mmol/L (ref 135–145)
Total Protein: 7.1 g/dL (ref 6.5–8.1)

## 2015-07-29 LAB — PHOSPHORUS: Phosphorus: 3.4 mg/dL (ref 2.5–4.6)

## 2015-07-29 LAB — POC OCCULT BLOOD, ED: FECAL OCCULT BLD: POSITIVE — AB

## 2015-07-29 LAB — ABO/RH: ABO/RH(D): A POS

## 2015-07-29 LAB — PROTIME-INR
INR: 0.99 (ref 0.00–1.49)
Prothrombin Time: 13.3 seconds (ref 11.6–15.2)

## 2015-07-29 LAB — PREPARE RBC (CROSSMATCH)

## 2015-07-29 LAB — MAGNESIUM: Magnesium: 1.8 mg/dL (ref 1.7–2.4)

## 2015-07-29 MED ORDER — SODIUM CHLORIDE 0.9 % IV SOLN
INTRAVENOUS | Status: AC
  Administered 2015-07-30: 03:00:00 via INTRAVENOUS

## 2015-07-29 MED ORDER — ALBUTEROL SULFATE (2.5 MG/3ML) 0.083% IN NEBU: 2.5000 mg | INHALATION_SOLUTION | Freq: Four times a day (QID) | RESPIRATORY_TRACT | Status: DC | PRN

## 2015-07-29 MED ORDER — ONDANSETRON HCL 4 MG PO TABS: 4.0000 mg | ORAL_TABLET | Freq: Four times a day (QID) | ORAL | Status: DC | PRN

## 2015-07-29 MED ORDER — SODIUM CHLORIDE 0.9% FLUSH
3.0000 mL | Freq: Two times a day (BID) | INTRAVENOUS | Status: DC
  Administered 2015-07-30 – 2015-08-01 (×4): 3 mL via INTRAVENOUS

## 2015-07-29 MED ORDER — ACETAMINOPHEN 325 MG PO TABS
650.0000 mg | ORAL_TABLET | Freq: Four times a day (QID) | ORAL | Status: DC | PRN
Start: 2015-07-29 — End: 2015-08-01

## 2015-07-29 MED ORDER — WITCH HAZEL-GLYCERIN EX PADS
MEDICATED_PAD | CUTANEOUS | Status: DC | PRN
  Filled 2015-07-29: qty 100

## 2015-07-29 MED ORDER — ONDANSETRON HCL 4 MG/2ML IJ SOLN
4.0000 mg | Freq: Four times a day (QID) | INTRAMUSCULAR | Status: DC | PRN
Start: 2015-07-29 — End: 2015-08-01

## 2015-07-29 MED ORDER — ACETAMINOPHEN 650 MG RE SUPP: 650.0000 mg | Freq: Four times a day (QID) | RECTAL | Status: DC | PRN

## 2015-07-29 MED ORDER — BUDESONIDE 0.25 MG/2ML IN SUSP
0.2500 mg | Freq: Two times a day (BID) | RESPIRATORY_TRACT | Status: DC
  Administered 2015-07-30 – 2015-08-01 (×4): 0.25 mg via RESPIRATORY_TRACT
  Filled 2015-07-29 (×5): qty 2

## 2015-07-29 MED ORDER — PANTOPRAZOLE SODIUM 40 MG IV SOLR
40.0000 mg | INTRAVENOUS | Status: DC
  Administered 2015-07-30 – 2015-07-31 (×3): 40 mg via INTRAVENOUS
  Filled 2015-07-29 (×3): qty 40

## 2015-07-29 MED ORDER — SODIUM CHLORIDE 0.9 % IV SOLN
10.0000 mL/h | Freq: Once | INTRAVENOUS | Status: AC
  Administered 2015-07-29: 10 mL/h via INTRAVENOUS

## 2015-07-29 MED ORDER — SERTRALINE HCL 100 MG PO TABS
100.0000 mg | ORAL_TABLET | Freq: Every day | ORAL | Status: DC
  Administered 2015-07-30 – 2015-08-01 (×4): 100 mg via ORAL
  Filled 2015-07-29 (×4): qty 1

## 2015-07-29 NOTE — ED Notes (Signed)
Admitting MD at the bedside.  

## 2015-07-29 NOTE — ED Notes (Signed)
Pt placed into gown and on to monitor upon arrival to room. Pt monitored by blood pressure and pulse ox.  

## 2015-07-29 NOTE — Progress Notes (Signed)
Patient stated that she will let RN know when she is ready to be placed on BiPAP for the night.

## 2015-07-29 NOTE — ED Provider Notes (Signed)
CSN: [id number]     Arrival date & time 07/29/15  1551 History   First MD Initiated Contact with Patient 07/29/15 1701     Chief Complaint  Patient presents with  . Weakness  . Hemorrhoids   HPI   50 year old female with a past medical history of morbid obesity, chronic respiratory failure secondary to restrictive lung disease obstructive sleep apnea on chronic nocturnal BiPAP and home oxygen, cecal AVMs, diverticulosis, mental retardation. Patients caregiver present at the time of evaluation reporting patient has a history of hemorrhoids, has had intermittent bleeding over the last 2 years. She reports over the last 2 weeks she's had worsening blood (done with bowel movements reporting that it is so significant that covers the walls of the toilet after bowel movements. She states that when she changes her diapers she has significant amount of blood in them. She notes this is been worse over the last 2 weeks, with the addition of weakness, and fatigue. She states patient is unable to ambulate on her own at this point due to weakness. She reports that she was hospitalized in 2014, saw Dr. Elnoria Howard with colonoscopy, upper endoscopy with impression of nonbleeding cecal AVM, scattered diverticula, internal/external hemorrhoids. Caregiver notes that she had followed up with general surgery, has had banding, but was unable to undergo surgery due to patient's obesity. Patient presently denies nausea, vomiting, fever, abnormal bruising or bleeding other than that noted above, productive cough, chest pain. Patient notes that she has right sided abdominal pain, notes that this is chronic, unchanged.  Caregiver notes a history of chronic respiratory failure. Patient is on 4 L at home, noncompliant with medications. She denies any acute changes and an patient's baseline respiratory status, notes that patient's oxygen saturation was 85 on room air this morning after taking her oxygen off. She notes she struggles with  blood sugar has she is taking her medication but noncompliant with proper diet.    Past Medical History  Diagnosis Date  . Asthma   . Diabetes mellitus   . Hypertension   . Hyperlipidemia   . Pneumonia   . Anxiety   . GERD (gastroesophageal reflux disease)   . Arthritis   . Mental retardation   . Hiatal hernia   . AVM (arteriovenous malformation) of colon   . Hemorrhoids     Internal and external  . Diverticulosis    Past Surgical History  Procedure Laterality Date  . Cholecystectomy      2006  . Eye surgery    . Colonoscopy N/A 02/06/2013    Procedure: COLONOSCOPY;  Surgeon: Theda Belfast, MD;  Location: [facility];  Service: Endoscopy;  Laterality: N/A;  . Esophagogastroduodenoscopy N/A 02/06/2013    Procedure: ESOPHAGOGASTRODUODENOSCOPY (EGD);  Surgeon: Theda Belfast, MD;  Location: [facility];  Service: Endoscopy;  Laterality: N/A;   Family History  Problem Relation Age of Onset  . COPD Mother    Social History  Substance Use Topics  . Smoking status: Never Smoker   . Smokeless tobacco: Never Used  . Alcohol Use: No   OB History    No data available     Review of Systems  All other systems reviewed and are negative.  Allergies  Fish allergy; Pepto-bismol; Cortisone; and Trichophyton  Home Medications   Prior to Admission medications   Medication Sig Start Date End Date Taking? Authorizing Provider  albuterol (PROVENTIL) (2.5 MG/3ML) 0.083% nebulizer solution Take 3 mLs (2.5 mg total) by nebulization every 6 (six) hours  as needed for wheezing or shortness of breath. 06/04/15  Yes [name], MD  budesonide (PULMICORT) 0.25 MG/2ML nebulizer solution INHALE 1 VIAL VIA NEBULIZER TWICE DAILY. 06/04/15  Yes [name], MD  fenofibrate 160 MG tablet Take 160 mg by mouth daily.   Yes Historical Provider, MD  FERREX 150 150 MG capsule Take 150 mg by mouth daily. 07/22/15  Yes Historical Provider, MD  loratadine (CLARITIN) 10 MG tablet Take 10 mg by mouth  daily as needed. For allergies   Yes Historical Provider, MD  losartan-hydrochlorothiazide (HYZAAR) 100-25 MG per tablet Take 1 tablet by mouth daily.   Yes Historical Provider, MD  metFORMIN (GLUCOPHAGE) 500 MG tablet Take 1,000 mg by mouth every evening.    Yes Historical Provider, MD  montelukast (SINGULAIR) 10 MG tablet Take 10 mg by mouth at bedtime.   Yes Historical Provider, MD  omeprazole (PRILOSEC) 40 MG capsule Take 40 mg by mouth daily.  03/04/13  Yes Historical Provider, MD  rosuvastatin (CRESTOR) 10 MG tablet Take 10 mg by mouth daily.   Yes Historical Provider, MD  sertraline (ZOLOFT) 50 MG tablet Take 100 mg by mouth daily.    Yes Historical Provider, MD   BP 142/97 mmHg  Pulse 85  Temp(Src) 98.8 F (37.1 C) (Oral)  Resp 14  SpO2 96%   Physical Exam  Constitutional: She is oriented to person, place, and time. She appears well-developed and well-nourished.  HENT:  Head: Normocephalic and atraumatic.  Eyes: Conjunctivae are normal. Pupils are equal, round, and reactive to light. Right eye exhibits no discharge. Left eye exhibits no discharge. No scleral icterus.  Neck: Normal range of motion. No JVD present. No tracheal deviation present.  Pulmonary/Chest: Effort normal. No stridor.  Abdominal: Soft. She exhibits no distension and no mass. There is tenderness. There is no rebound and no guarding.  Very minor tenderness to palpation of the right upper/ lateral abdomen, nonsurgical abdomen  Genitourinary: Rectal exam shows no external hemorrhoid, no internal hemorrhoid and no tenderness. Guaiac positive stool.  Neurological: She is alert and oriented to person, place, and time. Coordination normal.  Psychiatric: She has a normal mood and affect. Her behavior is normal. Judgment and thought content normal.  Nursing note and vitals reviewed.     ED Course  Procedures (including critical care time) Labs Review Labs Reviewed  COMPREHENSIVE METABOLIC PANEL - Abnormal; Notable  for the following:    Chloride 98 (*)    CO2 33 (*)    Glucose, Bld 157 (*)    Calcium 8.5 (*)    Albumin 3.2 (*)    All other components within normal limits  CBC - Abnormal; Notable for the following:    WBC 3.4 (*)    RBC 3.03 (*)    Hemoglobin 6.0 (*)    HCT 22.5 (*)    MCV 74.3 (*)    MCH 19.8 (*)    MCHC 26.7 (*)    RDW 18.4 (*)    All other components within normal limits  POC OCCULT BLOOD, ED - Abnormal; Notable for the following:    Fecal Occult Bld POSITIVE (*)    All other components within normal limits  PROTIME-INR  TYPE AND SCREEN  ABO/RH  PREPARE RBC (CROSSMATCH)    Imaging Review No results found. I have personally reviewed and evaluated these images and lab results as part of my medical decision-making.   EKG Interpretation   Date/Time:  Wednesday July 29 2015 17:16:04 EST Ventricular Rate:  86 PR Interval:  168 QRS Duration: 86 QT Interval:  401 QTC Calculation: 480 R Axis:   -179 Text Interpretation:  Sinus rhythm Probable left atrial enlargement Right  axis deviation Abnormal R-wave progression, late transition Artifact in  lead(s) I III aVL and baseline wander in lead(s) V5 Artifact Confirmed by  Manus Gunning  MD, STEPHEN [id number]) on 07/29/2015 5:20:34 PM      MDM   Final diagnoses:  Anemia, unspecified anemia type  Gastrointestinal hemorrhage, unspecified gastritis, unspecified gastrointestinal hemorrhage type    Labs: PT INR, point care call blood, CMP and CBC, type and screen- hemoglobin 6.0, fecal occult blood positive  Imaging:  Consults: Gastroenterology Dr. Loreta Ave  Therapeutics:  Discharge Meds:   Assessment/Plan: 50 year old female presents today with rectal bleeding. Patient has a history of rectal bleeds, has had intermittent bleeding for the last 2 years, worse over the last 2 weeks. She has symptomatic anemia, with a hemoglobin of 6.0. She has no other sources of bleeding, no abnormal bruising, normal PTT and INR. Patient had  no frank blood on physical exam no signs of active bleeding. Patient will be admitted to the internal medicine service. I spoke with Dr. Loreta Ave of gastroenterology who would see the patient tomorrow. She remained stable here in the ED        Eyvonne Mechanic, PA-C 07/29/15 2014  Glynn Octave, MD 07/30/15 [phone]

## 2015-07-29 NOTE — H&P (Signed)
Date: 07/29/2015               Patient Name:  Shelley [name] MRN: [id number]  DOB: May 01, 1966 Age / Sex: 50 y.o., female   PCP: Maurice Small, MD         Medical Service: Internal Medicine Teaching Service         Attending Physician: Dr. Tyson Alias, MD    First Contact: Justus Memory MS-IV Pager: [phone]  Second Contact: Dr. Allena Katz  Pager: [phone]       After Hours (After 5p/  First Contact Pager: [phone]  weekends / holidays): Second Contact Pager: [phone]   Chief Complaint: weakness, blood per rectum   History of Present Illness: Patient is a 50 yo F with a PMHx of hemmorhoids, cecal AVM, diverticulosis, asthma, OSA, chronic respiratory failure 2/2 restrictive lung disease on home oxygen and chronic nocturnal Bipap, morbid obesity, anxiety, mental retardation presenting to the hospital with a chief complaint of weakness. Patient was accompanied by her caregiver who provided the history. Caregiver states patient has been losing blood per rectum for the past 2 years. States this has been happening intermittently and she notices massive amounts of bright red blood in the toilet and around it as if is was "sprayed." Also states patient has been having dark stools on occasion and chronic loose stools which are green, foul smelling. Patient reports having rectal pain with defecation. However, denies having any abdominal pain. She is currently taking Omeprazole 40 mg daily but does not use any medication for hemorrhoids. Denies any NSAID use. Denies taking any blood thinners. Caregiver states patient has chronic loose stools but no recent antibiotic use. States patient went to her primary care physician about a month ago and was told her hemoglobin was 9.3 at the time. As per caregiver, patient has been taking iron tablets every day. States patient has been feeling tired, dizzy, and falling for the past 1-2 weeks. Patient denies having any injuries from those falls. Caregiver states  patient is chronically short of breath and currently on 4 L home oxygen but does not use her BiPAP machine at night. Patient denies having any shortness of breath is present. No other complaints.  PCP is Dr. Maurice Small in [facility].  As per [facility] records, patient has an EGD and colonoscopy done back in August 2014. EGD at the time showing hiatal hernia but no source of anemia was identified. No evidence of ulceration, erosion, masses, or vascular abnormalities noted. Colonoscopy showing nonbleeding cecal AVM status post APC, scattered diverticula, internal and external hemorrhoids. Hgb checked upon admission today was 6.0 and FOBT+. Patient was transfused with 2 units of packed red blood cells in the emergency room.  Meds: Current Facility-Administered Medications  Medication Dose Route Frequency Provider Last Rate Last Dose  . 0.9 %  sodium chloride infusion   Intravenous Continuous Marjan Rabbani, MD      . acetaminophen (TYLENOL) tablet 650 mg  650 mg Oral Q6H PRN Otis Brace, MD       Or  . acetaminophen (TYLENOL) suppository 650 mg  650 mg Rectal Q6H PRN Marjan Rabbani, MD      . albuterol (PROVENTIL) (2.5 MG/3ML) 0.083% nebulizer solution 2.5 mg  2.5 mg Nebulization Q6H PRN Marjan Rabbani, MD      . budesonide (PULMICORT) nebulizer solution 0.25 mg  0.25 mg Nebulization BID Marjan Rabbani, MD      . ondansetron (ZOFRAN) tablet 4 mg  4 mg Oral  Q6H PRN Otis Brace, MD       Or  . ondansetron (ZOFRAN) injection 4 mg  4 mg Intravenous Q6H PRN Marjan Rabbani, MD      . pantoprazole (PROTONIX) injection 40 mg  40 mg Intravenous Q24H Marjan Rabbani, MD      . sertraline (ZOLOFT) tablet 100 mg  100 mg Oral Daily Marjan Rabbani, MD      . sodium chloride flush (NS) 0.9 % injection 3 mL  3 mL Intravenous Q12H Marjan Rabbani, MD   3 mL at 07/29/15 2048  . witch hazel-glycerin (TUCKS) pad   Topical PRN Otis Brace, MD       Current Outpatient Prescriptions  Medication Sig  Dispense Refill  . albuterol (PROVENTIL) (2.5 MG/3ML) 0.083% nebulizer solution Take 3 mLs (2.5 mg total) by nebulization every 6 (six) hours as needed for wheezing or shortness of breath. 120 vial 5  . budesonide (PULMICORT) 0.25 MG/2ML nebulizer solution INHALE 1 VIAL VIA NEBULIZER TWICE DAILY. 180 mL 3  . fenofibrate 160 MG tablet Take 160 mg by mouth daily.    Marland Kitchen FERREX 150 150 MG capsule Take 150 mg by mouth daily.  6  . loratadine (CLARITIN) 10 MG tablet Take 10 mg by mouth daily as needed. For allergies    . losartan-hydrochlorothiazide (HYZAAR) 100-25 MG per tablet Take 1 tablet by mouth daily.    . metFORMIN (GLUCOPHAGE) 500 MG tablet Take 1,000 mg by mouth every evening.     . montelukast (SINGULAIR) 10 MG tablet Take 10 mg by mouth at bedtime.    Marland Kitchen omeprazole (PRILOSEC) 40 MG capsule Take 40 mg by mouth daily.     . rosuvastatin (CRESTOR) 10 MG tablet Take 10 mg by mouth daily.    . sertraline (ZOLOFT) 50 MG tablet Take 100 mg by mouth daily.       Allergies: Allergies as of 07/29/2015 - Review Complete 07/29/2015  Allergen Reaction Noted  . Fish allergy Swelling 11/29/2011  . Pepto-bismol [bismuth subsalicylate] Nausea And Vomiting 11/29/2011  . Cortisone Itching and Rash   . Trichophyton Itching 07/29/2015   Past Medical History  Diagnosis Date  . Asthma   . Diabetes mellitus   . Hypertension   . Hyperlipidemia   . Pneumonia   . Anxiety   . GERD (gastroesophageal reflux disease)   . Arthritis   . Mental retardation   . Hiatal hernia   . AVM (arteriovenous malformation) of colon   . Hemorrhoids     Internal and external  . Diverticulosis    Past Surgical History  Procedure Laterality Date  . Cholecystectomy      2006  . Eye surgery    . Colonoscopy N/A 02/06/2013    Procedure: COLONOSCOPY;  Surgeon: Theda Belfast, MD;  Location: [facility] ENDOSCOPY;  Service: Endoscopy;  Laterality: N/A;  . Esophagogastroduodenoscopy N/A 02/06/2013    Procedure:  ESOPHAGOGASTRODUODENOSCOPY (EGD);  Surgeon: Theda Belfast, MD;  Location: [facility];  Service: Endoscopy;  Laterality: N/A;   Family History  Problem Relation Age of Onset  . COPD Mother    Social History   Social History  . Marital Status: Single    Spouse Name: N/A  . Number of Children: N/A  . Years of Education: N/A   Occupational History  . Not on file.   Social History Main Topics  . Smoking status: Never Smoker   . Smokeless tobacco: Never Used  . Alcohol Use: No  . Drug Use: No  .  Sexual Activity: Not on file   Other Topics Concern  . Not on file   Social History Narrative   Lives at [facility] independent living facility along with her "aunt" [name] (actually just a friend) who was originally her HCPOA. "Cousin"/friend [name] has now taken over HCPOA  as aunt with Dementia.       Patient is DNR/DNI    PCP Dr. Duanne Guess on [address] in [place].                 Review of Systems: Review of Systems  Constitutional: Positive for malaise/fatigue.  HENT: Negative for ear pain.   Eyes: Negative for pain and discharge.  Respiratory: Negative for cough, shortness of breath and wheezing.   Cardiovascular: Negative for chest pain, palpitations and leg swelling.  Gastrointestinal: Positive for diarrhea, blood in stool and melena. Negative for nausea, vomiting and abdominal pain.  Musculoskeletal: Positive for falls. Negative for neck pain.  Skin: Negative for rash.  Neurological: Positive for dizziness and weakness. Negative for sensory change, focal weakness and headaches.    Physical Exam: Blood pressure 156/93, pulse 88, temperature 98.8 F (37.1 C), temperature source Oral, resp. rate 20, SpO2 100 %. Physical Exam  Constitutional: She is oriented to person, place, and time. She appears well-developed and well-nourished. No distress.  Obese female. Lying comfortably in hospital bed.   HENT:  Head: Normocephalic and atraumatic.  Mouth/Throat:  Oropharynx is clear and moist.  Eyes: EOM are normal. Pupils are equal, round, and reactive to light. No scleral icterus.  Neck: Neck supple. No tracheal deviation present.  Cardiovascular: Normal rate, regular rhythm and intact distal pulses.  Exam reveals no gallop and no friction rub.   No murmur heard. Pulmonary/Chest: No respiratory distress. She has no wheezes. She has no rales.  Anterior lung fields clear to auscultation bilaterally.   Abdominal: Soft. She exhibits distension. There is no tenderness. There is no rebound and no guarding.  Hyperactive bowel sounds   Musculoskeletal: She exhibits no edema or tenderness.  Neurological: She is alert and oriented to person, place, and time.  Skin: Skin is warm and dry. No rash noted. She is not diaphoretic. There is pallor.  Psychiatric: She has a normal mood and affect.    Lab results: Basic Metabolic Panel:  Recent Labs  16/10/96 1608  NA 142  K 3.8  CL 98*  CO2 33*  GLUCOSE 157*  BUN 8  CREATININE 0.58  CALCIUM 8.5*   Liver Function Tests:  Recent Labs  07/29/15 1608  AST 21  ALT 16  ALKPHOS 70  BILITOT 0.3  PROT 7.1  ALBUMIN 3.2*   CBC:  Recent Labs  07/29/15 1608  WBC 3.4*  HGB 6.0*  HCT 22.5*  MCV 74.3*  PLT 155   Coagulation:  Recent Labs  07/29/15 1852  LABPROT 13.3  INR 0.99   Imaging results:  No results found.  Other results: EKG: No acute ST/T-wave changes noted.   Assessment & Plan by Problem: Principal Problem:   Lower GI bleed Active Problems:   Asthma, persistent controlled   Obstructive sleep apnea   Chronic respiratory failure (HCC)   Iron deficiency anemia due to chronic blood loss   Diabetes mellitus (HCC)   Obesity, Class III, BMI 40-49.9 (morbid obesity) (HCC)   Hemorrhoids   Dependence on supplemental oxygen   Anxiety   Mental retardation   Diverticulosis   History of arteriovenous malformation (AVM)   GERD (gastroesophageal reflux disease)  Chronic  diarrhea  Symptomatic anemia - likely in the setting of chronic lower GI bleed. Caregiver states patient is having both hematochezia and melena for the past 2 years and experienced weakness/ tiredness, dizziness, falls for the past 1-2 weeks. Patient's H/H were 6.0/22.5, MCV 74.3, fecal occult blood test positive on admission. Physical exam remarkable for pallor. EGD in August 2014 did not identify a source of upper GI bleed however, colonoscopy at the time showing nonbleeding cecal AVM status post APC, scattered diverticula, internal and external hemorrhoids. She is currently on omeprazole 40 mg daily but does not use any medication for hemorrhoids. ED physician consulted gastroenterology and they will be seeing the patient in the morning. She is being transfused with 2 units of packed red blood cells in the emergency room.  -Admit to stepdown -Nothing by mouth after midnight -Normal saline at 125 mL an hour -Zofran 4 mg every 6 hours as needed for nausea -Protonix 40 mg IV every 24 hours -Transfuse with packed red blood cells if hemoglobin less than 7 -Tox pads as needed for itching, irritation from hemorrhoids -Hold home medication Ferrex 150 mg daily for now as patient is NPO and receiving PRBCs -Follow-up a.m. labs: CBC, ferritin, aPTT, BMP, mag, phosphorus, HIV antibody -Follow up GI recommendations  Chronic diarrhea - green, foul-smelling as per caregiver. However, no recent antibiotic use. Suspicion for C. difficile colitis is low. Diarrhea likely from chronic metformin use. -Hold home medication metformin for now. Blood glucose 157 on admission.  Chronic respiratory failure secondary to restrictive lung disease - patient is on 4 L home oxygen but not compliant with nocturnal BiPAP as per caregiver. -Oxygen supplementation as needed -BiPAP at night as needed  Asthma -Continue home medication albuterol nebulizer every 6 hours as needed -Continue home medication Pulmicort nebulizer twice  daily  Generalized anxiety disorder  -Continue home medication sertraline 100 mg daily  Hyperlipidemia - no lipid panel on file. Holding all medications for now as patient is nothing by mouth.  -Hold home med fenofibrate 160 mg daily  -Hold home med rosuvastatin 10 mg daily  Hypertension -Hold home medication Hyzaar 100-25 mg daily in the setting of active blood loss   Type 2 diabetes - last A1c on record was 6.1 in August 2014. Blood glucose 157 on admission. -Hold home medication metformin 500 mg daily for now  Diet: NPO  DVT ppx: SCDs  Code: FULL for now but needs to be discussed with patient's HCPOA [name]). Patient has mental retardation and could not comprehend this information.     Dispo: Disposition is deferred at this time, awaiting improvement of current medical problems. Anticipated discharge in approximately  day(s).   The patient does have a current PCP Maurice Small, MD) and does need an [facility] hospital follow-up appointment after discharge.  The patient does not have transportation limitations that hinder transportation to clinic appointments.  Signed: John Giovanni, MD 07/29/2015, 11:57 PM

## 2015-07-29 NOTE — ED Notes (Signed)
Pt ssates she has a hemorrhoid that "bleeds a lot and i need blood transfusions before". Pt states last week it bled a lot "a week ago" and "ive felt weak ever since". Pt in a wheelchair from home and is on 4L o2 due to "pneumonia in my tubes". Pt in NAD, denies pain. Skin pain and dry.

## 2015-07-30 DIAGNOSIS — D62 Acute posthemorrhagic anemia: Secondary | ICD-10-CM

## 2015-07-30 DIAGNOSIS — J45909 Unspecified asthma, uncomplicated: Secondary | ICD-10-CM

## 2015-07-30 DIAGNOSIS — E119 Type 2 diabetes mellitus without complications: Secondary | ICD-10-CM

## 2015-07-30 DIAGNOSIS — J9612 Chronic respiratory failure with hypercapnia: Secondary | ICD-10-CM

## 2015-07-30 DIAGNOSIS — E785 Hyperlipidemia, unspecified: Secondary | ICD-10-CM

## 2015-07-30 DIAGNOSIS — F411 Generalized anxiety disorder: Secondary | ICD-10-CM

## 2015-07-30 DIAGNOSIS — D649 Anemia, unspecified: Secondary | ICD-10-CM | POA: Diagnosis present

## 2015-07-30 DIAGNOSIS — K529 Noninfective gastroenteritis and colitis, unspecified: Secondary | ICD-10-CM

## 2015-07-30 DIAGNOSIS — I10 Essential (primary) hypertension: Secondary | ICD-10-CM

## 2015-07-30 DIAGNOSIS — J984 Other disorders of lung: Secondary | ICD-10-CM

## 2015-07-30 DIAGNOSIS — K922 Gastrointestinal hemorrhage, unspecified: Secondary | ICD-10-CM

## 2015-07-30 LAB — HEMOGLOBIN AND HEMATOCRIT, BLOOD
HCT: 28.3 % — ABNORMAL LOW (ref 36.0–46.0)
Hemoglobin: 7.6 g/dL — ABNORMAL LOW (ref 12.0–15.0)

## 2015-07-30 LAB — CBC
HCT: 27.2 % — ABNORMAL LOW (ref 36.0–46.0)
HEMATOCRIT: 27.6 % — AB (ref 36.0–46.0)
HEMOGLOBIN: 7.6 g/dL — AB (ref 12.0–15.0)
Hemoglobin: 7.6 g/dL — ABNORMAL LOW (ref 12.0–15.0)
MCH: 21.1 pg — AB (ref 26.0–34.0)
MCH: 21.6 pg — AB (ref 26.0–34.0)
MCHC: 27.5 g/dL — AB (ref 30.0–36.0)
MCHC: 27.9 g/dL — ABNORMAL LOW (ref 30.0–36.0)
MCV: 76.5 fL — AB (ref 78.0–100.0)
MCV: 77.3 fL — AB (ref 78.0–100.0)
PLATELETS: 152 10*3/uL (ref 150–400)
Platelets: 161 10*3/uL (ref 150–400)
RBC: 3.61 MIL/uL — ABNORMAL LOW (ref 3.87–5.11)
RBC: 3.52 MIL/uL — AB (ref 3.87–5.11)
RDW: 18.4 % — AB (ref 11.5–15.5)
RDW: 18.6 % — ABNORMAL HIGH (ref 11.5–15.5)
WBC: 2.8 10*3/uL — AB (ref 4.0–10.5)
WBC: 2.7 10*3/uL — AB (ref 4.0–10.5)

## 2015-07-30 LAB — POCT I-STAT 3, ART BLOOD GAS (G3+)
ACID-BASE EXCESS: 13 mmol/L — AB (ref 0.0–2.0)
BICARBONATE: 41.4 meq/L — AB (ref 20.0–24.0)
O2 Saturation: 97 %
PO2 ART: 111 mmHg — AB (ref 80.0–100.0)
TCO2: 44 mmol/L (ref 0–100)
pCO2 arterial: 85.4 mmHg (ref 35.0–45.0)
pH, Arterial: 7.292 — ABNORMAL LOW (ref 7.350–7.450)

## 2015-07-30 LAB — TECHNOLOGIST SMEAR REVIEW

## 2015-07-30 LAB — BASIC METABOLIC PANEL
ANION GAP: 9 (ref 5–15)
BUN: 5 mg/dL — ABNORMAL LOW (ref 6–20)
CALCIUM: 8.5 mg/dL — AB (ref 8.9–10.3)
CO2: 37 mmol/L — AB (ref 22–32)
CREATININE: 0.51 mg/dL (ref 0.44–1.00)
Chloride: 97 mmol/L — ABNORMAL LOW (ref 101–111)
Glucose, Bld: 167 mg/dL — ABNORMAL HIGH (ref 65–99)
Potassium: 3.7 mmol/L (ref 3.5–5.1)
SODIUM: 143 mmol/L (ref 135–145)

## 2015-07-30 LAB — GLUCOSE, CAPILLARY
GLUCOSE-CAPILLARY: 149 mg/dL — AB (ref 65–99)
GLUCOSE-CAPILLARY: 175 mg/dL — AB (ref 65–99)
GLUCOSE-CAPILLARY: 112 mg/dL — AB (ref 65–99)
GLUCOSE-CAPILLARY: 91 mg/dL (ref 65–99)
Glucose-Capillary: 144 mg/dL — ABNORMAL HIGH (ref 65–99)
Glucose-Capillary: 160 mg/dL — ABNORMAL HIGH (ref 65–99)

## 2015-07-30 LAB — APTT
aPTT: 189 seconds — ABNORMAL HIGH (ref 24–37)
aPTT: 23 seconds — ABNORMAL LOW (ref 24–37)

## 2015-07-30 LAB — FERRITIN: Ferritin: 7 ng/mL — ABNORMAL LOW (ref 11–307)

## 2015-07-30 LAB — TSH: TSH: 0.7 u[IU]/mL (ref 0.350–4.500)

## 2015-07-30 LAB — MRSA PCR SCREENING: MRSA by PCR: NEGATIVE

## 2015-07-30 MED ORDER — INSULIN ASPART 100 UNIT/ML ~~LOC~~ SOLN
0.0000 [IU] | Freq: Three times a day (TID) | SUBCUTANEOUS | Status: DC
  Administered 2015-07-30 (×2): 2 [IU] via SUBCUTANEOUS

## 2015-07-30 MED ORDER — CETYLPYRIDINIUM CHLORIDE 0.05 % MT LIQD
7.0000 mL | Freq: Two times a day (BID) | OROMUCOSAL | Status: DC
  Administered 2015-07-30 – 2015-08-01 (×5): 7 mL via OROMUCOSAL

## 2015-07-30 MED ORDER — SODIUM CHLORIDE 0.9 % IV SOLN
INTRAVENOUS | Status: DC
  Administered 2015-07-30: 20:00:00 via INTRAVENOUS
  Administered 2015-07-31: 500 mL via INTRAVENOUS

## 2015-07-30 MED ORDER — MONTELUKAST SODIUM 10 MG PO TABS
10.0000 mg | ORAL_TABLET | Freq: Every day | ORAL | Status: DC
  Administered 2015-07-30 – 2015-07-31 (×2): 10 mg via ORAL
  Filled 2015-07-30 (×2): qty 1

## 2015-07-30 MED ORDER — SODIUM CHLORIDE 0.9 % IV SOLN
510.0000 mg | Freq: Once | INTRAVENOUS | Status: AC
  Administered 2015-07-30: 510 mg via INTRAVENOUS
  Filled 2015-07-30: qty 17

## 2015-07-30 MED ORDER — PEG 3350-KCL-NA BICARB-NACL 420 G PO SOLR
4000.0000 mL | Freq: Once | ORAL | Status: AC
  Administered 2015-07-30: 4000 mL via ORAL
  Filled 2015-07-30: qty 4000

## 2015-07-30 NOTE — Evaluation (Signed)
Physical Therapy Evaluation Patient Details Name: Shelley [name] MRN: [id number] DOB: May 25, 1966 Today's Date: 07/30/2015   History of Present Illness  Patient is a 50 yo F with a PMHx of hemmorhoids, cecal AVM, diverticulosis, asthma, OSA, chronic respiratory failure 2/2 restrictive lung disease on home oxygen and chronic nocturnal Bipap, morbid obesity, anxiety, mental retardation presenting to the hospital with a chief complaint of weakness. Patient was accompanied by her caregiver who provided the history. Caregiver states patient has been losing blood per rectum for the past 2 years. States this has been happening intermittently and she notices massive amounts of bright red blood in the toilet and around it as if is was "sprayed." Also states patient has been having dark stools on occasion and chronic loose stools which are green, foul smelling. Patient reports having rectal pain with defecation. However, denies having any abdominal pain. She is currently taking Omeprazole 40 mg daily but does not use any medication for hemorrhoids. Denies any NSAID use. Denies taking any blood thinners. Caregiver states patient has chronic loose stools but no recent antibiotic use. States patient went to her primary care physician about a month ago and was told her hemoglobin was 9.3 at the time. As per caregiver, patient has been taking iron tablets every day. States patient has been feeling tired, dizzy, and falling for the past 1-2 weeks. Patient denies having any injuries from those falls. Caregiver states patient is chronically short of breath and currently on 4 L home oxygen but does not use her BiPAP machine at night. Patient denies having any shortness of breath is present. No other complaints.     Clinical Impression  Pt admitted with above diagnosis. Pt presents with mild functional limitations due to decreased pulmonary status, decreased balance, decreased strength, and decreased endurance. Pt did well with  therapy today just limited due to SOB. Pt would benefit from skilled PT services to be able to return to assisted living but would also benefit from additional home health PT to continue to improve functional mobility and decrease fall risk.     Follow Up Recommendations Home health PT;Supervision - Intermittent    Equipment Recommendations  None recommended by PT    Recommendations for Other Services       Precautions / Restrictions Precautions Precautions: Fall Restrictions Weight Bearing Restrictions: No      Mobility  Bed Mobility Overal bed mobility: Needs Assistance Bed Mobility: Supine to Sit     Supine to sit: Min assist     General bed mobility comments: Needed min A to bring trunk upright but able to scoot to the EOB independently.   Transfers Overall transfer level: Needs assistance Equipment used: Rolling walker (2 wheeled) Transfers: Sit to/from Stand Sit to Stand: Min guard         General transfer comment: Min Guard for slight instability upon standing.   Ambulation/Gait Ambulation/Gait assistance: Min guard Ambulation Distance (Feet): 60 Feet (30+30 with sitting rest break) Assistive device: Rolling walker (2 wheeled) Gait Pattern/deviations: Step-through pattern;Decreased stride length;Trunk flexed;Narrow base of support Gait velocity: decreased Gait velocity interpretation: Below normal speed for age/gender General Gait Details: Stable with RW, but fatigues easily.   Stairs            Wheelchair Mobility    Modified Rankin (Stroke Patients Only)       Balance Overall balance assessment: Needs assistance;History of Falls Sitting-balance support: Bilateral upper extremity supported;Feet supported Sitting balance-Leahy Scale: Good     Standing balance  support: Bilateral upper extremity supported Standing balance-Leahy Scale: Fair Standing balance comment: Slight instability without use of RW.                               Pertinent Vitals/Pain Pain Assessment: No/denies pain  Vitals stable throughout activity. Pt wears 4L [place] O2 at all times at home. RR up to 39 with walking requiring sitting rest break.     Home Living Family/patient expects to be discharged to:: Assisted living. Pt has to walk approximately 300 ft to get to the dining room at her facility.                Home Equipment: Walker - 4 wheels;Shower seat;Grab bars - toilet;Grab bars - tub/shower Additional Comments: Pt has caregiver come 2-3 hours in the morning to help her with ADL's and goes to the dining room for meals.     Prior Function Level of Independence: Needs assistance   Gait / Transfers Assistance Needed: Modified independent with RW  ADL's / Homemaking Assistance Needed: Moderate assistance  Comments: Mental Retardation, no problems with communication. Likes things explained.      Hand Dominance   Dominant Hand: Right    Extremity/Trunk Assessment   Upper Extremity Assessment: Overall WFL for tasks assessed           Lower Extremity Assessment: Generalized weakness      Cervical / Trunk Assessment: Kyphotic  Communication   Communication: No difficulties  Cognition Arousal/Alertness: Awake/alert Behavior During Therapy: Anxious;WFL for tasks assessed/performed Overall Cognitive Status: History of cognitive impairments - at baseline                      General Comments General comments (skin integrity, edema, etc.): Pt very cooperative with therapy. Pt startles very easily with noises and alarms going off and occasionally needs assurance that she is okay. She likes for things to be explained to her and learns best with demonstration.     Exercises Other Exercises Other Exercises: Sit to stand from chari X 10 reps with MinGuard for safety.       Assessment/Plan    PT Assessment Patient needs continued PT services  PT Diagnosis Difficulty walking;Abnormality of gait;Generalized  weakness   PT Problem List Decreased strength;Decreased activity tolerance;Decreased balance;Decreased coordination;Cardiopulmonary status limiting activity;Obesity  PT Treatment Interventions Gait training;Functional mobility training;Therapeutic activities;Therapeutic exercise;Balance training;Patient/family education   PT Goals (Current goals can be found in the Care Plan section) Acute Rehab PT Goals Patient Stated Goal: To go home.  PT Goal Formulation: With patient Time For Goal Achievement: 08/13/15 Potential to Achieve Goals: Good    Frequency Min 3X/week   Barriers to discharge        Co-evaluation               End of Session Equipment Utilized During Treatment: Gait belt;Oxygen Activity Tolerance: Patient tolerated treatment well Patient left: in chair;with call bell/phone within reach;with chair alarm set Nurse Communication: Mobility status         Time: 1610-9604 PT Time Calculation (min) (ACUTE ONLY): 32 min   Charges:   PT Evaluation $PT Eval Moderate Complexity: 1 Procedure PT Treatments $Gait Training: 8-22 mins   PT G Codes:       Everlean Cherry, SPT Everlean Cherry 07/30/2015, 2:41 PM

## 2015-07-30 NOTE — Progress Notes (Signed)
Pt sleeping when attempted to admin scheduled neb tx. Will check back to give neb. RT will continue to monitor.

## 2015-07-30 NOTE — Progress Notes (Signed)
Subjective: Ms. Engebretsen was seen and examined this AM.  She says she is feeling a little better since admission. Breathing well on 3L. Last BM was yesterday.  No diarrhea today.     Objective: Vital signs in last 24 hours: Filed Vitals:   07/30/15 0230 07/30/15 0300 07/30/15 0400 07/30/15 0800  BP: 125/74 108/63 142/79 145/87  Pulse: 87 92 86 82  Temp:  98.6 F (37 C)  98.7 F (37.1 C)  TempSrc:  Oral  Oral  Resp: Height:      Weight:      SpO2: 98% 96% 100% 95%   Weight change:   Intake/Output Summary (Last 24 hours) at 07/30/15 1009 Last data filed at 07/30/15 0700  Gross per 24 hour  Intake 1210.84 ml  Output    450 ml  Net 760.84 ml   General: resting in bed in NAD HEENT: [place]/AT Cardiac: RRR, no rubs, murmurs or gallops Pulm: clear to auscultation bilaterally, moving normal volumes of air Abd: soft, nontender, nondistended, BS present Ext: warm and well perfused, no pedal edema Neuro: alert and oriented, responding appropriately, moving extremities spontaneously  Lab Results: Basic Metabolic Panel:  Recent Labs Lab 07/29/15 1608 07/29/15 2314 07/30/15 0420  NA 142  --  143  K 3.8  --  3.7  CL 98*  --  97*  CO2 33*  --  37*  GLUCOSE 157*  --  167*  BUN 8  --  5*  CREATININE 0.58  --  0.51  CALCIUM 8.5*  --  8.5*  MG  --  1.8  --   PHOS  --  3.4  --    CBC:  Recent Labs Lab 07/29/15 1608 07/30/15 0420  WBC 3.4* 2.8*  HGB 6.0* 7.6*  HCT 22.5* 27.6*  MCV 74.3* 76.5*  PLT 155 161   CBG:  Recent Labs Lab 07/30/15 0818  GLUCAP 144*   Medications: I have reviewed the patient's current medications. Scheduled Meds: . antiseptic oral rinse  7 mL Mouth Rinse BID  . budesonide  0.25 mg Nebulization BID  . pantoprazole (PROTONIX) IV  40 mg Intravenous Q24H  . sertraline  100 mg Oral Daily  . sodium chloride flush  3 mL Intravenous Q12H   Continuous Infusions:  PRN Meds:.acetaminophen **OR** acetaminophen, albuterol, ondansetron  **OR** ondansetron (ZOFRAN) IV, witch hazel-glycerin Assessment/Plan: 50 year old woman with PMH of developmental delay, asthma, anxiety, DM, diverticulosis, hemorrhoids, AVM and OSA here with symptomatic anemia.  Symptomatic anemia 2/2 to LGIB:  Symptoms improving s/p 2 units PRBC and Feraheme .  Post- transfusion H/H 7.6/27.6.   - GI has been consulted and recommendations are appreciated; question inpatient vs outpatient work-up since no active bleeding - H&H intermittently (q8h for now); transfuse PRBCs for Hgb < 7  LGIB:  She reports a chronic hx of intermittent BRBPR that has increased in recent days.  She last bled 3 days ago.  + FOBT with known hemorrhoid, diverticula and cecal AVM (s/p APC during 2014 colonoscopy by Dr. Elnoria Howard).  At that time, recommendations were for hemorrhoidal banding if she had persistent hematochezia.  - see plan above  Chronic respiratory failure 2/2 OHS/OSA:  On BiPAP (w/ 4L when she sleeps) at home.  Refused last night.  Awake and alert this AM (around 7:30AM) but we were paged by RN around 10:30AM that she was less arousable.  Went to check on her again.  She was asleep, snoring and difficult  to arouse, VSS on 3L, blood sugar 160, MAR reviewed and no inciting meds.  After about 10 minutes she did awake and seemed back to baseline that we had seen earlier in the morning.  She was oriented x 3, following commands, moving spontaneously, no focal abnormalities.   - continue supplemental oxygen - BiPAP prn while sleeping w/ 4L oxygen - ABG  Asthma:  Continue Pulmicort, Singulair, nebs.  Diet:  NPO in case of GI intervention, then carb mod VTE ppx:  SCDs, no pharmacologic VTE in the setting of GI bleed Code status:  Full  Dispo: Disposition is deferred at this time, awaiting improvement of current medical problems.  Anticipated discharge in approximately 1-2 day(s).   The patient does have a current PCP Maurice Small, MD) and does not need an [facility] hospital  follow-up appointment after discharge.  The patient does not have transportation limitations that hinder transportation to clinic appointments.  .Services Needed at time of discharge: Y = Yes, Blank = No PT:   OT:   RN:   Equipment:   Other:     LOS: 1 day   Yolanda Manges, DO 07/30/2015, 10:09 AM

## 2015-07-30 NOTE — Progress Notes (Signed)
Utilization Review Completed.Atiyana Welte T2/16/2017  

## 2015-07-30 NOTE — Progress Notes (Signed)
Placed patient on BIPAP per Md due to increased CO2 on ABG. Will cont to monitor   07/30/15 1100  BiPAP/CPAP/SIPAP  BiPAP/CPAP/SIPAP Pt Type Adult  Mask Type Full face mask  Mask Size Large  Set Rate 10 breaths/min  Respiratory Rate 16 breaths/min  IPAP 10 cmH20  EPAP 5 cmH2O  Oxygen Percent 40 %  Minute Ventilation 7.2  Leak 23  Peak Inspiratory Pressure (PIP) 11  Tidal Volume (Vt) 463  BiPAP/CPAP/SIPAP BiPAP  Patient Home Equipment No  Auto Titrate No  Press High Alarm 25 cmH2O  Press Low Alarm 5 cmH2O  BiPAP/CPAP /SiPAP Vitals  Pulse Rate 86  Resp 16  SpO2 100 %

## 2015-07-30 NOTE — Progress Notes (Signed)
Subjective: Patient reports that the last time she had a bloody bowel movement about 3 days ago.  She reports that the bloody bowel movements are intermittent and is unable to quantify how long they last when present.  No bowel movement yet today.    Per EMR, patient refused bipap last night.   Objective: Vital signs in last 24 hours: Filed Vitals:   07/30/15 0230 07/30/15 0300 07/30/15 0400 07/30/15 0800  BP: 125/74 108/63 142/79 145/87  Pulse: 87 92 86 82  Temp:  98.6 F (37 C)  98.7 F (37.1 C)  TempSrc:  Oral  Oral  Resp: 17 17 18 21   Height:      Weight:      SpO2: 98% 96% 100% 95%   Weight change:   Intake/Output Summary (Last 24 hours) at 07/30/15 1114 Last data filed at 07/30/15 0700  Gross per 24 hour  Intake 1210.84 ml  Output    450 ml  Net 760.84 ml   General Appearance: Obese female, lying in hospital bed, pale appearing, no acute distress Head: Normocephalic, without obvious abnormality, atraumatic Eyes:PERRL, conjunctiva/corneas clear, EOM's intact Nose: Nares normal, septum midline, mucosa normal, no drainage  Neck: Symmetrical, trachea midline,  Back: Symmetric, no curvature Lungs: Clear to auscultation bilaterally, respirations unlabored Chest Wall: No tenderness or deformity Heart: Regular rate and rhythm, S1 and S2 normal, no murmur, rub   or gallop Abdomen: Soft, non-tender, bowel sounds active all four quadrants,no masses, no organomegaly Extremities: Extremities normal, atraumatic, no cyanosis or edema Pulses: 2+ and symmetric in all extremitie Neuro:alert, moves all extremities equally  Lab Results: Basic Metabolic Panel:  Recent Labs  54/09/81 1608 07/29/15 2314 07/30/15 0420  NA 142  --  143  K 3.8  --  3.7  CL 98*  --  97*  CO2 33*  --  37*  GLUCOSE 157*  --  167*  BUN 8  --  5*  CREATININE 0.58  --  0.51  CALCIUM 8.5*  --  8.5*  MG  --  1.8  --   PHOS  --  3.4  --    Liver Function Tests:  Recent Labs  07/29/15 1608  AST  21  ALT 16  ALKPHOS 70  BILITOT 0.3  PROT 7.1  ALBUMIN 3.2*   CBC:  Recent Labs  07/30/15 0420 07/30/15 1102  WBC 2.8* 2.7*  HGB 7.6* 7.6*  HCT 27.6* 27.2*  MCV 76.5* 77.3*  PLT 161 152   CBG:  Recent Labs  07/30/15 0818  GLUCAP 144*   Thyroid Function Tests:  Recent Labs  07/29/15 2315  TSH 0.700   Anemia Panel:  Recent Labs  07/29/15 2315  FERRITIN 7*   Coagulation:  Recent Labs  07/29/15 1852  LABPROT 13.3  INR 0.99    Micro Results: Recent Results (from the past 240 hour(s))  MRSA PCR Screening     Status: None   Collection Time: 07/30/15  2:00 AM  Result Value Ref Range Status   MRSA by PCR NEGATIVE NEGATIVE Final    Comment:        The GeneXpert MRSA Assay (FDA approved for NASAL specimens only), is one component of a comprehensive MRSA colonization surveillance program. It is not intended to diagnose MRSA infection nor to guide or monitor treatment for MRSA infections.    Studies/Results: No results found. Medications: Reviewed  Scheduled Meds: . antiseptic oral rinse  7 mL Mouth Rinse BID  . budesonide  0.25 mg  Nebulization BID  . insulin aspart  0-9 Units Subcutaneous TID WC  . montelukast  10 mg Oral QHS  . pantoprazole (PROTONIX) IV  40 mg Intravenous Q24H  . sertraline  100 mg Oral Daily  . sodium chloride flush  3 mL Intravenous Q12H   Continuous Infusions:  PRN Meds:.acetaminophen **OR** acetaminophen, albuterol, ondansetron **OR** ondansetron (ZOFRAN) IV, witch hazel-glycerin Assessment/Plan: Principal Problem:   Symptomatic anemia Active Problems:   Asthma, persistent controlled   Developmental delay   Obstructive sleep apnea   Chronic respiratory failure (HCC)   Iron deficiency anemia due to chronic blood loss   Diabetes mellitus (HCC)   Obesity, Class III, BMI 40-49.9 (morbid obesity) (HCC)   Hemorrhoids   Dependence on supplemental oxygen   Anxiety   Mental retardation   Lower GI bleed    Diverticulosis   History of arteriovenous malformation (AVM)   GERD (gastroesophageal reflux disease)   Chronic diarrhea  Shelley [name] is a 50 year old woman with history of diverticulosis, internal and external hemorrhoids, cecal AVM, GERD,DM, asthma, HTN, HLD, anxiety, mental retardation who presented with complaints of fatigue, dizziness, and falling in the setting of intermittent bright red blood per rectum and melena who was found to have hgb of 6.0 with low MCV and elevated RDW on initial presentation.   Symptomatic Anemia:  This is likely secondary to chronic intermittent lower GI bleed over the last 2 years.  CBC results and ferritin of 7 consistent with iron deficiency anemia.  Total iron deficit (Dose = 0.3 *weight * (100 - (hgb* 100) / AgeFactor) is 4.59 g. Patient was given 2 U pRBC and feraheme 510 mg which leaves her with a deficit of about 3.5  G.  Patient takes ferrex 150 mg daily at home.  - Continue to monitor hgb Q8H; transfusion with pRBC for hgb <7  Chronic Lower GI Bleed: Reported history of BRBPR and melena intermittently over the last 2 years.  Her last bloody bowel movement was about 3 days ago according to the patient.  FOBT+ on initial presentation. Known to have cecal AVM s/p APC, internal and external hemorrhoid, diverticula from colonoscopy in 2014.  At that time, hemmoroidal banding was suggested if bleeding continued. - GI consulted, appreciate recommendations re inpatient versus outpatient work up and management  Chronic Respiratory Failure 2/2 OHS/OSA: Patient wears BiPAP at night at home. Patient refused to wear overnight.  See recent interval events detailed below.  - Continue supplemental oxygen by [place] while awake - BiPAP available as needed while sleeping  FENGI/Ppx:   Fluids: NS @ 125 ml/hr  Electrolytes:   Nutrition:  GI: pantoprazole 40 mg IV, ondansetron 4 mg Q6H prn  Ppx: SCDs, pharmacologic VTE ppx contraindicated in setting of active GI bleed  Dispo:  Continue step-down status given interval event of difficultly to arouse    ______________________________________________________________________  INTERVAL EVENT: 1041: Paged by [place], RN that Shelley [name] was difficult to arouse.  RN requesting bipap since patient refused it last night.  Went with Dr. Andrey Campanile to evaluate pt.  Patient was snoring and difficult to arouse (initially did not wake even to sternal rub).  Upon arousal, patient oriented to person, place, and time (she did have difficulty with the year initially, possibly baseline given MR). CBG 160s. Patient smelled of urine, but has not history of seizures. Wears diapers at home and likely incontinent at baseline.  No medications had recently been given.  RT called for ABG. Patient able to carry on  conversation by the time we left her room.  1126: [place], RN called with ABG results- pH 7/288, CO2 86, bicarb 41, PO2 113 which is consistent with chronic respiratory acidosis.  Bipap ordered for intermittent use to support patient while napping/sleeping during the day.      This is a Psychologist, occupational Note.  The care of the patient was discussed with Dr. Andrey Campanile and the assessment and plan formulated with their assistance.  Please see their attached note for official documentation of the daily encounter.   LOS: 1 day   Lacie Scotts, Med Student 07/30/2015, 11:14 AM

## 2015-07-30 NOTE — Progress Notes (Signed)
Per RN. RN stated on admission that pt stated that she was too tired to wear the NIV for the night. Pt refuse for the night. RT will monitor

## 2015-07-30 NOTE — Consult Note (Signed)
Reason for Consult: Symptomatic anemia and GI bleed Referring Physician: Teaching Service  Shelley [name] HPI: This is a 50 year old female with a PMH of cecal AVMs, diverticula, and hematochezia.  She was admitted to the hospital as she was symptomatic from her anemia.  Her admission HGB was at 6.0 g/dL and her HGB one month ago in her PCP's office was at 9.3 g/dL.  She reports dizzy and fatigued for the past couple of weeks.  No issues with SOB, but she has OSA.  The patient underwent a colonoscopy in 2011 for complaints of hematochezia with findings of diverticula, cecal AVMs, and hemorrhoids.  She represented in 2014 with hematochezia and then it was felt that she would benefit from ablation of her cecal AVMs as a potential source of bleeding.  One out of the two AVMs was ablated and the second faded during the colonoscopy and it was not able to be re-identified.  During the interval time period she complains of hematochezia and some darker stools.  She was evaluated by Surgery for her hemorrhoids, but only conservative management was pursued.  Past Medical History  Diagnosis Date  . Asthma   . Diabetes mellitus   . Hypertension   . Hyperlipidemia   . Pneumonia   . Anxiety   . GERD (gastroesophageal reflux disease)   . Arthritis   . Mental retardation   . Hiatal hernia   . AVM (arteriovenous malformation) of colon   . Hemorrhoids     Internal and external  . Diverticulosis     Past Surgical History  Procedure Laterality Date  . Cholecystectomy      2006  . Eye surgery    . Colonoscopy N/A 02/06/2013    Procedure: COLONOSCOPY;  Surgeon: Theda Belfast, MD;  Location: [facility]OSCOPY;  Service: Endoscopy;  Laterality: N/A;  . Esophagogastroduodenoscopy N/A 02/06/2013    Procedure: ESOPHAGOGASTRODUODENOSCOPY (EGD);  Surgeon: Theda Belfast, MD;  Location: [facility];  Service: Endoscopy;  Laterality: N/A;    Family History  Problem Relation Age of Onset  . COPD Mother     Social  History:  reports that she has never smoked. She has never used smokeless tobacco. She reports that she does not drink alcohol or use illicit drugs.  Allergies:  Allergies  Allergen Reactions  . Fish Allergy Swelling  . Pepto-Bismol [Bismuth Subsalicylate] Nausea And Vomiting  . Cortisone Itching and Rash    Injections and cream only.  PO ok.  . Trichophyton Itching    Also sneezing accompanying as well    Medications:  Scheduled: . antiseptic oral rinse  7 mL Mouth Rinse BID  . budesonide  0.25 mg Nebulization BID  . insulin aspart  0-9 Units Subcutaneous TID WC  . montelukast  10 mg Oral QHS  . pantoprazole (PROTONIX) IV  40 mg Intravenous Q24H  . polyethylene glycol-electrolytes  4,000 mL Oral Once  . sertraline  100 mg Oral Daily  . sodium chloride flush  3 mL Intravenous Q12H   Continuous: . sodium chloride      Results for orders placed or performed during the hospital encounter of 07/29/15 (from the past 24 hour(s))  Comprehensive metabolic panel     Status: Abnormal   Collection Time: 07/29/15  4:08 PM  Result Value Ref Range   Sodium 142 135 - 145 mmol/L   Potassium 3.8 3.5 - 5.1 mmol/L   Chloride 98 (L) 101 - 111 mmol/L   CO2 33 (H) 22 -  32 mmol/L   Glucose, Bld 157 (H) 65 - 99 mg/dL   BUN 8 6 - 20 mg/dL   Creatinine, Ser 6.44 0.44 - 1.00 mg/dL   Calcium 8.5 (L) 8.9 - 10.3 mg/dL   Total Protein 7.1 6.5 - 8.1 g/dL   Albumin 3.2 (L) 3.5 - 5.0 g/dL   AST 21 15 - 41 U/L   ALT 16 14 - 54 U/L   Alkaline Phosphatase 70 38 - 126 U/L   Total Bilirubin 0.3 0.3 - 1.2 mg/dL   GFR calc non Af Amer >60 >60 mL/min   GFR calc Af Amer >60 >60 mL/min   Anion gap 11 5 - 15  CBC     Status: Abnormal   Collection Time: 07/29/15  4:08 PM  Result Value Ref Range   WBC 3.4 (L) 4.0 - 10.5 K/uL   RBC 3.03 (L) 3.87 - 5.11 MIL/uL   Hemoglobin 6.0 (LL) 12.0 - 15.0 g/dL   HCT 03.4 (L) 74.2 - 59.5 %   MCV 74.3 (L) 78.0 - 100.0 fL   MCH 19.8 (L) 26.0 - 34.0 pg   MCHC 26.7 (L) 30.0  - 36.0 g/dL   RDW 63.8 (H) 75.6 - 43.3 %   Platelets 155 150 - 400 K/uL  Type and screen [facility]     Status: None (Preliminary result)   Collection Time: 07/29/15  4:08 PM  Result Value Ref Range   ABO/RH(D) A POS    Antibody Screen NEG    Sample Expiration 08/01/2015    Unit Number [id number]    Blood Component Type RED CELLS,LR    Unit division 00    Status of Unit ISSUED,FINAL    Transfusion Status OK TO TRANSFUSE    Crossmatch Result Compatible    Unit Number [id number]    Blood Component Type RED CELLS,LR    Unit division 00    Status of Unit ISSUED    Transfusion Status OK TO TRANSFUSE    Crossmatch Result Compatible   ABO/Rh     Status: None   Collection Time: 07/29/15  4:08 PM  Result Value Ref Range   ABO/RH(D) A POS   POC occult blood, ED     Status: Abnormal   Collection Time: 07/29/15  5:28 PM  Result Value Ref Range   Fecal Occult Bld POSITIVE (A) NEGATIVE  Prepare RBC     Status: None   Collection Time: 07/29/15  6:00 PM  Result Value Ref Range   Order Confirmation ORDER PROCESSED BY BLOOD BANK   Protime-INR     Status: None   Collection Time: 07/29/15  6:52 PM  Result Value Ref Range   Prothrombin Time 13.3 11.6 - 15.2 seconds   INR 0.99 0.00 - 1.49  Magnesium     Status: None   Collection Time: 07/29/15 11:14 PM  Result Value Ref Range   Magnesium 1.8 1.7 - 2.4 mg/dL  Phosphorus     Status: None   Collection Time: 07/29/15 11:14 PM  Result Value Ref Range   Phosphorus 3.4 2.5 - 4.6 mg/dL  APTT     Status: Abnormal   Collection Time: 07/29/15 11:14 PM  Result Value Ref Range   aPTT 189 (H) 24 - 37 seconds  Technologist smear review     Status: None   Collection Time: 07/29/15 11:14 PM  Result Value Ref Range   Tech Review ELLIPTOCYTES   TSH     Status: None   Collection  Time: 07/29/15 11:15 PM  Result Value Ref Range   TSH 0.700 0.350 - 4.500 uIU/mL  Ferritin     Status: Abnormal   Collection Time: 07/29/15 11:15 PM   Result Value Ref Range   Ferritin 7 (L) 11 - 307 ng/mL  MRSA PCR Screening     Status: None   Collection Time: 07/30/15  2:00 AM  Result Value Ref Range   MRSA by PCR NEGATIVE NEGATIVE  Basic metabolic panel     Status: Abnormal   Collection Time: 07/30/15  4:20 AM  Result Value Ref Range   Sodium 143 135 - 145 mmol/L   Potassium 3.7 3.5 - 5.1 mmol/L   Chloride 97 (L) 101 - 111 mmol/L   CO2 37 (H) 22 - 32 mmol/L   Glucose, Bld 167 (H) 65 - 99 mg/dL   BUN 5 (L) 6 - 20 mg/dL   Creatinine, Ser 1.61 0.44 - 1.00 mg/dL   Calcium 8.5 (L) 8.9 - 10.3 mg/dL   GFR calc non Af Amer >60 >60 mL/min   GFR calc Af Amer >60 >60 mL/min   Anion gap 9 5 - 15  CBC     Status: Abnormal   Collection Time: 07/30/15  4:20 AM  Result Value Ref Range   WBC 2.8 (L) 4.0 - 10.5 K/uL   RBC 3.61 (L) 3.87 - 5.11 MIL/uL   Hemoglobin 7.6 (L) 12.0 - 15.0 g/dL   HCT 09.6 (L) 04.5 - 40.9 %   MCV 76.5 (L) 78.0 - 100.0 fL   MCH 21.1 (L) 26.0 - 34.0 pg   MCHC 27.5 (L) 30.0 - 36.0 g/dL   RDW 81.1 (H) 91.4 - 78.2 %   Platelets 161 150 - 400 K/uL  APTT     Status: Abnormal   Collection Time: 07/30/15  4:20 AM  Result Value Ref Range   aPTT 23 (L) 24 - 37 seconds  Glucose, capillary     Status: Abnormal   Collection Time: 07/30/15  8:18 AM  Result Value Ref Range   Glucose-Capillary 144 (H) 65 - 99 mg/dL  Glucose, capillary     Status: Abnormal   Collection Time: 07/30/15 10:52 AM  Result Value Ref Range   Glucose-Capillary 160 (H) 65 - 99 mg/dL   Comment 1 Capillary Specimen   CBC     Status: Abnormal   Collection Time: 07/30/15 11:02 AM  Result Value Ref Range   WBC 2.7 (L) 4.0 - 10.5 K/uL   RBC 3.52 (L) 3.87 - 5.11 MIL/uL   Hemoglobin 7.6 (L) 12.0 - 15.0 g/dL   HCT 95.6 (L) 21.3 - 08.6 %   MCV 77.3 (L) 78.0 - 100.0 fL   MCH 21.6 (L) 26.0 - 34.0 pg   MCHC 27.9 (L) 30.0 - 36.0 g/dL   RDW 57.8 (H) 46.9 - 62.9 %   Platelets 152 150 - 400 K/uL  I-STAT 3, arterial blood gas (G3+)     Status: Abnormal    Collection Time: 07/30/15 11:15 AM  Result Value Ref Range   pH, Arterial 7.292 (L) 7.350 - 7.450   pCO2 arterial 85.4 (HH) 35.0 - 45.0 mmHg   pO2, Arterial 111.0 (H) 80.0 - 100.0 mmHg   Bicarbonate 41.4 (H) 20.0 - 24.0 mEq/L   TCO2 44 0 - 100 mmol/L   O2 Saturation 97.0 %   Acid-Base Excess 13.0 (H) 0.0 - 2.0 mmol/L   Patient temperature 98.1 F    Collection site RADIAL, ALLEN'S TEST ACCEPTABLE  Drawn by Operator    Sample type ARTERIAL    Comment NOTIFIED PHYSICIAN      No results found.  ROS:  As stated above in the HPI otherwise negative.  Blood pressure 131/77, pulse 82, temperature 98.7 F (37.1 C), temperature source Oral, resp. rate 17, height 5\' 7"  (1.702 m), weight 116.8 kg (257 lb 8 oz), SpO2 100 %.    PE: Gen: NAD, Alert and Oriented HEENT:  [place]/AT, EOMI Neck: Supple, no LAD Lungs: CTA Bilaterally CV: RRR without M/G/R ABM: Soft, NTND, +BS, morbidly obese Ext: No C/C/E  Assessment/Plan: 1) Symptomatic anemia. 2) ? Melena. 3) OSA.   She may have two sources of bleeding, i.e., her hemorrhoids and her cecal AVM.  It is difficult to discern the source of bleeding from the reports of hematochezia to darker stool.  Regardless, she is severely anemia and a repeat colonoscopy is warranted.  Plan: 1) Colonoscopy with APC of the cecal AVM. 2) Follow HGB and transfuse as necessary.    Mykeria Garman D 07/30/2015, 4:06 PM

## 2015-07-31 ENCOUNTER — Encounter (HOSPITAL_COMMUNITY)
Admission: EM | Disposition: A | Discharge status: Other Acute Inpatient Hospital | Payer: Self-pay | Source: Home / Self Care | Attending: Student in an Organized Health Care Education/Training Program

## 2015-07-31 ENCOUNTER — Encounter (HOSPITAL_COMMUNITY): Payer: Self-pay | Admitting: *Deleted

## 2015-07-31 DIAGNOSIS — E662 Morbid (severe) obesity with alveolar hypoventilation: Secondary | ICD-10-CM

## 2015-07-31 HISTORY — PX: COLONOSCOPY: SHX5424

## 2015-07-31 HISTORY — PX: HOT HEMOSTASIS: SHX5433

## 2015-07-31 LAB — TYPE AND SCREEN
ABO/RH(D): A POS
Antibody Screen: NEGATIVE
UNIT DIVISION: 0
UNIT DIVISION: 0

## 2015-07-31 LAB — GLUCOSE, CAPILLARY
GLUCOSE-CAPILLARY: 173 mg/dL — AB (ref 65–99)
GLUCOSE-CAPILLARY: 106 mg/dL — AB (ref 65–99)
GLUCOSE-CAPILLARY: 117 mg/dL — AB (ref 65–99)
Glucose-Capillary: 113 mg/dL — ABNORMAL HIGH (ref 65–99)
Glucose-Capillary: 114 mg/dL — ABNORMAL HIGH (ref 65–99)

## 2015-07-31 LAB — CBC
HCT: 29.2 % — ABNORMAL LOW (ref 36.0–46.0)
HEMOGLOBIN: 8 g/dL — AB (ref 12.0–15.0)
MCH: 21.6 pg — ABNORMAL LOW (ref 26.0–34.0)
MCHC: 27.4 g/dL — ABNORMAL LOW (ref 30.0–36.0)
MCV: 78.7 fL (ref 78.0–100.0)
PLATELETS: 156 10*3/uL (ref 150–400)
RBC: 3.71 MIL/uL — AB (ref 3.87–5.11)
RDW: 19.2 % — ABNORMAL HIGH (ref 11.5–15.5)
WBC: 5.6 10*3/uL (ref 4.0–10.5)

## 2015-07-31 LAB — HIV ANTIBODY (ROUTINE TESTING W REFLEX): HIV SCREEN 4TH GENERATION: NONREACTIVE

## 2015-07-31 LAB — HCV COMMENT:

## 2015-07-31 LAB — HEPATITIS C ANTIBODY (REFLEX): HCV Ab: 0.1 s/co ratio (ref 0.0–0.9)

## 2015-07-31 SURGERY — COLONOSCOPY
Anesthesia: Moderate Sedation

## 2015-07-31 MED ORDER — FENTANYL CITRATE (PF) 100 MCG/2ML IJ SOLN
INTRAMUSCULAR | Status: AC
Start: 2015-07-31 — End: 2015-07-31
  Filled 2015-07-31: qty 4

## 2015-07-31 MED ORDER — DIPHENHYDRAMINE HCL 50 MG/ML IJ SOLN
INTRAMUSCULAR | Status: AC
  Filled 2015-07-31: qty 1

## 2015-07-31 MED ORDER — MIDAZOLAM HCL 5 MG/ML IJ SOLN
INTRAMUSCULAR | Status: AC
  Filled 2015-07-31: qty 2

## 2015-07-31 MED ORDER — MIDAZOLAM HCL 5 MG/5ML IJ SOLN
INTRAMUSCULAR | Status: DC | PRN
  Administered 2015-07-31: 1 mg via INTRAVENOUS
  Administered 2015-07-31: 2 mg via INTRAVENOUS

## 2015-07-31 MED ORDER — FENTANYL CITRATE (PF) 100 MCG/2ML IJ SOLN
INTRAMUSCULAR | Status: DC | PRN
  Administered 2015-07-31: 12.5 ug via INTRAVENOUS
  Administered 2015-07-31: 25 ug via INTRAVENOUS

## 2015-07-31 NOTE — Op Note (Signed)
[facility] [address] [place], [address]   COLONOSCOPY PROCEDURE REPORT  PATIENT: Shelley [name], Shelley [name]  MR#: [id number] BIRTHDATE: 1965/06/20 , 50  yrs. old GENDER: female ENDOSCOPIST: Jeani Hawking, MD REFERRED BY: PROCEDURE DATE:  08/02/2015 PROCEDURE:   Colonoscopy with ablation ASA CLASS:   Class III INDICATIONS: Anemia and lower GI bleed MEDICATIONS: Versed 3 mg IV and Fentanyl 37.5 mcg IV  DESCRIPTION OF PROCEDURE:   After the risks and benefits and of the procedure were explained, informed consent was obtained.  revealed no abnormalities of the rectum.    The EC-[id number] ([id number]) endoscope was introduced through the anus and advanced to the cecum, which was identified by both the appendix and ileocecal valve .  The quality of the prep was good. .  The instrument was then slowly withdrawn as the colon was fully examined. Estimated blood loss is zero unless otherwise noted in this procedure report.   FINDINGS: In the cecum a periappendiceal erythematous patch was identified.  This did not appear to be a classic AVM, but it was ablated with APC.  Close and careful inspection of the cecum was performed several times and no other abnormalities were noted.  No evidence of any recent bleeding.  In the distal transverse colon and into the left side of the colon scattered nonbleeding diverticular were identified.  In the rectum she was noted to have large hemorrhoids.  I believe her hematochezia is from this source. The scope was then withdrawn from the patient and the procedure completed.  WITHDRAWAL TIME: 12.9 minutes  COMPLICATIONS: There were no immediate complications. ENDOSCOPIC IMPRESSION: 1) ? Cecal AVM s/p APC. 2) Diverticula.  RECOMMENDATIONS: 1) No further GI evaluation. 2) Bleeding persists she needs to follow up with Surgery for further management of her hemorrhoids.  REPEAT EXAM: cc: _______________________________ eSignedJeani Hawking, MD 08/02/15 4:30 PM  CPT CODES: ICD CODES:

## 2015-07-31 NOTE — Progress Notes (Signed)
Pt is on NIV at this time tolerating it well.  

## 2015-07-31 NOTE — Interval H&P Note (Signed)
History and Physical Interval Note:  07/31/2015 3:27 PM  Shelley [name]  has presented today for surgery, with the diagnosis of GI bleed  The various methods of treatment have been discussed with the patient and family. After consideration of risks, benefits and other options for treatment, the patient has consented to  Procedure(s): COLONOSCOPY (N/A) as a surgical intervention .  The patient's history has been reviewed, patient examined, no change in status, stable for surgery.  I have reviewed the patient's chart and labs.  Questions were answered to the patient's satisfaction.     Jalexa Pifer D

## 2015-07-31 NOTE — Progress Notes (Addendum)
Subjective: This morning, she was resting comfortably while wearing the BiPAP machine. She denies any additional bloody bowel movements overnight.   Objective: Vital signs in last 24 hours: Filed Vitals:   07/31/15 0000 07/31/15 0256 07/31/15 0400 07/31/15 0500  BP: 117/55 117/55 138/70   Pulse: 86 88 88   Temp:   97.7 F (36.5 C)   TempSrc:   Axillary   Resp: Height:      Weight:    265 lb 6.9 oz (120.4 kg)  SpO2: 100% 100% 97%    Weight change: 7 lb 15 oz (3.6 kg)  Intake/Output Summary (Last 24 hours) at 07/31/15 0714 Last data filed at 07/31/15 0600  Gross per 24 hour  Intake 1301.67 ml  Output    650 ml  Net 651.67 ml   General: young, obese Caucasian female, resting in bed HEENT: normocephalic, atraumatic Cardiac: regular rate, no rubs, murmurs or gallops Pulm: clear to auscultation bilaterally, moving normal volumes of air Abd: soft, nontender, nondistended, BS present Ext: warm and well perfused, 2+ pulses radial/pedal bilaterally, no pedal edema Neuro: responding slowly likely from sleepiness, moving all extremities spontaneously  Lab Results: Basic Metabolic Panel:  Recent Labs Lab 07/29/15 1608 07/29/15 2314 07/30/15 0420  NA 142  --  143  K 3.8  --  3.7  CL 98*  --  97*  CO2 33*  --  37*  GLUCOSE 157*  --  167*  BUN 8  --  5*  CREATININE 0.58  --  0.51  CALCIUM 8.5*  --  8.5*  MG  --  1.8  --   PHOS  --  3.4  --    CBC:  Recent Labs Lab 07/30/15 1102 07/30/15 2030 07/31/15 0330  WBC 2.7*  --  5.6  HGB 7.6* 7.6* 8.0*  HCT 27.2* 28.3* 29.2*  MCV 77.3*  --  78.7  PLT 152  --  156   CBG:  Recent Labs Lab 07/30/15 1052 07/30/15 1739 07/30/15 1938 07/30/15 2156 07/30/15 2315 07/31/15 0403  GLUCAP 160* 149* 175* 112* 91 173*   Medications: I have reviewed the patient's current medications. Scheduled Meds: . antiseptic oral rinse  7 mL Mouth Rinse BID  . budesonide  0.25 mg Nebulization BID  . insulin aspart  0-9  Units Subcutaneous TID WC  . montelukast  10 mg Oral QHS  . pantoprazole (PROTONIX) IV  40 mg Intravenous Q24H  . sertraline  100 mg Oral Daily  . sodium chloride flush  3 mL Intravenous Q12H   Continuous Infusions: . sodium chloride 20 mL/hr at 07/30/15 2000   PRN Meds:.acetaminophen **OR** acetaminophen, albuterol, ondansetron **OR** ondansetron (ZOFRAN) IV, witch hazel-glycerin Assessment/Plan:  Ms. Riccardi is a 50 year old woman with developmental delay, asthma, anxiety, DM, diverticulosis, hemorrhoids, AVM and OSA here with symptomatic anemia.  Symptomatic anemia 2/2 to LGIB:  No bloody bowel movements overnight. s/p 2 units PRBC and Feraheme . Scheduled for colonoscopy today. -Follow-up colonoscopy recommendations today -Check CBC if she has bloody bowel movements -Resume home iron therapy after colonoscopy  LGIB:  She reports a chronic hx of intermittent BRBPR that has increased in recent days.  She last bled 3 days ago.  + FOBT with known hemorrhoid, diverticula and cecal AVM (s/p APC during 2014 colonoscopy by Dr. Elnoria Howard).  At that time, recommendations were for hemorrhoidal banding if she had persistent hematochezia.  -Colonoscopy as noted above -Continue Protonix  IV  Chronic respiratory  failure 2/2 OHS/OSA:  On BiPAP (w/ 4L when she sleeps) at home.  Tolerated well overnight -Continue supplemental oxygen -Continue BiPAP prn while sleeping w/ 4L oxygen  Asthma:  Continue Pulmicort, Singulair, nebs.  Anxiety/depression: Coninue Zoloft.  Diet:  NPO for colonoscopy then carb modified  VTE ppx:  SCDs  Code status:  Full  Dispo: Disposition is deferred at this time, awaiting improvement of current medical problems.  Anticipated discharge in approximately 1-2 day(s).   The patient does have a current PCP Maurice Small, MD) and does not need an [facility] hospital follow-up appointment after discharge.  The patient does not have transportation limitations that hinder  transportation to clinic appointments.  .Services Needed at time of discharge: Y = Yes, Blank = No PT: Home Health PT  OT: Home Health OT  RN:   Equipment:   Other:     LOS: 2 days   Yetunde Leis Terrilee Croak, MD 07/31/2015, 7:14 AM

## 2015-07-31 NOTE — Evaluation (Signed)
Occupational Therapy Evaluation Patient Details Name: Shelley [name] MRN: [id number] DOB: [birth year] Today's Date: 07/31/2015    History of Present Illness Patient is a 50 yo F with a PMHx of hemmorhoids, cecal AVM, diverticulosis, asthma, OSA, chronic respiratory failure 2/2 restrictive lung disease on home oxygen (4 liters with activity, 2 liters at rest, Bipap at night), morbid obesity, anxiety, mental retardation (lives ALF[facility]) presenting to the hospital with a chief complaint of weakness. Caregiver states patient has been losing blood per rectum for the past 2 years. Patient reports having rectal pain with defecation. However, denies having any abdominal pain. Caregiver states patient has chronic loose stools but no recent antibiotic use. Hemoglobin was 9.3 a month ago. States patient has been feeling tired, dizzy, and falling for the past 1-2 weeks. Patient denies having any injuries from those falls. Caregiver states patient is chronically short of breath and currently on 4 L home oxygen but does not use her BiPAP machine at night. Patient denies having any shortness of breath is present.   Clinical Impression   This 50 yo female admitted with above presents to acute OT with deficits below. She will benefit from acute OT with follow up HHOT to get back to her PLOF of only needing A for showering from a basic ADL standpoint.    Follow Up Recommendations  Home health OT    Equipment Recommendations  None recommended by OT       Precautions / Restrictions Precautions Precautions: Fall Restrictions Weight Bearing Restrictions: No      Mobility Bed Mobility Overal bed mobility: Needs Assistance Bed Mobility: Supine to Sit     Supine to sit: Min assist;HOB elevated (use of rail)     General bed mobility comments: A at trunk  Transfers Overall transfer level: Needs assistance Equipment used: Rolling walker (2 wheeled) Transfers: Sit to/from Stand Sit to Stand:  Supervision         General transfer comment: Pt ambulated 150 feet with RW and O2 at 4 liters with S    Balance Overall balance assessment: Needs assistance;History of Falls Sitting-balance support: No upper extremity supported;Feet supported Sitting balance-Leahy Scale: Good     Standing balance support: Single extremity supported Standing balance-Leahy Scale: Poor Standing balance comment: reliant with at least on arm on RW for stability                            ADL Overall ADL's : Needs assistance/impaired Eating/Feeding: Independent;Sitting   Grooming: Set up;Sitting   Upper Body Bathing: Set up;Sitting   Lower Body Bathing: Supervison/ safety;Sit to/from stand;Set up Lower Body Bathing Details (indicate cue type and reason): Pt can cross one leg over the other to get to her feet while she is in a seated position Upper Body Dressing : Set up;Sitting   Lower Body Dressing: Supervision/safety;Set up;Sit to/from stand Lower Body Dressing Details (indicate cue type and reason): Pt can cross one leg over the other to get to her feet while she is in a seated position Toilet Transfer: Supervision/safety;Ambulation;RW;Comfort height toilet;Grab bars   Toileting- Clothing Manipulation and Hygiene: Supervision/safety;Sit to/from stand                         Pertinent Vitals/Pain Pain Assessment: No/denies pain     Hand Dominance Right   Extremity/Trunk Assessment Upper Extremity Assessment Upper Extremity Assessment: Generalized weakness  Communication Communication Communication: No difficulties   Cognition Arousal/Alertness: Awake/alert Behavior During Therapy: WFL for tasks assessed/performed Overall Cognitive Status: History of cognitive impairments - at baseline                                Home Living Family/patient expects to be discharged to:: Assisted living                             Home  Equipment: Walker - 4 wheels;Shower seat;Grab bars - toilet;Grab bars - tub/shower;Other (comment) (O2 (4liters with activity, 2 at rest per pt))   Additional Comments: Pt has caregiver come 2-3 hours in the morning to help her with ADL's (showering per pt, she dresses herself) and goes to the dining room for meals.       Prior Functioning/Environment Level of Independence: Needs assistance  Gait / Transfers Assistance Needed: Modified independent with RW ADL's / Homemaking Assistance Needed: Pt reports she only needs A in shower due to h/o falls and is not allowed to shower by herself--staff A her in /out and some with actual bathing per pt; she reports she dresses herself   Comments: Mental Retardation, no problems with communication. Likes things explained.     OT Diagnosis: Generalized weakness   OT Problem List: Decreased strength;Impaired balance (sitting and/or standing);Obesity   OT Treatment/Interventions: Self-care/ADL training;Patient/family education;Balance training;DME and/or AE instruction    OT Goals(Current goals can be found in the care plan section) Acute Rehab OT Goals Patient Stated Goal: To go home.  OT Goal Formulation: With patient Time For Goal Achievement: 08/07/15 Potential to Achieve Goals: Good  OT Frequency: Min 2X/week              End of Session Equipment Utilized During Treatment: Gait belt;Rolling walker;Oxygen (4 liters) Nurse Communication:  (chair alarm on, pt had bowel movement (runny from prep) and urinated)  Activity Tolerance: Patient tolerated treatment well Patient left: in chair;with call bell/phone within reach;with chair alarm set   Time: 1015-1101 OT Time Calculation (min): 46 min Charges:  OT General Charges $OT Visit: 1 Procedure OT Evaluation $OT Eval Moderate Complexity: 1 Procedure OT Treatments $Self Care/Home Management : 23-37 mins  Evette Georges [phone] 07/31/2015, 11:15 AM

## 2015-07-31 NOTE — Progress Notes (Signed)
Patient done with NuLytely at 01:35 on 07/30/14. Patient NPO at 01:35 on 07/30/14. MD aware that patient needed extra time to ingest NuLytely and was NPO later than midnight.

## 2015-07-31 NOTE — Progress Notes (Signed)
Internal Medicine Attending:   I saw and examined the patient. I reviewed the resident's note and I agree with the resident's findings and plan as documented in the resident's note.  No further lower GI bleeding since admission. She completed bowel preparation last night and is planned for colonoscopy today, may need further intervention because of her history of colonic angiectasia. She has chronic hypercarbic respiratory failure related to obesity hypoventilation syndrome, was very drowsy yesterday and still this morning. I anticipate because she will receive anesthesia today, it will be safest to keep her here overnight on BiPAP and discharge to home tomorrow.

## 2015-07-31 NOTE — Progress Notes (Signed)
Subjective: Patient reports that she had watery BM last night, but denies blood.  Per EMR, patient was given GoLytely in preparation for colonoscopy today.  Patient denies any pain at this time.  Patient was places back on her BiPAP with help of RN so she could go back to sleep. Objective: Vital signs in last 24 hours: Filed Vitals:   07/31/15 0000 07/31/15 0256 07/31/15 0400 07/31/15 0500  BP: 117/55 117/55 138/70   Pulse: 86 88 88   Temp:   97.7 F (36.5 C)   TempSrc:   Axillary   Resp: Height:      Weight:    120.4 kg (265 lb 6.9 oz)  SpO2: 100% 100% 97%    Weight change: 3.6 kg (7 lb 15 oz)  Intake/Output Summary (Last 24 hours) at 07/31/15 4540 Last data filed at 07/31/15 0600  Gross per 24 hour  Intake 1301.67 ml  Output    650 ml  Net 651.67 ml   General Appearance: Obese female, lying in hospital bed wearing BiPAP and sleeping soundly, easily awakened after removal of BiPAP, cooperative, no distress Lungs: Clear to auscultation bilaterally, respirations unlabored Heart: Regular rate and rhythm, S1 and S2 normal, no murmur, rub   or gallop Abdomen: Obese, soft, non-tender, bowel sounds active all four quadrants,no masses, no organomegaly Extremities: Extremities normal, atraumatic, no cyanosis or edema Pulses: 2+ and symmetric in all extremities  Lab Results: CBC:   WBC: 5.6  Hgb: 8.0  Hct: 29.2  Plt: 156  BMP: remarkable for stable but elevated CO2 at 37   Medications:  Scheduled Meds: . antiseptic oral rinse  7 mL Mouth Rinse BID  . budesonide  0.25 mg Nebulization BID  . insulin aspart  0-9 Units Subcutaneous TID WC  . montelukast  10 mg Oral QHS  . pantoprazole (PROTONIX) IV  40 mg Intravenous Q24H  . sertraline  100 mg Oral Daily  . sodium chloride flush  3 mL Intravenous Q12H   Continuous Infusions: . sodium chloride 20 mL/hr at 07/30/15 2000   PRN Meds:.acetaminophen **OR** acetaminophen, albuterol, ondansetron **OR** ondansetron  (ZOFRAN) IV, witch hazel-glycerin Assessment/Plan:  Shelley [name] is a 50 year old woman with history of diverticulosis, internal and external hemorrhoids, cecal AVM, GERD,DM, chronic resp failure secondary to OHS/OSA, HTN, HLD, anxiety, mental retardation who presented with complaints of fatigue, dizziness, and falling in the setting of intermittent bright red blood per rectum and melena who was found to have hgb of 6.0 with low MCV and elevated RDW on initial presentation.    Symptomatic Anemia: This is likely secondary to chronic intermittent lower GI bleed over the last 2 years. No bloody bm in 4 days. CBC results and ferritin of 7 consistent with iron deficiency anemia. Total iron deficit 3.5 g. Patient takes ferrex 150 mg daily at home.  - Discontinue monitoring hgb Q8H - May consider restarting home ferrex tomorrow after colonoscopy   Chronic Lower GI Bleed: Reported history of BRBPR and melena intermittently over the last 2 years. Her last bloody bowel movement was about 4 days ago. FOBT+ on initial presentation. Known to have cecal AVM s/p APC, internal and external hemorrhoid, diverticula from colonoscopy in 2014.Seen by Dr. Elnoria Howard yesterday and will be taken for colonoscopy today. - GI consulted - Colonoscopy today, will follow up recs  Chronic Respiratory Failure 2/2 OHS/OSA: Patient wears BiPAP at night at home.  - Continue supplemental oxygen by [place] while awake - Continue albuterol  2.5 mg nebulized Q6H prn wheezing, shortness of breath - Continue budesonide 0.25 mg nebulized BID - Continue montelukast 10 mg PO daily - BiPAP available as needed while sleeping  FENGI/DVT Ppx:  Fluids: NS @ 20 ml/hr per GI for pre-procedure prep Electrolytes: no need to replete at this time Nutrition: NPO for colonoscopy today GI: pantoprazole 40 mg IV, ondansetron 4 mg Q6H prn  DVT Ppx: SCDs, pharmacologic VTE ppx contraindicated in  setting of active GI bleed  Dispo: Continue step-down status given need for intermittent BiPAP.  Dispo decision making pending today's colonoscopy.  This is a Psychologist, occupational Note.  The care of the patient was discussed with Dr. Heywood Iles  and the assessment and plan formulated with their assistance.  Please see their attached note for official documentation of the daily encounter.   LOS: 2 days   Shelley [name], Med Student 07/31/2015, 8:12 AM

## 2015-07-31 NOTE — H&P (View-Only) (Signed)
Reason for Consult: Symptomatic anemia and GI bleed Referring Physician: Teaching Service  Penny A Dieter HPI: This is a 50 year old female with a PMH of cecal AVMs, diverticula, and hematochezia.  She was admitted to the hospital as she was symptomatic from her anemia.  Her admission HGB was at 6.0 g/dL and her HGB one month ago in her PCP's office was at 9.3 g/dL.  She reports dizzy and fatigued for the past couple of weeks.  No issues with SOB, but she has OSA.  The patient underwent a colonoscopy in 2011 for complaints of hematochezia with findings of diverticula, cecal AVMs, and hemorrhoids.  She represented in 2014 with hematochezia and then it was felt that she would benefit from ablation of her cecal AVMs as a potential source of bleeding.  One out of the two AVMs was ablated and the second faded during the colonoscopy and it was not able to be re-identified.  During the interval time period she complains of hematochezia and some darker stools.  She was evaluated by Surgery for her hemorrhoids, but only conservative management was pursued.  Past Medical History  Diagnosis Date  . Asthma   . Diabetes mellitus   . Hypertension   . Hyperlipidemia   . Pneumonia   . Anxiety   . GERD (gastroesophageal reflux disease)   . Arthritis   . Mental retardation   . Hiatal hernia   . AVM (arteriovenous malformation) of colon   . Hemorrhoids     Internal and external  . Diverticulosis     Past Surgical History  Procedure Laterality Date  . Cholecystectomy      2006  . Eye surgery    . Colonoscopy N/A 02/06/2013    Procedure: COLONOSCOPY;  Surgeon: Rayvon Dakin D Shaden Higley, MD;  Location: [facility];  Service: Endoscopy;  Laterality: N/A;  . Esophagogastroduodenoscopy N/A 02/06/2013    Procedure: ESOPHAGOGASTRODUODENOSCOPY (EGD);  Surgeon: Ruhama Lehew D Afton Lavalle, MD;  Location: [facility];  Service: Endoscopy;  Laterality: N/A;    Family History  Problem Relation Age of Onset  . COPD Mother     Social  History:  reports that she has never smoked. She has never used smokeless tobacco. She reports that she does not drink alcohol or use illicit drugs.  Allergies:  Allergies  Allergen Reactions  . Fish Allergy Swelling  . Pepto-Bismol [Bismuth Subsalicylate] Nausea And Vomiting  . Cortisone Itching and Rash    Injections and cream only.  PO ok.  . Trichophyton Itching    Also sneezing accompanying as well    Medications:  Scheduled: . antiseptic oral rinse  7 mL Mouth Rinse BID  . budesonide  0.25 mg Nebulization BID  . insulin aspart  0-9 Units Subcutaneous TID WC  . montelukast  10 mg Oral QHS  . pantoprazole (PROTONIX) IV  40 mg Intravenous Q24H  . polyethylene glycol-electrolytes  4,000 mL Oral Once  . sertraline  100 mg Oral Daily  . sodium chloride flush  3 mL Intravenous Q12H   Continuous: . sodium chloride      Results for orders placed or performed during the hospital encounter of 07/29/15 (from the past 24 hour(s))  Comprehensive metabolic panel     Status: Abnormal   Collection Time: 07/29/15  4:08 PM  Result Value Ref Range   Sodium 142 135 - 145 mmol/L   Potassium 3.8 3.5 - 5.1 mmol/L   Chloride 98 (L) 101 - 111 mmol/L   CO2 33 (H) 22 -   32 mmol/L   Glucose, Bld 157 (H) 65 - 99 mg/dL   BUN 8 6 - 20 mg/dL   Creatinine, Ser 0.58 0.44 - 1.00 mg/dL   Calcium 8.5 (L) 8.9 - 10.3 mg/dL   Total Protein 7.1 6.5 - 8.1 g/dL   Albumin 3.2 (L) 3.5 - 5.0 g/dL   AST 21 15 - 41 U/L   ALT 16 14 - 54 U/L   Alkaline Phosphatase 70 38 - 126 U/L   Total Bilirubin 0.3 0.3 - 1.2 mg/dL   GFR calc non Af Amer >60 >60 mL/min   GFR calc Af Amer >60 >60 mL/min   Anion gap 11 5 - 15  CBC     Status: Abnormal   Collection Time: 07/29/15  4:08 PM  Result Value Ref Range   WBC 3.4 (L) 4.0 - 10.5 K/uL   RBC 3.03 (L) 3.87 - 5.11 MIL/uL   Hemoglobin 6.0 (LL) 12.0 - 15.0 g/dL   HCT 22.5 (L) 36.0 - 46.0 %   MCV 74.3 (L) 78.0 - 100.0 fL   MCH 19.8 (L) 26.0 - 34.0 pg   MCHC 26.7 (L) 30.0  - 36.0 g/dL   RDW 18.4 (H) 11.5 - 15.5 %   Platelets 155 150 - 400 K/uL  Type and screen [facility]     Status: None (Preliminary result)   Collection Time: 07/29/15  4:08 PM  Result Value Ref Range   ABO/RH(D) A POS    Antibody Screen NEG    Sample Expiration 08/01/2015    Unit Number [id number]    Blood Component Type RED CELLS,LR    Unit division 00    Status of Unit ISSUED,FINAL    Transfusion Status OK TO TRANSFUSE    Crossmatch Result Compatible    Unit Number [id number]    Blood Component Type RED CELLS,LR    Unit division 00    Status of Unit ISSUED    Transfusion Status OK TO TRANSFUSE    Crossmatch Result Compatible   ABO/Rh     Status: None   Collection Time: 07/29/15  4:08 PM  Result Value Ref Range   ABO/RH(D) A POS   POC occult blood, ED     Status: Abnormal   Collection Time: 07/29/15  5:28 PM  Result Value Ref Range   Fecal Occult Bld POSITIVE (A) NEGATIVE  Prepare RBC     Status: None   Collection Time: 07/29/15  6:00 PM  Result Value Ref Range   Order Confirmation ORDER PROCESSED BY BLOOD BANK   Protime-INR     Status: None   Collection Time: 07/29/15  6:52 PM  Result Value Ref Range   Prothrombin Time 13.3 11.6 - 15.2 seconds   INR 0.99 0.00 - 1.49  Magnesium     Status: None   Collection Time: 07/29/15 11:14 PM  Result Value Ref Range   Magnesium 1.8 1.7 - 2.4 mg/dL  Phosphorus     Status: None   Collection Time: 07/29/15 11:14 PM  Result Value Ref Range   Phosphorus 3.4 2.5 - 4.6 mg/dL  APTT     Status: Abnormal   Collection Time: 07/29/15 11:14 PM  Result Value Ref Range   aPTT 189 (H) 24 - 37 seconds  Technologist smear review     Status: None   Collection Time: 07/29/15 11:14 PM  Result Value Ref Range   Tech Review ELLIPTOCYTES   TSH     Status: None   Collection   Time: 07/29/15 11:15 PM  Result Value Ref Range   TSH 0.700 0.350 - 4.500 uIU/mL  Ferritin     Status: Abnormal   Collection Time: 07/29/15 11:15 PM   Result Value Ref Range   Ferritin 7 (L) 11 - 307 ng/mL  MRSA PCR Screening     Status: None   Collection Time: 07/30/15  2:00 AM  Result Value Ref Range   MRSA by PCR NEGATIVE NEGATIVE  Basic metabolic panel     Status: Abnormal   Collection Time: 07/30/15  4:20 AM  Result Value Ref Range   Sodium 143 135 - 145 mmol/L   Potassium 3.7 3.5 - 5.1 mmol/L   Chloride 97 (L) 101 - 111 mmol/L   CO2 37 (H) 22 - 32 mmol/L   Glucose, Bld 167 (H) 65 - 99 mg/dL   BUN 5 (L) 6 - 20 mg/dL   Creatinine, Ser 0.51 0.44 - 1.00 mg/dL   Calcium 8.5 (L) 8.9 - 10.3 mg/dL   GFR calc non Af Amer >60 >60 mL/min   GFR calc Af Amer >60 >60 mL/min   Anion gap 9 5 - 15  CBC     Status: Abnormal   Collection Time: 07/30/15  4:20 AM  Result Value Ref Range   WBC 2.8 (L) 4.0 - 10.5 K/uL   RBC 3.61 (L) 3.87 - 5.11 MIL/uL   Hemoglobin 7.6 (L) 12.0 - 15.0 g/dL   HCT 27.6 (L) 36.0 - 46.0 %   MCV 76.5 (L) 78.0 - 100.0 fL   MCH 21.1 (L) 26.0 - 34.0 pg   MCHC 27.5 (L) 30.0 - 36.0 g/dL   RDW 18.4 (H) 11.5 - 15.5 %   Platelets 161 150 - 400 K/uL  APTT     Status: Abnormal   Collection Time: 07/30/15  4:20 AM  Result Value Ref Range   aPTT 23 (L) 24 - 37 seconds  Glucose, capillary     Status: Abnormal   Collection Time: 07/30/15  8:18 AM  Result Value Ref Range   Glucose-Capillary 144 (H) 65 - 99 mg/dL  Glucose, capillary     Status: Abnormal   Collection Time: 07/30/15 10:52 AM  Result Value Ref Range   Glucose-Capillary 160 (H) 65 - 99 mg/dL   Comment 1 Capillary Specimen   CBC     Status: Abnormal   Collection Time: 07/30/15 11:02 AM  Result Value Ref Range   WBC 2.7 (L) 4.0 - 10.5 K/uL   RBC 3.52 (L) 3.87 - 5.11 MIL/uL   Hemoglobin 7.6 (L) 12.0 - 15.0 g/dL   HCT 27.2 (L) 36.0 - 46.0 %   MCV 77.3 (L) 78.0 - 100.0 fL   MCH 21.6 (L) 26.0 - 34.0 pg   MCHC 27.9 (L) 30.0 - 36.0 g/dL   RDW 18.6 (H) 11.5 - 15.5 %   Platelets 152 150 - 400 K/uL  I-STAT 3, arterial blood gas (G3+)     Status: Abnormal    Collection Time: 07/30/15 11:15 AM  Result Value Ref Range   pH, Arterial 7.292 (L) 7.350 - 7.450   pCO2 arterial 85.4 (HH) 35.0 - 45.0 mmHg   pO2, Arterial 111.0 (H) 80.0 - 100.0 mmHg   Bicarbonate 41.4 (H) 20.0 - 24.0 mEq/L   TCO2 44 0 - 100 mmol/L   O2 Saturation 97.0 %   Acid-Base Excess 13.0 (H) 0.0 - 2.0 mmol/L   Patient temperature 98.1 F    Collection site RADIAL, ALLEN'S TEST ACCEPTABLE      Drawn by Operator    Sample type ARTERIAL    Comment NOTIFIED PHYSICIAN      No results found.  ROS:  As stated above in the HPI otherwise negative.  Blood pressure 131/77, pulse 82, temperature 98.7 F (37.1 C), temperature source Oral, resp. rate 17, height 5' 7" (1.702 m), weight 116.8 kg (257 lb 8 oz), SpO2 100 %.    PE: Gen: NAD, Alert and Oriented HEENT:  [place]/AT, EOMI Neck: Supple, no LAD Lungs: CTA Bilaterally CV: RRR without M/G/R ABM: Soft, NTND, +BS, morbidly obese Ext: No C/C/E  Assessment/Plan: 1) Symptomatic anemia. 2) ? Melena. 3) OSA.   She may have two sources of bleeding, i.e., her hemorrhoids and her cecal AVM.  It is difficult to discern the source of bleeding from the reports of hematochezia to darker stool.  Regardless, she is severely anemia and a repeat colonoscopy is warranted.  Plan: 1) Colonoscopy with APC of the cecal AVM. 2) Follow HGB and transfuse as necessary.    Jerricka Carvey D 07/30/2015, 4:06 PM      

## 2015-08-01 LAB — GLUCOSE, CAPILLARY
GLUCOSE-CAPILLARY: 110 mg/dL — AB (ref 65–99)
GLUCOSE-CAPILLARY: 129 mg/dL — AB (ref 65–99)

## 2015-08-01 NOTE — Care Management Note (Addendum)
Case Management Note  Patient Details  Name: DARRELLE BARRELL MRN: [id number] Date of Birth: Oct 01, 1965  Subjective/Objective:                  Colonoscopy with ablation Action/Plan: Discharge planning Expected Discharge Date:  08/01/15               Expected Discharge Plan:  [facility] services: HHPT/OT  In-House Referral:     Discharge planning Services  CM Consult  Post Acute Care Choice:    Choice offered to:  Patient  DME Arranged:  N/A DME Agency:  NA  HH Arranged: HHPT/OT HH Agency:  [facility]  Status of Service:  In process, will continue to follow  Medicare Important Message Given:    Date Medicare IM Given:    Medicare IM give by:    Date Additional Medicare IM Given:    Additional Medicare Important Message give by:     If discussed at [place] of Stay Meetings, dates discussed:    Additional Comments: Cm met with pt in room who states she has home O2.  Pt is discharged today and CM called pt's transportation Cecile Sheerer [phone] (caretaker) to request to bring home O2 tank to hospital for transportation home.  Unfortunately, no answer and this CM left message to call CM back (with no identifiers per HIPAA).  CM spoke with Sharlene Motts [phone] who states he, too will try to reach [facility] for transport home.  Pt is set up with [facility] for HHPT/OT.  Referral called to [facility] rep, Tiffany.  CM waiting for callback from [place]. CM reached Cecile Sheerer who confirmed she will bring wheelchair and home O2 to the room to pick up pt.  RN notified.  No other CM needs were communicated. Dellie Catholic, RN 08/01/2015, 11:06 AM

## 2015-08-01 NOTE — Progress Notes (Signed)
Discharge instructions given to [facility], pts caregiver at this time.

## 2015-08-01 NOTE — Progress Notes (Signed)
Subjective: Colonoscopy yesterday without actively bleeding AVMs. No bloody bowel movements, abdominal pain, nausea, vomiting overnight as well. She tolerated regular food today and feels good.   Objective: Vital signs in last 24 hours: Filed Vitals:   08/01/15 0900 08/01/15 0903 08/01/15 1000 08/01/15 1100  BP:      Pulse: 81  85 87  Temp:      TempSrc:      Resp:   16 15  Height:      Weight:      SpO2: 100% 100% 100% 100%   Weight change: 7.1 oz (0.2 kg)  Intake/Output Summary (Last 24 hours) at 08/01/15 1123 Last data filed at 08/01/15 0700  Gross per 24 hour  Intake     40 ml  Output    300 ml  Net   -260 ml   General: young, obese Caucasian female, sitting in chair HEENT: normocephalic, atraumatic Cardiac: regular rate, no rubs, murmurs or gallops Pulm: clear to auscultation bilaterally, moving normal volumes of air Abd: soft, nontender, nondistended, BS present Ext: warm and well perfused, 2+ pulses radial/pedal bilaterally, no pedal edema Neuro: responding to questions appropriately, moving all extremities spontaneously  Lab Results: Basic Metabolic Panel:  Recent Labs Lab 07/29/15 1608 07/29/15 2314 07/30/15 0420  NA 142  --  143  K 3.8  --  3.7  CL 98*  --  97*  CO2 33*  --  37*  GLUCOSE 157*  --  167*  BUN 8  --  5*  CREATININE 0.58  --  0.51  CALCIUM 8.5*  --  8.5*  MG  --  1.8  --   PHOS  --  3.4  --    CBC:  Recent Labs Lab 07/30/15 1102 07/30/15 2030 07/31/15 0330  WBC 2.7*  --  5.6  HGB 7.6* 7.6* 8.0*  HCT 27.2* 28.3* 29.2*  MCV 77.3*  --  78.7  PLT 152  --  156   CBG:  Recent Labs Lab 07/30/15 2315 07/31/15 0403 07/31/15 0835 07/31/15 1123 07/31/15 1703 07/31/15 1942  GLUCAP 91 173* 113* 106* 117* 114*   Medications: I have reviewed the patient's current medications. Scheduled Meds: . antiseptic oral rinse  7 mL Mouth Rinse BID  . budesonide  0.25 mg Nebulization BID  . insulin aspart  0-9 Units Subcutaneous TID WC    . montelukast  10 mg Oral QHS  . pantoprazole (PROTONIX) IV  40 mg Intravenous Q24H  . sertraline  100 mg Oral Daily  . sodium chloride flush  3 mL Intravenous Q12H   Continuous Infusions:   PRN Meds:.acetaminophen **OR** acetaminophen, albuterol, ondansetron **OR** ondansetron (ZOFRAN) IV, witch hazel-glycerin Assessment/Plan:  Ms. Nielson is a 50 year old woman with developmental delay, asthma, anxiety, DM, diverticulosis, hemorrhoids, AVM and OSA here with symptomatic anemia.  Symptomatic anemia 2/2 to Lower GI Bleed:  No bloody bowel movements overnight. s/p 2 units PRBC and Feraheme . Colonoscopy results as noted above. -Discharge on home Fe and Prilosec -Colonoscopy as noted above -Discharge with home health services  Chronic respiratory failure 2/2 OHS/OSA:  On BiPAP (w/ 4L when she sleeps) at home.  -Continue supplemental oxygen -Continue BiPAP prn while sleeping w/ 4L oxygen  Asthma:  Resume Pulmicort, Singulair, nebs.  Anxiety/depression: Resume Zoloft.  Diet: Carb modified  VTE ppx:  SCDs  Code status:  Full  Dispo: Disposition is deferred at this time, awaiting improvement of current medical problems.  Anticipated discharge today.   The  patient does have a current PCP Maurice Small, MD) and does not need an [facility] hospital follow-up appointment after discharge.  The patient does not have transportation limitations that hinder transportation to clinic appointments.  .Services Needed at time of discharge: Y = Yes, Blank = No PT: Home Health PT  OT: Home Health OT  RN:   Equipment:   Other:     LOS: 3 days   Keenen Roessner Terrilee Croak, MD 08/01/2015, 11:23 AM

## 2015-08-01 NOTE — Progress Notes (Signed)
Subjective: Patient reports that she is feeling well.  Denies any bloody bowel movement today.  She is cheerful and excited to return home to her assisted living facility.   Objective: Vital signs in last 24 hours: Filed Vitals:   08/01/15 0200 08/01/15 0333 08/01/15 0400 08/01/15 0500  BP:  136/78 131/69   Pulse: 81 85 79   Temp:  97.9 F (36.6 C)    TempSrc:  Axillary    Resp: Height:      Weight:    120.6 kg (265 lb 14 oz)  SpO2: 100% 100% 99%     General Appearance: Obese woman sitting up in hospital recliner, pleasant, alert, no distress, Lungs: Clear to auscultation bilaterally, respirations unlabored Heart: Regular rate and rhythm, S1 and S2 normal, no murmur, rub   or gallop Abdomen: Soft, non-tender, bowel sounds active all four quadrants,no masses, no organomegaly Extremities: Extremities normal, atraumatic, no cyanosis or edema Pulses: 2+ and symmetric in all extremities  Lab Results: no new lab results today  Medications:  Scheduled Meds: . antiseptic oral rinse  7 mL Mouth Rinse BID  . budesonide  0.25 mg Nebulization BID  . insulin aspart  0-9 Units Subcutaneous TID WC  . montelukast  10 mg Oral QHS  . pantoprazole (PROTONIX) IV  40 mg Intravenous Q24H  . sertraline  100 mg Oral Daily  . sodium chloride flush  3 mL Intravenous Q12H   Continuous Infusions:  PRN Meds:.acetaminophen **OR** acetaminophen, albuterol, ondansetron **OR** ondansetron (ZOFRAN) IV, witch hazel-glycerin Assessment/Plan:  Shelley [name] is a 50 year old woman with history of diverticulosis, internal and external hemorrhoids, cecal AVM, GERD,DM, chronic resp failure secondary to OHS/OSA, HTN, HLD, anxiety, mental retardation who presented with complaints of fatigue, dizziness, and falling in the setting of intermittent bright red blood per rectum and melena who was found to have hgb of 6.0 with low MCV and elevated RDW on initial presentation.    Symptomatic Anemia: This is  likely secondary to chronic intermittent lower GI bleed over the last 2 years. No bloody bm in 4 days. CBC results and ferritin of 7 consistent with iron deficiency anemia. Total iron deficit 3.5 g. Patient takes ferrex 150 mg daily at home.  - Discontinue monitoring hgb Q8H - Restart home ferrex upon discharge  Chronic Lower GI Bleed: Reported history of BRBPR and melena intermittently over the last 2 years. Her last bloody bowel movement was about 4 days ago. FOBT+ on initial presentation. Known to have cecal AVM s/p APC, internal and external hemorrhoid, diverticula from colonoscopy in 2014.Colonoscopy performed yesterday found no active bleeding. A possible cecal AVM was ablated with APC.  GI reccommended surgical consultation for hemorrhoids if continued bleeding occurs.  - GI consulted, recs appreciated - Surgical evaluation for hemmoroids if bleeding recurs  Chronic Respiratory Failure 2/2 OHS/OSA and Asthma: Patient wears BiPAP at night at home.  - Continue supplemental oxygen by [place] while awake - Continue albuterol 2.5 mg nebulized Q6H prn wheezing, shortness of breath - Continue budesonide 0.25 mg nebulized BID - Continue montelukast 10 mg PO daily - BiPAP available as needed while sleeping  FENGI/DVT Ppx:  Fluids: None Electrolytes: no need to replete at this time Nutrition: Regular diet GI: pantoprazole 40 mg IV, ondansetron 4 mg Q6H prn  DVT Ppx: SCDs, pharmacologic VTE ppx contraindicated in setting of active GI bleed  Dispo: Patient to be discharged to ALF today  This is a Psychologist, occupational Note.  The care of the patient was discussed with Dr. Heywood Iles and the assessment and plan formulated with their assistance.  Please see their attached note for official documentation of the daily encounter.   LOS: 3 days   Shelley [name], Med Student 08/01/2015, 6:38 AM

## 2015-08-01 NOTE — Progress Notes (Signed)
Pt on NIV at this time tolerating it well.

## 2015-08-01 NOTE — Progress Notes (Signed)
Spoke with pts caregiver, [name]. Asked her to come to pts room for discharge instructions.

## 2015-08-01 NOTE — Discharge Summary (Signed)
Name: Shelley [name] MRN: [id number] DOB: [birth year] 50 y.o. PCP: Maurice Small, MD  Date of Admission: 07/29/2015  4:59 PM Date of Discharge: 08/01/2015 Attending Physician: Shelley Alias, MD  Discharge Diagnosis:  Symptomatic Anemia from Lower GI Bleed Chronic Respiratory Failure Secondary to Obesity Hypoventilation Syndrome and OSA Asthma Non-insulin dependent diabetes Anxiety/Depression   Discharge Medications:   Medication List    TAKE these medications        albuterol (2.5 MG/3ML) 0.083% nebulizer solution  Commonly known as:  PROVENTIL  Take 3 mLs (2.5 mg total) by nebulization every 6 (six) hours as needed for wheezing or shortness of breath.     budesonide 0.25 MG/2ML nebulizer solution  Commonly known as:  PULMICORT  INHALE 1 VIAL VIA NEBULIZER TWICE DAILY.     fenofibrate 160 MG tablet  Take 160 mg by mouth daily.     FERREX 150 150 MG capsule  Generic drug:  iron polysaccharides  Take 150 mg by mouth daily.     loratadine 10 MG tablet  Commonly known as:  CLARITIN  Take 10 mg by mouth daily as needed. For allergies     losartan-hydrochlorothiazide 100-25 MG tablet  Commonly known as:  HYZAAR  Take 1 tablet by mouth daily.     metFORMIN 500 MG tablet  Commonly known as:  GLUCOPHAGE  Take 1,000 mg by mouth every evening.     montelukast 10 MG tablet  Commonly known as:  SINGULAIR  Take 10 mg by mouth at bedtime.     omeprazole 40 MG capsule  Commonly known as:  PRILOSEC  Take 40 mg by mouth daily.     rosuvastatin 10 MG tablet  Commonly known as:  CRESTOR  Take 10 mg by mouth daily.     sertraline 50 MG tablet  Commonly known as:  ZOLOFT  Take 100 mg by mouth daily.        Disposition and follow-up:   [name] was discharged from [facility] in Stable condition.  At the hospital follow up visit please address:  Anemia: adherence to iron therapy, referral to general surgery for hemorrhoids if she has  additional bloody bowel movements  Repeat CBC in 6-8 weeks to assess improvement on iron therapy   Follow-up Appointments:     Follow-up Information    Follow up with Astrid Divine, MD. Schedule an appointment as soon as possible for a visit in 1 week.   Specialty:  Family Medicine   Why:  Call Dr. Valentina Lucks on Monday to set up an appointment for hospital follow up within 1-2 weeks.   Contact information:   [address] [place] [address] [phone]       Follow up with [facility].   Why:  home health physical and occupational therapy   Contact information:   [address] [place] [address] [phone]       Discharge Instructions: Discharge Instructions    Call MD for:  extreme fatigue    Complete by:  As directed      Call MD for:  persistant dizziness or light-headedness    Complete by:  As directed      Call MD for:  persistant nausea and vomiting    Complete by:  As directed      Diet Carb Modified    Complete by:  As directed      Increase activity slowly    Complete  by:  As directed            Consultations: Treatment Team:  Jeani Hawking, MD  Procedures Performed:  No results found.  Admission HPI: Patient is a 50 yo F with a PMHx of hemmorhoids, cecal AVM, diverticulosis, asthma, OSA, chronic respiratory failure 2/2 restrictive lung disease on home oxygen and chronic nocturnal Bipap, morbid obesity, anxiety, mental retardation presenting to the hospital with a chief complaint of weakness. Patient was accompanied by her caregiver who provided the history. Caregiver states patient has been losing blood per rectum for the past 2 years. States this has been happening intermittently and she notices massive amounts of bright red blood in the toilet and around it as if is was "sprayed." Also states patient has been having dark stools on occasion and chronic loose stools which are green, foul smelling.  Patient reports having rectal pain with defecation. However, denies having any abdominal pain. She is currently taking Omeprazole 40 mg daily but does not use any medication for hemorrhoids. Denies any NSAID use. Denies taking any blood thinners. Caregiver states patient has chronic loose stools but no recent antibiotic use. States patient went to her primary care physician about a month ago and was told her hemoglobin was 9.3 at the time. As per caregiver, patient has been taking iron tablets every day. States patient has been feeling tired, dizzy, and falling for the past 1-2 weeks. Patient denies having any injuries from those falls. Caregiver states patient is chronically short of breath and currently on 4 L home oxygen but does not use her BiPAP machine at night. Patient denies having any shortness of breath is present. No other complaints.    As per [facility] records, patient has an EGD and colonoscopy done back in August 2014. EGD at the time showing hiatal hernia but no source of anemia was identified. No evidence of ulceration, erosion, masses, or vascular abnormalities noted. Colonoscopy showing nonbleeding cecal AVM status post APC, scattered diverticula, internal and external hemorrhoids. Hgb checked upon admission today was 6.0 and FOBT+. Patient was transfused with 2 units of packed red blood cells in the emergency room.  Hospital Course by problem list:   Symptomatic Anemia:  Likely 2/2 lower GI bleed. Patient was admitted to [facility] for symptomatic anemia in the setting of chronic intermittent lower GI bleed.  Her hemoglobin was 6.0 upon admission, down from baseline hemoglobin was 8.3 back in 2014.  She was given 2 unit of packed red blood cells and fereheme upon admission; Hb remained stable at 8 until discharge. Patient had FOBT+ upon admission.  GI was consulted and patient underwent colonoscopy with Dr. Jeani Hawking on 07/31/15.  On colonoscopy, an erythematous patch was seen that  was concerning for cecal AVM.  It was ablated with APC.  No acute bleeding was observed, however she was noted to have large hemorrhoids.  Per Dr. Haywood Pao note, if bleeding persists, Ms. Aramburo should follow up with surgery for management of her hemorrhoids. Upon discharge, patient to follow up with her primary care provider and notify medical personnel if she has any bloody bowel movements.    Chronic Respiratory Failure Secondary to Obesity Hypoventilation Syndrome and OSA: Patient wears BiPAP at home nightly.  Due to an episode of the patient being difficult to arouse after napping, BiPAP was made available nightly as well as intermittently during the day when napping.  Upon discharge, patient to continue BiPAP nightly. Consider adding BiPAP coverage for daytime napping if  patient is difficult to arouse once back in her assisted living facility.   Asthma: Patients home respiratory medications include albuterol 2.5 mg nebulized Q6H prn, budesonide 0.25 mg inhaled BID, and montelkast 10 mg PO daily.  All home medications were continued during her admission.  Upon discharge, she can continue to take her home medications listed above.   Non-insulin dependent diabetes: Patient takes metformin 1000 mg PO nightly at home for her diabetes.  During admission, patient was placed on sliding scale insulin for meal coverage.  Upon discharge, patient to restart home metformin as prescribed.  Anxiety/Depression: Patient takes sertraline 100 mg PO daily for her anxiety and depression.  During admission, her sertraline was continued at the same dose.    Discharge Vitals:   BP 131/76 mmHg  Pulse 87  Temp(Src) 98.4 F (36.9 C) (Oral)  Resp 15  Ht 5\' 7"  (1.702 m)  Wt 265 lb 14 oz (120.6 kg)  BMI 41.63 kg/m2  SpO2 100%  Discharge Labs:  Results for orders placed or performed during the hospital encounter of 07/29/15 (from the past 24 hour(s))  BLOOD TRANSFUSION REPORT - SCANNED     Status: None   Collection  Time: 07/31/15  1:26 PM   Narrative   Ordered by an unspecified provider.  Glucose, capillary     Status: Abnormal   Collection Time: 07/31/15  5:03 PM  Result Value Ref Range   Glucose-Capillary 117 (H) 65 - 99 mg/dL   Comment 1 Capillary Specimen   Glucose, capillary     Status: Abnormal   Collection Time: 07/31/15  7:42 PM  Result Value Ref Range   Glucose-Capillary 114 (H) 65 - 99 mg/dL   Comment 1 Capillary Specimen     Signed: Beather Arbour, MD 08/02/2015, 8:09 PM    Services Ordered on Discharge: None Equipment Ordered on Discharge: None

## 2015-08-01 NOTE — Discharge Instructions (Signed)
You were hospitalized for symptomatic anemia. This means that your red blood cell level was low and this caused you to feel weak.  While you were in the hospital you were given blood (2 units of packed red blood cells) and iron to help you feel better.  You were also seen by a GI doctor to look at your colon.  The bleeding was not coming from your colon.    If you do have more blood in your stool, please let your primary care provider know.  The GI doctor suggested that a surgeon should evaluate your hemmoroids if you continue to have more bleeding in your stool.   ___________________________________________  Anemia, Nonspecific Anemia is a condition in which the concentration of red blood cells or hemoglobin in the blood is below normal. Hemoglobin is a substance in red blood cells that carries oxygen to the tissues of the body. Anemia results in not enough oxygen reaching these tissues.  CAUSES  Common causes of anemia include:   Excessive bleeding. Bleeding may be internal or external. This includes excessive bleeding from periods (in women) or from the intestine.   Poor nutrition.   Chronic kidney, thyroid, and liver disease.  Bone marrow disorders that decrease red blood cell production.  Cancer and treatments for cancer.  HIV, AIDS, and their treatments.  Spleen problems that increase red blood cell destruction.  Blood disorders.  Excess destruction of red blood cells due to infection, medicines, and autoimmune disorders. SIGNS AND SYMPTOMS   Minor weakness.   Dizziness.   Headache.  Palpitations.   Shortness of breath, especially with exercise.   Paleness.  Cold sensitivity.  Indigestion.  Nausea.  Difficulty sleeping.  Difficulty concentrating. Symptoms may occur suddenly or they may develop slowly.  DIAGNOSIS  Additional blood tests are often needed. These help your health care provider determine the best treatment. Your health care provider will  check your stool for blood and look for other causes of blood loss.  TREATMENT  Treatment varies depending on the cause of the anemia. Treatment can include:   Supplements of iron, vitamin B12, or folic acid.   Hormone medicines.   A blood transfusion. This may be needed if blood loss is severe.   Hospitalization. This may be needed if there is significant continual blood loss.   Dietary changes.  Spleen removal. HOME CARE INSTRUCTIONS Keep all follow-up appointments. It often takes many weeks to correct anemia, and having your health care provider check on your condition and your response to treatment is very important. SEEK IMMEDIATE MEDICAL CARE IF:   You develop extreme weakness, shortness of breath, or chest pain.   You become dizzy or have trouble concentrating.  You develop heavy vaginal bleeding.   You develop a rash.   You have bloody or black, tarry stools.   You faint.   You vomit up blood.   You vomit repeatedly.   You have abdominal pain.  You have a fever or persistent symptoms for more than 2-3 days.   You have a fever and your symptoms suddenly get worse.   You are dehydrated.  MAKE SURE YOU:  Understand these instructions.  Will watch your condition.  Will get help right away if you are not doing well or get worse.   This information is not intended to replace advice given to you by your health care provider. Make sure you discuss any questions you have with your health care provider.   Document Released: 07/07/2004 Document  Revised: 01/30/2013 Document Reviewed: 11/23/2012 Elsevier Interactive Patient Education [employer]

## 2015-08-03 ENCOUNTER — Encounter (HOSPITAL_COMMUNITY): Payer: Self-pay | Admitting: Gastroenterology

## 2015-08-07 ENCOUNTER — Inpatient Hospital Stay: Payer: Self-pay | Admitting: Acute Care

## 2015-08-17 ENCOUNTER — Ambulatory Visit (INDEPENDENT_AMBULATORY_CARE_PROVIDER_SITE_OTHER): Payer: Medicare Other | Admitting: Pulmonary Disease

## 2015-08-17 ENCOUNTER — Encounter: Payer: Self-pay | Admitting: Pulmonary Disease

## 2015-08-17 VITALS — BP 110/80 | HR 90

## 2015-08-17 DIAGNOSIS — E662 Morbid (severe) obesity with alveolar hypoventilation: Secondary | ICD-10-CM

## 2015-08-17 DIAGNOSIS — J45998 Other asthma: Secondary | ICD-10-CM

## 2015-08-17 DIAGNOSIS — G4733 Obstructive sleep apnea (adult) (pediatric): Secondary | ICD-10-CM | POA: Diagnosis not present

## 2015-08-17 DIAGNOSIS — J9610 Chronic respiratory failure, unspecified whether with hypoxia or hypercapnia: Secondary | ICD-10-CM | POA: Diagnosis not present

## 2015-08-17 NOTE — Progress Notes (Signed)
Current Outpatient Prescriptions on File Prior to Visit  Medication Sig  . albuterol (PROVENTIL) (2.5 MG/3ML) 0.083% nebulizer solution Take 3 mLs (2.5 mg total) by nebulization every 6 (six) hours as needed for wheezing or shortness of breath.  . budesonide (PULMICORT) 0.25 MG/2ML nebulizer solution INHALE 1 VIAL VIA NEBULIZER TWICE DAILY.  . fenofibrate 160 MG tablet Take 160 mg by mouth daily.  Marland Kitchen FERREX 150 150 MG capsule Take 150 mg by mouth daily.  Marland Kitchen loratadine (CLARITIN) 10 MG tablet Take 10 mg by mouth daily as needed. For allergies  . losartan-hydrochlorothiazide (HYZAAR) 100-25 MG per tablet Take 1 tablet by mouth daily.  . metFORMIN (GLUCOPHAGE) 500 MG tablet Take 1,000 mg by mouth every evening.   . montelukast (SINGULAIR) 10 MG tablet Take 10 mg by mouth at bedtime.  Marland Kitchen omeprazole (PRILOSEC) 40 MG capsule Take 40 mg by mouth daily.   . rosuvastatin (CRESTOR) 10 MG tablet Take 10 mg by mouth daily.  . sertraline (ZOLOFT) 50 MG tablet Take 100 mg by mouth daily.    No current facility-administered medications on file prior to visit.    Chief Complaint  Patient presents with  . Follow-up    Pt has not used BiPAP machine - discuss mask change. Using 3-4L O2. DME: [facility]    Tests: PSG 09/13/10>>AHI 98.6, SpO2 60% ONO with CPAP and 3 liters 01/13/12>>Test time 7 hrs 6 min. Mean SpO2 89%, low SpO2 62%. Spent 2 hrs 34 min with SpO2 < 88% CPAP 12/16/11 to 01/14/12>>Used on 6 of 30 nights with average 7 hrs 3 min. Average AHI 1.8 with CPAP 10 cm H2O. ONO with CPAP and 3 liters 01/13/12 >> Test time 7 hrs 6 min.  Mean SpO2 89%, low SpO2 62%.  Spent 2 hrs 34 min with SpO2 < 88%. ONO with CPAP and 4 liters 01/31/12 >> Test time 7 hrs 36 min.  Mean SpO2 91.6%, low SpO2 73%.  Spent 56 min with SpO2 < 88%. BPAP 02/20/12>>17/11 cm H2O with 4 liters oxygen.  Past medical history DM, HTN, HLD, Anxiety, GERD, HH, Developmental delay  PSHx, Medications, Allergies, Fhx, Shx reviewed.  Vital  signs BP 110/80 mmHg  Pulse 90  SpO2 100%  History of Present Illness: Shelley [name] is a 50 y.o. female with OSA, OHS, and asthma.  She hasn't been using BiPAP >> mask didn't fit.  She has full face mask >> liked nasal pillow mask more.  She uses 4 liters oxygen 24/7.  Her pulse ox stays up at rest, and she wants to know if she can use less oxygen with rest.  She uses pumlicort, albuterol bid.  She uses singulair at night . She feels these help.  She is not having cough, wheeze, sputum, or chest pain.   Physical Exam: General - No distress, wearing oxygen ENT - No sinus tenderness, MP 4, no oral exudate, no LAN Cardiac - s1s2 regular, no murmur Chest - decreased breath sounds, no wheeze/rales Back - no focal tenderness Abd - soft, non-tender Ext - no edema Neuro - normal strength Skin - no rashes Psych - pleasant demeanor  Assessment/Plan:  Obstructive sleep apnea. Plan: - will arrange for BiPAP mask refitting  Chronic respiratory failure with hypoxia from OHS. Plan: - she is to continue 4 liters oxygen at night with BiPAP - she can adjust oxygen during the day based on her pulse ox reading >> advised that she should keep her SpO2 > 90%  Asthma. Plan: -  advised her to use pulmicort BID, and singulair qhs - she can use prn albuterol >> discussed when she should use albuterol  Morbid obesity. Plan: - discussed importance of weight loss   Patient Instructions  Will arrange for new BiPAP mask  Follow up in 6 months    Shelley HellingVineet Telma Pyeatt, MD [place] [facility] Pager:  [phone] 08/17/2015, 10:47 AM

## 2015-08-17 NOTE — Patient Instructions (Signed)
Will arrange for new BiPAP mask  Follow up in 6 months

## 2015-09-12 DIAGNOSIS — K922 Gastrointestinal hemorrhage, unspecified: Secondary | ICD-10-CM

## 2015-09-12 HISTORY — DX: Gastrointestinal hemorrhage, unspecified: K92.2

## 2015-09-16 ENCOUNTER — Encounter (HOSPITAL_COMMUNITY): Payer: Self-pay | Admitting: Family Medicine

## 2015-09-16 ENCOUNTER — Inpatient Hospital Stay (HOSPITAL_COMMUNITY)
Admission: EM | Admit: 2015-09-16 | Discharge: 2015-09-22 | DRG: 393 | Disposition: A | Discharge status: Skilled Nursing Facility | Payer: Medicare Other | Attending: Internal Medicine | Admitting: Internal Medicine

## 2015-09-16 DIAGNOSIS — Z825 Family history of asthma and other chronic lower respiratory diseases: Secondary | ICD-10-CM

## 2015-09-16 DIAGNOSIS — D72819 Decreased white blood cell count, unspecified: Secondary | ICD-10-CM | POA: Diagnosis present

## 2015-09-16 DIAGNOSIS — Z91013 Allergy to seafood: Secondary | ICD-10-CM

## 2015-09-16 DIAGNOSIS — Z7951 Long term (current) use of inhaled steroids: Secondary | ICD-10-CM | POA: Diagnosis not present

## 2015-09-16 DIAGNOSIS — Z7984 Long term (current) use of oral hypoglycemic drugs: Secondary | ICD-10-CM

## 2015-09-16 DIAGNOSIS — K2970 Gastritis, unspecified, without bleeding: Secondary | ICD-10-CM | POA: Diagnosis present

## 2015-09-16 DIAGNOSIS — K625 Hemorrhage of anus and rectum: Secondary | ICD-10-CM

## 2015-09-16 DIAGNOSIS — G9340 Encephalopathy, unspecified: Secondary | ICD-10-CM | POA: Diagnosis not present

## 2015-09-16 DIAGNOSIS — K219 Gastro-esophageal reflux disease without esophagitis: Secondary | ICD-10-CM | POA: Diagnosis present

## 2015-09-16 DIAGNOSIS — F79 Unspecified intellectual disabilities: Secondary | ICD-10-CM | POA: Diagnosis present

## 2015-09-16 DIAGNOSIS — E119 Type 2 diabetes mellitus without complications: Secondary | ICD-10-CM | POA: Diagnosis present

## 2015-09-16 DIAGNOSIS — K648 Other hemorrhoids: Principal | ICD-10-CM | POA: Diagnosis present

## 2015-09-16 DIAGNOSIS — R400 Somnolence: Secondary | ICD-10-CM

## 2015-09-16 DIAGNOSIS — Z8679 Personal history of other diseases of the circulatory system: Secondary | ICD-10-CM | POA: Diagnosis not present

## 2015-09-16 DIAGNOSIS — J9621 Acute and chronic respiratory failure with hypoxia: Secondary | ICD-10-CM | POA: Diagnosis present

## 2015-09-16 DIAGNOSIS — I10 Essential (primary) hypertension: Secondary | ICD-10-CM | POA: Diagnosis present

## 2015-09-16 DIAGNOSIS — E785 Hyperlipidemia, unspecified: Secondary | ICD-10-CM | POA: Diagnosis present

## 2015-09-16 DIAGNOSIS — J45909 Unspecified asthma, uncomplicated: Secondary | ICD-10-CM | POA: Diagnosis present

## 2015-09-16 DIAGNOSIS — D62 Acute posthemorrhagic anemia: Secondary | ICD-10-CM | POA: Diagnosis not present

## 2015-09-16 DIAGNOSIS — Z8774 Personal history of (corrected) congenital malformations of heart and circulatory system: Secondary | ICD-10-CM

## 2015-09-16 DIAGNOSIS — Z79899 Other long term (current) drug therapy: Secondary | ICD-10-CM

## 2015-09-16 DIAGNOSIS — K921 Melena: Secondary | ICD-10-CM | POA: Diagnosis present

## 2015-09-16 DIAGNOSIS — J9622 Acute and chronic respiratory failure with hypercapnia: Secondary | ICD-10-CM | POA: Diagnosis present

## 2015-09-16 DIAGNOSIS — R5383 Other fatigue: Secondary | ICD-10-CM

## 2015-09-16 DIAGNOSIS — G9341 Metabolic encephalopathy: Secondary | ICD-10-CM | POA: Diagnosis not present

## 2015-09-16 DIAGNOSIS — K922 Gastrointestinal hemorrhage, unspecified: Secondary | ICD-10-CM

## 2015-09-16 DIAGNOSIS — E662 Morbid (severe) obesity with alveolar hypoventilation: Secondary | ICD-10-CM | POA: Diagnosis not present

## 2015-09-16 DIAGNOSIS — Z9989 Dependence on other enabling machines and devices: Secondary | ICD-10-CM | POA: Insufficient documentation

## 2015-09-16 DIAGNOSIS — Z6841 Body Mass Index (BMI) 40.0 and over, adult: Secondary | ICD-10-CM | POA: Diagnosis not present

## 2015-09-16 DIAGNOSIS — K649 Unspecified hemorrhoids: Secondary | ICD-10-CM | POA: Diagnosis not present

## 2015-09-16 DIAGNOSIS — Z888 Allergy status to other drugs, medicaments and biological substances status: Secondary | ICD-10-CM | POA: Diagnosis not present

## 2015-09-16 DIAGNOSIS — K31811 Angiodysplasia of stomach and duodenum with bleeding: Secondary | ICD-10-CM | POA: Diagnosis not present

## 2015-09-16 HISTORY — DX: Gastrointestinal hemorrhage, unspecified: K92.2

## 2015-09-16 LAB — COMPREHENSIVE METABOLIC PANEL
ALBUMIN: 3.3 g/dL — AB (ref 3.5–5.0)
ALK PHOS: 67 U/L (ref 38–126)
ALT: 17 U/L (ref 14–54)
AST: 24 U/L (ref 15–41)
Anion gap: 11 (ref 5–15)
BUN: 9 mg/dL (ref 6–20)
CO2: 33 mmol/L — ABNORMAL HIGH (ref 22–32)
CREATININE: 0.49 mg/dL (ref 0.44–1.00)
Calcium: 8.4 mg/dL — ABNORMAL LOW (ref 8.9–10.3)
Chloride: 93 mmol/L — ABNORMAL LOW (ref 101–111)
GFR calc Af Amer: >60 mL/min (ref >60)
GFR calc non Af Amer: >60 mL/min (ref >60)
GLUCOSE: 179 mg/dL — AB (ref 65–99)
Potassium: 3.5 mmol/L (ref 3.5–5.1)
SODIUM: 137 mmol/L (ref 135–145)
TOTAL PROTEIN: 7.3 g/dL (ref 6.5–8.1)
Total Bilirubin: 0.2 mg/dL — ABNORMAL LOW (ref 0.3–1.2)

## 2015-09-16 LAB — PREPARE RBC (CROSSMATCH)

## 2015-09-16 LAB — CBC
HEMATOCRIT: 21.1 % — AB (ref 36.0–46.0)
Hemoglobin: 5.4 g/dL — CL (ref 12.0–15.0)
MCH: 19.3 pg — ABNORMAL LOW (ref 26.0–34.0)
MCHC: 25.6 g/dL — AB (ref 30.0–36.0)
MCV: 75.4 fL — AB (ref 78.0–100.0)
PLATELETS: 212 10*3/uL (ref 150–400)
RBC: 2.8 MIL/uL — ABNORMAL LOW (ref 3.87–5.11)
RDW: 21.4 % — AB (ref 11.5–15.5)
WBC: 3.1 10*3/uL — ABNORMAL LOW (ref 4.0–10.5)

## 2015-09-16 LAB — POC OCCULT BLOOD, ED: Fecal Occult Bld: NEGATIVE

## 2015-09-16 MED ORDER — LORATADINE 10 MG PO TABS: 10.0000 mg | ORAL_TABLET | Freq: Every day | ORAL | Status: DC | PRN

## 2015-09-16 MED ORDER — PANTOPRAZOLE SODIUM 40 MG PO TBEC: 80.0000 mg | DELAYED_RELEASE_TABLET | Freq: Every day | ORAL | Status: DC

## 2015-09-16 MED ORDER — MONTELUKAST SODIUM 10 MG PO TABS
10.0000 mg | ORAL_TABLET | Freq: Every day | ORAL | Status: DC
  Administered 2015-09-16 – 2015-09-21 (×6): 10 mg via ORAL
  Filled 2015-09-16 (×6): qty 1

## 2015-09-16 MED ORDER — POLYSACCHARIDE IRON COMPLEX 150 MG PO CAPS
150.0000 mg | ORAL_CAPSULE | Freq: Every day | ORAL | Status: DC
  Administered 2015-09-17 – 2015-09-22 (×6): 150 mg via ORAL
  Filled 2015-09-16 (×8): qty 1

## 2015-09-16 MED ORDER — SODIUM CHLORIDE 0.9 % IV SOLN
10.0000 mL/h | Freq: Once | INTRAVENOUS | Status: AC
  Administered 2015-09-16: 10 mL/h via INTRAVENOUS

## 2015-09-16 MED ORDER — PANTOPRAZOLE SODIUM 40 MG PO TBEC
80.0000 mg | DELAYED_RELEASE_TABLET | Freq: Every day | ORAL | Status: DC
  Administered 2015-09-17 – 2015-09-22 (×6): 80 mg via ORAL
  Filled 2015-09-16 (×7): qty 2

## 2015-09-16 MED ORDER — BUDESONIDE 0.25 MG/2ML IN SUSP
0.2500 mg | Freq: Two times a day (BID) | RESPIRATORY_TRACT | Status: DC
  Administered 2015-09-17 – 2015-09-22 (×11): 0.25 mg via RESPIRATORY_TRACT
  Filled 2015-09-16 (×11): qty 2

## 2015-09-16 MED ORDER — ROSUVASTATIN CALCIUM 10 MG PO TABS
10.0000 mg | ORAL_TABLET | Freq: Every day | ORAL | Status: DC
  Administered 2015-09-17 – 2015-09-22 (×6): 10 mg via ORAL
  Filled 2015-09-16 (×6): qty 1

## 2015-09-16 MED ORDER — INSULIN ASPART 100 UNIT/ML ~~LOC~~ SOLN
0.0000 [IU] | SUBCUTANEOUS | Status: DC
  Administered 2015-09-16: 2 [IU] via SUBCUTANEOUS
  Administered 2015-09-17 (×2): 1 [IU] via SUBCUTANEOUS
  Administered 2015-09-17 (×4): 2 [IU] via SUBCUTANEOUS
  Administered 2015-09-18: 1 [IU] via SUBCUTANEOUS
  Administered 2015-09-18 (×2): 2 [IU] via SUBCUTANEOUS
  Administered 2015-09-18: 1 [IU] via SUBCUTANEOUS

## 2015-09-16 MED ORDER — FENOFIBRATE 160 MG PO TABS
160.0000 mg | ORAL_TABLET | Freq: Every day | ORAL | Status: DC
  Administered 2015-09-17 – 2015-09-22 (×6): 160 mg via ORAL
  Filled 2015-09-16 (×6): qty 1

## 2015-09-16 MED ORDER — ALBUTEROL SULFATE (2.5 MG/3ML) 0.083% IN NEBU: 2.5000 mg | INHALATION_SOLUTION | Freq: Four times a day (QID) | RESPIRATORY_TRACT | Status: DC | PRN

## 2015-09-16 MED ORDER — SERTRALINE HCL 100 MG PO TABS
100.0000 mg | ORAL_TABLET | Freq: Every day | ORAL | Status: DC
Start: 2015-09-16 — End: 2015-09-16

## 2015-09-16 MED ORDER — SERTRALINE HCL 100 MG PO TABS
100.0000 mg | ORAL_TABLET | Freq: Every day | ORAL | Status: DC
  Administered 2015-09-17 – 2015-09-22 (×6): 100 mg via ORAL
  Filled 2015-09-16 (×6): qty 1

## 2015-09-16 NOTE — ED Notes (Signed)
Reported 5.4 hgb to Dr. Bebe ShaggyWickline and nurse kim. Preparing to transfer patient to room.

## 2015-09-16 NOTE — ED Notes (Signed)
Pt here for low hgb at 5. sts sent by doctor. sts bleeding from rectum. sts she has been weak and falling.

## 2015-09-16 NOTE — ED Provider Notes (Signed)
CSN: [id number]     Arrival date & time 09/16/15  1824 History   First MD Initiated Contact with Patient 09/16/15 1958     Chief Complaint  Patient presents with  . low hgb      Patient is a 50 y.o. female presenting with hematochezia. The history is provided by the patient and a caregiver.  Rectal Bleeding Quality:  Bright red Amount:  Moderate Duration:  3 weeks Timing:  Intermittent Progression:  Worsening Chronicity:  Recurrent Relieved by:  None tried Worsened by:  Nothing tried Associated symptoms: dizziness   Associated symptoms: no abdominal pain, no hematemesis and no vomiting   Patient with h/o asthma, Diabetes, GERD, h/o hemorrhoids and AVM presents with rectal bleeding intermittently for past 3 weeks.  It is worsening No vomiting She reports feeling weakness No syncope No abd pain at this time She is not on anticoagulants She has been admitted previously for GI bleed and required blood transfusion  Past Medical History  Diagnosis Date  . Asthma   . Diabetes mellitus   . Hypertension   . Hyperlipidemia   . Pneumonia   . Anxiety   . GERD (gastroesophageal reflux disease)   . Arthritis   . Mental retardation   . Hiatal hernia   . AVM (arteriovenous malformation) of colon   . Hemorrhoids     Internal and external  . Diverticulosis    Past Surgical History  Procedure Laterality Date  . Cholecystectomy      2006  . Eye surgery    . Colonoscopy N/A 02/06/2013    Procedure: COLONOSCOPY;  Surgeon: Theda Belfast, MD;  Location: [facility] ENDOSCOPY;  Service: Endoscopy;  Laterality: N/A;  . Esophagogastroduodenoscopy N/A 02/06/2013    Procedure: ESOPHAGOGASTRODUODENOSCOPY (EGD);  Surgeon: Theda Belfast, MD;  Location: [facility];  Service: Endoscopy;  Laterality: N/A;  . Colonoscopy N/A 07/31/2015    Procedure: COLONOSCOPY;  Surgeon: Jeani Hawking, MD;  Location: [facility] ENDOSCOPY;  Service: Endoscopy;  Laterality: N/A;  . Hot hemostasis N/A 07/31/2015    Procedure: HOT  HEMOSTASIS (ARGON PLASMA COAGULATION/BICAP);  Surgeon: Jeani Hawking, MD;  Location: [facility] ENDOSCOPY;  Service: Endoscopy;  Laterality: N/A;   Family History  Problem Relation Age of Onset  . COPD Mother    Social History  Substance Use Topics  . Smoking status: Never Smoker   . Smokeless tobacco: Never Used  . Alcohol Use: No   OB History    No data available     Review of Systems  Constitutional: Positive for fatigue.  Gastrointestinal: Positive for blood in stool and hematochezia. Negative for vomiting, abdominal pain and hematemesis.  Genitourinary: Negative for vaginal bleeding.  Neurological: Positive for dizziness. Negative for syncope.  All other systems reviewed and are negative.     Allergies  Fish allergy; Pepto-bismol; Cortisone; and Trichophyton  Home Medications   Prior to Admission medications   Medication Sig Start Date End Date Taking? Authorizing Provider  albuterol (PROVENTIL) (2.5 MG/3ML) 0.083% nebulizer solution Take 3 mLs (2.5 mg total) by nebulization every 6 (six) hours as needed for wheezing or shortness of breath. 06/04/15   [name], MD  budesonide (PULMICORT) 0.25 MG/2ML nebulizer solution INHALE 1 VIAL VIA NEBULIZER TWICE DAILY. 06/04/15   [name], MD  fenofibrate 160 MG tablet Take 160 mg by mouth daily.    Historical Provider, MD  FERREX 150 150 MG capsule Take 150 mg by mouth daily. 07/22/15   Historical Provider, MD  loratadine (CLARITIN) 10  MG tablet Take 10 mg by mouth daily as needed. For allergies    Historical Provider, MD  losartan-hydrochlorothiazide (HYZAAR) 100-25 MG per tablet Take 1 tablet by mouth daily.    Historical Provider, MD  metFORMIN (GLUCOPHAGE) 500 MG tablet Take 1,000 mg by mouth every evening.     Historical Provider, MD  montelukast (SINGULAIR) 10 MG tablet Take 10 mg by mouth at bedtime.    Historical Provider, MD  omeprazole (PRILOSEC) 40 MG capsule Take 40 mg by mouth daily.  03/04/13   Historical Provider, MD   rosuvastatin (CRESTOR) 10 MG tablet Take 10 mg by mouth daily.    Historical Provider, MD  sertraline (ZOLOFT) 50 MG tablet Take 100 mg by mouth daily.     Historical Provider, MD   BP 127/63 mmHg  Pulse 99  Temp(Src) 99.4 F (37.4 C) (Oral)  Resp 23  SpO2 100% Physical Exam CONSTITUTIONAL: Well developed/well nourished HEAD: Normocephalic/atraumatic EYES: EOMI/PERRL, conjunctiva pale ENMT: Mucous membranes moist NECK: supple no meningeal signs SPINE/BACK:entire spine nontender CV: S1/S2 noted, no murmurs/rubs/gallops noted LUNGS: Lungs are clear to auscultation bilaterally, no apparent distress ABDOMEN: soft, nontender Rectal - no stool in vault, nurse jazmine present for exam GU:no cva tenderness NEURO: Pt is awake/alert/appropriate, moves all extremitiesx4.  No facial droop.   EXTREMITIES: pulses normal/equal, full ROM SKIN: pale PSYCH: no abnormalities of mood noted, alert and oriented to situation  ED Course  Procedures  CRITICAL CARE Performed by: Joya Gaskins Total critical care time: 31 minutes Critical care time was exclusive of separately billable procedures and treating other patients. Critical care was necessary to treat or prevent imminent or life-threatening deterioration. Critical care was time spent personally by me on the following activities: development of treatment plan with patient and/or surrogate as well as nursing, discussions with consultants, evaluation of patient's response to treatment, examination of patient, obtaining history from patient or surrogate, ordering and performing treatments and interventions, ordering and review of laboratory studies, ordering and review of radiographic studies, pulse oximetry and re-evaluation of patient's condition. PATIENT WITH ANEMIA, HEMOGLOBIN AT 5.4 REQUIRING ADMISSION AND BLOOD TRANSFUSION PATIENT STABILIZED IN THE ER  9:00 PM Pt with h/o GI bleed previously, now with worsening rectal bleed for past 3  weeks She is pale She is anemic, HGB at 5.4 She will need admitted She will need blood transfusion Patient/caregiver agreeable for blood transfusion at this time 9:14 PM Discussed with dr gardner for admission He will see patient Per chart, pt has h/o GI bleed, last colonoscopy 2 months ago reveal AVM/hemorrhoids Also recommend surgical evaluation  Labs Review Labs Reviewed  COMPREHENSIVE METABOLIC PANEL - Abnormal; Notable for the following:    Chloride 93 (*)    CO2 33 (*)    Glucose, Bld 179 (*)    Calcium 8.4 (*)    Albumin 3.3 (*)    Total Bilirubin 0.2 (*)    All other components within normal limits  CBC - Abnormal; Notable for the following:    WBC 3.1 (*)    RBC 2.80 (*)    Hemoglobin 5.4 (*)    HCT 21.1 (*)    MCV 75.4 (*)    MCH 19.3 (*)    MCHC 25.6 (*)    RDW 21.4 (*)    All other components within normal limits  POC OCCULT BLOOD, ED  POC OCCULT BLOOD, ED  TYPE AND SCREEN  PREPARE RBC (CROSSMATCH)    I have personally reviewed and evaluated these lab results  as part of my medical decision-making.    MDM   Final diagnoses:  Gastrointestinal hemorrhage, unspecified gastritis, unspecified gastrointestinal hemorrhage type  Acute blood loss anemia    Nursing notes including past medical history and social history reviewed and considered in documentation Labs/vital reviewed myself and considered during evaluation Previous records reviewed and considered      Zadie Rhineonald Nalah Macioce, MD 09/16/15 2116

## 2015-09-16 NOTE — H&P (Signed)
[facility] History and Physical  Shelley [name] IHK:[id number] DOB: 1966-04-04 DOA: 09/16/2015  Referring physician: EDP PCP: Astrid Divine, MD   Chief Complaint: BRBPR   HPI: Shelley [name] is a 50 y.o. female who presents to the ED with 3 week history of intermittent BRBPR.  Patient has history of the same in the past.  She was recently admitted in Feb, had colonoscopy which showed a questionable cecal AVM that was injected, but bleeding was believed to be more due to internal hemorrhoids.  Patient does have a history of severe internal hemorrhoids including blood loss anemia due to internal hemorrohoids in the past.  Patient required surgery to band the internal hemorrohoids due to blood loss anemia back in 2014.  This had worked well until [employer] of this year.  Review of Systems: Systems reviewed.  As above, otherwise negative  Past Medical History  Diagnosis Date  . Asthma   . Diabetes mellitus   . Hypertension   . Hyperlipidemia   . Pneumonia   . Anxiety   . GERD (gastroesophageal reflux disease)   . Arthritis   . Mental retardation   . Hiatal hernia   . AVM (arteriovenous malformation) of colon   . Hemorrhoids     Internal and external  . Diverticulosis    Past Surgical History  Procedure Laterality Date  . Cholecystectomy      2006  . Eye surgery    . Colonoscopy N/A 02/06/2013    Procedure: COLONOSCOPY;  Surgeon: Theda Belfast, MD;  Location: [facility] ENDOSCOPY;  Service: Endoscopy;  Laterality: N/A;  . Esophagogastroduodenoscopy N/A 02/06/2013    Procedure: ESOPHAGOGASTRODUODENOSCOPY (EGD);  Surgeon: Theda Belfast, MD;  Location: [facility]COPY;  Service: Endoscopy;  Laterality: N/A;  . Colonoscopy N/A 07/31/2015    Procedure: COLONOSCOPY;  Surgeon: Jeani Hawking, MD;  Location: [facility] ENDOSCOPY;  Service: Endoscopy;  Laterality: N/A;  . Hot hemostasis N/A 07/31/2015    Procedure: HOT HEMOSTASIS (ARGON PLASMA COAGULATION/BICAP);  Surgeon: Jeani Hawking, MD;   Location: [facility] ENDOSCOPY;  Service: Endoscopy;  Laterality: N/A;   Social History:  reports that she has never smoked. She has never used smokeless tobacco. She reports that she does not drink alcohol or use illicit drugs.  Allergies  Allergen Reactions  . Fish Allergy Swelling  . Pepto-Bismol [Bismuth Subsalicylate] Nausea And Vomiting  . Cortisone Itching and Rash    Injections and cream only.  PO ok.  . Trichophyton Itching    Also sneezing accompanying as well    Family History  Problem Relation Age of Onset  . COPD Mother      Prior to Admission medications   Medication Sig Start Date End Date Taking? Authorizing Provider  albuterol (PROVENTIL) (2.5 MG/3ML) 0.083% nebulizer solution Take 3 mLs (2.5 mg total) by nebulization every 6 (six) hours as needed for wheezing or shortness of breath. 06/04/15   [name], MD  budesonide (PULMICORT) 0.25 MG/2ML nebulizer solution INHALE 1 VIAL VIA NEBULIZER TWICE DAILY. 06/04/15   [name], MD  fenofibrate 160 MG tablet Take 160 mg by mouth daily.    Historical Provider, MD  FERREX 150 150 MG capsule Take 150 mg by mouth daily. 07/22/15   Historical Provider, MD  loratadine (CLARITIN) 10 MG tablet Take 10 mg by mouth daily as needed. For allergies    Historical Provider, MD  losartan-hydrochlorothiazide (HYZAAR) 100-25 MG per tablet Take 1 tablet by mouth daily.    Historical Provider, MD  metFORMIN (GLUCOPHAGE) 500  MG tablet Take 1,000 mg by mouth every evening.     Historical Provider, MD  montelukast (SINGULAIR) 10 MG tablet Take 10 mg by mouth at bedtime.    Historical Provider, MD  omeprazole (PRILOSEC) 40 MG capsule Take 40 mg by mouth daily.  03/04/13   Historical Provider, MD  rosuvastatin (CRESTOR) 10 MG tablet Take 10 mg by mouth daily.    Historical Provider, MD  sertraline (ZOLOFT) 50 MG tablet Take 100 mg by mouth daily.     Historical Provider, MD   Physical Exam: Filed Vitals:   09/16/15 2045 09/16/15 2100  BP: 118/65  116/65  Pulse: 100 95  Temp:    Resp: 14 20    BP 116/65 mmHg  Pulse 95  Temp(Src) 99.4 F (37.4 C) (Oral)  Resp 20  SpO2 100%  General Appearance:    Alert, oriented, no distress, appears stated age  Head:    Normocephalic, atraumatic  Eyes:    PERRL, EOMI, sclera non-icteric        Nose:   Nares without drainage or epistaxis. Mucosa, turbinates normal  Throat:   Moist mucous membranes. Oropharynx without erythema or exudate.  Neck:   Supple. No carotid bruits.  No thyromegaly.  No lymphadenopathy.   Back:     No CVA tenderness, no spinal tenderness  Lungs:     Clear to auscultation bilaterally, without wheezes, rhonchi or rales  Chest wall:    No tenderness to palpitation  Heart:    Regular rate and rhythm without murmurs, gallops, rubs  Abdomen:     Soft, non-tender, nondistended, normal bowel sounds, no organomegaly  Genitalia:    deferred  Rectal:    deferred  Extremities:   No clubbing, cyanosis or edema.  Pulses:   2+ and symmetric all extremities  Skin:   Skin color, texture, turgor normal, no rashes or lesions  Lymph nodes:   Cervical, supraclavicular, and axillary nodes normal  Neurologic:   CNII-XII intact. Normal strength, sensation and reflexes      throughout    Labs on Admission:  Basic Metabolic Panel:  Recent Labs Lab 09/16/15 1914  NA 137  K 3.5  CL 93*  CO2 33*  GLUCOSE 179*  BUN 9  CREATININE 0.49  CALCIUM 8.4*   Liver Function Tests:  Recent Labs Lab 09/16/15 1914  AST 24  ALT 17  ALKPHOS 67  BILITOT 0.2*  PROT 7.3  ALBUMIN 3.3*   No results for input(s): LIPASE, AMYLASE in the last 168 hours. No results for input(s): AMMONIA in the last 168 hours. CBC:  Recent Labs Lab 09/16/15 1914  WBC 3.1*  HGB 5.4*  HCT 21.1*  MCV 75.4*  PLT 212   Cardiac Enzymes: No results for input(s): CKTOTAL, CKMB, CKMBINDEX, TROPONINI in the last 168 hours.  BNP (last 3 results) No results for input(s): PROBNP in the last 8760  hours. CBG: No results for input(s): GLUCAP in the last 168 hours.  Radiological Exams on Admission: No results found.  EKG: Independently reviewed.  Assessment/Plan Principal Problem:   Lower GI bleed Active Problems:   Hemorrhoids   History of arteriovenous malformation (AVM)   1. Lower GIB - 1. Needs surgery evaluation for likely hemorrhoid source 2. See also colonoscopy note from feb of this year 3. SCDs only for DVT ppx 2. Acute blood loss anemia - 1. Transfuse 2 units 2. Recheck CBC in AM 3. Tele monitor    Code Status: Full  Family Communication: No  family in room Disposition Plan: Admit to inpatient   Time spent: 70 min  GARDNER, JARED M. [facility] Pager [phone]  If 7AM-7PM, please contact the day team taking care of the patient [email or web address] Password [email or web address] 09/16/2015, 9:36 PM

## 2015-09-17 ENCOUNTER — Encounter (HOSPITAL_COMMUNITY): Payer: Self-pay | Admitting: Anesthesiology

## 2015-09-17 ENCOUNTER — Encounter (HOSPITAL_COMMUNITY): Payer: Self-pay | Admitting: General Surgery

## 2015-09-17 ENCOUNTER — Inpatient Hospital Stay (HOSPITAL_COMMUNITY): Payer: Medicare Other

## 2015-09-17 DIAGNOSIS — K31811 Angiodysplasia of stomach and duodenum with bleeding: Secondary | ICD-10-CM

## 2015-09-17 LAB — COMPREHENSIVE METABOLIC PANEL
ALT: 18 U/L (ref 14–54)
AST: 23 U/L (ref 15–41)
Albumin: 3 g/dL — ABNORMAL LOW (ref 3.5–5.0)
Alkaline Phosphatase: 64 U/L (ref 38–126)
Anion gap: 8 (ref 5–15)
BILIRUBIN TOTAL: 0.9 mg/dL (ref 0.3–1.2)
BUN: 6 mg/dL (ref 6–20)
CHLORIDE: 96 mmol/L — AB (ref 101–111)
CO2: 40 mmol/L — ABNORMAL HIGH (ref 22–32)
CREATININE: 0.49 mg/dL (ref 0.44–1.00)
Calcium: 8.7 mg/dL — ABNORMAL LOW (ref 8.9–10.3)
Glucose, Bld: 123 mg/dL — ABNORMAL HIGH (ref 65–99)
Potassium: 3.9 mmol/L (ref 3.5–5.1)
Sodium: 144 mmol/L (ref 135–145)
TOTAL PROTEIN: 7 g/dL (ref 6.5–8.1)

## 2015-09-17 LAB — CBC
HCT: 24.5 % — ABNORMAL LOW (ref 36.0–46.0)
HEMATOCRIT: 29.8 % — AB (ref 36.0–46.0)
HEMOGLOBIN: 6.9 g/dL — AB (ref 12.0–15.0)
HEMOGLOBIN: 8.8 g/dL — AB (ref 12.0–15.0)
MCH: 22 pg — AB (ref 26.0–34.0)
MCH: 23.7 pg — AB (ref 26.0–34.0)
MCHC: 28.2 g/dL — AB (ref 30.0–36.0)
MCHC: 29.5 g/dL — AB (ref 30.0–36.0)
MCV: 78.3 fL (ref 78.0–100.0)
MCV: 80.3 fL (ref 78.0–100.0)
PLATELETS: 168 10*3/uL (ref 150–400)
Platelets: 164 10*3/uL (ref 150–400)
RBC: 3.13 MIL/uL — ABNORMAL LOW (ref 3.87–5.11)
RBC: 3.71 MIL/uL — ABNORMAL LOW (ref 3.87–5.11)
RDW: 20.5 % — AB (ref 11.5–15.5)
RDW: 19.7 % — ABNORMAL HIGH (ref 11.5–15.5)
WBC: 2.6 10*3/uL — ABNORMAL LOW (ref 4.0–10.5)
WBC: 2.8 10*3/uL — ABNORMAL LOW (ref 4.0–10.5)

## 2015-09-17 LAB — BASIC METABOLIC PANEL
Anion gap: 9 (ref 5–15)
BUN: 9 mg/dL (ref 6–20)
CALCIUM: 8.2 mg/dL — AB (ref 8.9–10.3)
CHLORIDE: 95 mmol/L — AB (ref 101–111)
CO2: 38 mmol/L — ABNORMAL HIGH (ref 22–32)
CREATININE: 0.57 mg/dL (ref 0.44–1.00)
Glucose, Bld: 186 mg/dL — ABNORMAL HIGH (ref 65–99)
Potassium: 3.7 mmol/L (ref 3.5–5.1)
SODIUM: 142 mmol/L (ref 135–145)

## 2015-09-17 LAB — BLOOD GAS, ARTERIAL
ACID-BASE EXCESS: 16.4 mmol/L — AB (ref 0.0–2.0)
Acid-Base Excess: 14.9 mmol/L — ABNORMAL HIGH (ref 0.0–2.0)
Bicarbonate: 41.5 mEq/L — ABNORMAL HIGH (ref 20.0–24.0)
Bicarbonate: 43.2 mEq/L — ABNORMAL HIGH (ref 20.0–24.0)
DRAWN BY: 441261
Delivery systems: POSITIVE
Drawn by: 246861
Expiratory PAP: 11
Inspiratory PAP: 17
O2 CONTENT: 4 L/min
O2 Content: 2 L/min
O2 SAT: 99 %
O2 Saturation: 96.4 %
PCO2 ART: 83.2 mmHg — AB (ref 35.0–45.0)
PCO2 ART: 86.7 mmHg — AB (ref 35.0–45.0)
PH ART: 7.319 — AB (ref 7.350–7.450)
PH ART: 7.318 — AB (ref 7.350–7.450)
PO2 ART: 86 mmHg (ref 80.0–100.0)
PO2 ART: 141 mmHg — AB (ref 80.0–100.0)
Patient temperature: 98.6
Patient temperature: 98.6
TCO2: 44.1 mmol/L (ref 0–100)
TCO2: 45.8 mmol/L (ref 0–100)

## 2015-09-17 LAB — GLUCOSE, CAPILLARY
GLUCOSE-CAPILLARY: 149 mg/dL — AB (ref 65–99)
GLUCOSE-CAPILLARY: 178 mg/dL — AB (ref 65–99)
Glucose-Capillary: 184 mg/dL — ABNORMAL HIGH (ref 65–99)
Glucose-Capillary: 186 mg/dL — ABNORMAL HIGH (ref 65–99)
Glucose-Capillary: 129 mg/dL — ABNORMAL HIGH (ref 65–99)
Glucose-Capillary: 162 mg/dL — ABNORMAL HIGH (ref 65–99)
Glucose-Capillary: 123 mg/dL — ABNORMAL HIGH (ref 65–99)

## 2015-09-17 LAB — TROPONIN I

## 2015-09-17 LAB — POCT I-STAT 3, ART BLOOD GAS (G3+)
Acid-Base Excess: 16 mmol/L — ABNORMAL HIGH (ref 0.0–2.0)
Bicarbonate: 45.7 mEq/L — ABNORMAL HIGH (ref 20.0–24.0)
O2 SAT: 97 %
PCO2 ART: 93.2 mmHg — AB (ref 35.0–45.0)
PO2 ART: 109 mmHg — AB (ref 80.0–100.0)
Patient temperature: 98.9
TCO2: 48 mmol/L (ref 0–100)
pH, Arterial: 7.299 — ABNORMAL LOW (ref 7.350–7.450)

## 2015-09-17 LAB — LACTIC ACID, PLASMA
LACTIC ACID, VENOUS: 1.2 mmol/L (ref 0.5–2.0)
LACTIC ACID, VENOUS: 0.6 mmol/L (ref 0.5–2.0)

## 2015-09-17 LAB — PREPARE RBC (CROSSMATCH)

## 2015-09-17 MED ORDER — PEG 3350-KCL-NA BICARB-NACL 420 G PO SOLR
4000.0000 mL | Freq: Once | ORAL | Status: DC
  Filled 2015-09-17: qty 4000

## 2015-09-17 MED ORDER — SODIUM CHLORIDE 0.9 % IV SOLN
INTRAVENOUS | Status: DC
  Administered 2015-09-18: via INTRAVENOUS

## 2015-09-17 MED ORDER — SODIUM CHLORIDE 0.9 % IV SOLN
Freq: Once | INTRAVENOUS | Status: AC
  Administered 2015-09-17: 11:00:00 via INTRAVENOUS

## 2015-09-17 NOTE — Anesthesia Preprocedure Evaluation (Deleted)
Anesthesia Evaluation    Reviewed: Allergy & Precautions, Patient's Chart, lab work & pertinent test results  History of Anesthesia Complications Negative for: history of anesthetic complications  Airway        Dental   Pulmonary asthma , sleep apnea ,           Cardiovascular hypertension,      Neuro/Psych PSYCHIATRIC DISORDERS Anxiety negative neurological ROS     GI/Hepatic hiatal hernia, GERD  ,  Endo/Other  diabetes  Renal/GU      Musculoskeletal   Abdominal   Peds  Hematology   Anesthesia Other Findings   Reproductive/Obstetrics                             Anesthesia Physical Anesthesia Plan  ASA: III  Anesthesia Plan: MAC   Post-op Pain Management:    Induction: Intravenous  Airway Management Planned: Simple Face Mask  Additional Equipment:   Intra-op Plan:   Post-operative Plan:   Informed Consent:   Plan Discussed with: Anesthesiologist  Anesthesia Plan Comments:         Anesthesia Quick Evaluation

## 2015-09-17 NOTE — Progress Notes (Signed)
Shelley [name] [id number] Code Status: Full  Attending Provider:  Laban EmperorGardner, J MD NWG:NFAOZHY,QMVHQIPCP:GRIFFIN,ELAINE Thomasena EdisOLLINS, MD Consults/ Treatment Team: Treatment Team:  Md Montez Moritacs, MD  Shelley [name] is a 50 y.o. female patient admitted from ED awake, alert - oriented  X 4 - no acute distress noted.  VSS  IV in place, occlusive dsg intact without redness.  Orientation to room, and floor completed with information packet given to patient/family.  Patient declined safety video at this time.  Admission INP armband ID verified with patient/family, and in place.   SR up x 2, fall assessment complete, with patient and family able to verbalize understanding of risk associated with falls, and verbalized understanding to call nsg before up out of bed.  Call light within reach, patient able to voice, and demonstrate understanding.  Skin, clean-dry- intact without evidence of bruising, or skin tears.   No evidence of skin break down noted on exam.     Will cont to eval and treat per MD orders.  Sandrea HughsJeter, [place] M, RN

## 2015-09-17 NOTE — Progress Notes (Signed)
Patient was transported from 5W27 to 2H15 on BIPAP. No complications. RT for unit notified.

## 2015-09-17 NOTE — Progress Notes (Signed)
Report given to [place] SpringsNatalie, Charity fundraiserN. Patient to be transferred shortly.

## 2015-09-17 NOTE — Consult Note (Signed)
Reason for Consult: BRBPR, hemorrhoids  Referring Physician: Dr. Mart Piggs    HPI: Shelley [name] is a 50 year old female with a history of diverticulosis, asthma, OSA, chronic respiratory failure 2/2 restrictive lung diease on apparent home 02 and bipap at night, obesity, mental retardation who presented with a 2-3 week history of bright and dark bleeding per rectum.  She was hospitalized in February for the same problem at which time she underwent a colonoscopy by Dr. Benson Norway with revealed a AVM and internal hemorrhoids.  Normal hemoglobin around 8, yesterday found to be down to 5.4 s/p 2 units of PRBCs.  She denies further episodes.  Vital signs are stable.  H&H this AM 6.9/24.5.  We have been asked to evaluate for the bleeding and hemorrhoids.   Past Medical History  Diagnosis Date  . Asthma   . Diabetes mellitus   . Hypertension   . Hyperlipidemia   . Pneumonia   . Anxiety   . GERD (gastroesophageal reflux disease)   . Arthritis   . Mental retardation   . Hiatal hernia   . AVM (arteriovenous malformation) of colon   . Hemorrhoids     Internal and external  . Diverticulosis     Past Surgical History  Procedure Laterality Date  . Cholecystectomy      2006  . Eye surgery    . Colonoscopy N/A 02/06/2013    Procedure: COLONOSCOPY;  Surgeon: Beryle Beams, MD;  Location: [facility] ENDOSCOPY;  Service: Endoscopy;  Laterality: N/A;  . Esophagogastroduodenoscopy N/A 02/06/2013    Procedure: ESOPHAGOGASTRODUODENOSCOPY (EGD);  Surgeon: Beryle Beams, MD;  Location: [facility];  Service: Endoscopy;  Laterality: N/A;  . Colonoscopy N/A 07/31/2015    Procedure: COLONOSCOPY;  Surgeon: Carol Ada, MD;  Location: [facility] ENDOSCOPY;  Service: Endoscopy;  Laterality: N/A;  . Hot hemostasis N/A 07/31/2015    Procedure: HOT HEMOSTASIS (ARGON PLASMA COAGULATION/BICAP);  Surgeon: Carol Ada, MD;  Location: [facility] ENDOSCOPY;  Service: Endoscopy;  Laterality: N/A;    Family History  Problem Relation Age of  Onset  . COPD Mother     Social History:  reports that she has never smoked. She has never used smokeless tobacco. She reports that she does not drink alcohol or use illicit drugs.  Allergies:  Allergies  Allergen Reactions  . Fish Allergy Swelling  . Pepto-Bismol [Bismuth Subsalicylate] Nausea And Vomiting  . Cortisone Itching and Rash    Injections and cream only.  PO ok.  . Trichophyton Itching    Also sneezing accompanying as well    Medications:  Scheduled Meds: . sodium chloride   Intravenous Once  . budesonide  0.25 mg Nebulization BID  . fenofibrate  160 mg Oral Daily  . insulin aspart  0-9 Units Subcutaneous 6 times per day  . iron polysaccharides  150 mg Oral Daily  . montelukast  10 mg Oral QHS  . pantoprazole  80 mg Oral Daily  . rosuvastatin  10 mg Oral Daily  . sertraline  100 mg Oral Daily   Continuous Infusions:  PRN Meds:.albuterol, loratadine   Results for orders placed or performed during the hospital encounter of 09/16/15 (from the past 48 hour(s))  Type and screen [facility]     Status: None (Preliminary result)   Collection Time: 09/16/15  6:35 PM  Result Value Ref Range   ABO/RH(D) A POS    Antibody Screen NEG    Sample Expiration 09/19/2015    Unit Number [id number]  Blood Component Type RED CELLS,LR    Unit division 00    Status of Unit ISSUED    Transfusion Status OK TO TRANSFUSE    Crossmatch Result Compatible    Unit Number [id number]    Blood Component Type RED CELLS,LR    Unit division 00    Status of Unit ISSUED    Transfusion Status OK TO TRANSFUSE    Crossmatch Result Compatible   Comprehensive metabolic panel     Status: Abnormal   Collection Time: 09/16/15  7:14 PM  Result Value Ref Range   Sodium 137 135 - 145 mmol/L   Potassium 3.5 3.5 - 5.1 mmol/L   Chloride 93 (L) 101 - 111 mmol/L   CO2 33 (H) 22 - 32 mmol/L   Glucose, Bld 179 (H) 65 - 99 mg/dL   BUN 9 6 - 20 mg/dL   Creatinine, Ser 0.49 0.44  - 1.00 mg/dL   Calcium 8.4 (L) 8.9 - 10.3 mg/dL   Total Protein 7.3 6.5 - 8.1 g/dL   Albumin 3.3 (L) 3.5 - 5.0 g/dL   AST 24 15 - 41 U/L   ALT 17 14 - 54 U/L   Alkaline Phosphatase 67 38 - 126 U/L   Total Bilirubin 0.2 (L) 0.3 - 1.2 mg/dL   GFR calc non Af Amer >60 >60 mL/min   GFR calc Af Amer >60 >60 mL/min    Comment: (NOTE) The eGFR has been calculated using the CKD EPI equation. This calculation has not been validated in all clinical situations. eGFR's persistently <60 mL/min signify possible Chronic Kidney Disease.    Anion gap 11 5 - 15  CBC     Status: Abnormal   Collection Time: 09/16/15  7:14 PM  Result Value Ref Range   WBC 3.1 (L) 4.0 - 10.5 K/uL   RBC 2.80 (L) 3.87 - 5.11 MIL/uL   Hemoglobin 5.4 (LL) 12.0 - 15.0 g/dL    Comment: REPEATED TO VERIFY CRITICAL RESULT CALLED TO, READ BACK BY AND VERIFIED WITH: M WHITE,RN AT 1944 ON 4.5.17 BY W JOHNSON    HCT 21.1 (L) 36.0 - 46.0 %   MCV 75.4 (L) 78.0 - 100.0 fL   MCH 19.3 (L) 26.0 - 34.0 pg   MCHC 25.6 (L) 30.0 - 36.0 g/dL   RDW 21.4 (H) 11.5 - 15.5 %   Platelets 212 150 - 400 K/uL  Prepare RBC     Status: None   Collection Time: 09/16/15  8:16 PM  Result Value Ref Range   Order Confirmation ORDER PROCESSED BY BLOOD BANK   POC occult blood, ED     Status: None   Collection Time: 09/16/15  8:29 PM  Result Value Ref Range   Fecal Occult Bld NEGATIVE NEGATIVE  Glucose, capillary     Status: Abnormal   Collection Time: 09/16/15 10:41 PM  Result Value Ref Range   Glucose-Capillary 178 (H) 65 - 99 mg/dL  Glucose, capillary     Status: Abnormal   Collection Time: 09/17/15 12:34 AM  Result Value Ref Range   Glucose-Capillary 149 (H) 65 - 99 mg/dL  Glucose, capillary     Status: Abnormal   Collection Time: 09/17/15  4:40 AM  Result Value Ref Range   Glucose-Capillary 184 (H) 65 - 99 mg/dL  CBC     Status: Abnormal   Collection Time: 09/17/15  5:16 AM  Result Value Ref Range   WBC 2.6 (L) 4.0 - 10.5 K/uL     Comment:  SPECIMEN CHECKED FOR CLOTS REPEATED TO VERIFY    RBC 3.13 (L) 3.87 - 5.11 MIL/uL   Hemoglobin 6.9 (LL) 12.0 - 15.0 g/dL    Comment: REPEATED TO VERIFY POST TRANSFUSION SPECIMEN CRITICAL VALUE NOTED.  VALUE IS CONSISTENT WITH PREVIOUSLY REPORTED AND CALLED VALUE.    HCT 24.5 (L) 36.0 - 46.0 %   MCV 78.3 78.0 - 100.0 fL    Comment: POST TRANSFUSION SPECIMEN   MCH 22.0 (L) 26.0 - 34.0 pg   MCHC 28.2 (L) 30.0 - 36.0 g/dL   RDW 20.5 (H) 11.5 - 15.5 %   Platelets 168 150 - 400 K/uL    Comment: REPEATED TO VERIFY  Basic metabolic panel     Status: Abnormal   Collection Time: 09/17/15  5:16 AM  Result Value Ref Range   Sodium 142 135 - 145 mmol/L   Potassium 3.7 3.5 - 5.1 mmol/L   Chloride 95 (L) 101 - 111 mmol/L   CO2 38 (H) 22 - 32 mmol/L   Glucose, Bld 186 (H) 65 - 99 mg/dL   BUN 9 6 - 20 mg/dL   Creatinine, Ser 0.57 0.44 - 1.00 mg/dL   Calcium 8.2 (L) 8.9 - 10.3 mg/dL   GFR calc non Af Amer >60 >60 mL/min   GFR calc Af Amer >60 >60 mL/min    Comment: (NOTE) The eGFR has been calculated using the CKD EPI equation. This calculation has not been validated in all clinical situations. eGFR's persistently <60 mL/min signify possible Chronic Kidney Disease.    Anion gap 9 5 - 15  Glucose, capillary     Status: Abnormal   Collection Time: 09/17/15  8:18 AM  Result Value Ref Range   Glucose-Capillary 186 (H) 65 - 99 mg/dL    No results found.  Review of Systems  Constitutional: Negative for fever, chills, weight loss, malaise/fatigue and diaphoresis.  Respiratory: Negative for cough, hemoptysis, sputum production and shortness of breath.   Cardiovascular: Negative for chest pain, palpitations, orthopnea, claudication, leg swelling and PND.  Gastrointestinal: Positive for diarrhea, blood in stool and melena. Negative for heartburn, nausea, vomiting, abdominal pain and constipation.  Genitourinary: Negative for dysuria, urgency, frequency, hematuria and flank pain.   Neurological: Negative for dizziness, tingling, tremors, sensory change, speech change, focal weakness, seizures, loss of consciousness and weakness.   Blood pressure 128/71, pulse 88, temperature 98.3 F (36.8 C), temperature source Oral, resp. rate 18, height '5\' 7"'  (1.702 m), weight 116.6 kg (257 lb 0.9 oz), SpO2 100 %. Physical Exam  Constitutional: She is oriented to person, place, and time. She appears well-developed and well-nourished. No distress.  Cardiovascular: Normal rate, regular rhythm, normal heart sounds and intact distal pulses.  Exam reveals no gallop.   No murmur heard. Respiratory: Effort normal and breath sounds normal. No respiratory distress. She has no wheezes. She has no rales.  GI: Soft. Bowel sounds are normal. She exhibits no distension and no mass. There is no tenderness. There is no rebound and no guarding.  Musculoskeletal: Normal range of motion. She exhibits no edema or tenderness.  Neurological: She is alert and oriented to person, place, and time.  Skin: Skin is warm and dry. She is not diaphoretic.  Psychiatric: She has a normal mood and affect. Her behavior is normal.    Assessment/Plan: 50 year old female with a history of obesity, mental retardation, OSA/restrictive lung disease requiring 02, diverticulosis and mental retardation.   Lower GI bleeding Acute blood loss anemia Hx hemorrhoids  and AVMs  Recommend GI consult for a repeat colonoscopy to localize the bleeding.  Transfuse to keep hgb >7.   Thank you for the consult.   I attempted to contact her PCP Dr. Laurann Montana as requested.   [place], [place] 09/17/2015, 10:24 AM

## 2015-09-17 NOTE — Consult Note (Signed)
Reason for Consult: Hematochezia and symptomatic anemia Referring Physician: [facility]  Vickii PennaPenny A Mcglade HPI: This is a 50 year old female with a PMH of bleeding internal hemorrhoids, diverticula, and cecal AVM admitted for symptomatic anemia.  She was evaluated at her PCP's office and identified to have an HGB of 5.4 g/dL.  It was a drop down from the 8 range over the past several weeks.  In 07/31/2015 I performed a colonoscopy for her complaints of hematochezia and melena.  The procedure was rather unrevealing in that there was the possibility of a residual cecal AVM.  This area was ablated with APC.  On 02/06/2013 she underwent an EGD/Colonoscopy for anemia and heme positive stool.  During that colonoscopy there was clear evidence of an AVM and it was successfully ablated.  My recommendation after the last colonoscopy was that if she had recurrent hematochezia she needed further evaluation/treatment of her hemorrhoids.  Surgery was consulted and they feel that her bleeding needs to be relocalized.  Past Medical History  Diagnosis Date  . Asthma   . Diabetes mellitus   . Hypertension   . Hyperlipidemia   . Pneumonia   . Anxiety   . GERD (gastroesophageal reflux disease)   . Arthritis   . Mental retardation   . Hiatal hernia   . AVM (arteriovenous malformation) of colon   . Hemorrhoids     Internal and external  . Diverticulosis   . GI bleed 09/2015    Past Surgical History  Procedure Laterality Date  . Cholecystectomy      2006  . Eye surgery    . Colonoscopy N/A 02/06/2013    Procedure: COLONOSCOPY;  Surgeon: Theda BelfastPatrick D Dnasia Gauna, MD;  Location: [facility] ENDOSCOPY;  Service: Endoscopy;  Laterality: N/A;  . Esophagogastroduodenoscopy N/A 02/06/2013    Procedure: ESOPHAGOGASTRODUODENOSCOPY (EGD);  Surgeon: Theda BelfastPatrick D Kazuko Clemence, MD;  [facility];  Service: Endoscopy;  Laterality: N/A;  . Colonoscopy N/A 07/31/2015    Procedure: COLONOSCOPY;  Surgeon: Jeani HawkingPatrick Lenzi Marmo, MD;  [facility];  Service: Endoscopy;  Laterality: N/A;  . Hot hemostasis N/A 07/31/2015    Procedure: HOT HEMOSTASIS (ARGON PLASMA COAGULATION/BICAP);  Surgeon: Jeani HawkingPatrick Kason Benak, MD;  [facility];  Service: Endoscopy;  Laterality: N/A;    Family History  Problem Relation Age of Onset  . COPD Mother     Social History:  reports that she has never smoked. She has never used smokeless tobacco. She reports that she does not drink alcohol or use illicit drugs.  Allergies:  Allergies  Allergen Reactions  . Fish Allergy Swelling  . Pepto-Bismol [Bismuth Subsalicylate] Nausea And Vomiting  . Cortisone Itching and Rash    Injections and cream only.  PO ok.  . Trichophyton Itching    Also sneezing accompanying as well    Medications:  Scheduled: . budesonide  0.25 mg Nebulization BID  . fenofibrate  160 mg Oral Daily  . insulin aspart  0-9 Units Subcutaneous 6 times per day  . iron polysaccharides  150 mg Oral Daily  . montelukast  10 mg Oral QHS  . pantoprazole  80 mg Oral Daily  . rosuvastatin  10 mg Oral Daily  . sertraline  100 mg Oral Daily   Continuous:   Results for orders placed or performed during the hospital encounter of 09/16/15 (from the past 24 hour(s))  Type and screen [facility]     Status: None (Preliminary result)   Collection Time: 09/16/15  6:35 PM  Result Value Ref Range   ABO/RH(D) A POS    Antibody Screen NEG    Sample Expiration 09/19/2015    Unit Number [id number]    Blood Component Type RED CELLS,LR    Unit division 00    Status of Unit ISSUED,FINAL    Transfusion Status OK TO TRANSFUSE    Crossmatch Result Compatible    Unit Number [id number]    Blood Component Type RED CELLS,LR    Unit division 00    Status of Unit ISSUED    Transfusion Status OK TO TRANSFUSE    Crossmatch Result Compatible    Unit Number [id number]    Blood Component Type RED CELLS,LR    Unit division 00    Status of Unit ISSUED    Transfusion  Status OK TO TRANSFUSE    Crossmatch Result Compatible    Unit Number [id number]    Blood Component Type RED CELLS,LR    Unit division 00    Status of Unit ISSUED    Transfusion Status OK TO TRANSFUSE    Crossmatch Result Compatible   Comprehensive metabolic panel     Status: Abnormal   Collection Time: 09/16/15  7:14 PM  Result Value Ref Range   Sodium 137 135 - 145 mmol/L   Potassium 3.5 3.5 - 5.1 mmol/L   Chloride 93 (L) 101 - 111 mmol/L   CO2 33 (H) 22 - 32 mmol/L   Glucose, Bld 179 (H) 65 - 99 mg/dL   BUN 9 6 - 20 mg/dL   Creatinine, Ser 3.47 0.44 - 1.00 mg/dL   Calcium 8.4 (L) 8.9 - 10.3 mg/dL   Total Protein 7.3 6.5 - 8.1 g/dL   Albumin 3.3 (L) 3.5 - 5.0 g/dL   AST 24 15 - 41 U/L   ALT 17 14 - 54 U/L   Alkaline Phosphatase 67 38 - 126 U/L   Total Bilirubin 0.2 (L) 0.3 - 1.2 mg/dL   GFR calc non Af Amer >60 >60 mL/min   GFR calc Af Amer >60 >60 mL/min   Anion gap 11 5 - 15  CBC     Status: Abnormal   Collection Time: 09/16/15  7:14 PM  Result Value Ref Range   WBC 3.1 (L) 4.0 - 10.5 K/uL   RBC 2.80 (L) 3.87 - 5.11 MIL/uL   Hemoglobin 5.4 (LL) 12.0 - 15.0 g/dL   HCT 42.5 (L) 95.6 - 38.7 %   MCV 75.4 (L) 78.0 - 100.0 fL   MCH 19.3 (L) 26.0 - 34.0 pg   MCHC 25.6 (L) 30.0 - 36.0 g/dL   RDW 56.4 (H) 33.2 - 95.1 %   Platelets 212 150 - 400 K/uL  Prepare RBC     Status: None   Collection Time: 09/16/15  8:16 PM  Result Value Ref Range   Order Confirmation ORDER PROCESSED BY BLOOD BANK   POC occult blood, ED     Status: None   Collection Time: 09/16/15  8:29 PM  Result Value Ref Range   Fecal Occult Bld NEGATIVE NEGATIVE  Glucose, capillary     Status: Abnormal   Collection Time: 09/16/15 10:41 PM  Result Value Ref Range   Glucose-Capillary 178 (H) 65 - 99 mg/dL  Glucose, capillary     Status: Abnormal   Collection Time: 09/17/15 12:34 AM  Result Value Ref Range   Glucose-Capillary 149 (H) 65 - 99 mg/dL  Glucose, capillary     Status: Abnormal   Collection  Time: 09/17/15  4:40 AM  Result Value Ref Range   Glucose-Capillary 184 (H) 65 - 99 mg/dL  CBC     Status: Abnormal   Collection Time: 09/17/15  5:16 AM  Result Value Ref Range   WBC 2.6 (L) 4.0 - 10.5 K/uL   RBC 3.13 (L) 3.87 - 5.11 MIL/uL   Hemoglobin 6.9 (LL) 12.0 - 15.0 g/dL   HCT 16.1 (L) 09.6 - 04.5 %   MCV 78.3 78.0 - 100.0 fL   MCH 22.0 (L) 26.0 - 34.0 pg   MCHC 28.2 (L) 30.0 - 36.0 g/dL   RDW 40.9 (H) 81.1 - 91.4 %   Platelets 168 150 - 400 K/uL  Basic metabolic panel     Status: Abnormal   Collection Time: 09/17/15  5:16 AM  Result Value Ref Range   Sodium 142 135 - 145 mmol/L   Potassium 3.7 3.5 - 5.1 mmol/L   Chloride 95 (L) 101 - 111 mmol/L   CO2 38 (H) 22 - 32 mmol/L   Glucose, Bld 186 (H) 65 - 99 mg/dL   BUN 9 6 - 20 mg/dL   Creatinine, Ser 7.82 0.44 - 1.00 mg/dL   Calcium 8.2 (L) 8.9 - 10.3 mg/dL   GFR calc non Af Amer >60 >60 mL/min   GFR calc Af Amer >60 >60 mL/min   Anion gap 9 5 - 15  Glucose, capillary     Status: Abnormal   Collection Time: 09/17/15  8:18 AM  Result Value Ref Range   Glucose-Capillary 186 (H) 65 - 99 mg/dL  Prepare RBC     Status: None   Collection Time: 09/17/15 10:31 AM  Result Value Ref Range   Order Confirmation ORDER PROCESSED BY BLOOD BANK   Glucose, capillary     Status: Abnormal   Collection Time: 09/17/15 11:48 AM  Result Value Ref Range   Glucose-Capillary 129 (H) 65 - 99 mg/dL     No results found.  ROS:  As stated above in the HPI otherwise negative.  Blood pressure 116/66, pulse 81, temperature 98.5 F (36.9 C), temperature source Oral, resp. rate 16, height  (1.702 m), weight 116.6 kg (257 lb 0.9 oz), SpO2 100 %.    PE: Gen: NAD, Alert and Oriented HEENT:  [place]/AT, EOMI Neck: Supple, no LAD Lungs: CTA Bilaterally CV: RRR without M/G/R ABM: Soft, NTND, +BS Ext: No C/C/E  Assessment/Plan: 1) Hematochezia. 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 3) History of bleeding internal hemorrhoids. 4)  Diverticula.   I am doubtful that she has a bleeding source from the cecal AVMs, however, it is not unreasonable to repeat the colonoscopy.  If this repeat colonoscopy is negative for any proximal bleeding source, I will again recommend surgical intervention of her hemorrhoids.  She has diverticula, but I do not feel that this is the source of her bleeding for these past three weeks.  Also, her caregiver reported that it may have come from her vaginal tract.  I performed quick digital examination of her vagina, but no blood was identified.  No blood was also noted with the rectal examination.  I spoke with Dr. Sheliah Hatch, who saw the patient in October and he stated that the hemorrhoidal tissue internally was small.  She as not able to tolerate banding in the outpatient setting.    Plan: 1) Colonoscopy tomorrow.   Helem Reesor D 09/17/2015, 2:53 PM

## 2015-09-17 NOTE — Progress Notes (Signed)
Patient ID: Shelley [name], female   DOB: Dec 09, 1965, 50 y.o.   MRN: [id number]  [facility] PROGRESS NOTE  Shelley [name]:[id number] DOB: [birth year] DOA: 09/16/2015 PCP: Astrid Divine, MD  Brief narrative:    50 y.o. female with known hx of hemorrhoidal bleeding and AVM, last colonoscopy in Feb 2017 by Dr. Elnoria Howard, who presented to [facility] ED with intermittent episodes of BRBPR.  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, bleeding internal hemorrhoids, diverticula - pt has received two units PRBC on admission, Hg up from 5.4 --> 6.9 this AM - will transfuse two additional units PRBC - CBC in AM - plan for colonoscopy in AM  Active Problems:   Acute encephalopathy  - not present on admission but developed afternoon 4/6 - ABG with hypercarbia  - will hold narcotic meds - use BIiPAP if needed - pt is clinically stable for now but needs close monitoring     Leukopenia - mild, monitor     Morbid obesity due to excess calories  - Body mass index is 40.25 kg/(m^2).  DVT prophylaxis - SCD's  Code Status: Full.  Family Communication:  plan of care discussed with the patient Disposition Plan: Home when cleared by GI   IV access:  Peripheral IV  Procedures and diagnostic studies:    No results found.  Medical Consultants:  GI Surgery   Other Consultants:  Noe  IAnti-Infectives:   None  Debbora Presto, MD  [facility] Pager [phone]  If 7PM-7AM, please contact night-coverage [email or web address] Password [facility] 09/17/2015, 5:43 PM   LOS: 1 day   HPI/Subjective: No events overnight.   Objective: Filed Vitals:   09/17/15 1417 09/17/15 1544 09/17/15 1600 09/17/15 1621  BP: 116/66 148/84 112/52   Pulse: 81 87 85   Temp: 98.5 F (36.9 C)     TempSrc: Oral     Resp: Height:      Weight:      SpO2: 100% 98% 100% 95%    Intake/Output Summary (Last 24 hours) at 09/17/15 1743 Last data filed at 09/17/15  1600  Gross per 24 hour  Intake   1035 ml  Output      0 ml  Net   1035 ml    Exam:   General:  Pt is somnolent but easy to awake, NAD  Cardiovascular: Regular rate and rhythm, no rubs, no gallops  Respiratory: Clear to auscultation bilaterally, diminished breath sounds at bases   Abdomen: Soft, non tender, non distended, bowel sounds present, no guarding  Data Reviewed: Basic Metabolic Panel:  Recent Labs Lab 09/16/15 1914 09/17/15 0516  NA 137 142  K 3.5 3.7  CL 93* 95*  CO2 33* 38*  GLUCOSE 179* 186*  BUN 9 9  CREATININE 0.49 0.57  CALCIUM 8.4* 8.2*   Liver Function Tests:  Recent Labs Lab 09/16/15 1914  AST 24  ALT 17  ALKPHOS 67  BILITOT 0.2*  PROT 7.3  ALBUMIN 3.3*   CBC:  Recent Labs Lab 09/16/15 1914 09/17/15 0516  WBC 3.1* 2.6*  HGB 5.4* 6.9*  HCT 21.1* 24.5*  MCV 75.4* 78.3  PLT 212 168   CBG:  Recent Labs Lab 09/17/15 0034 09/17/15 0440 09/17/15 0818 09/17/15 1148 09/17/15 1602  GLUCAP 149* 184* 186* 129* 162*   Scheduled Meds: . budesonide  0.25 mg Nebulization BID  . fenofibrate  160 mg Oral Daily  .  insulin aspart  0-9 Units Subcutaneous 6 times per day  . iron polysaccharides  150 mg Oral Daily  . montelukast  10 mg Oral QHS  . pantoprazole  80 mg Oral Daily  . polyethylene glycol-electrolytes  4,000 mL Oral Once  . rosuvastatin  10 mg Oral Daily  . sertraline  100 mg Oral Daily   Continuous Infusions:

## 2015-09-17 NOTE — Progress Notes (Addendum)
RN paged this NP because pt was increasingly unresponsive since 1900 hrs. Pt was transferred to SDU at shift change due to somulence on the nsg floor and a resulted PCO2 in the 80s. Pt has chronic respiratory failure due to constrictive lung disease and is on 4L per [place] at home.  S: pt can not participate due mental status at present. O: Unresponsive except to aggressive tactile stimulation. Just movement, does not open eyes or follow commands.  O2 sat upper 90s on Bipap. Card: RRR  Lungs: CTA. No tachypnea. Bipap at 50% O2- rate of 10- peep 6.  Skin is pale and diaphoretic. Last sugar 121 and per SSI received 1U about 8pm.  A/P: 1. Worsening mental status on Bipap. Ordered ABG, but while waiting, pt removed bipap and was awake and talking. She was then oriented, conversant and laughing. R/p ABG showed worsening PCO2 but pt is awake and protecting airway. On 6L [place] now and will continue to watch now. If more somulent, RN to call RT to replace bipap and recheck ABG.  2. Encephalopathy-for CT. CMP looks fine. LA normal. Trop normal.  3. GIB-no bleeding noted now. CBC is pending.  Shelley NormanKaren Kirby-Graham, NP [facility] Update: pt continues to sat normally without increased WOB on 6L [place]. Hgb up from previous. Will follow. KJKG, NP [facility] Update: CT head ordered earlier is neg. Pt continues to sat well and respond appropriately on O2 per [place].  KJKG, NP

## 2015-09-17 NOTE — Significant Event (Signed)
Rapid Response Event Note  Overview: Time Called: 1540 Arrival Time: 1546 Event Type: Respiratory  Initial Focused Assessment: Patient sleepy/difficult to arouse sitting in the chair, snoring respirations. Awoke with stimulation, staff assisted patient back to bed. BP 112/52  SR 85 RR 16  O2 sats 100% on 4L [place] Upon my arrival patient arousable lying in the bed, but still very sleepy, with twitches. Caregiver at bedside  Interventions: Decreased O2 to 2l [place] O2 sats 95% Patient able to take a deep breath and cough.  She is starting to wake up more. ABG done Ph 7.31 PCO2 83 PO2 86 Bicarb 41 Notified MD of results Patient placed on night time cpap settings. Will continue to monitor.  Event Summary: Name of Physician Notified: Dr Izola PriceMyers at 1545    at    Outcome: Stayed in room and stabalized     Shelley MillinLayton, Shelley [name]

## 2015-09-17 NOTE — Progress Notes (Signed)
Came up to reassess patient since she has been on her home bipap.  No improvement in mental status.  MD notified. Orders received for Bipap and to transfer patient.  RT at bedside placing patient on Bipap and ABG done.  Labs done 12 lead EKG done, PCXR done.  RN called report and patient transported to 2H15 via bed.

## 2015-09-17 NOTE — Progress Notes (Signed)
ABG was done at 2145. NP Craige CottaKirby was at bedside. Pt. Ok per NP to keep on Nasal cannula for now. Will continue to monitor.

## 2015-09-17 NOTE — Progress Notes (Signed)
Pt still somnolent, on CPAP. Transfer to SDU. CXR requested. Blood work, CBC, CMET, 12 lead EKG, troponins pending. CT head also requested.   Debbora PrestoMAGICK-Gertude Benito, MD  [facility] Pager [phone]  If 7PM-7AM, please contact night-coverage [email or web address] Password [email or web address]

## 2015-09-18 ENCOUNTER — Encounter (HOSPITAL_COMMUNITY): Payer: Self-pay | Admitting: *Deleted

## 2015-09-18 ENCOUNTER — Encounter (HOSPITAL_COMMUNITY)
Admission: EM | Disposition: A | Discharge status: Skilled Nursing Facility | Payer: Self-pay | Source: Home / Self Care | Attending: Internal Medicine

## 2015-09-18 DIAGNOSIS — G9341 Metabolic encephalopathy: Secondary | ICD-10-CM

## 2015-09-18 DIAGNOSIS — J9622 Acute and chronic respiratory failure with hypercapnia: Secondary | ICD-10-CM

## 2015-09-18 DIAGNOSIS — K922 Gastrointestinal hemorrhage, unspecified: Secondary | ICD-10-CM

## 2015-09-18 DIAGNOSIS — D62 Acute posthemorrhagic anemia: Secondary | ICD-10-CM

## 2015-09-18 DIAGNOSIS — J9621 Acute and chronic respiratory failure with hypoxia: Secondary | ICD-10-CM

## 2015-09-18 HISTORY — PX: FLEXIBLE SIGMOIDOSCOPY: SHX5431

## 2015-09-18 LAB — TROPONIN I

## 2015-09-18 LAB — TYPE AND SCREEN
ABO/RH(D): A POS
Antibody Screen: NEGATIVE
UNIT DIVISION: 0
UNIT DIVISION: 0
UNIT DIVISION: 0
UNIT DIVISION: 0

## 2015-09-18 LAB — BASIC METABOLIC PANEL
ANION GAP: 10 (ref 5–15)
BUN: 8 mg/dL (ref 6–20)
CHLORIDE: 95 mmol/L — AB (ref 101–111)
CO2: 38 mmol/L — AB (ref 22–32)
CREATININE: 0.43 mg/dL — AB (ref 0.44–1.00)
Calcium: 8.5 mg/dL — ABNORMAL LOW (ref 8.9–10.3)
GFR calc non Af Amer: >60 mL/min (ref >60)
Glucose, Bld: 165 mg/dL — ABNORMAL HIGH (ref 65–99)
POTASSIUM: 4.5 mmol/L (ref 3.5–5.1)
SODIUM: 143 mmol/L (ref 135–145)

## 2015-09-18 LAB — CBC
HEMATOCRIT: 32.3 % — AB (ref 36.0–46.0)
HEMOGLOBIN: 9 g/dL — AB (ref 12.0–15.0)
MCH: 22.7 pg — ABNORMAL LOW (ref 26.0–34.0)
MCHC: 27.9 g/dL — AB (ref 30.0–36.0)
MCV: 81.6 fL (ref 78.0–100.0)
Platelets: 165 10*3/uL (ref 150–400)
RBC: 3.96 MIL/uL (ref 3.87–5.11)
RDW: 20 % — ABNORMAL HIGH (ref 11.5–15.5)
WBC: 1.9 10*3/uL — ABNORMAL LOW (ref 4.0–10.5)

## 2015-09-18 LAB — MRSA PCR SCREENING: MRSA by PCR: NEGATIVE

## 2015-09-18 LAB — GLUCOSE, CAPILLARY
GLUCOSE-CAPILLARY: 106 mg/dL — AB (ref 65–99)
GLUCOSE-CAPILLARY: 131 mg/dL — AB (ref 65–99)
GLUCOSE-CAPILLARY: 142 mg/dL — AB (ref 65–99)
GLUCOSE-CAPILLARY: 178 mg/dL — AB (ref 65–99)
Glucose-Capillary: 191 mg/dL — ABNORMAL HIGH (ref 65–99)
Glucose-Capillary: 163 mg/dL — ABNORMAL HIGH (ref 65–99)
Glucose-Capillary: 148 mg/dL — ABNORMAL HIGH (ref 65–99)

## 2015-09-18 SURGERY — SIGMOIDOSCOPY, FLEXIBLE

## 2015-09-18 SURGERY — CANCELLED PROCEDURE
Anesthesia: Monitor Anesthesia Care

## 2015-09-18 MED ORDER — INSULIN ASPART 100 UNIT/ML ~~LOC~~ SOLN
0.0000 [IU] | Freq: Three times a day (TID) | SUBCUTANEOUS | Status: DC
  Administered 2015-09-19: 2 [IU] via SUBCUTANEOUS
  Administered 2015-09-19: 3 [IU] via SUBCUTANEOUS
  Administered 2015-09-19: 1 [IU] via SUBCUTANEOUS
  Administered 2015-09-20: 3 [IU] via SUBCUTANEOUS
  Administered 2015-09-20: 1 [IU] via SUBCUTANEOUS
  Administered 2015-09-20 – 2015-09-21 (×2): 2 [IU] via SUBCUTANEOUS
  Administered 2015-09-21: 1 [IU] via SUBCUTANEOUS
  Administered 2015-09-21: 2 [IU] via SUBCUTANEOUS
  Administered 2015-09-22 (×2): 1 [IU] via SUBCUTANEOUS

## 2015-09-18 NOTE — Progress Notes (Addendum)
Patient ID: Shelley [name], female   DOB: 07/14/1965, 50 y.o.   MRN: [id number]  [facility] PROGRESS NOTE  Shelley [name] AOZ:[id number] DOB: 12/08/1965 DOA: 09/16/2015 PCP: [name],[name], MD  Brief narrative:    50 y.o. female with known hx of hemorrhoidal bleeding and AVM, last colonoscopy in Feb 2017 by Dr. Elnoria HowardHung, [facility] with intermittent episodes of BRBPR.  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, bleeding internal hemorrhoids, diverticula - s/p prbc transfusion, hgb has been stable,[per RN last stool with a few blood streaks, but no frank blood observed - currently npo, plan for colonoscopy without anesthesia and without prep on 4/7,  per GI Dr Elnoria HowardHung  Blood loss anemia: hgb on presentation 5.4, received total of 4units of prbc since admission (1uint on 4/5, 3units of 4/6), hgb has been stable the last 12hrs,  Active Problems:   Acute encephalopathy with h/o obesity hypoventilation and osa - not present on admission but developed afternoon 4/6 - ABG with hypercarbia, avoid narcotic meds or sedatives - use BIiPAP prn during day time and qhs - pt is clinically stable for now but needs close monitoring in stepdown unit   acute on chronic respiratory failure with hypoxia and hypercapnia:  With underline obesity hypoventilation and osa use BIiPAP prn during day time and qhs   Leukopenia - mild, monitor     Morbid obesity due to excess calories  - Body mass index is 40.22 kg/(m^2).  DVT prophylaxis - SCD's  Code Status: Full.  Family Communication:  plan of care discussed with the patient Disposition Plan: remain in stepdown, eventually Home when cleared by GI   IV access:  Peripheral IV  Procedures and diagnostic studies:    Ct Head Wo Contrast  09/18/2015  CLINICAL DATA:  Somnolent. History of mental retardation, hypertension, hyperlipidemia and diabetes. EXAM: CT HEAD WITHOUT CONTRAST TECHNIQUE:  Contiguous axial images were obtained from the base of the skull through the vertex without intravenous contrast. COMPARISON:  None. FINDINGS: INTRACRANIAL CONTENTS: The ventricles and sulci are normal. No intraparenchymal hemorrhage, mass effect nor midline shift. No acute large vascular territory infarcts. No abnormal extra-axial fluid collections. Basal cisterns are patent. ORBITS: The included ocular globes and orbital contents are normal. SINUSES: The mastoid aircells and included paranasal sinuses are well-aerated. SKULL/SOFT TISSUES: No skull fracture. No significant soft tissue swelling. IMPRESSION: Negative CT head. Electronically Signed   By: Awilda Metroourtnay  Bloomer M.D.   On: 09/18/2015 01:00   Dg Chest Port 1 View  09/17/2015  CLINICAL DATA:  Lethargy EXAM: PORTABLE CHEST 1 VIEW COMPARISON:  03/07/2012 FINDINGS: Cardiomegaly is noted. Central mild vascular congestion without convincing pulmonary edema. No segmental infiltrate. Poor inspiration with bilateral basilar atelectasis left greater than right. IMPRESSION: Cardiomegaly. Central vascular congestion without convincing pulmonary edema. Poor inspiration. Bilateral basilar atelectasis left greater than right. No segmental infiltrate. Electronically Signed   By: Natasha MeadLiviu  Pop M.D.   On: 09/17/2015 20:02    Medical Consultants:  GI Surgery   Other Consultants:  Deatra JamesNoe  IAnti-Infectives:   Eldridge AbrahamsNone  Mischele Detter, MD PhD [facility] Pager [phone]  If 7PM-7AM, please contact night-coverage [email or web address] Password [email or web address] 09/18/2015, 1:45 PM   LOS: 2 days   HPI/Subjective: On 4 liter oxygen, no active bleed, denies pain, very hard to be aroused, but able to stay awake and aaox3 when awake, I have discussed the need of prn bipap with the patient,  patient is very reluctant to be put on bipap, but agrees to it when needed.  Objective: Filed Vitals:   09/18/15 0741 09/18/15 0800 09/18/15 0823 09/18/15 1110  BP: 130/75 131/77  134/69  Pulse: 91 90  96  Temp:  98.1 F (36.7 C)   98.2 F (36.8 C)  TempSrc: Oral   Oral  Resp: Height:      Weight:      SpO2: 100% 99% 99% 99%    Intake/Output Summary (Last 24 hours) at 09/18/15 1345 Last data filed at 09/18/15 0800  Gross per 24 hour  Intake    388 ml  Output      0 ml  Net    388 ml    Exam:   General:  Pt is very hard to be aroused, but able to stay awake and aaox3 when awake, obese  Cardiovascular: Regular rate and rhythm, no rubs, no gallops  Respiratory: Clear to auscultation bilaterally, diminished breath sounds at bases   Abdomen: Soft, non tender, non distended, bowel sounds present, no guarding  Data Reviewed: Basic Metabolic Panel:  Recent Labs Lab 09/16/15 1914 09/17/15 0516 09/17/15 1851 09/18/15 0651  NA 137 142 144 143  K 3.5 3.7 3.9 4.5  CL 93* 95* 96* 95*  CO2 33* 38* 40* 38*  GLUCOSE 179* 186* 123* 165*  BUN CREATININE 0.49 0.57 0.49 0.43*  CALCIUM 8.4* 8.2* 8.7* 8.5*   Liver Function Tests:  Recent Labs Lab 09/16/15 1914 09/17/15 1851  AST 24 23  ALT 17 18  ALKPHOS 67 64  BILITOT 0.2* 0.9  PROT 7.3 7.0  ALBUMIN 3.3* 3.0*   CBC:  Recent Labs Lab 09/16/15 1914 09/17/15 0516 09/17/15 2156 09/18/15 0651  WBC 3.1* 2.6* 2.8* 1.9*  HGB 5.4* 6.9* 8.8* 9.0*  HCT 21.1* 24.5* 29.8* 32.3*  MCV 75.4* 78.3 80.3 81.6  PLT 212 168 164 165   CBG:  Recent Labs Lab 09/17/15 2126 09/18/15 0020 09/18/15 0422 09/18/15 0746 09/18/15 1108  GLUCAP 131* 142* 191* 163* 148*   Scheduled Meds: . budesonide  0.25 mg Nebulization BID  . fenofibrate  160 mg Oral Daily  . insulin aspart  0-9 Units Subcutaneous 6 times per day  . iron polysaccharides  150 mg Oral Daily  . montelukast  10 mg Oral QHS  . pantoprazole  80 mg Oral Daily  . rosuvastatin  10 mg Oral Daily  . sertraline  100 mg Oral Daily   Continuous Infusions: . sodium chloride 20 mL/hr at 09/18/15 0019

## 2015-09-18 NOTE — Clinical Documentation Improvement (Signed)
Internal Medicine  Abnormal Lab/Test Results:   Component     Latest Ref Rng 09/17/2015         7:03 PM  O2 Content      4.0  Delivery systems      BILEVEL POSITIVE AIRWAY PRESSURE  Inspiratory PAP      17  Expiratory PAP      11  pH, Arterial     7.350 - 7.450 7.318 (L)  pCO2 arterial     35.0 - 45.0 mmHg 86.7 (HH)  pO2, Arterial     80.0 - 100.0 mmHg 141 (H)  Bicarbonate     20.0 - 24.0 mEq/L 43.2 (H)  TCO2     0 - 100 mmol/L 45.8  Acid-Base Excess     0.0 - 2.0 mmol/L 16.4 (H)  O2 Saturation      99.0  Patient temperature      98.6  Collection site      RIGHT RADIAL  Drawn by      [id number]  Sample type      ARTERIAL DRAW  Allens test (pass/fail)     PASS PASS   Component     Latest Ref Rng 09/17/2015         9:46 PM  O2 Content        Delivery systems        Inspiratory PAP        Expiratory PAP        pH, Arterial     7.350 - 7.450 7.299 (L)  pCO2 arterial     35.0 - 45.0 mmHg 93.2 (HH)  pO2, Arterial     80.0 - 100.0 mmHg 109.0 (H)  Bicarbonate     20.0 - 24.0 mEq/L 45.7 (H)  TCO2     0 - 100 mmol/L 48  Acid-Base Excess     0.0 - 2.0 mmol/L 16.0 (H)  O2 Saturation      97.0  Patient temperature      98.9 F  Collection site      RADIAL, ALLEN'S TEST ACCEPTABLE  Drawn by      RT  Sample type      ARTERIAL  Allens test (pass/fail)     PASS    Possible Clinical Conditions associated with below indicators   Respiratory acidosis  Other Condition  Cannot Clinically Determine   Supporting Information: 50 year old female admitted  For GI bleed History of Chronic respiratory failure, OSA, and asthma   4/6 significant Event w/ Rapid Response Initial Focused Assessment: Patient sleepy/difficult to arouse sitting in the chair, snoring respirations. Awoke with stimulation, staff assisted patient back to bed. BP 112/52 SR 85 RR 16 O2 sats 100% on 4L [place] Upon my arrival patient arousable lying in the bed, but still very sleepy, with  twitches. Caregiver at bedside  Interventions: Decreased O2 to 2l [place] O2 sats 95% Patient able to take a deep breath and cough. She is starting to wake up more. ABG done Ph 7.31 PCO2 83 PO2 86 Bicarb 41 Notified MD of results Patient placed on night time cpap settings. Will continue to monitor.  4/6 progress note: RN paged this NP because pt was increasingly unresponsive since 1900 hrs. Pt was transferred to SDU at shift change due to somulence on the nsg floor and a resulted PCO2 in the 80s. Pt has chronic respiratory failure due to constrictive lung disease and is on 4L per [place] at home.  S: pt can  not participate due mental status at present. O: Unresponsive except to aggressive tactile stimulation. Just movement, does not open eyes or follow commands.  O2 sat upper 90s on Bipap. Card: RRR Lungs: CTA. No tachypnea. Bipap at 50% O2- rate of 10- peep 6.  Skin is pale and diaphoretic. Last sugar 121 and per SSI received 1U about 8pm.  A/P: 1. Worsening mental status on Bipap. Ordered ABG, but while waiting, pt removed bipap and was awake and talking. She was then oriented, conversant and laughing. R/p ABG showed worsening PCO2 but pt is awake and protecting airway. On 6L [place] now and will continue to watch now. If more somulent, RN to call RT to replace bipap and recheck ABG.   Treatment Provided: See above   Please exercise your independent, professional judgment when responding. A specific answer is not anticipated or expected.   Thank You,  Harless Littenebora T Rashaan Wyles [employer] Information Management [place] [phone]

## 2015-09-18 NOTE — Op Note (Signed)
[facility] Patient Name: Shelley [name] Procedure Date : 09/18/2015 MRN: [id number] Attending MD: Jeani Hawking , MD Date of Birth: May 21, 1966 CSN: [id number] Age: 50 Admit Type: Inpatient Procedure:                Flexible Sigmoidoscopy Indications:              Hematochezia Providers:                Jeani Hawking, MD, Weston Settle, RN, Beryle Beams, Technician Referring MD:              Medicines:                None Complications:            No immediate complications. Estimated Blood Loss:     Estimated blood loss: none. Procedure:                Pre-Anesthesia Assessment:                           - Prior to the procedure, a History and Physical                            was performed, and patient medications and                            allergies were reviewed. The patient's tolerance of                            previous anesthesia was also reviewed. The risks                            and benefits of the procedure and the sedation                            options and risks were discussed with the patient.                            All questions were answered, and informed consent                            was obtained. Prior Anticoagulants: The patient has                            taken no previous anticoagulant or antiplatelet                            agents. ASA Grade Assessment: III - A patient with                            severe systemic disease. After reviewing the risks                            and benefits, the patient  was deemed in                            satisfactory condition to undergo the procedure.                           - Sedation was administered by an anesthesia                            professional. Deep sedation was attained.                           After obtaining informed consent, the scope was                            passed under direct vision. The Endoscope was       introduced through the anus and advanced to the the                            descending colon. The flexible sigmoidoscopy was                            accomplished without difficulty. The patient                            tolerated the procedure well. The quality of the                            bowel preparation was adequate. Scope In: Scope Out: Findings:      The entire examined colon appeared normal. There was no evidence of any       blood to suggest a proximal source of bleeding. The stool was greenish       in color. Impression:               - The entire examined colon is normal.                           - No specimens collected. Moderate Sedation:      None Recommendation:           - Return patient to hospital ward for ongoing care.                           - Outpatient follow up with Surgery for further                            evaluation and treatment of her hemorrhoids.                           - Signing off. Procedure Code(s):        --- Professional ---                           [phone], Sigmoidoscopy, flexible; diagnostic,  including collection of specimen(s) by brushing or                            washing, when performed (separate procedure) Diagnosis Code(s):        --- Professional ---                           K92.1, Melena (includes Hematochezia) CPT copyright 2016 American Medical Association. All rights reserved. The codes documented in this report are preliminary and upon coder review may  be revised to meet current compliance requirements. Jeani HawkingPatrick Jayelyn Barno, MD Jeani HawkingPatrick Edmonia Gonser, MD 09/18/2015 2:57:04 PM This report has been signed electronically. Number of Addenda: 0

## 2015-09-18 NOTE — Progress Notes (Signed)
Overnight events noted.  She has a severe history of sleep apnea, but she is better at this time.  She is only on [place] and responsive.  Unfortunately she cannot undergo the colonoscopy at this time, but I am doubtful that she needs the procedure.  I will, however, perform an unprepped and unsedated FFS.  This will allow me to remark on the stool color/consistency to rule out a proximal source for her hematochezia.

## 2015-09-18 NOTE — Progress Notes (Signed)
[facility]  Patient Status: Active (receiving services up to time of hospitalization)  [facility] is providing the following services: RN and PT  If patient discharges after hours, please call [phone].   Kizzie FurnishDonna Fellmy 09/18/2015, 12:13 PM

## 2015-09-18 NOTE — Plan of Care (Signed)
Problem: Nutrition: Goal: Adequate nutrition will be maintained Outcome: Not Progressing Patient NPO, advance diet to full liquid

## 2015-09-18 NOTE — Care Management Note (Signed)
Case Management Note  Patient Details  Name: Shelley [name] MRN: [id number] Date of Birth: 03/19/1966  Subjective/Objective:     Adm w gi bleed               Action/Plan: lives at home. Act w [facility] for hhrn and hhpt. Alerted donna w [facility] of adm.   Expected Discharge Date:                  Expected Discharge Plan:  Home w Home Health Services ([place] [facility]/ [facility])  In-House Referral:     Discharge planning Services  CM Consult  Post Acute Care Choice:  Resumption of Svcs/PTA Provider Choice offered to:     DME Arranged:    DME Agency:     HH Arranged:  RN, PT HH Agency:  [facility]  Status of Service:  In process, will continue to follow  Medicare Important Message Given:    Date Medicare IM Given:    Medicare IM give by:    Date Additional Medicare IM Given:    Additional Medicare Important Message give by:     If discussed at Long Length of Stay Meetings, dates discussed:    Additional Comments:  Hanley HaysDowell, Shelley Dioguardi T, RN 09/18/2015, 10:36 AM

## 2015-09-19 DIAGNOSIS — E662 Morbid (severe) obesity with alveolar hypoventilation: Secondary | ICD-10-CM

## 2015-09-19 DIAGNOSIS — K649 Unspecified hemorrhoids: Secondary | ICD-10-CM

## 2015-09-19 LAB — BASIC METABOLIC PANEL
ANION GAP: 9 (ref 5–15)
BUN: 6 mg/dL (ref 6–20)
CHLORIDE: 93 mmol/L — AB (ref 101–111)
CO2: 44 mmol/L — ABNORMAL HIGH (ref 22–32)
Calcium: 8.8 mg/dL — ABNORMAL LOW (ref 8.9–10.3)
Creatinine, Ser: 0.46 mg/dL (ref 0.44–1.00)
GFR calc Af Amer: >60 mL/min (ref >60)
GLUCOSE: 147 mg/dL — AB (ref 65–99)
POTASSIUM: 4 mmol/L (ref 3.5–5.1)
Sodium: 146 mmol/L — ABNORMAL HIGH (ref 135–145)

## 2015-09-19 LAB — CBC
HEMATOCRIT: 32.4 % — AB (ref 36.0–46.0)
HEMOGLOBIN: 8.7 g/dL — AB (ref 12.0–15.0)
MCH: 22.3 pg — AB (ref 26.0–34.0)
MCHC: 26.9 g/dL — ABNORMAL LOW (ref 30.0–36.0)
MCV: 82.9 fL (ref 78.0–100.0)
PLATELETS: 163 10*3/uL (ref 150–400)
RBC: 3.91 MIL/uL (ref 3.87–5.11)
RDW: 21.2 % — ABNORMAL HIGH (ref 11.5–15.5)
WBC: 2.8 10*3/uL — AB (ref 4.0–10.5)

## 2015-09-19 LAB — GLUCOSE, CAPILLARY
GLUCOSE-CAPILLARY: 114 mg/dL — AB (ref 65–99)
Glucose-Capillary: 153 mg/dL — ABNORMAL HIGH (ref 65–99)
Glucose-Capillary: 173 mg/dL — ABNORMAL HIGH (ref 65–99)
Glucose-Capillary: 239 mg/dL — ABNORMAL HIGH (ref 65–99)

## 2015-09-19 NOTE — Progress Notes (Signed)
Patient ID: Shelley [name], female   DOB: 1965/11/19, 50 y.o.   MRN: [id number]  [facility] PROGRESS NOTE  KENZI BARDWELL BJY:[id number] DOB: [birth year] DOA: 09/16/2015 PCP: Astrid Divine, MD  Brief narrative:    50 y.o. female with known hx of hemorrhoidal bleeding and AVM, last colonoscopy in Feb 2017 by Dr. Elnoria Howard, who presented to [facility] ED with intermittent episodes of BRBPR.  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, bleeding internal hemorrhoids, diverticula - s/p prbc transfusion, hgb has been stable,per RN last stool with a few blood streaks, but no frank blood observed - colonoscopy without anesthesia and without prep on 09/21/2022,  No significant findings, per GI Dr Elnoria Howard, patient need to follow up with general surgery as outpatient for hemorrhoids treatment  Blood loss anemia: hgb on presentation 5.4, received total of 4units of prbc since admission (1uint on 4/5, 3units of 4/6), hgb has been stable, no more active bleed  Active Problems:   Acute encephalopathy with h/o obesity hypoventilation and osa - not present on admission but developed afternoon 4/6 - ABG with hypercarbia, avoid narcotic meds or sedatives - use BIiPAP prn during day time and qhs - pt is clinically stable for now but needs close monitoring in stepdown unit -2022-09-21 drowsy, 4/8 alert and pleasant   acute on chronic respiratory failure with hypoxia and hypercapnia:  With underline obesity hypoventilation and osa use BIiPAP prn during day time and qhs   Leukopenia - mild, monitor     Morbid obesity due to excess calories  - Body mass index is 40.22 kg/(m^2).  DVT prophylaxis - SCD's  Code Status: Full.  Family Communication:  plan of care discussed with the patient Disposition Plan: remain in stepdown, SNF  IV access:  Peripheral IV  Procedures and diagnostic studies:    Ct Head Wo Contrast  09-21-2015  CLINICAL DATA:  Somnolent. History of  mental retardation, hypertension, hyperlipidemia and diabetes. EXAM: CT HEAD WITHOUT CONTRAST TECHNIQUE: Contiguous axial images were obtained from the base of the skull through the vertex without intravenous contrast. COMPARISON:  None. FINDINGS: INTRACRANIAL CONTENTS: The ventricles and sulci are normal. No intraparenchymal hemorrhage, mass effect nor midline shift. No acute large vascular territory infarcts. No abnormal extra-axial fluid collections. Basal cisterns are patent. ORBITS: The included ocular globes and orbital contents are normal. SINUSES: The mastoid aircells and included paranasal sinuses are well-aerated. SKULL/SOFT TISSUES: No skull fracture. No significant soft tissue swelling. IMPRESSION: Negative CT head. Electronically Signed   By: Awilda Metro M.D.   On: 09-21-2015 01:00   Dg Chest Port 1 View  09/17/2015  CLINICAL DATA:  Lethargy EXAM: PORTABLE CHEST 1 VIEW COMPARISON:  03/07/2012 FINDINGS: Cardiomegaly is noted. Central mild vascular congestion without convincing pulmonary edema. No segmental infiltrate. Poor inspiration with bilateral basilar atelectasis left greater than right. IMPRESSION: Cardiomegaly. Central vascular congestion without convincing pulmonary edema. Poor inspiration. Bilateral basilar atelectasis left greater than right. No segmental infiltrate. Electronically Signed   By: Natasha Mead M.D.   On: 09/17/2015 20:02    Medical Consultants:  GI Surgery   Other Consultants:  Deatra James  IAnti-Infectives:   Eldridge Abrahams, MD PhD [facility] Pager [phone]  If 7PM-7AM, please contact night-coverage [email or web address] Password [email or web address] 09/19/2015, 3:53 PM   LOS: 3 days   HPI/Subjective: On 4 liter oxygen, no active bleed, denies pain, alert and awake this am  Objective: Filed Vitals:   09/19/15  1200 09/19/15 1257 09/19/15 1300 09/19/15 1400  BP: 143/80 141/76 141/76 116/55  Pulse: 98 96 96 100  Temp:  99.2 F (37.3 C)    TempSrc:  Oral    Resp: 19 18 15 21    Height:      Weight:      SpO2: 100% 100% 99% 99%    Intake/Output Summary (Last 24 hours) at 09/19/15 1553 Last data filed at 09/19/15 1423  Gross per 24 hour  Intake    920 ml  Output      0 ml  Net    920 ml    Exam:   General:    Obese, aaox3, does not seem to be drowsy today, more interactive and pleasant.  Cardiovascular: Regular rate and rhythm, no rubs, no gallops  Respiratory: Clear to auscultation bilaterally, diminished breath sounds at bases   Abdomen: Soft, nontender, nondistended, bowel sounds present, no guarding  Data Reviewed: Basic Metabolic Panel:  Recent Labs Lab 09/16/15 1914 09/17/15 0516 09/17/15 1851 09/18/15 0651 09/19/15 0315  NA 137 142 144 143 146*  K 3.5 3.7 3.9 4.5 4.0  CL 93* 95* 96* 95* 93*  CO2 33* 38* 40* 38* 44*  GLUCOSE 179* 186* 123* 165* 147*  BUN 9 9 6 8 6   CREATININE 0.49 0.57 0.49 0.43* 0.46  CALCIUM 8.4* 8.2* 8.7* 8.5* 8.8*   Liver Function Tests:  Recent Labs Lab 09/16/15 1914 09/17/15 1851  AST 24 23  ALT 17 18  ALKPHOS 67 64  BILITOT 0.2* 0.9  PROT 7.3 7.0  ALBUMIN 3.3* 3.0*   CBC:  Recent Labs Lab 09/16/15 1914 09/17/15 0516 09/17/15 2156 09/18/15 0651 09/19/15 0315  WBC 3.1* 2.6* 2.8* 1.9* 2.8*  HGB 5.4* 6.9* 8.8* 9.0* 8.7*  HCT 21.1* 24.5* 29.8* 32.3* 32.4*  MCV 75.4* 78.3 80.3 81.6 82.9  PLT 212 168 164 165 163   CBG:  Recent Labs Lab 09/18/15 1108 09/18/15 1635 09/18/15 2141 09/19/15 0757 09/19/15 1259  GLUCAP 148* 106* 178* 153* 173*   Scheduled Meds: . budesonide  0.25 mg Nebulization BID  . fenofibrate  160 mg Oral Daily  . insulin aspart  0-9 Units Subcutaneous TID WC  . iron polysaccharides  150 mg Oral Daily  . montelukast  10 mg Oral QHS  . pantoprazole  80 mg Oral Daily  . rosuvastatin  10 mg Oral Daily  . sertraline  100 mg Oral Daily   Continuous Infusions:

## 2015-09-19 NOTE — Evaluation (Signed)
Physical Therapy Evaluation Patient Details Name: Shelley [name] MRN: [id number] DOB: Dec 10, 1965 Today's Date: 09/19/2015   History of Present Illness  50 y.o. female with known hx of hemorrhoidal bleeding and AVM, last colonoscopy in Feb 2017 by Dr. Elnoria Howard, who presented to [facility] ED with intermittent episodes of BRBPR. PMHx of HTN, DM, and mental retardation.  Clinical Impression  Pt admitted with above diagnosis. Pt currently with functional limitations due to the deficits listed below (see PT Problem List). Reports having falls at living facility PTA. Able to ambulate up to 50 feet x2 with seated rest break between. SpO2 in 90s on 4L supplemental O2, HR up to 125. Impulsive to sit/BIL LEs buckle spontaneously when approaching chair. Max assist and cues for balance due to buckling. Pt will benefit from skilled PT to increase their independence and safety with mobility to allow discharge to the venue listed below.       Follow Up Recommendations SNF;Supervision/Assistance - 24 hour    Equipment Recommendations  None recommended by PT    Recommendations for Other Services       Precautions / Restrictions Precautions Precautions: Fall Precaution Comments: wears 4L supplemental O2 Restrictions Weight Bearing Restrictions: No      Mobility  Bed Mobility               General bed mobility comments: Sitting in recliner when PT entered room  Transfers Overall transfer level: Needs assistance Equipment used: Rolling walker (2 wheeled) Transfers: Sit to/from Stand Sit to Stand: Min guard         General transfer comment: Min guard for safety. VC for hand placement. Slight sway noted but improved with UE support on RW. Impulsive to sit  Ambulation/Gait Ambulation/Gait assistance: Max assist Ambulation Distance (Feet): 50 Feet (+50) Assistive device: Rolling walker (2 wheeled) Gait Pattern/deviations: Step-through pattern;Decreased stride length;Trunk flexed Gait velocity:  decreased Gait velocity interpretation: Below normal speed for age/gender General Gait Details: VC for walker placement for proximity especially with turns, and forward gaze. HR up to 125, SpO2 in 90s on 4L supplemental O2. Fatigues after 50 feet and needs seated rest break. Able to tolerate second distance. Majority of bout pt at a min guard level however when approaching chair pt required max assist for balance due to BIL LE buckling. Max VC for safety.  Stairs            Wheelchair Mobility    Modified Rankin (Stroke Patients Only)       Balance Overall balance assessment: Needs assistance Sitting-balance support: No upper extremity supported;Feet supported Sitting balance-Leahy Scale: Normal     Standing balance support: Bilateral upper extremity supported Standing balance-Leahy Scale: Poor                               Pertinent Vitals/Pain Pain Assessment: No/denies pain    Home Living Family/patient expects to be discharged to:: Assisted living [facility]") Living Arrangements: Alone Available Help at Discharge: Personal care attendant (Per RN) Type of Home: Other(Comment) [facility])         Home Equipment: Dan Humphreys - 4 wheels;Shower seat;Grab bars - toilet;Grab bars - tub/shower;Other (comment) (O2 - 4L at all times) Additional Comments: Pt has caregiver come 2-3 hours in the morning to help her with ADL's and goes to the dining room for meals.     Prior Function Level of Independence: Needs assistance   Gait / Transfers Assistance Needed:  Modified independent with RW although reports frequent falls  ADL's / Homemaking Assistance Needed: Pt reports she only needs A in shower due to h/o falls and is not allowed to shower by herself--staff A her in /out and some with actual bathing per pt; she reports she dresses herself  Comments: Some information obtained from previous admissions.     Hand Dominance   Dominant Hand: Right     Extremity/Trunk Assessment   Upper Extremity Assessment: Defer to OT evaluation           Lower Extremity Assessment: Generalized weakness         Communication   Communication: No difficulties  Cognition Arousal/Alertness: Awake/alert Behavior During Therapy: Impulsive Overall Cognitive Status: History of cognitive impairments - at baseline                      General Comments General comments (skin integrity, edema, etc.): HR 104, SpO2 100% on 4L supplemental O2 before and after therapy session at rest.    Exercises        Assessment/Plan    PT Assessment Patient needs continued PT services  PT Diagnosis Difficulty walking;Abnormality of gait;Generalized weakness   PT Problem List Decreased strength;Decreased activity tolerance;Decreased balance;Decreased coordination;Cardiopulmonary status limiting activity;Obesity;Decreased mobility;Decreased cognition;Decreased knowledge of use of DME;Decreased safety awareness  PT Treatment Interventions DME instruction;Gait training;Functional mobility training;Therapeutic activities;Therapeutic exercise;Balance training;Neuromuscular re-education;Cognitive remediation;Patient/family education   PT Goals (Current goals can be found in the Care Plan section) Acute Rehab PT Goals Patient Stated Goal: To go home.  PT Goal Formulation: With patient Time For Goal Achievement: 10/03/15 Potential to Achieve Goals: Good    Frequency Min 2X/week   Barriers to discharge Decreased caregiver support      Co-evaluation               End of Session Equipment Utilized During Treatment: Gait belt;Oxygen;Other (comment) (tele monitor) Activity Tolerance: Patient tolerated treatment well Patient left: in bed;with call bell/phone within reach;with bed alarm set;with nursing/sitter in room Nurse Communication: Mobility status;Precautions;Other (comment) (+2 assist for transfers)         Time: [id number] PT Time  Calculation (min) (ACUTE ONLY): 20 min   Charges:   PT Evaluation $PT Eval Moderate Complexity: 1 Procedure     PT G CodesBerton Mount:        Rayford Williamsen S 09/19/2015, 11:33 AM Charlsie MerlesLogan Secor Sherri Levenhagen, PT [phone]

## 2015-09-19 NOTE — Progress Notes (Deleted)
   09/19/15 1130  What Happened  Was fall witnessed? Yes  Who witnessed fall? Logan Baraabour, PT  Patients activity before fall ambulating-assisted (witih PT)  Point of contact other (comment) (pt lowered to knees by PT)  Was patient injured? No  Follow Up  MD notified Dr. Xu  Time MD notified 1130  Additional tests No  Simple treatment Other (comment) (bathed, clean gown)  Progress note created (see row info) Yes  Adult Fall Risk Assessment  Risk Factor Category (scoring not indicated) High fall risk per protocol (document High fall risk)  Patient's Fall Risk High Fall Risk (>13 points)  Adult Fall Risk Interventions  Required Bundle Interventions *See Row Information* High fall risk - low, moderate, and high requirements implemented  Additional Interventions Fall risk signage;Individualized elimination schedule;PT/OT need assessed if change in mobility from baseline;Secure all tubes/drains;Specialty bed:  Low bed;Use of appropriate toileting equipment (bedpan, BSC, etc.)  Fall with Injury Screening  Risk For Fall Injury- See Row Information  F;Nurse judgement  Intervention(s) for 2 or more risk criteria identified Gait Belt;Specialty Low Bed  Vitals  BP (!) 142/78 mmHg  MAP (mmHg) 93  BP Location Left Arm  BP Method Automatic  Patient Position (if appropriate) Lying  Pulse Rate Source Monitor;Radial  ECG Heart Rate (!) 106  Cardiac Rhythm ST  Oxygen Therapy  SpO2 99 %  O2 Device Nasal Cannula  O2 Flow Rate (L/min) 4 L/min  Pulse Oximetry Type Continuous  Oximetry Probe Site Changed No  Pain Assessment  Pain Assessment No/denies pain  Pain Score 0  PCA/Epidural/Spinal Assessment  Respiratory Pattern Regular;Unlabored;Symmetrical  Neurological  Neuro (WDL) WDL  Level of Consciousness Alert  Orientation Level Oriented X4  Cognition Follows commands  Speech Clear;Developmetally delayed  Pupil Assessment  Yes  R Pupil Size (mm) 3  R Pupil Shape Round  R Pupil  Reaction Brisk  L Pupil Size (mm) 3  L Pupil Shape Round  L Pupil Reaction Brisk  Additional Pupil Assessments No  Motor Function/Sensation Assessment Grip  R Hand Grip Present;Strong  L Hand Grip Present;Strong  Neuro Symptoms None  Neuro Additional Assessments No  Glasgow Coma Scale  Eye Opening 4  Best Verbal Response (NON-intubated) 5  Best Motor Response 6  Glasgow Coma Scale Score 15  Musculoskeletal  Musculoskeletal (WDL) X  Assistive Device BSC;Front wheel walker  Generalized Weakness Yes  Weight Bearing Restrictions No  Integumentary  Integumentary (WDL) WDL   

## 2015-09-19 NOTE — Progress Notes (Signed)
   09/19/15 1130  What Happened  Was fall witnessed? Yes  Who witnessed fall? Etter SjogrenLogan Baraabour, PT  Patients activity before fall ambulating-assisted (witih PT)  Point of contact other (comment) (pt lowered to knees by PT)  Was patient injured? No  Follow Up  MD notified Dr. Roda ShuttersXu  Time MD notified [phone]  Additional tests No  Simple treatment Other (comment) (bathed, clean gown)  Progress note created (see row info) Yes  Adult Fall Risk Assessment  Risk Factor Category (scoring not indicated) High fall risk per protocol (document High fall risk)  Patient's Fall Risk High Fall Risk (>13 points)  Adult Fall Risk Interventions  Required Bundle Interventions *See Row Information* High fall risk - low, moderate, and high requirements implemented  Additional Interventions Fall risk signage;Individualized elimination schedule;PT/OT need assessed if change in mobility from baseline;Secure all tubes/drains;Specialty bed:  Low bed;Use of appropriate toileting equipment (bedpan, BSC, etc.)  Fall with Injury Screening  Risk For Fall Injury- See Row Information  F;Nurse judgement  Intervention(s) for 2 or more risk criteria identified Gait Belt;Specialty Low Bed  Vitals  BP (!) 142/78 mmHg  MAP (mmHg) 93  BP Location Left Arm  BP Method Automatic  Patient Position (if appropriate) Lying  Pulse Rate Source Monitor;Radial  ECG Heart Rate (!) 106  Cardiac Rhythm ST  Oxygen Therapy  SpO2 99 %  O2 Device Nasal Cannula  O2 Flow Rate (L/min) 4 L/min  Pulse Oximetry Type Continuous  Oximetry Probe Site Changed No  Pain Assessment  Pain Assessment No/denies pain  Pain Score 0  PCA/Epidural/Spinal Assessment  Respiratory Pattern Regular;Unlabored;Symmetrical  Neurological  Neuro (WDL) WDL  Level of Consciousness Alert  Orientation Level Oriented X4  Cognition Follows commands  Speech Clear;Developmetally delayed  Pupil Assessment  Yes  R Pupil Size (mm) 3  R Pupil Shape Round  R Pupil  Reaction Brisk  L Pupil Size (mm) 3  L Pupil Shape Round  L Pupil Reaction Brisk  Additional Pupil Assessments No  Motor Function/Sensation Assessment Grip  R Hand Grip Present;Strong  L Hand Grip Present;Strong  Neuro Symptoms None  Neuro Additional Assessments No  Glasgow Coma Scale  Eye Opening 4  Best Verbal Response (NON-intubated) 5  Best Motor Response 6  Glasgow Coma Scale Score 15  Musculoskeletal  Musculoskeletal (WDL) X  Assistive Device BSC;Front wheel walker  Generalized Weakness Yes  Weight Bearing Restrictions No  Integumentary  Integumentary (WDL) WDL

## 2015-09-20 DIAGNOSIS — K648 Other hemorrhoids: Principal | ICD-10-CM

## 2015-09-20 LAB — CBC
HEMATOCRIT: 32.4 % — AB (ref 36.0–46.0)
HEMOGLOBIN: 8.7 g/dL — AB (ref 12.0–15.0)
MCH: 22.5 pg — AB (ref 26.0–34.0)
MCHC: 26.9 g/dL — AB (ref 30.0–36.0)
MCV: 83.7 fL (ref 78.0–100.0)
Platelets: 175 10*3/uL (ref 150–400)
RBC: 3.87 MIL/uL (ref 3.87–5.11)
RDW: 22.2 % — ABNORMAL HIGH (ref 11.5–15.5)
WBC: 2.8 10*3/uL — ABNORMAL LOW (ref 4.0–10.5)

## 2015-09-20 LAB — GLUCOSE, CAPILLARY
GLUCOSE-CAPILLARY: 146 mg/dL — AB (ref 65–99)
Glucose-Capillary: 152 mg/dL — ABNORMAL HIGH (ref 65–99)
Glucose-Capillary: 206 mg/dL — ABNORMAL HIGH (ref 65–99)
Glucose-Capillary: 191 mg/dL — ABNORMAL HIGH (ref 65–99)

## 2015-09-20 LAB — BASIC METABOLIC PANEL
Anion gap: 6 (ref 5–15)
BUN: 9 mg/dL (ref 6–20)
CALCIUM: 8.7 mg/dL — AB (ref 8.9–10.3)
CHLORIDE: 92 mmol/L — AB (ref 101–111)
CO2: 48 mmol/L — AB (ref 22–32)
CREATININE: 0.47 mg/dL (ref 0.44–1.00)
GFR calc Af Amer: >60 mL/min (ref >60)
GLUCOSE: 160 mg/dL — AB (ref 65–99)
POTASSIUM: 3.8 mmol/L (ref 3.5–5.1)
SODIUM: 146 mmol/L — AB (ref 135–145)

## 2015-09-20 NOTE — Progress Notes (Signed)
CSW rec'd call this pm for SNF placement. CSW will ask weekday CSW to proceed with this plan. CSW notes that patient with history of MR- will place PASAR note on chart for MD signature. Full assessment and placement to be proceed.   Reece LevyJanet Tarius Stangelo, MSW, LCSW

## 2015-09-20 NOTE — Progress Notes (Signed)
Patient ID: Shelley [name], female   DOB: 02/28/1966, 50 y.o.   MRN: [id number]  [facility] PROGRESS NOTE  Shelley [name]:[id number] DOB: 1965/09/26 DOA: 09/16/2015 PCP: Astrid Divine, MD  Brief narrative:    50 y.o. female with known hx of hemorrhoidal bleeding and AVM, last colonoscopy in Feb 2017 by Dr. Elnoria Howard, who presented to [facility] ED with intermittent episodes of BRBPR.  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, bleeding internal hemorrhoids, diverticula - s/p prbc transfusion, hgb has been stable,per RN last stool with a few blood streaks, but no frank blood observed - colonoscopy without anesthesia and without prep on 2022/09/25,  No significant findings, per GI Dr Elnoria Howard, patient need to follow up with general surgery as outpatient for hemorrhoids treatment  Blood loss anemia: hgb on presentation 5.4, received total of 4units of prbc since admission (1uint on 4/5, 3units of 4/6), hgb has been stable, no more active bleed  Active Problems:   Acute encephalopathy with h/o obesity hypoventilation and osa - not present on admission but developed afternoon 4/6 - ABG with hypercarbia, avoid narcotic meds or sedatives - use BIiPAP prn during day time and qhs - pt is clinically stable for now but needs close monitoring in stepdown unit -September 25, 2022 drowsy, 4/8 alert and pleasant   acute on chronic respiratory failure with hypoxia and hypercapnia:  With underline obesity hypoventilation and osa use BIiPAP prn during day time and qhs   Leukopenia - mild, monitor     Morbid obesity due to excess calories  - Body mass index is 40.22 kg/(m^2).  DVT prophylaxis - SCD's  Code Status: Full.  Family Communication:  plan of care discussed with the patient Disposition Plan:  Medically stable to discharge to SNF pending bed availability, in stepdown unit due to needing bipap at night, i have discussed with patient about snf placement which she  agreed to.  IV access:  Peripheral IV  Procedures and diagnostic studies:    Ct Head Wo Contrast  25-Sep-2015  CLINICAL DATA:  Somnolent. History of mental retardation, hypertension, hyperlipidemia and diabetes. EXAM: CT HEAD WITHOUT CONTRAST TECHNIQUE: Contiguous axial images were obtained from the base of the skull through the vertex without intravenous contrast. COMPARISON:  None. FINDINGS: INTRACRANIAL CONTENTS: The ventricles and sulci are normal. No intraparenchymal hemorrhage, mass effect nor midline shift. No acute large vascular territory infarcts. No abnormal extra-axial fluid collections. Basal cisterns are patent. ORBITS: The included ocular globes and orbital contents are normal. SINUSES: The mastoid aircells and included paranasal sinuses are well-aerated. SKULL/SOFT TISSUES: No skull fracture. No significant soft tissue swelling. IMPRESSION: Negative CT head. Electronically Signed   By: Awilda Metro M.D.   On: 09-25-15 01:00   Dg Chest Port 1 View  09/17/2015  CLINICAL DATA:  Lethargy EXAM: PORTABLE CHEST 1 VIEW COMPARISON:  03/07/2012 FINDINGS: Cardiomegaly is noted. Central mild vascular congestion without convincing pulmonary edema. No segmental infiltrate. Poor inspiration with bilateral basilar atelectasis left greater than right. IMPRESSION: Cardiomegaly. Central vascular congestion without convincing pulmonary edema. Poor inspiration. Bilateral basilar atelectasis left greater than right. No segmental infiltrate. Electronically Signed   By: Natasha Mead M.D.   On: 09/17/2015 20:02    Medical Consultants:  GI Surgery   Other Consultants:  Deatra James  IAnti-Infectives:   Eldridge Abrahams, MD PhD [facility] Pager [phone]  If 7PM-7AM, please contact night-coverage [email or web address] Password [email or web address] 09/20/2015, 3:15 PM  LOS: 4 days   HPI/Subjective: On 4 liter oxygen, no active bleed, denies pain, alert and awake this am  Objective: Filed Vitals:   09/20/15 0400 09/20/15 0734  09/20/15 0944 09/20/15 1218  BP: 106/51 145/80  133/69  Pulse: 92 89  88  Temp: 99.3 F (37.4 C) 97.9 F (36.6 C)  98.5 F (36.9 C)  TempSrc: Oral Oral  Oral  Resp: 20 18  20   Height:      Weight:      SpO2: 100% 100% 100% 100%    Intake/Output Summary (Last 24 hours) at 09/20/15 1515 Last data filed at 09/20/15 0934  Gross per 24 hour  Intake    600 ml  Output    250 ml  Net    350 ml    Exam:   General:    Obese, aaox3, does not seem to be drowsy today, more interactive and pleasant.  Cardiovascular: Regular rate and rhythm, no rubs, no gallops  Respiratory: Clear to auscultation bilaterally, diminished breath sounds at bases   Abdomen: Soft, nontender, nondistended, bowel sounds present, no guarding  Data Reviewed: Basic Metabolic Panel:  Recent Labs Lab 09/17/15 0516 09/17/15 1851 09/18/15 0651 09/19/15 0315 09/20/15 0248  NA 142 144 143 146* 146*  K 3.7 3.9 4.5 4.0 3.8  CL 95* 96* 95* 93* 92*  CO2 38* 40* 38* 44* 48*  GLUCOSE 186* 123* 165* 147* 160*  BUN 9 6 8 6 9   CREATININE 0.57 0.49 0.43* 0.46 0.47  CALCIUM 8.2* 8.7* 8.5* 8.8* 8.7*   Liver Function Tests:  Recent Labs Lab 09/16/15 1914 09/17/15 1851  AST 24 23  ALT 17 18  ALKPHOS 67 64  BILITOT 0.2* 0.9  PROT 7.3 7.0  ALBUMIN 3.3* 3.0*   CBC:  Recent Labs Lab 09/17/15 0516 09/17/15 2156 09/18/15 0651 09/19/15 0315 09/20/15 0248  WBC 2.6* 2.8* 1.9* 2.8* 2.8*  HGB 6.9* 8.8* 9.0* 8.7* 8.7*  HCT 24.5* 29.8* 32.3* 32.4* 32.4*  MCV 78.3 80.3 81.6 82.9 83.7  PLT 168 164 165 163 175   CBG:  Recent Labs Lab 09/19/15 1259 09/19/15 1605 09/19/15 2037 09/20/15 0732 09/20/15 1212  GLUCAP 173* 239* 114* 146* 152*   Scheduled Meds: . budesonide  0.25 mg Nebulization BID  . fenofibrate  160 mg Oral Daily  . insulin aspart  0-9 Units Subcutaneous TID WC  . iron polysaccharides  150 mg Oral Daily  . montelukast  10 mg Oral QHS  . pantoprazole  80 mg Oral Daily  . rosuvastatin   10 mg Oral Daily  . sertraline  100 mg Oral Daily   Continuous Infusions:

## 2015-09-21 DIAGNOSIS — Z9989 Dependence on other enabling machines and devices: Secondary | ICD-10-CM

## 2015-09-21 DIAGNOSIS — E119 Type 2 diabetes mellitus without complications: Secondary | ICD-10-CM

## 2015-09-21 LAB — GLUCOSE, CAPILLARY
GLUCOSE-CAPILLARY: 149 mg/dL — AB (ref 65–99)
GLUCOSE-CAPILLARY: 175 mg/dL — AB (ref 65–99)
Glucose-Capillary: 183 mg/dL — ABNORMAL HIGH (ref 65–99)
Glucose-Capillary: 190 mg/dL — ABNORMAL HIGH (ref 65–99)

## 2015-09-21 LAB — BASIC METABOLIC PANEL
ANION GAP: 7 (ref 5–15)
BUN: 9 mg/dL (ref 6–20)
CHLORIDE: 91 mmol/L — AB (ref 101–111)
CO2: 47 mmol/L — ABNORMAL HIGH (ref 22–32)
CREATININE: 0.49 mg/dL (ref 0.44–1.00)
Calcium: 8.7 mg/dL — ABNORMAL LOW (ref 8.9–10.3)
GFR calc non Af Amer: >60 mL/min (ref >60)
Glucose, Bld: 151 mg/dL — ABNORMAL HIGH (ref 65–99)
POTASSIUM: 4.1 mmol/L (ref 3.5–5.1)
SODIUM: 145 mmol/L (ref 135–145)

## 2015-09-21 LAB — CBC
HCT: 31.4 % — ABNORMAL LOW (ref 36.0–46.0)
HEMOGLOBIN: 8.5 g/dL — AB (ref 12.0–15.0)
MCH: 22.5 pg — ABNORMAL LOW (ref 26.0–34.0)
MCHC: 27.1 g/dL — ABNORMAL LOW (ref 30.0–36.0)
MCV: 83.3 fL (ref 78.0–100.0)
Platelets: 166 10*3/uL (ref 150–400)
RBC: 3.77 MIL/uL — AB (ref 3.87–5.11)
RDW: 22.3 % — ABNORMAL HIGH (ref 11.5–15.5)
WBC: 3.1 10*3/uL — ABNORMAL LOW (ref 4.0–10.5)

## 2015-09-21 MED ORDER — SENNOSIDES-DOCUSATE SODIUM 8.6-50 MG PO TABS
2.0000 | ORAL_TABLET | Freq: Every day | ORAL | Status: DC
  Administered 2015-09-21: 2 via ORAL
  Filled 2015-09-21: qty 2

## 2015-09-21 MED ORDER — SENNOSIDES-DOCUSATE SODIUM 8.6-50 MG PO TABS: 2.0000 | ORAL_TABLET | Freq: Every day | ORAL | Status: DC

## 2015-09-21 MED ORDER — LOSARTAN POTASSIUM-HCTZ 50-12.5 MG PO TABS: 1.0000 | ORAL_TABLET | Freq: Every day | ORAL | Status: DC

## 2015-09-21 MED ORDER — FAMOTIDINE 20 MG PO TABS: 20.0000 mg | ORAL_TABLET | Freq: Every day | ORAL | Status: DC

## 2015-09-21 NOTE — Progress Notes (Signed)
Patient ID: Shelley [name], female   DOB: 09-Jun-1966, 50 y.o.   MRN: [id number]  [facility] PROGRESS NOTE  Shelley [name]:[id number] DOB: [birth year] DOA: 09/16/2015 PCP: Astrid Divine, MD  Brief narrative:    50 y.o. female with known hx of hemorrhoidal bleeding and AVM, last colonoscopy in Feb 2017 by Dr. Elnoria Howard, who presented to [facility] ED with intermittent episodes of BRBPR.  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, bleeding internal hemorrhoids, diverticula - s/p prbc transfusion, hgb has been stable,per RN last stool with a few blood streaks, but no frank blood observed - flexible sigmodoscopy without anesthesia and without prep on Sep 21, 2022, No significant findings, per GI Dr Elnoria Howard, patient need to follow up with general surgery as outpatient for hemorrhoids treatment   Blood loss anemia: hgb on presentation 5.4, received total of 4units of prbc since admission (1uint on 4/5, 3units of 4/6), hgb has been stable, no more active bleed  Active Problems:   Acute encephalopathy with h/o obesity hypoventilation and osa - not present on admission but developed afternoon 4/6 - ABG with hypercarbia, avoid narcotic meds or sedatives - use BIiPAP prn during day time and qhs - pt is clinically stable for now but needs close monitoring in stepdown unit -Sep 21, 2022 drowsy, 4/8, 4/9, 4/10 alert and pleasant   acute on chronic respiratory failure with hypoxia and hypercapnia:  With underline obesity hypoventilation and osa use BIiPAP prn during day time and qhs   Leukopenia - mild, monitor     Morbid obesity due to excess calories  - Body mass index is 40.22 kg/(m^2).  HTN: her blood pressure has been stable without home meds  noninsulin dependent dm2, metformin held in the hospital,  DVT prophylaxis - SCD's  Code Status: Full.  Family Communication:  plan of care discussed with the patient Disposition Plan:  Medically stable to  discharge to SNF pending bed availability, in stepdown unit due to needing bipap at night, i have discussed with patient about snf placement which she agreed to.  IV access:  Peripheral IV  Procedures and diagnostic studies:    Ct Head Wo Contrast  Sep 21, 2015  CLINICAL DATA:  Somnolent. History of mental retardation, hypertension, hyperlipidemia and diabetes. EXAM: CT HEAD WITHOUT CONTRAST TECHNIQUE: Contiguous axial images were obtained from the base of the skull through the vertex without intravenous contrast. COMPARISON:  None. FINDINGS: INTRACRANIAL CONTENTS: The ventricles and sulci are normal. No intraparenchymal hemorrhage, mass effect nor midline shift. No acute large vascular territory infarcts. No abnormal extra-axial fluid collections. Basal cisterns are patent. ORBITS: The included ocular globes and orbital contents are normal. SINUSES: The mastoid aircells and included paranasal sinuses are well-aerated. SKULL/SOFT TISSUES: No skull fracture. No significant soft tissue swelling. IMPRESSION: Negative CT head. Electronically Signed   By: Awilda Metro M.D.   On: 2015-09-21 01:00   Dg Chest Port 1 View  09/17/2015  CLINICAL DATA:  Lethargy EXAM: PORTABLE CHEST 1 VIEW COMPARISON:  03/07/2012 FINDINGS: Cardiomegaly is noted. Central mild vascular congestion without convincing pulmonary edema. No segmental infiltrate. Poor inspiration with bilateral basilar atelectasis left greater than right. IMPRESSION: Cardiomegaly. Central vascular congestion without convincing pulmonary edema. Poor inspiration. Bilateral basilar atelectasis left greater than right. No segmental infiltrate. Electronically Signed   By: Natasha Mead M.D.   On: 09/17/2015 20:02    Medical Consultants:  GI Surgery   Other Consultants:  Deatra James  IAnti-Infectives:  None  Shirlyn Savin, MD PhD [facility] Pager [phone]  If 7PM-7AM, please contact night-coverage [email or web address] Password [email or web address] 09/21/2015, 5:12 PM   LOS: 5 days    HPI/Subjective: On 4 liter oxygen, no active bleed, denies pain, alert and awake this am  Objective: Filed Vitals:   09/21/15 0726 09/21/15 0828 09/21/15 1125 09/21/15 1515  BP: 119/62  131/73 133/78  Pulse: 88  92 92  Temp: 97.6 F (36.4 C)  98.8 F (37.1 C) 98.9 F (37.2 C)  TempSrc: Oral  Oral Oral  Resp: 17  11 20   Height:      Weight:      SpO2: 99% 100% 99% 100%    Intake/Output Summary (Last 24 hours) at 09/21/15 1712 Last data filed at 09/21/15 1424  Gross per 24 hour  Intake    960 ml  Output    801 ml  Net    159 ml    Exam:   General:    Obese, aaox3, does not seem to be drowsy today, more interactive and pleasant.  Cardiovascular: Regular rate and rhythm, no rubs, no gallops  Respiratory: Clear to auscultation bilaterally, diminished breath sounds at bases   Abdomen: Soft, nontender, nondistended, bowel sounds present, no guarding  Data Reviewed: Basic Metabolic Panel:  Recent Labs Lab 09/17/15 1851 09/18/15 0651 09/19/15 0315 09/20/15 0248 09/21/15 0207  NA 144 143 146* 146* 145  K 3.9 4.5 4.0 3.8 4.1  CL 96* 95* 93* 92* 91*  CO2 40* 38* 44* 48* 47*  GLUCOSE 123* 165* 147* 160* 151*  BUN 6 8 6 9 9   CREATININE 0.49 0.43* 0.46 0.47 0.49  CALCIUM 8.7* 8.5* 8.8* 8.7* 8.7*   Liver Function Tests:  Recent Labs Lab 09/16/15 1914 09/17/15 1851  AST 24 23  ALT 17 18  ALKPHOS 67 64  BILITOT 0.2* 0.9  PROT 7.3 7.0  ALBUMIN 3.3* 3.0*   CBC:  Recent Labs Lab 09/17/15 2156 09/18/15 0651 09/19/15 0315 09/20/15 0248 09/21/15 0207  WBC 2.8* 1.9* 2.8* 2.8* 3.1*  HGB 8.8* 9.0* 8.7* 8.7* 8.5*  HCT 29.8* 32.3* 32.4* 32.4* 31.4*  MCV 80.3 81.6 82.9 83.7 83.3  PLT 164 165 163 175 166   CBG:  Recent Labs Lab 09/20/15 1723 09/20/15 2116 09/21/15 0724 09/21/15 1125 09/21/15 1513  GLUCAP 206* 191* 149* 183* 190*   Scheduled Meds: . budesonide  0.25 mg Nebulization BID  . fenofibrate  160 mg Oral Daily  . insulin aspart  0-9  Units Subcutaneous TID WC  . iron polysaccharides  150 mg Oral Daily  . montelukast  10 mg Oral QHS  . pantoprazole  80 mg Oral Daily  . rosuvastatin  10 mg Oral Daily  . senna-docusate  2 tablet Oral QHS  . sertraline  100 mg Oral Daily   Continuous Infusions:

## 2015-09-21 NOTE — Clinical Social Work Note (Signed)
Clinical Social Worker has met with patient at bedside in regards to discharge planning. CSW has attempted to contact POA and left a message for a returned phone call. Patient currently refusing SNF placement and will need [place] MUST 30 day/Level II PASARR completed. SS# is not on file (will need SS# to process  MUST PASARR).  CSW will continue to follow patient and await returned phone call. Full psychosocial assessment to follow.   Glendon Axe, MSW, LCSWA [phone] 09/21/2015 12:36 PM

## 2015-09-21 NOTE — Progress Notes (Signed)
Physical Therapy Treatment Patient Details Name: Shelley [name] A Buysse MRN: [id number] DOB: 10/20/1965 Today's Date: 09/21/2015    History of Present Illness 50 y.o. female with known hx of hemorrhoidal bleeding and AVM, last colonoscopy in Feb 2017 by Dr. Elnoria HowardHung, who [facility] with intermittent episodes of BRBPR. PMHx of HTN, DM, and mental retardation.    PT Comments    Pt very anxious regarding falling reporting "i'm scared, I'm scared." pt transferred to chair without LE buckling however pt refused to amb this date. Pt is both cognitively and physically unsafe to live alone at this time, con't to recommend SNF to allow for rehab to achieve safe mod I level of function.   Follow Up Recommendations  SNF;Supervision/Assistance - 24 hour     Equipment Recommendations  None recommended by PT    Recommendations for Other Services       Precautions / Restrictions Precautions Precautions: Fall Precaution Comments: 4Lo2 via [place] Restrictions Weight Bearing Restrictions: No    Mobility  Bed Mobility Overal bed mobility: Needs Assistance Bed Mobility: Supine to Sit     Supine to sit: Min guard     General bed mobility comments: increased time, v/c's for sequencing but able to pull self on bed rail to get to EOB  Transfers Overall transfer level: Needs assistance Equipment used: Rolling walker (2 wheeled) Transfers: Sit to/from [employer];Stand Pivot Transfers Sit to Stand: Min assist Stand pivot transfers: Min assist;+2 physical assistance;+2 safety/equipment       General transfer comment: pt very particular on how PT should help patient. pt c/o "i'm scared" began walking with RW 3 steps and then pt quickly sat back down on bed. s/p max encouragement pt agreed to transfer to chair with 2 person assist via gait belt  Ambulation/Gait             General Gait Details: pt refused this date.   Stairs            Wheelchair Mobility    Modified Rankin (Stroke  Patients Only)       Balance Overall balance assessment: Needs assistance Sitting-balance support: Feet supported;No upper extremity supported Sitting balance-Leahy Scale: Good     Standing balance support: Bilateral upper extremity supported Standing balance-Leahy Scale: Poor Standing balance comment: due to fear of falling pt needs to hold onto something                    Cognition Arousal/Alertness: Awake/alert Behavior During Therapy: WFL for tasks assessed/performed Overall Cognitive Status: History of cognitive impairments - at baseline (hx of intelectual delay)                      Exercises      General Comments        Pertinent Vitals/Pain Pain Assessment: No/denies pain    Home Living                      Prior Function            PT Goals (current goals can now be found in the care plan section) Acute Rehab PT Goals Patient Stated Goal: go home Progress towards PT goals: Progressing toward goals    Frequency  Min 2X/week    PT Plan Current plan remains appropriate    Co-evaluation             End of Session Equipment Utilized During Treatment: Gait belt;Oxygen Activity Tolerance:  (  limited by anxiety) Patient left: in chair;with chair alarm set;with call bell/phone within reach     Time: 1344-1404 PT Time Calculation (min) (ACUTE ONLY): 20 min  Charges:  $Therapeutic Activity: 8-22 mins                    G Codes:      Marcene Brawn 09/21/2015, 2:12 PM   Lewis Shock, PT, DPT Pager #: [phone] Office #: [phone]

## 2015-09-21 NOTE — Progress Notes (Signed)
Spoke w pt. sw Shelley [name] spoke w caregiver Shelley [name] who stays w pt 4-5 hrs per day. Shelley [name] is Shelley [name] [phone] wants to take pt home. Left vm for Shelley [name] w what i observed when phy there worked w pt today. Have alerted ahc that poa plans to take home and hire addit help. Dr Roda Shuttersxu very concerned and to call poa Shelley [name] to see if she can get him to change his mind for short term snf. Caregiver Shelley [name] will pick up at disch. ahc wll follow and poa to hire addit help  Unless poa changes his mind.

## 2015-09-21 NOTE — Care Management Important Message (Signed)
Important Message  Patient Details  Name: Shelley [name] MRN: [id number] Date of Birth: 12/17/1965   Medicare Important Message Given:  Yes    Zowie Lundahl Abena 09/21/2015, 12:09 PM

## 2015-09-21 NOTE — Clinical Social Work Note (Signed)
Clinical Social Work Assessment  Patient Details  Name: Shelley [name] MRN: [id number] Date of Birth: [birth year]  Date of referral:  09/21/15               Reason for consult:  Facility Placement, Discharge Planning                Permission sought to share information with:  Family Supports, Customer service manager, Case Manager Permission granted to share information::  Yes, Verbal Permission Granted  Name::      Sharlene Motts and Cecile Sheerer)  Agency::   ([place] vs SNF's )  Relationship::   (POA(cousin) and [place]))  Contact Information:   [phone] or [phone])  Housing/Transportation Living arrangements for the past 2 months:  [place] of Information:  Patient, Power of [place], Other (Comment Required) (and caregiver ) Patient Interpreter Needed:  None Criminal Activity/Legal Involvement Pertinent to Current Situation/Hospitalization:  No - Comment as needed Significant Relationships:   (POA and caregiver ) Lives with:  Facility Resident Do you feel safe going back to the place where you live?  Yes Need for family participation in patient care:  Yes (Comment)  Care giving concerns:  Patient admitted from [facility] where she has been living for the past 5 years alone.    Social Worker assessment / plan:  Holiday representative met with patient at bedside in reference to post-acute placement for SNF. CSW introduced CSW role. CSW asked patient if she was aware of PT recommendation for SNF and patient reported that she wants to go home. Pt stated that she has friends that will help her at home. Patient with MR and has POA and private duty caregivers. CSW has attempted several times today to contact/confirm discharge plans with POA, Shanon Brow. Pt's caregiver Gay Filler reported that she is with patient 4-5 hours per day mostly in the mornings and patient is also service connected with [facility] (RN and PT). Pt's  caregiver also reported that pt's POA is currently working out-of-town however available by phone. Pt's caregiver agreeable with patient returning to ILF however would feel more comfortable with Shanon Brow, [place] making that decision.   CSW received phone call from [place], Shanon Brow who reported that he would like for patient to return home and plans to increase the amount of care currently being received. CSW reported that patient will need 24/7 supervision and assistance as she is dependent with mobility and incontinent. Pt's POA reported that if patient is not in danger he would prefer for her to return home. CSW enocouraged pt's POA to discuss home options with RNCM. CSW provided RNCM with pt's POA phone number. MD to also contact POA in regards to discharge planning. No further concerns reported at this time.   NO SS# ON FILE/shadow chart. CSW has also attempted to retrieve SS# from staff at [facility] however denied access from staff. POA does not SS# at this time. CSW will continue to follow pt and pt's family for continued support and to facilitate pt's discharge needs once medically stable.   Employment status:  Disabled (Comment on whether or not currently receiving Disability) Insurance information:  Managed Medicare PT Recommendations:  [place] / Referral to community resources:  [place]  Patient/Family's Response to care:  Pt a/o x4. Pt is refusing SNF placement, reporting that she would rather return home to ILF. Pt's caregiver and POA agreeable to pt returning home at this time. Pt's POA  planning to hire additional caregivers 24/7. Pt's caregiver and POA pleasant, supportive and appreciated social work intervention.   Patient/Family's Understanding of and Emotional Response to Diagnosis, Current Treatment, and Prognosis:  Pt's caregiver and POA involved in decision making and understanding of on-going treatment and home care needs if deciding to take  patient home. CSW remains available as needed.   Emotional Assessment Appearance:  Appears stated age Attitude/Demeanor/Rapport:   (Pleasant ) Affect (typically observed):  Appropriate, Pleasant Orientation:  Oriented to Situation, Oriented to  Time, Oriented to Place, Oriented to Self Alcohol / Substance use:  Not Applicable Psych involvement (Current and /or in the community):  No (Comment)  Discharge Needs  Concerns to be addressed:  Care Coordination, Discharge Planning Concerns Readmission within the last 30 days:  No Current discharge risk:  Dependent with Mobility Barriers to Discharge:  Awaiting State Approval Tour manager), Ship broker, Continued Medical Work up  [employer], MSW, LCSWA [phone] 09/21/2015 3:03 PM

## 2015-09-21 NOTE — Discharge Summary (Signed)
Discharge Summary  Shelley [name] VQQ:[id number] DOB: 08-Jan-1966  PCP: Astrid Divine, MD  Admit date: 09/16/2015 Discharge date: 09/22/2015  Time spent: >38mins  Recommendations for Outpatient Follow-up:  1. F/u with PMD within a week  for hospital discharge follow up, repeat cbc/bmp at follow up 2. F/u with general surgery for hemorrhoids 3. Patient need to have bipap on qhs  Discharge Diagnoses:  Active Hospital Problems   Diagnosis Date Noted  . Lower GI bleed 07/29/2015  . History of arteriovenous malformation (AVM) 07/29/2015  . Hemorrhoids 03/06/2013    Resolved Hospital Problems   Diagnosis Date Noted Date Resolved  . GI bleed 09/16/2015 09/16/2015    Discharge Condition: stable  Diet recommendation: heart healthy/carb modified  Filed Weights   09/16/15 2233 09/17/15 2000 09/22/15 0429  Weight: 116.6 kg (257 lb 0.9 oz) 116.5 kg (256 lb 13.4 oz) 112.9 kg (248 lb 14.4 oz)    History of present illness:  Shelley [name] is a 50 y.o. female who presents to the ED with 3 week history of intermittent BRBPR. Patient has history of the same in the past. She was recently admitted in Feb, had colonoscopy which showed a questionable cecal AVM that was injected, but bleeding was believed to be more due to internal hemorrhoids.  Patient does have a history of severe internal hemorrhoids including blood loss anemia due to internal hemorrohoids in the past. Patient required surgery to band the internal hemorrohoids due to blood loss anemia back in 2014. This had worked well until [employer] of this year.  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, bleeding internal hemorrhoids, diverticula - s/p prbc transfusion, hgb has been stable,per RN last stool with a few blood streaks, but no frank blood observed - flexible sigmodoscopy without  anesthesia and without prep on 4/7, No significant findings, per GI Dr Elnoria Howard, patient need to follow up with general surgery as outpatient for hemorrhoids treatment  Blood loss anemia: hgb on presentation 5.4, received total of 4units of prbc since admission (1uint on 4/5, 3units of 4/6), hgb has been stable, no more active bleed  Active Problems:  Acute encephalopathy with h/o obesity hypoventilation and osa - patient was hard to be arouses,  ABG with hypercarbia on 4/6, - avoid narcotic meds or sedatives - use BIiPAP prn during day time and qhs - pt is clinically stable for now but needs close monitoring in stepdown unit -4/7 drowsy, 4/8 , 4/9, 4/10, 4/11 alert and pleasant  acute on chronic respiratory failure with hypoxia and hypercapnia: respiratory acidosis, With underline obesity hypoventilation and osa use BIiPAP prn during day time and qhs   Leukopenia - mild, improving   Morbid obesity due to excess calories  - Body mass index is 40.22 kg/(m^2).  HTN: her blood pressure has been stable without home meds, home meds losartan/hctz dose reduced from 100/25 to 50/12.5. pmd to continue adjust bp meds at follow up  noninsulin dependent dm2, metformin held in the hospital, resumed at discharge.  DVT prophylaxis - SCD's  Code Status: Full.  Family Communication: plan of care discussed with the patient Disposition Plan: Medically stable to discharge to SNF pending bed availability, in stepdown unit due to needing bipap at night,  Patient has baseline mental retardation, no immediate family, she has a legal guardian Laurina Bustle and personal  care giver [name],  i have left message for Onalee Hua and talked to [place] over  The phone, both agree to SNF placement.   Discharge Exam: BP 128/77 mmHg  Pulse 84  Temp(Src) 98.9 F (37.2 C) (Oral)  Resp 12  Ht  (1.702 m)  Wt 112.9 kg (248 lb 14.4 oz)  BMI 38.97 kg/m2  SpO2 99%   General: Obese, aaox3, does not seem to be  drowsy today, more interactive and pleasant.  Cardiovascular: Regular rate and rhythm, no rubs, no gallops  Respiratory: Clear to auscultation bilaterally, diminished breath sounds at bases   Abdomen: Soft, nontender, nondistended, bowel sounds present, no guarding   Discharge Instructions You were cared for by a hospitalist during your hospital stay. If you have any questions about your discharge medications or the care you received while you were in the hospital after you are discharged, you can call the unit and asked to speak with the hospitalist on call if the hospitalist that took care of you is not available. Once you are discharged, your primary care physician will handle any further medical issues. Please note that NO REFILLS for any discharge medications will be authorized once you are discharged, as it is imperative that you return to your primary care physician (or establish a relationship with a primary care physician if you do not have one) for your aftercare needs so that they can reassess your need for medications and monitor your lab values.      Discharge Instructions    Diet - low sodium heart healthy    Complete by:  As directed   Carb modified     Increase activity slowly    Complete by:  As directed      Increase activity slowly    Complete by:  As directed   Fall precaution            Medication List    STOP taking these medications        losartan-hydrochlorothiazide 100-25 MG tablet  Commonly known as:  HYZAAR  Replaced by:  losartan-hydrochlorothiazide 50-12.5 MG tablet     omeprazole 40 MG capsule  Commonly known as:  PRILOSEC      TAKE these medications        albuterol (2.5 MG/3ML) 0.083% nebulizer solution  Commonly known as:  PROVENTIL  Take 3 mLs (2.5 mg total) by nebulization every 6 (six) hours as needed for wheezing or shortness of breath.     budesonide 0.25 MG/2ML nebulizer solution  Commonly known as:  PULMICORT  INHALE 1 VIAL VIA  NEBULIZER TWICE DAILY.     famotidine 20 MG tablet  Commonly known as:  PEPCID  Take 1 tablet (20 mg total) by mouth at bedtime.     fenofibrate 160 MG tablet  Take 160 mg by mouth daily.     FERREX 150 150 MG capsule  Generic drug:  iron polysaccharides  Take 150 mg by mouth daily.     loratadine 10 MG tablet  Commonly known as:  CLARITIN  Take 10 mg by mouth daily as needed. For allergies     losartan-hydrochlorothiazide 50-12.5 MG tablet  Commonly known as:  HYZAAR  Take 1 tablet by mouth daily.     metFORMIN 500 MG tablet  Commonly known as:  GLUCOPHAGE  Take 1,000 mg by mouth every evening.     montelukast 10 MG tablet  Commonly known as:  SINGULAIR  Take 10 mg by mouth at bedtime.  rosuvastatin 10 MG tablet  Commonly known as:  CRESTOR  Take 10 mg by mouth daily.     senna-docusate 8.6-50 MG tablet  Commonly known as:  Senokot-S  Take 2 tablets by mouth at bedtime.     sertraline 50 MG tablet  Commonly known as:  ZOLOFT  Take 100 mg by mouth daily.       Allergies  Allergen Reactions  . Fish Allergy Swelling  . Pepto-Bismol [Bismuth Subsalicylate] Nausea And Vomiting  . Cortisone Itching and Rash    Injections and cream only.  PO ok.  . Trichophyton Itching    Also sneezing accompanying as well   Follow-up Information    Follow up with CORNETT,THOMAS A., MD In 2 weeks.   Specialty:  General Surgery   Why:  gi bleed from hemorrhoids   Contact information:   [address] [place] [address] [phone]       Schedule an appointment as soon as possible for a visit with [name],[name], MD.   Specialty:  Family Medicine   Why:  hospital discharge follow up, repeat cbc/bmp at follow up   Contact information:   [address] [place] [address] [phone]        The results of significant diagnostics from this hospitalization (including imaging, microbiology, ancillary and laboratory) are listed  below for reference.    Significant Diagnostic Studies: Ct Head Wo Contrast  09/18/2015  CLINICAL DATA:  Somnolent. History of mental retardation, hypertension, hyperlipidemia and diabetes. EXAM: CT HEAD WITHOUT CONTRAST TECHNIQUE: Contiguous axial images were obtained from the base of the skull through the vertex without intravenous contrast. COMPARISON:  None. FINDINGS: INTRACRANIAL CONTENTS: The ventricles and sulci are normal. No intraparenchymal hemorrhage, mass effect nor midline shift. No acute large vascular territory infarcts. No abnormal extra-axial fluid collections. Basal cisterns are patent. ORBITS: The included ocular globes and orbital contents are normal. SINUSES: The mastoid aircells and included paranasal sinuses are well-aerated. SKULL/SOFT TISSUES: No skull fracture. No significant soft tissue swelling. IMPRESSION: Negative CT head. Electronically Signed   By: Awilda Metroourtnay  Bloomer M.D.   On: 09/18/2015 01:00   Dg Chest Port 1 View  09/17/2015  CLINICAL DATA:  Lethargy EXAM: PORTABLE CHEST 1 VIEW COMPARISON:  03/07/2012 FINDINGS: Cardiomegaly is noted. Central mild vascular congestion without convincing pulmonary edema. No segmental infiltrate. Poor inspiration with bilateral basilar atelectasis left greater than right. IMPRESSION: Cardiomegaly. Central vascular congestion without convincing pulmonary edema. Poor inspiration. Bilateral basilar atelectasis left greater than right. No segmental infiltrate. Electronically Signed   By: Natasha MeadLiviu  Pop M.D.   On: 09/17/2015 20:02    Microbiology: Recent Results (from the past 240 hour(s))  MRSA PCR Screening     Status: None   Collection Time: 09/17/15  7:59 PM  Result Value Ref Range Status   MRSA by PCR NEGATIVE NEGATIVE Final    Comment:        The GeneXpert MRSA Assay (FDA approved for NASAL specimens only), is one component of a comprehensive MRSA colonization surveillance program. It is not intended to diagnose MRSA infection nor to  guide or monitor treatment for MRSA infections.      Labs: Basic Metabolic Panel:  Recent Labs Lab 09/17/15 1851 09/18/15 0651 09/19/15 0315 09/20/15 0248 09/21/15 0207  NA 144 143 146* 146* 145  K 3.9 4.5 4.0 3.8 4.1  CL 96* 95* 93* 92* 91*  CO2 40* 38* 44* 48* 47*  GLUCOSE 123* 165* 147*  160* 151*  BUN CREATININE 0.49 0.43* 0.46 0.47 0.49  CALCIUM 8.7* 8.5* 8.8* 8.7* 8.7*   Liver Function Tests:  Recent Labs Lab 09/16/15 1914 09/17/15 1851  AST 24 23  ALT 17 18  ALKPHOS 67 64  BILITOT 0.2* 0.9  PROT 7.3 7.0  ALBUMIN 3.3* 3.0*   No results for input(s): LIPASE, AMYLASE in the last 168 hours. No results for input(s): AMMONIA in the last 168 hours. CBC:  Recent Labs Lab 09/17/15 2156 09/18/15 0651 09/19/15 0315 09/20/15 0248 09/21/15 0207  WBC 2.8* 1.9* 2.8* 2.8* 3.1*  HGB 8.8* 9.0* 8.7* 8.7* 8.5*  HCT 29.8* 32.3* 32.4* 32.4* 31.4*  MCV 80.3 81.6 82.9 83.7 83.3  PLT 164 165 163 175 166   Cardiac Enzymes:  Recent Labs Lab 09/17/15 1851 09/18/15 0018 09/18/15 0651  TROPONINI <0.03 <0.03 <0.03   BNP: BNP (last 3 results) No results for input(s): BNP in the last 8760 hours.  ProBNP (last 3 results) No results for input(s): PROBNP in the last 8760 hours.  CBG:  Recent Labs Lab 09/21/15 0724 09/21/15 1125 09/21/15 1513 09/21/15 2134 09/22/15 0851  GLUCAP 149* 183* 190* 175* 127*       Signed:  Alfard Cochrane MD, PhD  [facility] 09/22/2015, 12:02 PM

## 2015-09-21 NOTE — Progress Notes (Signed)
Pt placed on bipap at this time, tolerating well. Will continue to monitor.

## 2015-09-22 ENCOUNTER — Encounter (HOSPITAL_COMMUNITY): Payer: Self-pay | Admitting: Gastroenterology

## 2015-09-22 DIAGNOSIS — Z9989 Dependence on other enabling machines and devices: Secondary | ICD-10-CM | POA: Insufficient documentation

## 2015-09-22 DIAGNOSIS — E662 Morbid (severe) obesity with alveolar hypoventilation: Secondary | ICD-10-CM | POA: Insufficient documentation

## 2015-09-22 DIAGNOSIS — Z8679 Personal history of other diseases of the circulatory system: Secondary | ICD-10-CM

## 2015-09-22 DIAGNOSIS — E119 Type 2 diabetes mellitus without complications: Secondary | ICD-10-CM | POA: Insufficient documentation

## 2015-09-22 DIAGNOSIS — D62 Acute posthemorrhagic anemia: Secondary | ICD-10-CM | POA: Insufficient documentation

## 2015-09-22 DIAGNOSIS — F79 Unspecified intellectual disabilities: Secondary | ICD-10-CM

## 2015-09-22 DIAGNOSIS — K922 Gastrointestinal hemorrhage, unspecified: Secondary | ICD-10-CM | POA: Insufficient documentation

## 2015-09-22 LAB — GLUCOSE, CAPILLARY
GLUCOSE-CAPILLARY: 127 mg/dL — AB (ref 65–99)
GLUCOSE-CAPILLARY: 147 mg/dL — AB (ref 65–99)

## 2015-09-22 NOTE — Clinical Social Work Placement (Signed)
   CLINICAL SOCIAL WORK PLACEMENT  NOTE  Date:  09/22/2015  Patient Details  Name: Shelley [name] MRN: [id number] Date of Birth: 04/19/1966  Clinical Social Work is seeking post-discharge placement for this patient at the Skilled  Nursing Facility level of care (*CSW will initial, date and re-position this form in  chart as items are completed):  Yes   Patient/family provided with [place] Clinical Social Work Department's list of facilities offering this level of care within the geographic area requested by the patient (or if unable, by the patient's family).  Yes   Patient/family informed of their freedom to choose among providers that offer the needed level of care, that participate in Medicare, Medicaid or managed care program needed by the patient, have an available bed and are willing to accept the patient.  Yes   Patient/family informed of 's ownership interest in [facility] and [facility], as well as of the fact that they are under no obligation to receive care at these facilities.  PASRR submitted to EDS on 09/22/15     PASRR number received on 09/22/15     Existing PASRR number confirmed on       FL2 transmitted to all facilities in geographic area requested by pt/family on 09/22/15     FL2 transmitted to all facilities within larger geographic area on       Patient informed that his/her managed care company has contracts with or will negotiate with certain facilities, including the following:        Yes   Patient/family informed of bed offers received.  Patient [facility] bed at  [facility]     Physician recommends and patient chooses bed at      Patient to be transferred to  [facility] on 09/22/15.  Patient to be transferred to facility by  Sharin Mons([facility] )     Patient family notified on 09/22/15 of transfer.  Name of family member notified:   (Pt's POA, Onalee HuaDavid and caregiver, Kennon RoundsSally )      PHYSICIAN Please sign FL2, Please prepare priority discharge summary, including medications     Additional Comment:    _______________________________________________ Vaughan BrownerNixon, Korin Setzler A, LCSW 09/22/2015, 3:36 PM

## 2015-09-22 NOTE — Progress Notes (Signed)
Discharge orders received, patient stable with no S/S of distress.  [facility] and given to Joaquin MusicErica Patterson.  [facility] transported patient to SNF.

## 2015-09-22 NOTE — [place] FL2 (Signed)
[place] MEDICAID FL2 LEVEL OF CARE SCREENING TOOL     IDENTIFICATION  Patient Name: Shelley [name] Birthdate: 11/30/1965 Sex: female Admission Date (Current Location): 09/16/2015  [place]:  [place]  ([facility]/[place] MEDICARE- [id number]) Facility and Address:  [facility], [address], [place], [place] [address]      Provider Number: [id number]  Attending Physician Name and Address:  Albertine GratesFang Avilene Marrin, MD  Relative Name and Phone Number:   Leodis Rains(Ezzell,Sally- [phone]    Current Level of Care: Hospital Recommended Level of Care: Skilled Nursing Facility Prior Approval Number:    Date Approved/Denied:   PASRR Number:    Discharge Plan: SNF    Current Diagnoses: Patient Active Problem List   Diagnosis Date Noted  . Symptomatic anemia 07/30/2015  . Lower GI bleed 07/29/2015  . Diverticulosis 07/29/2015  . History of arteriovenous malformation (AVM) 07/29/2015  . GERD (gastroesophageal reflux disease) 07/29/2015  . Chronic diarrhea 07/29/2015  . Obesity, Class III, BMI 40-49.9 (morbid obesity) (HCC) 03/06/2013  . Hemorrhoids 03/06/2013  . Dependence on supplemental oxygen 03/06/2013  . Anxiety   . Mental retardation   . Iron deficiency anemia due to chronic blood loss 02/06/2013  . Diabetes mellitus (HCC) 02/06/2013  . Physical deconditioning 11/08/2012  . Developmental delay 12/02/2011  . Obstructive sleep apnea 12/02/2011  . Chronic respiratory failure (HCC) 12/02/2011  . ALLERGIC RHINITIS 08/02/2007  . Asthma, persistent controlled 08/02/2007    Orientation RESPIRATION BLADDER Height & Weight     Place, Time, Self  O2 (3L/min) Continent Weight: 248 lb 14.4 oz (112.9 kg) Height:  5\' 7"  (170.2 cm)  BEHAVIORAL SYMPTOMS/MOOD NEUROLOGICAL BOWEL NUTRITION STATUS      Continent Diet (LOW SODIUM, HEART HEALTHY)  AMBULATORY STATUS COMMUNICATION OF NEEDS Skin   Limited Assist   Normal                        Personal Care Assistance Level of Assistance              Functional Limitations Info             SPECIAL CARE FACTORS FREQUENCY  PT (By licensed PT)     PT Frequency:  (2x/week)              Contractures      Additional Factors Info  Allergies, Code Status Code Status Info:  (FULL) Allergies Info:  (Fish Allergy, Pepto-bismol, Cortisone, Trichophyton)           Current Medications (09/22/2015):  This is the current hospital active medication list Current Facility-Administered Medications  Medication Dose Route Frequency Provider Last Rate Last Dose  . albuterol (PROVENTIL) (2.5 MG/3ML) 0.083% nebulizer solution 2.5 mg  2.5 mg Nebulization Q6H PRN Hillary BowJared M Gardner, DO      . budesonide (PULMICORT) nebulizer solution 0.25 mg  0.25 mg Nebulization BID Hillary BowJared M Gardner, DO   0.25 mg at 09/22/15 0802  . fenofibrate tablet 160 mg  160 mg Oral Daily Hillary BowJared M Gardner, DO   160 mg at 09/22/15 [id number]  . insulin aspart (novoLOG) injection 0-9 Units  0-9 Units Subcutaneous TID WC Jinger NeighborsMary A Lynch, NP   1 Units at 09/22/15 0904  . iron polysaccharides (NIFEREX) capsule 150 mg  150 mg Oral Daily Hillary BowJared M Gardner, DO   150 mg at 09/22/15 [id number]  . loratadine (CLARITIN) tablet 10 mg  10 mg Oral Daily PRN Hillary BowJared M Gardner,  DO      . montelukast (SINGULAIR) tablet 10 mg  10 mg Oral QHS Hillary Bow, DO   10 mg at 09/21/15 2149  . pantoprazole (PROTONIX) EC tablet 80 mg  80 mg Oral Daily Hillary Bow, DO   80 mg at 09/22/15 1610  . rosuvastatin (CRESTOR) tablet 10 mg  10 mg Oral Daily Hillary Bow, DO   10 mg at 09/22/15 9604  . senna-docusate (Senokot-S) tablet 2 tablet  2 tablet Oral QHS Albertine Grates, MD   2 tablet at 09/21/15 2149  . sertraline (ZOLOFT) tablet 100 mg  100 mg Oral Daily Hillary Bow, DO   100 mg at 09/22/15 5409     Discharge Medications: Please see discharge summary for a list of discharge medications.  Relevant Imaging Results:  Relevant Lab  Results:   Additional Information  (SSN: [id number])  Orson Gear, Student-SW [phone]

## 2015-09-22 NOTE — Progress Notes (Signed)
Spoke w poa david routh late around 5pm on 4-10. He states if md and therapy feel need short term snf he is in agreement. Alerted nse teresa and ask her to let dr Roda Shuttersxu know that poa will agree to snf. Alerted sw bashira that poa agreed to snf and she was starting on bed search and pasar 4-10. Cont to follow.

## 2015-09-22 NOTE — Clinical Social Work Note (Signed)
Clinical Social Worker facilitated patient discharge including contacting patient family and facility to confirm patient discharge plans.  Clinical information faxed to facility and family agreeable with plan.  CSW arranged ambulance [facility] via [facility].  RN to call report prior to discharge.  Clinical Social Worker will sign off for now as social work intervention is no longer needed. Please consult us again if new need arises.  Derenda FennelBashira Leyli Kevorkian, MSW, LCSWA [phone] 09/22/2015 3:36 PM

## 2015-09-22 NOTE — Clinical Social Work Note (Signed)
[facility] has a [facility]. [employer] insurance authorization request submit. CSW awaiting returned phone call from [facility] representative. POA agreeable with placement.   CSW remains available as needed.   Shelley [name], MSW, LCSWA [phone] 09/22/2015 12:36 PM

## 2015-09-22 NOTE — Clinical Social Work Placement (Signed)
   CLINICAL SOCIAL WORK PLACEMENT  NOTE  Date:  09/22/2015  Patient Details  Name: Vickii Pennaenny A Berens MRN: [id number] Date of Birth: 09/01/1965  Clinical Social Work is seeking post-discharge placement for this patient at the Skilled  Nursing Facility level of care (*CSW will initial, date and re-position this form in  chart as items are completed):  Yes   Patient/family provided with [facility] Clinical Social Work Department's list of facilities offering this level of care within the geographic area requested by the patient (or if unable, by the patient's family).  Yes   Patient/family informed of their freedom to choose among providers that offer the needed level of care, that participate in Medicare, Medicaid or managed care program needed by the patient, have an available bed and are willing to accept the patient.  Yes   Patient/family informed of 's ownership interest in [facility] and [facility], as well as of the fact that they are under no obligation to receive care at these facilities.  PASRR submitted to EDS on       PASRR number received on       Existing PASRR number confirmed on       FL2 transmitted to all facilities in geographic area requested by pt/family on 09/22/15     FL2 transmitted to all facilities within larger geographic area on       Patient informed that his/her managed care company has contracts with or will negotiate with certain facilities, including the following:            Patient/family informed of bed offers received.  Patient chooses bed at       Physician recommends and patient chooses bed at      Patient to be transferred to   on  .  Patient to be transferred to facility by       Patient family notified on   of transfer.  Name of family member notified:        PHYSICIAN Please sign FL2     Additional Comment:    _______________________________________________ Orson Gearatiana Merrillyn Ackerley, Student-SW 09/22/2015, 11:16 AM

## 2015-09-23 ENCOUNTER — Ambulatory Visit: Payer: Self-pay | Admitting: Pulmonary Disease

## 2015-09-24 ENCOUNTER — Non-Acute Institutional Stay (SKILLED_NURSING_FACILITY): Payer: Medicare Other | Admitting: Internal Medicine

## 2015-09-24 ENCOUNTER — Encounter: Payer: Self-pay | Admitting: Internal Medicine

## 2015-09-24 DIAGNOSIS — Z8774 Personal history of (corrected) congenital malformations of heart and circulatory system: Secondary | ICD-10-CM

## 2015-09-24 DIAGNOSIS — R6889 Other general symptoms and signs: Secondary | ICD-10-CM

## 2015-09-24 DIAGNOSIS — Z9989 Dependence on other enabling machines and devices: Secondary | ICD-10-CM | POA: Diagnosis not present

## 2015-09-24 DIAGNOSIS — G4733 Obstructive sleep apnea (adult) (pediatric): Secondary | ICD-10-CM

## 2015-09-24 DIAGNOSIS — E119 Type 2 diabetes mellitus without complications: Secondary | ICD-10-CM | POA: Diagnosis not present

## 2015-09-24 DIAGNOSIS — J9610 Chronic respiratory failure, unspecified whether with hypoxia or hypercapnia: Secondary | ICD-10-CM | POA: Diagnosis not present

## 2015-09-24 DIAGNOSIS — Z8679 Personal history of other diseases of the circulatory system: Secondary | ICD-10-CM

## 2015-09-24 DIAGNOSIS — E662 Morbid (severe) obesity with alveolar hypoventilation: Secondary | ICD-10-CM

## 2015-09-24 DIAGNOSIS — F419 Anxiety disorder, unspecified: Secondary | ICD-10-CM

## 2015-09-24 DIAGNOSIS — D500 Iron deficiency anemia secondary to blood loss (chronic): Secondary | ICD-10-CM

## 2015-09-24 DIAGNOSIS — K649 Unspecified hemorrhoids: Secondary | ICD-10-CM | POA: Diagnosis not present

## 2015-09-24 NOTE — Progress Notes (Signed)
Patient ID: Shelley [name], female   DOB: 03-19-1966, 50 y.o.   MRN: [id number]    HISTORY AND PHYSICAL   DATE: 09/24/15  Location:  [facility]    Place of Service: SNF (31)   Extended Emergency Contact Information Primary Emergency Contact: Acquanetta [place] Home Phone: [phone] Mobile Phone: [phone] Relation: None Secondary Emergency Contact: Ruth,David  [place] Mobile Phone: [phone] Relation: None  Advanced Directive information  FULL CODE  Chief Complaint  Patient presents with  . New Admit To SNF    HPI:  50 yo female seen today as a new admission into SNF following hospital stay for LGIB, symptomatic anemia, hx AVMs, internal hemorrhoids, acute encephalopathy, OSA, OHS, morbid obesity, DM, HTN and GERD. She presented to the ED with BRBPR. Hgb 5.4. She was transfused total of 4 units during admission with d/c Hgb 8.5.  She had ABG that revealed hypercarbia and BiPap was initiated. She had a flex sig without prep or sedation that revealed no significant findings. Internal hemorrhoids noted. Albumin 3.0. Cr 0.49  Today she reports left arm pain that is chronic. She has chronic SOB. She is using BiPap qhs. No nursing issues. No f/c. No change in urinary habits. No bloody stools. She is a poor historian due to mentally challenged. Hx obtained from chart  Asthma/chronic respiratory failure - she is O2 dependent and hypercarbic. Takes albuterol neb prn and uses pulmicort neb BID. She takes singulair and loratidine.   OHS/OSA - stable on BiPAP qhs  HTN - BP controlled on losartan HCT  Fe deficiency anemia - stable on ferrex. Takes senna qhs  DM - CBG 183 today. No low BS reactions. She takes metformin  GERD - stable on pepcid  Hyperlipidemia - stable on fenofibrate and crestor  Anxiety/mentally challenged - mood stable on zoloft  Past Medical History  Diagnosis Date  . Asthma   . Diabetes mellitus     . Hypertension   . Hyperlipidemia   . Pneumonia   . Anxiety   . GERD (gastroesophageal reflux disease)   . Arthritis   . Mental retardation   . Hiatal hernia   . AVM (arteriovenous malformation) of colon   . Hemorrhoids     Internal and external  . Diverticulosis   . GI bleed 09/2015    Past Surgical History  Procedure Laterality Date  . Cholecystectomy      2006  . Eye surgery    . Colonoscopy N/A 02/06/2013    Procedure: COLONOSCOPY;  Surgeon: Theda Belfast, MD;  Location: [facility] ENDOSCOPY;  Service: Endoscopy;  Laterality: N/A;  . Esophagogastroduodenoscopy N/A 02/06/2013    Procedure: ESOPHAGOGASTRODUODENOSCOPY (EGD);  Surgeon: Theda Belfast, MD;  Location: [facility];  Service: Endoscopy;  Laterality: N/A;  . Colonoscopy N/A 07/31/2015    Procedure: COLONOSCOPY;  Surgeon: Jeani Hawking, MD;  Location: [facility] ENDOSCOPY;  Service: Endoscopy;  Laterality: N/A;  . Hot hemostasis N/A 07/31/2015    Procedure: HOT HEMOSTASIS (ARGON PLASMA COAGULATION/BICAP);  Surgeon: Jeani Hawking, MD;  Location[facility] END[facility];  Service: Endoscopy;  Laterality: N/A;  . Flexible sigmoidoscopy N/A 09/18/2015    Procedure: FLEXIBLE SIGMOIDOSCOPY;  Surgeon: Jeani Hawking, MD;  Location[facility] END[facility]COPY;  Service: Endoscopy;  Laterality: N/A;    Patient Care Team: Maurice Small, MD as PCP - General (Family Medicine) Jeani Hawking, MD as Consulting Physician (Gastroenterology) Lucy Antigua, MD as Consulting Physician (Internal Medicine) Karie Soda, MD as Consulting Physician (  General Surgery) Graylin Shiver, MD as Consulting Physician (Gastroenterology) [name], MD as Consulting Physician (Pulmonary Disease)  Social History   Social History  . Marital Status: Single    Spouse Name: N/A  . Number of Children: N/A  . Years of Education: N/A   Occupational History  . Not on file.   Social History Main Topics  . Smoking status: Never Smoker   . Smokeless tobacco: Never Used  . Alcohol Use: No  .  Drug Use: No  . Sexual Activity: Not on file   Other Topics Concern  . Not on file   Social History Narrative   Lives at [facility] independent living facility along with her "aunt" [name] (actually just a friend) who was originally her HCPOA. "Cousin"/friend [name] has now taken over HCPOA  as aunt with Dementia.       Patient is DNR/DNI    PCP Dr. Duanne Guess on [address] in [place].                  reports that she has never smoked. She has never used smokeless tobacco. She reports that she does not drink alcohol or use illicit drugs.  Family History  Problem Relation Age of Onset  . COPD Mother    No family status information on file.    Immunization History  Administered Date(s) Administered  . Influenza Split 03/07/2012, 03/13/2013  . Influenza Whole 03/14/2011  . Influenza,inj,Quad PF,36+ Mos 03/13/2014, 03/13/2015  . Pneumococcal-Unspecified 03/13/2010    Allergies  Allergen Reactions  . Fish Allergy Swelling  . Pepto-Bismol [Bismuth Subsalicylate] Nausea And Vomiting  . Cortisone Itching and Rash    Injections and cream only.  PO ok.  . Trichophyton Itching    Also sneezing accompanying as well    Medications: Patient's Medications  New Prescriptions   No medications on file  Previous Medications   ALBUTEROL (PROVENTIL) (2.5 MG/3ML) 0.083% NEBULIZER SOLUTION    Take 3 mLs (2.5 mg total) by nebulization every 6 (six) hours as needed for wheezing or shortness of breath.   BUDESONIDE (PULMICORT) 0.25 MG/2ML NEBULIZER SOLUTION    INHALE 1 VIAL VIA NEBULIZER TWICE DAILY.   FAMOTIDINE (PEPCID) 20 MG TABLET    Take 1 tablet (20 mg total) by mouth at bedtime.   FENOFIBRATE 160 MG TABLET    Take 160 mg by mouth daily.   FERREX 150 150 MG CAPSULE    Take 150 mg by mouth daily.   LORATADINE (CLARITIN) 10 MG TABLET    Take 10 mg by mouth daily as needed. For allergies   LOSARTAN-HYDROCHLOROTHIAZIDE (HYZAAR) 50-12.5 MG TABLET    Take 1 tablet by mouth daily.    METFORMIN (GLUCOPHAGE) 500 MG TABLET    Take 1,000 mg by mouth every evening.    MONTELUKAST (SINGULAIR) 10 MG TABLET    Take 10 mg by mouth at bedtime.   ROSUVASTATIN (CRESTOR) 10 MG TABLET    Take 10 mg by mouth daily.   SENNA-DOCUSATE (SENOKOT-S) 8.6-50 MG TABLET    Take 2 tablets by mouth at bedtime.   SERTRALINE (ZOLOFT) 50 MG TABLET    Take 100 mg by mouth daily.   Modified Medications   No medications on file  Discontinued Medications   No medications on file    Review of Systems  Unable to perform ROS: Psychiatric disorder  mentally challenged  Filed Vitals:   09/24/15 1632  BP: 122/80  Pulse: 90  Temp: 100.2 F (37.9  C)   There is no weight on file to calculate BMI.  Physical Exam  Constitutional: She is oriented to person, place, and time. She appears well-developed.  Frail appearing in NAD. Sitting in w/c. Avant O2 intact  HENT:  Mouth/Throat: Oropharynx is clear and moist. No oropharyngeal exudate.  Eyes: Pupils are equal, round, and reactive to light. No scleral icterus.  Neck: Neck supple. Carotid bruit is not present. No tracheal deviation present. No thyromegaly present.  Cardiovascular: Normal rate, regular rhythm and intact distal pulses.  Exam reveals no gallop and no friction rub.   Murmur (1/6 SEM) heard. No LE edema b/l. no calf TTP.   Pulmonary/Chest: Effort normal. No accessory muscle usage or stridor. No respiratory distress. She has decreased breath sounds (b/l at base). She has no wheezes. She has no rhonchi. She has no rales.  Abdominal: Soft. Bowel sounds are normal. She exhibits no distension and no mass. There is no hepatomegaly. There is no tenderness. There is no rebound and no guarding.  Musculoskeletal: She exhibits edema.  Lymphadenopathy:    She has no cervical adenopathy.  Neurological: She is alert and oriented to person, place, and time.  Skin: Skin is warm and dry. No rash noted.  Psychiatric: She has a normal mood and affect. Her  behavior is normal.     Labs reviewed: Admission on 09/16/2015, Discharged on 09/22/2015  No results displayed because visit has over 200 results.     Ct Head Wo Contrast  09/18/2015  CLINICAL DATA:  Somnolent. History of mental retardation, hypertension, hyperlipidemia and diabetes. EXAM: CT HEAD WITHOUT CONTRAST TECHNIQUE: Contiguous axial images were obtained from the base of the skull through the vertex without intravenous contrast. COMPARISON:  None. FINDINGS: INTRACRANIAL CONTENTS: The ventricles and sulci are normal. No intraparenchymal hemorrhage, mass effect nor midline shift. No acute large vascular territory infarcts. No abnormal extra-axial fluid collections. Basal cisterns are patent. ORBITS: The included ocular globes and orbital contents are normal. SINUSES: The mastoid aircells and included paranasal sinuses are well-aerated. SKULL/SOFT TISSUES: No skull fracture. No significant soft tissue swelling. IMPRESSION: Negative CT head. Electronically Signed   By: Awilda Metroourtnay  Bloomer M.D.   On: 09/18/2015 01:00   Dg Chest Port 1 View  09/17/2015  CLINICAL DATA:  Lethargy EXAM: PORTABLE CHEST 1 VIEW COMPARISON:  03/07/2012 FINDINGS: Cardiomegaly is noted. Central mild vascular congestion without convincing pulmonary edema. No segmental infiltrate. Poor inspiration with bilateral basilar atelectasis left greater than right. IMPRESSION: Cardiomegaly. Central vascular congestion without convincing pulmonary edema. Poor inspiration. Bilateral basilar atelectasis left greater than right. No segmental infiltrate. Electronically Signed   By: Natasha MeadLiviu  Pop M.D.   On: 09/17/2015 20:02     Assessment/Plan   ICD-9-CM ICD-10-CM   1. Abnormal body temperature 780.99 R68.89   2. Chronic respiratory failure, unspecified whether with hypoxia or hypercapnia (HCC) 518.83 J96.10   3. Obstructive sleep apnea 327.23 G47.33   4. History of arteriovenous malformation (AVM) V12.59 Z86.79    with recent LGIB  5.  BiPAP (biphasic positive airway pressure) dependence V46.8 Z99.89   6. Anxiety 300.00 F41.9   7. Diabetes mellitus type 2, noninsulin dependent (HCC) 250.00 E11.9   8. Hemorrhoids, unspecified hemorrhoid type 455.6 K64.9   9. Obesity hypoventilation syndrome (HCC) 278.03 E66.2   10. Obesity, Class III, BMI 40-49.9 (morbid obesity) (HCC) 278.01 E66.01   11. Iron deficiency anemia due to chronic blood loss 280.0 D50.0    s/p 4 units PRBCs   Monitor temps as  she has a low grade temp today. T/c further w/u if persists or worsens. No clinical sign of infection on exam today  F/u with surgery for internal hemorrhoids  Repeat CBC and BMP in 1 week  Use Bipap qhs  Cont other meds as ordered  PT/OT/ST as ordered  GOAL: short term rehab and d/c home when medically appropriate. Communicated with pt and nursing.  Will follow  Jamael Hoffmann S. Ancil Linsey  [facility] [address] [place], [place] [address] [phone] Cell (Monday-Friday 8 AM - 5 PM) [phone] After 5 PM and follow prompts

## 2015-10-28 ENCOUNTER — Encounter: Payer: Self-pay | Admitting: Adult Health

## 2015-10-28 ENCOUNTER — Non-Acute Institutional Stay (SKILLED_NURSING_FACILITY): Payer: Medicare Other | Admitting: Adult Health

## 2015-10-28 DIAGNOSIS — F79 Unspecified intellectual disabilities: Secondary | ICD-10-CM | POA: Diagnosis not present

## 2015-10-28 DIAGNOSIS — J9610 Chronic respiratory failure, unspecified whether with hypoxia or hypercapnia: Secondary | ICD-10-CM

## 2015-10-28 DIAGNOSIS — D62 Acute posthemorrhagic anemia: Secondary | ICD-10-CM | POA: Diagnosis not present

## 2015-10-28 DIAGNOSIS — R5381 Other malaise: Secondary | ICD-10-CM

## 2015-10-28 DIAGNOSIS — K649 Unspecified hemorrhoids: Secondary | ICD-10-CM | POA: Diagnosis not present

## 2015-10-28 DIAGNOSIS — E119 Type 2 diabetes mellitus without complications: Secondary | ICD-10-CM | POA: Diagnosis not present

## 2015-10-28 NOTE — Progress Notes (Signed)
Location:  [facility] Room Number: 220 Place of Service:  SNF [phone])  Provider:  Peggye Ley, ANP [facility] [phone]   PCP: Astrid Divine, MD Patient Care Team: Maurice Small, MD as PCP - General (Family Medicine) Jeani Hawking, MD as Consulting Physician (Gastroenterology) Lucy Antigua, MD as Consulting Physician (Internal Medicine) Karie Soda, MD as Consulting Physician (General Surgery) Graylin Shiver, MD as Consulting Physician (Gastroenterology) [name], MD as Consulting Physician (Pulmonary Disease)  Extended Emergency Contact Information Primary Emergency Contact: Acquanetta [place] Home Phone: [phone] Mobile Phone: [phone] Relation: None Secondary Emergency Contact: Ruth,David  [place] Mobile Phone: [phone] Relation: None  Code Status: Full Code Goals of care:  Advanced Directive information Advanced Directives 10/28/2015  Does patient have an advance directive? Yes  Type of Advance Directive Healthcare Power of Attorney  Does patient want to make changes to advanced directive? No - Patient declined  Copy of advanced directive(s) in chart? Yes  Would patient like information on creating an advanced directive? -     Allergies  Allergen Reactions  . Fish Allergy Swelling  . Pepto-Bismol [Bismuth Subsalicylate] Nausea And Vomiting  . Cortisone Itching and Rash    Injections and cream only.  PO ok.  . Trichophyton Itching    Also sneezing accompanying as well    Chief Complaint  Patient presents with  . Discharge Note    HPI:  50 y.o. female  Seen for evaluation for discharge from [facility]. Resident was admitted in April of 2017 after a hospitalization for LGIB, symptomatic anemia, hx AVMs, internal hemorrhoids, acute encephalopathy, OSA, OHS, morbid obesity, DM, HTN and GERD. She presented to the ED with BRBPR. Hgb 5.4. She was transfused  total of 4 units during admission with d/c Hgb 8.5. She had ABG that revealed hypercarbia and BiPap was initiated. She had a flex sig without prep or sedation that revealed no significant findings. Internal hemorrhoids noted. She has remained on bipap with 4L of oxygen in the evening and 3L nasal canula during the day. She denies sob or cough. She has a hx of MR and is a poor historian. Staff denies any further bleeding or abd pain.     Past Medical History  Diagnosis Date  . Asthma   . Diabetes mellitus   . Hypertension   . Hyperlipidemia   . Pneumonia   . Anxiety   . GERD (gastroesophageal reflux disease)   . Arthritis   . Mental retardation   . Hiatal hernia   . AVM (arteriovenous malformation) of colon   . Hemorrhoids     Internal and external  . Diverticulosis   . GI bleed 09/2015    Past Surgical History  Procedure Laterality Date  . Cholecystectomy      2006  . Eye surgery    . Colonoscopy N/A 02/06/2013    Procedure: COLONOSCOPY;  Surgeon: Theda Belfast, MD;  Location: [facility] ENDOSCOPY;  Service: Endoscopy;  Laterality: N/A;  . Esophagogastroduodenoscopy N/A 02/06/2013    Procedure: ESOPHAGOGASTRODUODENOSCOPY (EGD);  Surgeon: Theda Belfast, MD;  Location: [facility] ENDOSCOPY;  Service: Endoscopy;  Laterality: N/A;  . Colonoscopy N/A 07/31/2015    Procedure: COLONOSCOPY;  Surgeon: Jeani Hawking, MD;  Location[facility] ENDOSCOPY;  Service: Endoscopy;  Laterality: N/A;  . Hot hemostasis N/A 07/31/2015    Procedure: HOT HEMOSTASIS (ARGON PLASMA COAGULATION/BICAP);  Surgeon: Jeani Hawking, MD;  Location: [facility] ENDOSCOPY;  Service: Endoscopy;  Laterality: N/A;  . Flexible sigmoidoscopy N/A 09/18/2015    Procedure: FLEXIBLE SIGMOIDOSCOPY;  Surgeon: Jeani Hawking, MD;  Location: [facility] ENDOSCOPY;  Service: Endoscopy;  Laterality: N/A;      reports that she has never smoked. She has never used smokeless tobacco. She reports that she does not drink alcohol or use illicit drugs. Social History   Social  History  . Marital Status: Single    Spouse Name: N/A  . Number of Children: N/A  . Years of Education: N/A   Occupational History  . Not on file.   Social History Main Topics  . Smoking status: Never Smoker   . Smokeless tobacco: Never Used  . Alcohol Use: No  . Drug Use: No  . Sexual Activity: Not on file   Other Topics Concern  . Not on file   Social History Narrative   Lives at [facility] independent living facility along with her "aunt" [name] (actually just a friend) who was originally her HCPOA. "Cousin"/friend [name] has now taken over HCPOA  as aunt with Dementia.       Patient is DNR/DNI    PCP Dr. Duanne Guess on [address] in [place].                Functional Status Survey:    Allergies  Allergen Reactions  . Fish Allergy Swelling  . Pepto-Bismol [Bismuth Subsalicylate] Nausea And Vomiting  . Cortisone Itching and Rash    Injections and cream only.  PO ok.  . Trichophyton Itching    Also sneezing accompanying as well    Pertinent  Health Maintenance Due  Topic Date Due  . FOOT EXAM  07/20/1975  . OPHTHALMOLOGY EXAM  07/20/1975  . PAP SMEAR  07/19/1986  . HEMOGLOBIN A1C  08/09/2013  . MAMMOGRAM  07/20/2015  . INFLUENZA VACCINE  01/12/2016  . COLONOSCOPY  07/30/2025    Medications:   Medication List       This list is accurate as of: 10/28/15  8:58 AM.  Always use your most recent med list.               albuterol (2.5 MG/3ML) 0.083% nebulizer solution  Commonly known as:  PROVENTIL  Take 3 mLs (2.5 mg total) by nebulization every 6 (six) hours as needed for wheezing or shortness of breath.     budesonide 0.25 MG/2ML nebulizer solution  Commonly known as:  PULMICORT  Take 0.25 mg by nebulization 2 (two) times daily.     famotidine 20 MG tablet  Commonly known as:  PEPCID  Take 1 tablet (20 mg total) by mouth at bedtime.     fenofibrate 160 MG tablet  Take 160 mg by mouth daily.     FERREX 150 150 MG capsule  Generic drug:   iron polysaccharides  Take 150 mg by mouth daily.     loratadine 10 MG tablet  Commonly known as:  CLARITIN  Take 10 mg by mouth daily as needed. For allergies     losartan-hydrochlorothiazide 50-12.5 MG tablet  Commonly known as:  HYZAAR  Take 1 tablet by mouth daily.     metFORMIN 500 MG tablet  Commonly known as:  GLUCOPHAGE  Take 1,000 mg by mouth every evening.     montelukast 10 MG tablet  Commonly known as:  SINGULAIR  Take 10 mg by mouth at bedtime.     rosuvastatin 10 MG tablet  Commonly known as:  CRESTOR  Take 10 mg by mouth daily.  senna-docusate 8.6-50 MG tablet  Commonly known as:  Senokot-S  Take 2 tablets by mouth at bedtime.     sertraline 50 MG tablet  Commonly known as:  ZOLOFT  Take 100 mg by mouth daily.        Review of Systems  Constitutional: Negative for fever, chills, diaphoresis, activity change, appetite change, fatigue and unexpected weight change.  HENT: Negative.   Respiratory: Negative for cough, shortness of breath and wheezing.   Cardiovascular: Negative for chest pain, palpitations and leg swelling.  Gastrointestinal: Negative for nausea, vomiting, diarrhea, constipation, blood in stool, abdominal distention, anal bleeding and rectal pain.  Genitourinary: Negative for dysuria and difficulty urinating.  Musculoskeletal: Positive for gait problem. Negative for back pain and arthralgias.  Skin: Negative for rash.  Neurological: Negative for dizziness and facial asymmetry.  Psychiatric/Behavioral: Negative for behavioral problems, confusion and agitation.    Filed Vitals:   10/28/15 0833  BP: 121/74  Pulse: 86  Temp: 97.2 F (36.2 C)  TempSrc: Oral  Resp: 18  Height: 5\' 7"  (1.702 m)  Weight: 244 lb (110.678 kg)   Body mass index is 38.21 kg/(m^2). Physical Exam  Constitutional: She is oriented to person, place, and time.  HENT:  Head: Normocephalic and atraumatic.  Eyes: Conjunctivae are normal. Pupils are equal, round,  and reactive to light. Right eye exhibits no discharge. Left eye exhibits no discharge.  Neck: Normal range of motion. Neck supple. No JVD present.  Pulmonary/Chest: Effort normal. No respiratory distress. She has no wheezes.  Decreased throughout  Abdominal: Soft. Bowel sounds are normal. She exhibits no distension.  Genitourinary:  Rectal exam normal  Musculoskeletal: She exhibits no edema or tenderness.  Neurological: She is alert and oriented to person, place, and time.  Skin: Skin is warm and dry.  Psychiatric: She has a normal mood and affect.    Labs reviewed: Basic Metabolic Panel:  Recent Labs  84/16/6002/15/17 2314  09/19/15 0315 09/20/15 0248 09/21/15 0207  NA  --   < > 146* 146* 145  K  --   < > 4.0 3.8 4.1  CL  --   < > 93* 92* 91*  CO2  --   < > 44* 48* 47*  GLUCOSE  --   < > 147* 160* 151*  BUN  --   < > 6 9 9   CREATININE  --   < > 0.46 0.47 0.49  CALCIUM  --   < > 8.8* 8.7* 8.7*  MG 1.8  --   --   --   --   PHOS 3.4  --   --   --   --   < > = values in this interval not displayed. Liver Function Tests:  Recent Labs  07/29/15 1608 09/16/15 1914 09/17/15 1851  AST 21 24 23   ALT 16 17 18   ALKPHOS 70 67 64  BILITOT 0.3 0.2* 0.9  PROT 7.1 7.3 7.0  ALBUMIN 3.2* 3.3* 3.0*   No results for input(s): LIPASE, AMYLASE in the last 8760 hours. No results for input(s): AMMONIA in the last 8760 hours. CBC:  Recent Labs  09/19/15 0315 09/20/15 0248 09/21/15 0207  WBC 2.8* 2.8* 3.1*  HGB 8.7* 8.7* 8.5*  HCT 32.4* 32.4* 31.4*  MCV 82.9 83.7 83.3  PLT 163 175 166   Cardiac Enzymes:  Recent Labs  09/17/15 1851 09/18/15 0018 09/18/15 0651  TROPONINI <0.03 <0.03 <0.03   BNP: Invalid input(s): POCBNP CBG:  Recent Labs  09/21/15  2134 09/22/15 0851 09/22/15 1211  GLUCAP 175* 127* 147*    Procedures and Imaging Studies During Stay: No results found.  Assessment/Plan:    1. Hemorrhoids, unspecified hemorrhoid type -no further bleeding or  pain -f/u with GI 6/8  2. Acute blood loss anemia -needs f/u CBC prior to discharge, I let the staff know to draw it stat and call me at [phone] with the results  3. Chronic respiratory failure, unspecified whether with hypoxia or hypercapnia (HCC) -no reports of dyspnea or hypoxia -due to obesity hypoventilation and sleep apnea -continue oxygen during the day and bipap a night -continue pulmicort and albuterol  4. Diabetes mellitus type 2, noninsulin dependent (HCC) -controlled Lab Results  Component Value Date   HGBA1C 6.1* 02/06/2013  -continue metformin   5. Mental retardation -will be discharged to group home with PT/OT and nsg assistant  6. Physical deconditioning -improved strength and function -continue therapy outpt  7. Constipation -continue senokot s  Patient is being discharged with the following home health services:  PT/OT/NSG  Patient is being discharged with the following durable medical equipment:  NA, has bipap and oxygen  Patient has been advised to f/u with their PCP in 1-2 weeks (apt made for 6/2 with Dr.Griffin) to bring them up to date on their rehab stay.  Social services at facility was responsible for arranging this appointment.  Pt was provided with a 30 day supply of prescriptions for medications and refills must be obtained from their PCP.  For controlled substances, a more limited supply may be provided adequate until PCP appointment only.  Future labs/tests needed:  CBC

## 2015-10-29 DIAGNOSIS — R279 Unspecified lack of coordination: Secondary | ICD-10-CM | POA: Diagnosis not present

## 2015-10-29 DIAGNOSIS — K922 Gastrointestinal hemorrhage, unspecified: Secondary | ICD-10-CM | POA: Diagnosis not present

## 2015-10-29 DIAGNOSIS — R2681 Unsteadiness on feet: Secondary | ICD-10-CM | POA: Diagnosis not present

## 2015-10-29 DIAGNOSIS — R922 Inconclusive mammogram: Secondary | ICD-10-CM | POA: Diagnosis not present

## 2015-10-29 DIAGNOSIS — K648 Other hemorrhoids: Secondary | ICD-10-CM | POA: Diagnosis not present

## 2015-10-29 DIAGNOSIS — M6281 Muscle weakness (generalized): Secondary | ICD-10-CM | POA: Diagnosis not present

## 2016-03-09 ENCOUNTER — Ambulatory Visit: Payer: Self-pay | Admitting: Pulmonary Disease

## 2016-03-10 ENCOUNTER — Encounter (HOSPITAL_COMMUNITY): Payer: Self-pay | Admitting: *Deleted

## 2016-03-10 ENCOUNTER — Observation Stay (HOSPITAL_COMMUNITY)
Admission: EM | Admit: 2016-03-10 | Discharge: 2016-03-13 | Disposition: A | Discharge status: Home / Self Care | Payer: Medicare Other | Attending: Family Medicine | Admitting: Family Medicine

## 2016-03-10 DIAGNOSIS — Z79899 Other long term (current) drug therapy: Secondary | ICD-10-CM | POA: Insufficient documentation

## 2016-03-10 DIAGNOSIS — R531 Weakness: Secondary | ICD-10-CM

## 2016-03-10 DIAGNOSIS — J45909 Unspecified asthma, uncomplicated: Secondary | ICD-10-CM | POA: Insufficient documentation

## 2016-03-10 DIAGNOSIS — E876 Hypokalemia: Secondary | ICD-10-CM | POA: Insufficient documentation

## 2016-03-10 DIAGNOSIS — R930 Abnormal findings on diagnostic imaging of skull and head, not elsewhere classified: Secondary | ICD-10-CM | POA: Diagnosis not present

## 2016-03-10 DIAGNOSIS — Z7984 Long term (current) use of oral hypoglycemic drugs: Secondary | ICD-10-CM | POA: Insufficient documentation

## 2016-03-10 DIAGNOSIS — E871 Hypo-osmolality and hyponatremia: Secondary | ICD-10-CM

## 2016-03-10 DIAGNOSIS — K649 Unspecified hemorrhoids: Secondary | ICD-10-CM | POA: Diagnosis present

## 2016-03-10 DIAGNOSIS — K648 Other hemorrhoids: Secondary | ICD-10-CM | POA: Diagnosis not present

## 2016-03-10 DIAGNOSIS — M6281 Muscle weakness (generalized): Secondary | ICD-10-CM | POA: Diagnosis not present

## 2016-03-10 DIAGNOSIS — D649 Anemia, unspecified: Secondary | ICD-10-CM | POA: Diagnosis not present

## 2016-03-10 DIAGNOSIS — E119 Type 2 diabetes mellitus without complications: Secondary | ICD-10-CM | POA: Diagnosis not present

## 2016-03-10 DIAGNOSIS — I10 Essential (primary) hypertension: Secondary | ICD-10-CM | POA: Diagnosis not present

## 2016-03-10 DIAGNOSIS — K625 Hemorrhage of anus and rectum: Secondary | ICD-10-CM | POA: Diagnosis present

## 2016-03-10 LAB — CBC WITH DIFFERENTIAL/PLATELET
BASOS ABS: 0 10*3/uL (ref 0.0–0.1)
Basophils Relative: 0 %
Eosinophils Absolute: 0 10*3/uL (ref 0.0–0.7)
Eosinophils Relative: 0 %
HEMATOCRIT: 30.6 % — AB (ref 36.0–46.0)
HEMOGLOBIN: 8.2 g/dL — AB (ref 12.0–15.0)
LYMPHS PCT: 62 %
Lymphs Abs: 2.3 10*3/uL (ref 0.7–4.0)
MCH: 19.4 pg — AB (ref 26.0–34.0)
MCHC: 26.8 g/dL — ABNORMAL LOW (ref 30.0–36.0)
MCV: 72.3 fL — AB (ref 78.0–100.0)
MONOS PCT: 21 %
Monocytes Absolute: 0.8 10*3/uL (ref 0.1–1.0)
NEUTROS ABS: 0.6 10*3/uL — AB (ref 1.7–7.7)
NEUTROS PCT: 17 %
Platelets: 237 10*3/uL (ref 150–400)
RBC: 4.23 MIL/uL (ref 3.87–5.11)
RDW: 19 % — ABNORMAL HIGH (ref 11.5–15.5)
WBC: 3.7 10*3/uL — ABNORMAL LOW (ref 4.0–10.5)

## 2016-03-10 LAB — COMPREHENSIVE METABOLIC PANEL
ALT: 11 U/L — AB (ref 14–54)
AST: 20 U/L (ref 15–41)
Albumin: 3.5 g/dL (ref 3.5–5.0)
Alkaline Phosphatase: 48 U/L (ref 38–126)
Anion gap: 10 (ref 5–15)
BILIRUBIN TOTAL: 0.7 mg/dL (ref 0.3–1.2)
BUN: 8 mg/dL (ref 6–20)
CALCIUM: 8.8 mg/dL — AB (ref 8.9–10.3)
CO2: 38 mmol/L — ABNORMAL HIGH (ref 22–32)
CREATININE: 0.59 mg/dL (ref 0.44–1.00)
Chloride: 86 mmol/L — ABNORMAL LOW (ref 101–111)
Glucose, Bld: 157 mg/dL — ABNORMAL HIGH (ref 65–99)
Potassium: 2.9 mmol/L — ABNORMAL LOW (ref 3.5–5.1)
Sodium: 134 mmol/L — ABNORMAL LOW (ref 135–145)
TOTAL PROTEIN: 7.7 g/dL (ref 6.5–8.1)

## 2016-03-10 LAB — URINALYSIS, ROUTINE W REFLEX MICROSCOPIC
BILIRUBIN URINE: NEGATIVE
Glucose, UA: NEGATIVE mg/dL
KETONES UR: NEGATIVE mg/dL
NITRITE: NEGATIVE
PH: 6.5 (ref 5.0–8.0)
Protein, ur: 30 mg/dL — AB
Specific Gravity, Urine: 1.021 (ref 1.005–1.030)

## 2016-03-10 LAB — URINE MICROSCOPIC-ADD ON

## 2016-03-10 LAB — MAGNESIUM: MAGNESIUM: 1.7 mg/dL (ref 1.7–2.4)

## 2016-03-10 MED ORDER — SODIUM CHLORIDE 0.9 % IV BOLUS (SEPSIS)
1000.0000 mL | Freq: Once | INTRAVENOUS | Status: AC
  Administered 2016-03-10: 1000 mL via INTRAVENOUS

## 2016-03-10 MED ORDER — POTASSIUM CHLORIDE 10 MEQ/100ML IV SOLN
10.0000 meq | INTRAVENOUS | Status: AC
  Administered 2016-03-10 – 2016-03-11 (×2): 10 meq via INTRAVENOUS
  Filled 2016-03-10 (×2): qty 100

## 2016-03-10 NOTE — ED Provider Notes (Signed)
[facility]-EMERGENCY DEPT Provider Note   CSN: [id number] Arrival date & time: 03/10/16  1911  By signing my name below, I, Suzan Slick. Elon Spanner, attest that this documentation has been prepared under the direction and in the presence of Gwyneth Sprout, MD.  Electronically Signed: Suzan Slick. Elon Spanner, ED Scribe. 03/10/16. 11:21 PM.    History   Chief Complaint Chief Complaint  Patient presents with  . Hemorrhoids  . Weakness   The history is provided by the patient. No language interpreter was used.    HPI Comments: Shelley [name] is a 50 y.o. female with a PMHx of DM, GI bleed, AVM, hyperlipidemia, HTN, and hemorrhoids who presents to the Emergency Department complaining of intermittent, worsening episodes of rectal bleeding x 2 week. Pt attributes bleeding to a known hemorrhoid. She states she has noted significant blood in the toilet and ongoing bleeding with or without attempts to have a bowel movement. Pt also reports ongoing, worsening generalized weakness along with mild exertional shortness of breath x few days. Family states "she is not able to stand up or walk for long periods of time". No recent fever, chills, or cough. Denies any rectal pain.  Denies any recent changes. No new medications. Pt lives independently and is typically able to ambulate with a walker without issue.  Pt reports more frequent episodes of anxiety over the last several days. Denies any known stressors. Denies any regular alcohol consumption.  PCP: Astrid Divine, MD    Past Medical History:  Diagnosis Date  . Anxiety   . Arthritis   . Asthma   . AVM (arteriovenous malformation) of colon   . Diabetes mellitus   . Diverticulosis   . GERD (gastroesophageal reflux disease)   . GI bleed 09/2015  . Hemorrhoids    Internal and external  . Hiatal hernia   . Hyperlipidemia   . Hypertension   . Mental retardation   . Pneumonia     Patient Active Problem List   Diagnosis Date Noted  . Acute blood loss  anemia   . Bleeding gastrointestinal   . Obesity hypoventilation syndrome (HCC)   . BiPAP (biphasic positive airway pressure) dependence   . Morbid obesity (HCC)   . Diabetes mellitus type 2, noninsulin dependent (HCC)   . Symptomatic anemia 07/30/2015  . Lower GI bleed 07/29/2015  . Diverticulosis 07/29/2015  . History of arteriovenous malformation (AVM) 07/29/2015  . GERD (gastroesophageal reflux disease) 07/29/2015  . Chronic diarrhea 07/29/2015  . Obesity, Class III, BMI 40-49.9 (morbid obesity) (HCC) 03/06/2013  . Hemorrhoids 03/06/2013  . Dependence on supplemental oxygen 03/06/2013  . Anxiety   . Mental retardation   . Iron deficiency anemia due to chronic blood loss 02/06/2013  . Diabetes mellitus (HCC) 02/06/2013  . Physical deconditioning 11/08/2012  . Developmental delay 12/02/2011  . Obstructive sleep apnea 12/02/2011  . Chronic respiratory failure (HCC) 12/02/2011  . ALLERGIC RHINITIS 08/02/2007  . Asthma, persistent controlled 08/02/2007    Past Surgical History:  Procedure Laterality Date  . CHOLECYSTECTOMY     2006  . COLONOSCOPY N/A 02/06/2013   Procedure: COLONOSCOPY;  Surgeon: Theda Belfast, MD;  Location: [facility] ENDOSCOPY;  Service: Endoscopy;  Laterality: N/A;  . COLONOSCOPY N/A 07/31/2015   Procedure: COLONOSCOPY;  Surgeon: Jeani Hawking, MD;  Location: [facility] ENDOSCOPY;  Service: Endoscopy;  Laterality: N/A;  . ESOPHAGOGASTRODUODENOSCOPY N/A 02/06/2013   Procedure: ESOPHAGOGASTRODUODENOSCOPY (EGD);  Surgeon: Theda Belfast, MD;  Location: [facility] ENDOSCOPY;  Service: Endoscopy;  Laterality: N/A;  .  EYE SURGERY    . FLEXIBLE SIGMOIDOSCOPY N/A 09/18/2015   Procedure: FLEXIBLE SIGMOIDOSCOPY;  Surgeon: Jeani HawkingPatrick Hung, MD;  [facility];  Service: Endoscopy;  Laterality: N/A;  . HOT HEMOSTASIS N/A 07/31/2015   Procedure: HOT HEMOSTASIS (ARGON PLASMA COAGULATION/BICAP);  Surgeon: Jeani HawkingPatrick Hung, MD;  [facility];  Service: Endoscopy;  Laterality: N/A;    OB  History    No data available       Home Medications    Prior to Admission medications   Medication Sig Start Date End Date Taking? Authorizing Provider  albuterol (PROVENTIL) (2.5 MG/3ML) 0.083% nebulizer solution Take 3 mLs (2.5 mg total) by nebulization every 6 (six) hours as needed for wheezing or shortness of breath. 06/04/15   Coralyn HellingVineet Sood, MD  budesonide (PULMICORT) 0.25 MG/2ML nebulizer solution Take 0.25 mg by nebulization 2 (two) times daily.    Historical Provider, MD  famotidine (PEPCID) 20 MG tablet Take 1 tablet (20 mg total) by mouth at bedtime. 09/21/15   Albertine GratesFang Xu, MD  fenofibrate 160 MG tablet Take 160 mg by mouth daily.    Historical Provider, MD  FERREX 150 150 MG capsule Take 150 mg by mouth daily. 07/22/15   Historical Provider, MD  loratadine (CLARITIN) 10 MG tablet Take 10 mg by mouth daily as needed. For allergies    Historical Provider, MD  losartan-hydrochlorothiazide (HYZAAR) 50-12.5 MG tablet Take 1 tablet by mouth daily. 09/21/15   Albertine GratesFang Xu, MD  metFORMIN (GLUCOPHAGE) 500 MG tablet Take 1,000 mg by mouth every evening.     Historical Provider, MD  montelukast (SINGULAIR) 10 MG tablet Take 10 mg by mouth at bedtime.    Historical Provider, MD  rosuvastatin (CRESTOR) 10 MG tablet Take 10 mg by mouth daily.    Historical Provider, MD  senna-docusate (SENOKOT-S) 8.6-50 MG tablet Take 2 tablets by mouth at bedtime. 09/21/15   Albertine GratesFang Xu, MD  sertraline (ZOLOFT) 50 MG tablet Take 100 mg by mouth daily.     Historical Provider, MD    Family History Family History  Problem Relation Age of Onset  . COPD Mother     Social History Social History  Substance Use Topics  . Smoking status: Never Smoker  . Smokeless tobacco: Never Used  . Alcohol use No     Allergies   Fish allergy; Pepto-bismol [bismuth subsalicylate]; Cortisone; and Trichophyton   Review of Systems Review of Systems  Constitutional: Negative for chills and fever.  Respiratory: Positive for shortness  of breath.   Gastrointestinal: Positive for anal bleeding. Negative for rectal pain.  Neurological: Positive for weakness.  All other systems reviewed and are negative.    Physical Exam Updated Vital Signs BP 140/84 (BP Location: Right Arm)   Pulse 93   Temp 99 F (37.2 C) (Oral)   Resp 18   Ht 5\' 6"  (1.676 m)   Wt 236 lb (107 kg)   SpO2 98%   BMI 38.09 kg/m   Physical Exam  Constitutional: She is oriented to person, place, and time. She appears well-developed and well-nourished. No distress.  Obese   HENT:  Head: Normocephalic and atraumatic.  Eyes: Conjunctivae and EOM are normal.  Neck: Normal range of motion.  Cardiovascular: Normal rate, regular rhythm and normal heart sounds.   Pulmonary/Chest: Effort normal and breath sounds normal.  Abdominal: Soft. She exhibits no distension. There is no tenderness.  Genitourinary:  Genitourinary Comments: Dried blood at rectal but no external hemorrhoids noted.  Musculoskeletal: Normal range of motion.  Neurological: She is alert and oriented to person, place, and time.  Skin: Skin is warm and dry. There is pallor.  Psychiatric: She has a normal mood and affect. Judgment normal.  Nursing note and vitals reviewed.    ED Treatments / Results   DIAGNOSTIC STUDIES: Oxygen Saturation is 98% on RA, normal by my interpretation.    COORDINATION OF CARE: 11:14 PM- Will order blood work and urinalysis. Discussed treatment plan with pt at bedside and pt agreed to plan.     Labs (all labs ordered are listed, but only abnormal results are displayed) Labs Reviewed  CBC WITH DIFFERENTIAL/PLATELET - Abnormal; Notable for the following:       Result Value   WBC 3.7 (*)    Hemoglobin 8.2 (*)    HCT 30.6 (*)    MCV 72.3 (*)    MCH 19.4 (*)    MCHC 26.8 (*)    RDW 19.0 (*)    Neutro Abs 0.6 (*)    All other components within normal limits  COMPREHENSIVE METABOLIC PANEL - Abnormal; Notable for the following:    Sodium 134 (*)     Potassium 2.9 (*)    Chloride 86 (*)    CO2 38 (*)    Glucose, Bld 157 (*)    Calcium 8.8 (*)    ALT 11 (*)    All other components within normal limits  URINALYSIS, ROUTINE W REFLEX MICROSCOPIC (NOT AT [facility])  POC URINE PREG, ED    EKG  EKG Interpretation None       Radiology No results found.  Procedures Procedures (including critical care time)  Medications Ordered in ED Medications - No data to display   Initial Impression / Assessment and Plan / ED Course  I have reviewed the triage vital signs and the nursing notes.  Pertinent labs & imaging results that were available during my care of the patient were reviewed by me and considered in my medical decision making (see chart for details).  Clinical Course   Patient is a 50 year old female with a past medical history significant for blood loss anemia from internal hemorrhoids and colonic AVM who is presenting today with intermittent bleeding from her hemorrhoids over the last 2 weeks and more recently development of generalized weakness, exertional dyspnea and occasional shaking. She also states she typically has loose stool but is unable to quantify how much. Also difficult to have the patient quantify how much blood as in her stool. She is awake and alert and able to answer all options. She is asymptomatic with lying down. She denies any decreased in by mouth intake. Labs today show hemoglobin of 8.2 which is what she was after transfusion in April.  Unclear if pt's Hb had risen and has now dropped. In April she was admitted for GI bleeding at that time hemoglobin was 5 and she was transfused up to 8. Today patient's potassium is low at 2.9. Magnesium, UA, EKG, chest x-ray pending. Patient given IV potassium.  Mag, UA, EKG and CXR wnl.  Pt not improved after potassium.  Still unable to ambulate.  Will admit for anemia and weakness   Final Clinical Impressions(s) / ED Diagnoses   Final diagnoses:  Weakness  Internal  hemorrhoids  Anemia, unspecified anemia type  Hypokalemia    New Prescriptions New Prescriptions   No medications on file   I personally performed the services described in this documentation, which was scribed in my presence.  The recorded information has  been reviewed and considered.    Gwyneth Sprout, MD 03/11/16 [phone]

## 2016-03-10 NOTE — ED Notes (Signed)
Unable to locate

## 2016-03-10 NOTE — ED Triage Notes (Signed)
Patient presents stating she has been having increased bleeding from a hemorrhoid which has been causing her to feel weak and unsteady

## 2016-03-11 ENCOUNTER — Emergency Department (HOSPITAL_COMMUNITY): Payer: Medicare Other

## 2016-03-11 ENCOUNTER — Encounter (HOSPITAL_COMMUNITY): Payer: Self-pay | Admitting: Internal Medicine

## 2016-03-11 ENCOUNTER — Observation Stay (HOSPITAL_COMMUNITY): Payer: Medicare Other

## 2016-03-11 DIAGNOSIS — K648 Other hemorrhoids: Secondary | ICD-10-CM

## 2016-03-11 DIAGNOSIS — K625 Hemorrhage of anus and rectum: Secondary | ICD-10-CM | POA: Diagnosis not present

## 2016-03-11 DIAGNOSIS — E119 Type 2 diabetes mellitus without complications: Secondary | ICD-10-CM

## 2016-03-11 DIAGNOSIS — E871 Hypo-osmolality and hyponatremia: Secondary | ICD-10-CM | POA: Diagnosis not present

## 2016-03-11 DIAGNOSIS — E876 Hypokalemia: Secondary | ICD-10-CM

## 2016-03-11 LAB — COMPREHENSIVE METABOLIC PANEL
ALK PHOS: 44 U/L (ref 38–126)
ALT: 11 U/L — AB (ref 14–54)
AST: 17 U/L (ref 15–41)
Albumin: 3.1 g/dL — ABNORMAL LOW (ref 3.5–5.0)
Anion gap: 5 (ref 5–15)
BUN: 7 mg/dL (ref 6–20)
CALCIUM: 8.4 mg/dL — AB (ref 8.9–10.3)
CO2: 42 mmol/L — ABNORMAL HIGH (ref 22–32)
CREATININE: 0.49 mg/dL (ref 0.44–1.00)
Chloride: 90 mmol/L — ABNORMAL LOW (ref 101–111)
Glucose, Bld: 158 mg/dL — ABNORMAL HIGH (ref 65–99)
Potassium: 3.1 mmol/L — ABNORMAL LOW (ref 3.5–5.1)
Sodium: 137 mmol/L (ref 135–145)
Total Bilirubin: 0.4 mg/dL (ref 0.3–1.2)
Total Protein: 6.7 g/dL (ref 6.5–8.1)

## 2016-03-11 LAB — CBC
HCT: 28.1 % — ABNORMAL LOW (ref 36.0–46.0)
HCT: 30.5 % — ABNORMAL LOW (ref 36.0–46.0)
HCT: 30.1 % — ABNORMAL LOW (ref 36.0–46.0)
HEMOGLOBIN: 7.4 g/dL — AB (ref 12.0–15.0)
HEMOGLOBIN: 7.7 g/dL — AB (ref 12.0–15.0)
Hemoglobin: 7.9 g/dL — ABNORMAL LOW (ref 12.0–15.0)
MCH: 19.4 pg — AB (ref 26.0–34.0)
MCH: 19.2 pg — ABNORMAL LOW (ref 26.0–34.0)
MCH: 18.9 pg — ABNORMAL LOW (ref 26.0–34.0)
MCHC: 26.3 g/dL — ABNORMAL LOW (ref 30.0–36.0)
MCHC: 25.9 g/dL — ABNORMAL LOW (ref 30.0–36.0)
MCHC: 25.6 g/dL — AB (ref 30.0–36.0)
MCV: 73.6 fL — AB (ref 78.0–100.0)
MCV: 74.2 fL — AB (ref 78.0–100.0)
MCV: 74 fL — ABNORMAL LOW (ref 78.0–100.0)
PLATELETS: 220 10*3/uL (ref 150–400)
PLATELETS: 228 10*3/uL (ref 150–400)
PLATELETS: 226 10*3/uL (ref 150–400)
RBC: 3.82 MIL/uL — AB (ref 3.87–5.11)
RBC: 4.11 MIL/uL (ref 3.87–5.11)
RBC: 4.07 MIL/uL (ref 3.87–5.11)
RDW: 19.1 % — ABNORMAL HIGH (ref 11.5–15.5)
RDW: 19.1 % — AB (ref 11.5–15.5)
RDW: 19 % — ABNORMAL HIGH (ref 11.5–15.5)
WBC: 2.8 10*3/uL — ABNORMAL LOW (ref 4.0–10.5)
WBC: 3.6 10*3/uL — AB (ref 4.0–10.5)
WBC: 3.8 10*3/uL — ABNORMAL LOW (ref 4.0–10.5)

## 2016-03-11 LAB — BASIC METABOLIC PANEL
Anion gap: 6 (ref 5–15)
BUN: 8 mg/dL (ref 6–20)
CHLORIDE: 90 mmol/L — AB (ref 101–111)
CO2: 42 mmol/L — AB (ref 22–32)
CREATININE: 0.53 mg/dL (ref 0.44–1.00)
Calcium: 8.6 mg/dL — ABNORMAL LOW (ref 8.9–10.3)
GFR calc Af Amer: >60 mL/min (ref >60)
GFR calc non Af Amer: >60 mL/min (ref >60)
GLUCOSE: 117 mg/dL — AB (ref 65–99)
POTASSIUM: 3.1 mmol/L — AB (ref 3.5–5.1)
SODIUM: 138 mmol/L (ref 135–145)

## 2016-03-11 LAB — GLUCOSE, CAPILLARY
GLUCOSE-CAPILLARY: 153 mg/dL — AB (ref 65–99)
Glucose-Capillary: 154 mg/dL — ABNORMAL HIGH (ref 65–99)
Glucose-Capillary: 131 mg/dL — ABNORMAL HIGH (ref 65–99)
Glucose-Capillary: 117 mg/dL — ABNORMAL HIGH (ref 65–99)
Glucose-Capillary: 112 mg/dL — ABNORMAL HIGH (ref 65–99)

## 2016-03-11 LAB — TROPONIN I: Troponin I: <0.03 ng/mL (ref <0.03)

## 2016-03-11 LAB — PATHOLOGIST SMEAR REVIEW

## 2016-03-11 LAB — TSH: TSH: 0.55 u[IU]/mL (ref 0.350–4.500)

## 2016-03-11 LAB — PREGNANCY, URINE: PREG TEST UR: NEGATIVE

## 2016-03-11 LAB — PREPARE RBC (CROSSMATCH)

## 2016-03-11 MED ORDER — ATORVASTATIN CALCIUM 10 MG PO TABS
10.0000 mg | ORAL_TABLET | Freq: Every day | ORAL | Status: DC
  Administered 2016-03-11 – 2016-03-13 (×3): 10 mg via ORAL
  Filled 2016-03-11 (×3): qty 1

## 2016-03-11 MED ORDER — LOSARTAN POTASSIUM-HCTZ 50-12.5 MG PO TABS: 1.0000 | ORAL_TABLET | Freq: Every day | ORAL | Status: DC

## 2016-03-11 MED ORDER — LOSARTAN POTASSIUM 50 MG PO TABS
50.0000 mg | ORAL_TABLET | Freq: Every day | ORAL | Status: DC
  Administered 2016-03-11 – 2016-03-13 (×3): 50 mg via ORAL
  Filled 2016-03-11 (×3): qty 1

## 2016-03-11 MED ORDER — MONTELUKAST SODIUM 10 MG PO TABS
10.0000 mg | ORAL_TABLET | Freq: Every day | ORAL | Status: DC
  Administered 2016-03-11 – 2016-03-12 (×2): 10 mg via ORAL
  Filled 2016-03-11 (×2): qty 1

## 2016-03-11 MED ORDER — PANTOPRAZOLE SODIUM 40 MG PO TBEC
40.0000 mg | DELAYED_RELEASE_TABLET | Freq: Every day | ORAL | Status: DC
  Administered 2016-03-11 – 2016-03-13 (×3): 40 mg via ORAL
  Filled 2016-03-11 (×3): qty 1

## 2016-03-11 MED ORDER — SERTRALINE HCL 100 MG PO TABS
100.0000 mg | ORAL_TABLET | Freq: Every day | ORAL | Status: DC
  Administered 2016-03-11 – 2016-03-13 (×3): 100 mg via ORAL
  Filled 2016-03-11 (×3): qty 1

## 2016-03-11 MED ORDER — ALBUTEROL SULFATE (2.5 MG/3ML) 0.083% IN NEBU: 2.5000 mg | INHALATION_SOLUTION | Freq: Four times a day (QID) | RESPIRATORY_TRACT | Status: DC | PRN

## 2016-03-11 MED ORDER — SODIUM CHLORIDE 0.9 % IV SOLN
INTRAVENOUS | Status: AC
  Administered 2016-03-11: 06:00:00 via INTRAVENOUS

## 2016-03-11 MED ORDER — SODIUM CHLORIDE 0.9 % IV SOLN: Freq: Once | INTRAVENOUS | Status: DC

## 2016-03-11 MED ORDER — POLYSACCHARIDE IRON COMPLEX 150 MG PO CAPS
150.0000 mg | ORAL_CAPSULE | Freq: Every day | ORAL | Status: DC
  Administered 2016-03-11 – 2016-03-13 (×3): 150 mg via ORAL
  Filled 2016-03-11 (×3): qty 1

## 2016-03-11 MED ORDER — ACETAMINOPHEN 650 MG RE SUPP: 650.0000 mg | Freq: Four times a day (QID) | RECTAL | Status: DC | PRN

## 2016-03-11 MED ORDER — HYDROCHLOROTHIAZIDE 12.5 MG PO CAPS
12.5000 mg | ORAL_CAPSULE | Freq: Every day | ORAL | Status: DC
  Administered 2016-03-11 – 2016-03-13 (×3): 12.5 mg via ORAL
  Filled 2016-03-11 (×3): qty 1

## 2016-03-11 MED ORDER — BUDESONIDE 0.25 MG/2ML IN SUSP
0.2500 mg | Freq: Every day | RESPIRATORY_TRACT | Status: DC
  Administered 2016-03-11 – 2016-03-13 (×3): 0.25 mg via RESPIRATORY_TRACT
  Filled 2016-03-11 (×4): qty 2

## 2016-03-11 MED ORDER — POTASSIUM CHLORIDE 10 MEQ/100ML IV SOLN
10.0000 meq | Freq: Once | INTRAVENOUS | Status: AC
  Administered 2016-03-11: 10 meq via INTRAVENOUS
  Filled 2016-03-11: qty 100

## 2016-03-11 MED ORDER — INSULIN ASPART 100 UNIT/ML ~~LOC~~ SOLN
0.0000 [IU] | Freq: Three times a day (TID) | SUBCUTANEOUS | Status: DC
  Administered 2016-03-11: 2 [IU] via SUBCUTANEOUS
  Administered 2016-03-13: 1 [IU] via SUBCUTANEOUS
  Administered 2016-03-13: 2 [IU] via SUBCUTANEOUS

## 2016-03-11 MED ORDER — SODIUM CHLORIDE 0.9% FLUSH
3.0000 mL | Freq: Two times a day (BID) | INTRAVENOUS | Status: DC
  Administered 2016-03-11 – 2016-03-13 (×4): 3 mL via INTRAVENOUS

## 2016-03-11 MED ORDER — ACETAMINOPHEN 325 MG PO TABS: 650.0000 mg | ORAL_TABLET | Freq: Four times a day (QID) | ORAL | Status: DC | PRN

## 2016-03-11 MED ORDER — POTASSIUM CHLORIDE 10 MEQ/100ML IV SOLN
10.0000 meq | INTRAVENOUS | Status: AC
  Administered 2016-03-11 (×2): 10 meq via INTRAVENOUS
  Filled 2016-03-11 (×2): qty 100

## 2016-03-11 MED ORDER — LORATADINE 10 MG PO TABS
10.0000 mg | ORAL_TABLET | Freq: Every day | ORAL | Status: DC | PRN
Start: 2016-03-11 — End: 2016-03-13

## 2016-03-11 MED ORDER — METFORMIN HCL 500 MG PO TABS: 1000.0000 mg | ORAL_TABLET | Freq: Every day | ORAL | Status: DC

## 2016-03-11 MED ORDER — INSULIN ASPART 100 UNIT/ML ~~LOC~~ SOLN: 0.0000 [IU] | Freq: Every day | SUBCUTANEOUS | Status: DC

## 2016-03-11 NOTE — H&P (Signed)
TRH H&P   Patient Demographics:    Shelley [name], is a 50 y.o. female  MRN: [id number]   DOB - [birth year]  Admit Date - 03/10/2016  Outpatient Primary MD for the patient is Astrid Divine, MD  Referring MD/NP/PA: Gwyneth Sprout  Outpatient Specialists: Jeani Hawking  Patient coming from:  home  Chief Complaint  Patient presents with  . Hemorrhoids  . Weakness      HPI:    Shelley [name]  is a 50 y.o. 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  Pt notes has been bleeding from her rectum x1 week.  Intermittent.  Pt denies fever, chills, n/v, diarrhea, constipation. Pt was brought to ED for evaluation  In ED,  Pt noted to be significantly anemic. Hgb 8.1.  Pt was also noted to be hypokalemic with K=2.9 ,  As well as hyponatremic Na=134.  Pt will be admitted for rectal bleeding, and generalized weakness likely due to combination of anemia, hypokalemia.      Review of systems:    In addition to the HPI above, No Fever-chills, No Headache, No changes with Vision or hearing, No problems swallowing food or Liquids, No Chest pain, Cough or Shortness of Breath, No Abdominal pain, No Nausea or Vommitting,  No Blood in  Urine, No dysuria, No new skin rashes or bruises, No new joints pains-aches,  No new weakness, tingling, numbness in any extremity, No recent weight gain or loss, No polyuria, polydypsia or polyphagia, No significant Mental Stressors.  A full 10 point Review of Systems was done, except as stated above, all other Review of Systems were negative.   With Past History of the following :    Past Medical History:  Diagnosis Date  . Anxiety   . Arthritis   . Asthma   . AVM (arteriovenous malformation) of colon   . Diabetes mellitus   . Diverticulosis    . GERD (gastroesophageal reflux disease)   . GI bleed 09/2015  . Hemorrhoids    Internal and external  . Hiatal hernia   . Hyperlipidemia   . Hypertension   . Mental retardation   . Pneumonia       Past Surgical History:  Procedure Laterality Date  . CHOLECYSTECTOMY     2006  . COLONOSCOPY N/A 02/06/2013   Procedure: COLONOSCOPY;  Surgeon: Theda Belfast, MD;  Location: [facility] ENDOSCOPY;  Service: Endoscopy;  Laterality: N/A;  . COLONOSCOPY N/A 07/31/2015   Procedure: COLONOSCOPY;  Surgeon: Jeani Hawking, MD;  Location: [facility] ENDOSCOPY;  Service: Endoscopy;  Laterality: N/A;  . ESOPHAGOGASTRODUODENOSCOPY N/A 02/06/2013   Procedure: ESOPHAGOGASTRODUODENOSCOPY (EGD);  Surgeon: Theda Belfast, MD;  Location: [facility] ENDOSCOPY;  Service: Endoscopy;  Laterality: N/A;  . EYE SURGERY    . FLEXIBLE SIGMOIDOSCOPY N/A 09/18/2015   Procedure: FLEXIBLE SIGMOIDOSCOPY;  Surgeon: Luisa Hart  Elnoria HowardHung, MD;  [facility];  Service: Endoscopy;  Laterality: N/A;  . HOT HEMOSTASIS N/A 07/31/2015   Procedure: HOT HEMOSTASIS (ARGON PLASMA COAGULATION/BICAP);  Surgeon: Jeani HawkingPatrick Hung, MD;  [facility];  Service: Endoscopy;  Laterality: N/A;      Social History:     Social History  Substance Use Topics  . Smoking status: Never Smoker  . Smokeless tobacco: Never Used  . Alcohol use No     Lives - at home  Mobility - ambulates by self w walker    Family History :     Family History  Problem Relation Age of Onset  . COPD Mother   . Bone cancer Father       Home Medications:   Prior to Admission medications   Medication Sig Start Date End Date Taking? Authorizing Provider  albuterol (PROVENTIL) (2.5 MG/3ML) 0.083% nebulizer solution Take 3 mLs (2.5 mg total) by nebulization every 6 (six) hours as needed for wheezing or shortness of breath. 06/04/15  Yes Coralyn HellingVineet Sood, MD  atorvastatin (LIPITOR) 10 MG tablet Take 10 mg by mouth daily.   Yes Historical Provider, MD  budesonide (PULMICORT) 0.25  MG/2ML nebulizer solution Take 0.25 mg by nebulization every morning.    Yes Historical Provider, MD  famotidine (PEPCID) 20 MG tablet Take 1 tablet (20 mg total) by mouth at bedtime. 09/21/15  Yes Albertine GratesFang Xu, MD  FERREX 150 150 MG capsule Take 150 mg by mouth daily. 07/22/15  Yes Historical Provider, MD  loratadine (CLARITIN) 10 MG tablet Take 10 mg by mouth daily as needed for allergies.    Yes Historical Provider, MD  losartan-hydrochlorothiazide (HYZAAR) 50-12.5 MG tablet Take 1 tablet by mouth daily. 09/21/15  Yes Albertine GratesFang Xu, MD  metFORMIN (GLUCOPHAGE) 500 MG tablet Take 1,000 mg by mouth every evening.    Yes Historical Provider, MD  montelukast (SINGULAIR) 10 MG tablet Take 10 mg by mouth at bedtime.   Yes Historical Provider, MD  omeprazole (PRILOSEC) 20 MG capsule Take 40 mg by mouth daily.   Yes Historical Provider, MD  sertraline (ZOLOFT) 50 MG tablet Take 100 mg by mouth daily.    Yes Historical Provider, MD     Allergies:     Allergies  Allergen Reactions  . Fish Allergy Swelling  . Pepto-Bismol [Bismuth Subsalicylate] Nausea And Vomiting  . Cortisone Itching and Rash    Injections and cream only.  PO ok.  . Trichophyton Itching    Also sneezing accompanying as well     Physical Exam:   Vitals  Blood pressure 127/60, pulse 97, temperature 99.1 F (37.3 C), temperature source Oral, resp. rate 19, height 5\' 6"  (1.676 m), weight 107 kg (236 lb), SpO2 100 %.   1. General  lying in bed in NAD,    2. Normal affect and insight, Not Suicidal or Homicidal, Awake Alert, Oriented X 3.  3. No F.N deficits, ALL C.Nerves Intact, Strength 5/5 all 4 extremities, Sensation intact all 4 extremities, Plantars down going.  4. Ears and Eyes appear Normal, Conjunctivae clear, and pale PERRLA. Moist Oral Mucosa.  5. Supple Neck, No JVD, No cervical lymphadenopathy appriciated, No Carotid Bruits.  6. Symmetrical Chest wall movement, Good air movement bilaterally, CTAB.  7. RRR, No Gallops,  Rubs or Murmurs, No Parasternal Heave.  8. Positive Bowel Sounds, Abdomen Soft, No tenderness, No organomegaly appriciated,No rebound -guarding or rigidity.  9.  No Cyanosis, Normal Skin Turgor, No Skin Rash or Bruise.  10. Good muscle  tone,  joints appear normal , no effusions, Normal ROM.  11. No Palpable Lymph Nodes in Neck or Axillae    Data Review:    CBC  Recent Labs Lab 03/10/16 1941  WBC 3.7*  HGB 8.2*  HCT 30.6*  PLT 237  MCV 72.3*  MCH 19.4*  MCHC 26.8*  RDW 19.0*  LYMPHSABS 2.3  MONOABS 0.8  EOSABS 0.0  BASOSABS 0.0   ------------------------------------------------------------------------------------------------------------------  Chemistries   Recent Labs Lab 03/10/16 1941 03/10/16 2338  NA 134*  --   K 2.9*  --   CL 86*  --   CO2 38*  --   GLUCOSE 157*  --   BUN 8  --   CREATININE 0.59  --   CALCIUM 8.8*  --   MG  --  1.7  AST 20  --   ALT 11*  --   ALKPHOS 48  --   BILITOT 0.7  --    ------------------------------------------------------------------------------------------------------------------ estimated creatinine clearance is 104.1 mL/min (by C-G formula based on SCr of 0.59 mg/dL). ------------------------------------------------------------------------------------------------------------------ No results for input(s): TSH, T4TOTAL, T3FREE, THYROIDAB in the last 72 hours.  Invalid input(s): FREET3  Coagulation profile No results for input(s): INR, PROTIME in the last 168 hours. ------------------------------------------------------------------------------------------------------------------- No results for input(s): DDIMER in the last 72 hours. -------------------------------------------------------------------------------------------------------------------  Cardiac Enzymes No results for input(s): CKMB, TROPONINI, MYOGLOBIN in the last 168 hours.  Invalid input(s):  CK ------------------------------------------------------------------------------------------------------------------ No results found for: BNP   ---------------------------------------------------------------------------------------------------------------  Urinalysis    Component Value Date/Time   COLORURINE AMBER (A) 03/10/2016 2320   APPEARANCEUR CLOUDY (A) 03/10/2016 2320   LABSPEC 1.021 03/10/2016 2320   PHURINE 6.5 03/10/2016 2320   GLUCOSEU NEGATIVE 03/10/2016 2320   HGBUR LARGE (A) 03/10/2016 2320   BILIRUBINUR NEGATIVE 03/10/2016 2320   KETONESUR NEGATIVE 03/10/2016 2320   PROTEINUR 30 (A) 03/10/2016 2320   NITRITE NEGATIVE 03/10/2016 2320   LEUKOCYTESUR TRACE (A) 03/10/2016 2320    ----------------------------------------------------------------------------------------------------------------   Imaging Results:    Dg Chest 2 View  Result Date: 03/11/2016 CLINICAL DATA:  50 year old female with shortness of breath. EXAM: CHEST  2 VIEW COMPARISON:  Chest radiograph dated 09/17/2015 FINDINGS: Two views of the chest demonstrate shallow inspiration. No focal consolidation, pleural effusion, or pneumothorax. Stable top-normal cardiac size. No acute osseous pathology. IMPRESSION: No active cardiopulmonary disease. Electronically Signed   By: Elgie Collard M.D.   On: 03/11/2016 00:50      Assessment & Plan:    Active Problems:   Diabetes mellitus type 2, noninsulin dependent (HCC)   Rectal bleeding   Hyponatremia    1. Rectal bleeding ddx hemorrhoids, avm, diverticulosis NPO except for medications Please call GI in the am to evaluate Type and screen Repeat cbc this am If more anemic may need transfusion  2.  Hypokalemia repleted in ED, Check cmp in am  3. Hyponatremia Hydrate with ns iv Check cmp in am  4. Dm2 fsbs ac and qhs, iss  5. Gerd Cont protonix  6. Bradycardia Check trop I q6h x3 Check tsh  DVT Prophylaxis  SCDs   AM Labs Ordered,  also please review Full Orders  Family Communication: Admission, patients condition and plan of care including tests being ordered have been discussed with the patient  who indicate understanding and agree with the plan and Code Status.  Code Status  FULL CODE  Likely DC to  home  Condition GUARDED    Consults called: , please call GI in am  Admission  status: inpatient  Time spent in minutes : 45 min   Pearson Grippe M.D on 03/11/2016 at 4:04 AM  Between 7am to 7pm - Pager - [phone]. After 7pm go to [email or web address] - password [facility]  [facility] - Office  [phone]

## 2016-03-11 NOTE — Care Management Note (Signed)
Case Management Note  Patient Details  Name: Vickii Pennaenny A Radel MRN: [id number] Date of Birth: 06/08/1966  Subjective/Objective:     Pt in with rectal bleeding. She is from home alone and states she does not have any family in the area. Pt states she has a private aide that comes and assists her 3 times a week.               Action/Plan: Awaiting PT recommendations. CM following for d/c disposition.   Expected Discharge Date:                  Expected Discharge Plan:  Home/Self Care  In-House Referral:     Discharge planning Services     Post Acute Care Choice:    Choice offered to:     DME Arranged:    DME Agency:     HH Arranged:    HH Agency:     Status of Service:  In process, will continue to follow  If discussed at Long Length of Stay Meetings, dates discussed:    Additional Comments:  Kermit BaloKelli F Juel Ripley, RN 03/11/2016, 2:31 PM

## 2016-03-11 NOTE — Evaluation (Signed)
Physical Therapy Evaluation Patient Details Name: Shelley [name] MRN: [id number] DOB: 03/06/1966 Today's Date: 03/11/2016   History of Present Illness  Patient is a 50 yo female admitted 03/10/16 with rectal bleeding, anemia, hypokalemia, and weakness.    PMH:  mental retardation, anxiety, arthritis, DM, GIB, HTN  Clinical Impression  Patient presents with problems listed below.  Will benefit from acute PT to maximize functional mobility prior to discharge.  Patient with difficulty remaining in stance today due to weakness.  Recommend f/u HHPT at ALF at discharge (if ALF can provide assistance needed).    Follow Up Recommendations Home health PT;Supervision for mobility/OOB    Equipment Recommendations  Wheelchair (measurements PT);Wheelchair cushion (measurements PT)    Recommendations for Other Services       Precautions / Restrictions Precautions Precautions: Fall Precaution Comments: Falls at home.  Knees buckling Restrictions Weight Bearing Restrictions: No      Mobility  Bed Mobility Overal bed mobility: Modified Independent             General bed mobility comments: Increased time.  Transfers Overall transfer level: Needs assistance Equipment used: Rolling walker (2 wheeled) Transfers: Sit to/from [employer] Sit to Stand: Mod assist Stand pivot transfers: Mod assist;+2 safety/equipment       General transfer comment: Verbal cues for hand placement.  Patient required mod assist to power up to standing - using rocking motion to assist.  Patient stood x 10-15 seconds and abruptly returned to sitting, stating her legs are too weak.  Performed x3 with similar result.  Required mod assist to transfer to recliner with hand-hold assist.  Ambulation/Gait             General Gait Details: Unable - LE's buckling  Stairs            Wheelchair Mobility    Modified Rankin (Stroke Patients Only)       Balance Overall balance assessment:  Needs assistance;History of Falls         Standing balance support: Bilateral upper extremity supported Standing balance-Leahy Scale: Poor                               Pertinent Vitals/Pain Pain Assessment: No/denies pain    Home Living Family/patient expects to be discharged to:: Assisted living (patient at [facility])   Available Help at Discharge: Personal care attendant (2-3 hours in am to assist with ADL's)           Home Equipment: Walker - 4 wheels;Shower seat;Grab bars - toilet;Grab bars - tub/shower      Prior Function Level of Independence: Needs assistance   Gait / Transfers Assistance Needed: Ambulates with RW.  ADL's / Homemaking Assistance Needed: Patient reports aide [name]) assists her with bathing, dressing in am.  Facility provides meals in dining room.        Hand Dominance   Dominant Hand: Right    Extremity/Trunk Assessment   Upper Extremity Assessment: Generalized weakness           Lower Extremity Assessment: Generalized weakness         Communication   Communication: No difficulties  Cognition Arousal/Alertness: Awake/alert Behavior During Therapy: Flat affect;Anxious;Impulsive Overall Cognitive Status: History of cognitive impairments - at baseline                      General Comments General comments (skin integrity, edema, etc.): Uses  O2 at all times per patient    Exercises     Assessment/Plan    PT Assessment Patient needs continued PT services  PT Problem List Decreased strength;Decreased activity tolerance;Decreased balance;Decreased mobility;Decreased cognition;Decreased knowledge of use of DME;Decreased safety awareness;Cardiopulmonary status limiting activity          PT Treatment Interventions DME instruction;Gait training;Functional mobility training;Therapeutic activities;Therapeutic exercise;Balance training;Patient/family education    PT Goals (Current goals can be found in  the Care Plan section)  Acute Rehab PT Goals Patient Stated Goal: To walk PT Goal Formulation: With patient Time For Goal Achievement: 03/18/16 Potential to Achieve Goals: Good    Frequency Min 3X/week   Barriers to discharge        Co-evaluation               End of Session Equipment Utilized During Treatment: Gait belt;Oxygen Activity Tolerance: Patient limited by fatigue Patient left: in chair;with call bell/phone within reach;with chair alarm set Nurse Communication: Mobility status (In chair with alarm)    Functional Assessment Tool Used: Clinical judgement Functional Limitation: Mobility: Walking and moving around Mobility: Walking and Moving Around Current Status ([id number](G8978): At least 40 percent but less than 60 percent impaired, limited or restricted Mobility: Walking and Moving Around Goal Status [phone](G8979): At least 1 percent but less than 20 percent impaired, limited or restricted    Time: [id number] PT Time Calculation (min) (ACUTE ONLY): 34 min   Charges:   PT Evaluation $PT Eval Moderate Complexity: 1 Procedure PT Treatments $Therapeutic Activity: 8-22 mins   PT G Codes:   PT G-Codes **NOT FOR INPATIENT CLASS** Functional Assessment Tool Used: Clinical judgement Functional Limitation: Mobility: Walking and moving around Mobility: Walking and Moving Around Current Status ([id number](G8978): At least 40 percent but less than 60 percent impaired, limited or restricted Mobility: Walking and Moving Around Goal Status [phone](G8979): At least 1 percent but less than 20 percent impaired, limited or restricted    Vena AustriaDavis, Shelley Pelle H 03/11/2016, 7:02 PM Durenda HurtSusan H. Renaldo [name], PT, [facility] Acute Rehab Services Pager [phone]

## 2016-03-11 NOTE — Progress Notes (Signed)
PROGRESS NOTE    Vickii Pennaenny A Nunn  JYN:[id number] DOB: 01/04/1966 DOA: 03/10/2016 PCP: [name],[name], MD   Brief Narrative:   50 y.o. 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  Pt notes has been bleeding from her rectum x1 week.    Assessment & Plan:   Active Problems:    Rectal bleeding - Consulted [facility] - monitor hgb level q 8 hours - transfusing 1 unit of blood  Diabetes mellitus type 2, noninsulin dependent (HCC) - SSI - discontinuing metformin    Hyponatremia - resolved on reassessment.   Hypokalemia - Will administer potassium replacement - place order to reassess next am.   DVT prophylaxis: SCD's Code Status: Full Family Communication: d/c patient directly Disposition Plan: Pending further evaluation by GI   Consultants:   GI   Procedures: transfusion PRBC   Antimicrobials: None  Subjective: Pt has no new complaints. No acute issues overnight.   Objective: Vitals:   03/11/16 0400 03/11/16 0510 03/11/16 0915 03/11/16 0941  BP: 127/60 123/65 132/74   Pulse: 97 93 95   Resp:  16 18   Temp:  98.9 F (37.2 C) 99.5 F (37.5 C)   TempSrc:  Oral Oral   SpO2: 100% 94% 96% 95%  Weight:  107.5 kg (237 lb 1.6 oz)    Height:        Intake/Output Summary (Last 24 hours) at 03/11/16 1432 Last data filed at 03/11/16 0900  Gross per 24 hour  Intake                0 ml  Output                0 ml  Net                0 ml   Filed Weights   03/10/16 1922 03/11/16 0510  Weight: 107 kg (236 lb) 107.5 kg (237 lb 1.6 oz)    Examination:  General exam: Appears calm and comfortable  Respiratory system: Clear to auscultation. Respiratory effort normal. Cardiovascular system: S1 & S2 heard, RRR. No JVD, murmurs, rubs, gallops or clicks. No pedal edema. Gastrointestinal system: Abdomen is nondistended, soft and nontender. Obese hypoactive bowel  sounds Central nervous system: Alert and oriented. No focal neurological deficits. Extremities: Symmetric 5 x 5 power. Skin: No rashes, lesions or ulcers Psychiatry: Judgement and insight appear normal. Mood & affect appropriate.    Data Reviewed: I have personally reviewed following labs and imaging studies  CBC:  Recent Labs Lab 03/10/16 1941 03/11/16 0657  WBC 3.7* 2.8*  NEUTROABS 0.6*  --   HGB 8.2* 7.4*  HCT 30.6* 28.1*  MCV 72.3* 73.6*  PLT 237 220   Basic Metabolic Panel:  Recent Labs Lab 03/10/16 1941 03/10/16 2338 03/11/16 0657  NA 134*  --  137  K 2.9*  --  3.1*  CL 86*  --  90*  CO2 38*  --  42*  GLUCOSE 157*  --  158*  BUN 8  --  7  CREATININE 0.59  --  0.49  CALCIUM 8.8*  --  8.4*  MG  --  1.7  --    GFR: Estimated Creatinine Clearance: 104.4 mL/min (by C-G formula based on SCr of 0.49 mg/dL). Liver Function Tests:  Recent Labs Lab 03/10/16 1941 03/11/16 0657  AST 20 17  ALT 11* 11*  ALKPHOS 48 44  BILITOT 0.7 0.4  PROT 7.7 6.7  ALBUMIN 3.5 3.1*   No results for input(s): LIPASE, AMYLASE in the last 168 hours. No results for input(s): AMMONIA in the last 168 hours. Coagulation Profile: No results for input(s): INR, PROTIME in the last 168 hours. Cardiac Enzymes:  Recent Labs Lab 03/11/16 0657  TROPONINI <0.03   BNP (last 3 results) No results for input(s): PROBNP in the last 8760 hours. HbA1C: No results for input(s): HGBA1C in the last 72 hours. CBG:  Recent Labs Lab 03/11/16 0525 03/11/16 0720 03/11/16 1124  GLUCAP 154* 153* 131*   Lipid Profile: No results for input(s): CHOL, HDL, LDLCALC, TRIG, CHOLHDL, LDLDIRECT in the last 72 hours. Thyroid Function Tests:  Recent Labs  03/11/16 0657  TSH 0.550   Anemia Panel: No results for input(s): VITAMINB12, FOLATE, FERRITIN, TIBC, IRON, RETICCTPCT in the last 72 hours. Sepsis Labs: No results for input(s): PROCALCITON, LATICACIDVEN in the last 168 hours.  No results  found for this or any previous visit (from the past 240 hour(s)).       Radiology Studies: Dg Chest 2 View  Result Date: 03/11/2016 CLINICAL DATA:  50 year old female with shortness of breath. EXAM: CHEST  2 VIEW COMPARISON:  Chest radiograph dated 09/17/2015 FINDINGS: Two views of the chest demonstrate shallow inspiration. No focal consolidation, pleural effusion, or pneumothorax. Stable top-normal cardiac size. No acute osseous pathology. IMPRESSION: No active cardiopulmonary disease. Electronically Signed   By: Elgie Collard M.D.   On: 03/11/2016 00:50   Ct Head Wo Contrast  Result Date: 03/11/2016 CLINICAL DATA:  50 year old female with bilateral leg weakness. EXAM: CT HEAD WITHOUT CONTRAST TECHNIQUE: Contiguous axial images were obtained from the base of the skull through the vertex without intravenous contrast. COMPARISON:  Head CT dated 09/17/2015 FINDINGS: Brain: The ventricles and sulci are appropriate size for patient's age. Minimal periventricular and deep white matter chronic microvascular ischemic changes noted. There is no acute intracranial hemorrhage. No mass effect or midline shift noted. Vascular: No hyperdense vessel or unexpected calcification. Skull: Normal. Negative for fracture or focal lesion. Sinuses/Orbits: No acute finding. Other: None IMPRESSION: No acute intracranial pathology. Electronically Signed   By: Elgie Collard M.D.   On: 03/11/2016 04:59        Scheduled Meds: . sodium chloride   Intravenous Once  . atorvastatin  10 mg Oral Daily  . budesonide  0.25 mg Nebulization Daily  . hydrochlorothiazide  12.5 mg Oral Daily  . insulin aspart  0-5 Units Subcutaneous QHS  . insulin aspart  0-9 Units Subcutaneous TID WC  . iron polysaccharides  150 mg Oral Daily  . losartan  50 mg Oral Daily  . metFORMIN  1,000 mg Oral Q supper  . montelukast  10 mg Oral QHS  . pantoprazole  40 mg Oral Daily  . sertraline  100 mg Oral Daily  . sodium chloride flush  3 mL  Intravenous Q12H   Continuous Infusions: . sodium chloride 50 mL/hr at 03/11/16 0545     LOS: 0 days   Time spent: > 35 minutes  Penny Pia, MD [facility] Pager [phone]  If 7PM-7AM, please contact night-coverage [email or web address] Password [facility] 03/11/2016, 2:32 PM

## 2016-03-11 NOTE — Care Management Obs Status (Signed)
MEDICARE OBSERVATION STATUS NOTIFICATION   Patient Details  Name: Shelley [name] MRN: [id number] Date of Birth: 02/24/1966   Medicare Observation Status Notification Given:  Yes    Kermit BaloKelli F Sophiarose Eades, RN 03/11/2016, 2:20 PM

## 2016-03-11 NOTE — Progress Notes (Signed)
New Admission Note: [facility]  Arrival Method: via stretcher Mental Orientation: Alert and oriented Telemetry: placed on box 6E25 Assessment: Completed Skin: Intact IV: R aC Pain: Denies Tubes: None Safety Measures: Safety Fall Prevention Plan has been discussed  Admission: To be completed [place] Orientation: Patient has been oriented to the room, unit and staff.  Family: None at bedside at this time  Orders to be reviewed and implemented. Will continue to monitor the patient. Call light has been placed within reach and bed alarm has been activated.   Burley SaverKami Othel Hoogendoorn, BSN, RN-BC Phone: [phone]

## 2016-03-11 NOTE — ED Notes (Signed)
Attempted to call report to [phone] 

## 2016-03-11 NOTE — ED Notes (Signed)
Pt's mother [name](Sally) can be reached at [phone]

## 2016-03-11 NOTE — Consult Note (Signed)
Reason for Consult: Hematochezia Referring Physician: [facility]  Vickii Penna HPI: This is a 50 year old female with a PMH of HTN, cecal AVM, left sided diverticula, asthama, severe OSA, morbid obesity, and mental retardation admitted for complaints of generalized weakness.  At the time of admission her HGB was noted to be 8.2 g/dL and with hydration her HGB dropped down to 7.4 g/dL.  She reports intermittent hematochezia for the past week.  Per her report, the bleeding is minimal, however, during one of the hematochezia events, per her report, her caretaker [name] reported a significant amount of blood.  Shelley [name] only reports hematochezia with bowel movements and she denies any issues with abdominal pain, nausea, vomiting, diarrhea, constipation, or melena.  She was evaluated on 02/06/2013 and 07/31/2015 with colonoscopies.  There was evidence of a large nonbleeding cecal AVM that was ablated with APC.  The follow up colonoscopy for complaints of hematochezia demonstrated a faint possibility of a residual cecal AVM, but this area was ablated.  On 09/18/2015 the patient underwent an unprepped and unsedated FFS as she had severe respiratory issues and precluded the ability to undergo the prep.  The FFS was revealing in that there was no evidence of blood.  My prior recommendations were to focus on treatment for her hemorrhoids and this was attempted by Surgery in the past, but she was not able to tolerate the hemorrhoidal banding as an outpatient.  Her current HGB is within the range of her baseline values and she has ranged from 7-8 g/dL for the past 3 years.  Past Medical History:  Diagnosis Date  . Anxiety   . Arthritis   . Asthma   . AVM (arteriovenous malformation) of colon   . Diabetes mellitus   . Diverticulosis   . GERD (gastroesophageal reflux disease)   . GI bleed 09/2015  . Hemorrhoids    Internal and external  . Hiatal hernia   . Hyperlipidemia   . Hypertension   . Mental  retardation   . Pneumonia     Past Surgical History:  Procedure Laterality Date  . CHOLECYSTECTOMY     2006  . COLONOSCOPY N/A 02/06/2013   Procedure: COLONOSCOPY;  Surgeon: Theda Belfast, MD;  Location: [facility] ENDOSCOPY;  Service: Endoscopy;  Laterality: N/A;  . COLONOSCOPY N/A 07/31/2015   Procedure: COLONOSCOPY;  Surgeon: Jeani Hawking, MD;  Location: [facility] ENDOSCOPY;  Service: Endoscopy;  Laterality: N/A;  . ESOPHAGOGASTRODUODENOSCOPY N/A 02/06/2013   Procedure: ESOPHAGOGASTRODUODENOSCOPY (EGD);  Surgeon: Theda Belfast, MD;  Location: [facility] ENDOSCOPY;  Service: Endoscopy;  Laterality: N/A;  . EYE SURGERY    . FLEXIBLE SIGMOIDOSCOPY N/A 09/18/2015   Procedure: FLEXIBLE SIGMOIDOSCOPY;  Surgeon: Jeani Hawking, MD;  Location: [facility] ENDOSCOPY;  Service: Endoscopy;  Laterality: N/A;  . HOT HEMOSTASIS N/A 07/31/2015   Procedure: HOT HEMOSTASIS (ARGON PLASMA COAGULATION/BICAP);  Surgeon: Jeani Hawking, MD;  Location: [facility] ENDOSCOPY;  Service: Endoscopy;  Laterality: N/A;    Family History  Problem Relation Age of Onset  . COPD Mother   . Bone cancer Father     Social History:  reports that she has never smoked. She has never used smokeless tobacco. She reports that she does not drink alcohol or use drugs.  Allergies:  Allergies  Allergen Reactions  . Fish Allergy Swelling  . Pepto-Bismol [Bismuth Subsalicylate] Nausea And Vomiting  . Cortisone Itching and Rash    Injections and cream only.  PO ok.  . Trichophyton Itching    Also  sneezing accompanying as well    Medications:  Scheduled: . sodium chloride   Intravenous Once  . atorvastatin  10 mg Oral Daily  . budesonide  0.25 mg Nebulization Daily  . hydrochlorothiazide  12.5 mg Oral Daily  . insulin aspart  0-5 Units Subcutaneous QHS  . insulin aspart  0-9 Units Subcutaneous TID WC  . iron polysaccharides  150 mg Oral Daily  . losartan  50 mg Oral Daily  . montelukast  10 mg Oral QHS  . pantoprazole  40 mg Oral Daily  . potassium chloride   10 mEq Intravenous Q1 Hr x 3  . sertraline  100 mg Oral Daily  . sodium chloride flush  3 mL Intravenous Q12H   Continuous: . sodium chloride 50 mL/hr at 03/11/16 0545    Results for orders placed or performed during the hospital encounter of 03/10/16 (from the past 24 hour(s))  CBC with Differential     Status: Abnormal   Collection Time: 03/10/16  7:41 PM  Result Value Ref Range   WBC 3.7 (L) 4.0 - 10.5 K/uL   RBC 4.23 3.87 - 5.11 MIL/uL   Hemoglobin 8.2 (L) 12.0 - 15.0 g/dL   HCT 16.1 (L) 09.6 - 04.5 %   MCV 72.3 (L) 78.0 - 100.0 fL   MCH 19.4 (L) 26.0 - 34.0 pg   MCHC 26.8 (L) 30.0 - 36.0 g/dL   RDW 40.9 (H) 81.1 - 91.4 %   Platelets 237 150 - 400 K/uL   Neutrophils Relative % 17 %   Lymphocytes Relative 62 %   Monocytes Relative 21 %   Eosinophils Relative 0 %   Basophils Relative 0 %   Neutro Abs 0.6 (L) 1.7 - 7.7 K/uL   Lymphs Abs 2.3 0.7 - 4.0 K/uL   Monocytes Absolute 0.8 0.1 - 1.0 K/uL   Eosinophils Absolute 0.0 0.0 - 0.7 K/uL   Basophils Absolute 0.0 0.0 - 0.1 K/uL   RBC Morphology POLYCHROMASIA PRESENT   Comprehensive metabolic panel     Status: Abnormal   Collection Time: 03/10/16  7:41 PM  Result Value Ref Range   Sodium 134 (L) 135 - 145 mmol/L   Potassium 2.9 (L) 3.5 - 5.1 mmol/L   Chloride 86 (L) 101 - 111 mmol/L   CO2 38 (H) 22 - 32 mmol/L   Glucose, Bld 157 (H) 65 - 99 mg/dL   BUN 8 6 - 20 mg/dL   Creatinine, Ser 7.82 0.44 - 1.00 mg/dL   Calcium 8.8 (L) 8.9 - 10.3 mg/dL   Total Protein 7.7 6.5 - 8.1 g/dL   Albumin 3.5 3.5 - 5.0 g/dL   AST 20 15 - 41 U/L   ALT 11 (L) 14 - 54 U/L   Alkaline Phosphatase 48 38 - 126 U/L   Total Bilirubin 0.7 0.3 - 1.2 mg/dL   GFR calc non Af Amer >60 >60 mL/min   GFR calc Af Amer >60 >60 mL/min   Anion gap 10 5 - 15  Pathologist smear review     Status: None   Collection Time: 03/10/16  7:41 PM  Result Value Ref Range   Path Review Neutropenia and   Urinalysis, Routine w reflex microscopic (not at [facility])      Status: Abnormal   Collection Time: 03/10/16 11:20 PM  Result Value Ref Range   Color, Urine AMBER (A) YELLOW   APPearance CLOUDY (A) CLEAR   Specific Gravity, Urine 1.021 1.005 - 1.030   pH 6.5 5.0 -  8.0   Glucose, UA NEGATIVE NEGATIVE mg/dL   Hgb urine dipstick LARGE (A) NEGATIVE   Bilirubin Urine NEGATIVE NEGATIVE   Ketones, ur NEGATIVE NEGATIVE mg/dL   Protein, ur 30 (A) NEGATIVE mg/dL   Nitrite NEGATIVE NEGATIVE   Leukocytes, UA TRACE (A) NEGATIVE  Urine microscopic-add on     Status: Abnormal   Collection Time: 03/10/16 11:20 PM  Result Value Ref Range   Squamous Epithelial / LPF 6-30 (A) NONE SEEN   WBC, UA 0-5 0 - 5 WBC/hpf   RBC / HPF 6-30 0 - 5 RBC/hpf   Bacteria, UA MANY (A) NONE SEEN  Magnesium     Status: None   Collection Time: 03/10/16 11:38 PM  Result Value Ref Range   Magnesium 1.7 1.7 - 2.4 mg/dL  Glucose, capillary     Status: Abnormal   Collection Time: 03/11/16  5:25 AM  Result Value Ref Range   Glucose-Capillary 154 (H) 65 - 99 mg/dL  CBC     Status: Abnormal   Collection Time: 03/11/16  6:57 AM  Result Value Ref Range   WBC 2.8 (L) 4.0 - 10.5 K/uL   RBC 3.82 (L) 3.87 - 5.11 MIL/uL   Hemoglobin 7.4 (L) 12.0 - 15.0 g/dL   HCT 16.128.1 (L) 09.636.0 - 04.546.0 %   MCV 73.6 (L) 78.0 - 100.0 fL   MCH 19.4 (L) 26.0 - 34.0 pg   MCHC 26.3 (L) 30.0 - 36.0 g/dL   RDW 40.919.1 (H) 81.111.5 - 91.415.5 %   Platelets 220 150 - 400 K/uL  Comprehensive metabolic panel     Status: Abnormal   Collection Time: 03/11/16  6:57 AM  Result Value Ref Range   Sodium 137 135 - 145 mmol/L   Potassium 3.1 (L) 3.5 - 5.1 mmol/L   Chloride 90 (L) 101 - 111 mmol/L   CO2 42 (H) 22 - 32 mmol/L   Glucose, Bld 158 (H) 65 - 99 mg/dL   BUN 7 6 - 20 mg/dL   Creatinine, Ser 7.820.49 0.44 - 1.00 mg/dL   Calcium 8.4 (L) 8.9 - 10.3 mg/dL   Total Protein 6.7 6.5 - 8.1 g/dL   Albumin 3.1 (L) 3.5 - 5.0 g/dL   AST 17 15 - 41 U/L   ALT 11 (L) 14 - 54 U/L   Alkaline Phosphatase 44 38 - 126 U/L   Total Bilirubin  0.4 0.3 - 1.2 mg/dL   GFR calc non Af Amer >60 >60 mL/min   GFR calc Af Amer >60 >60 mL/min   Anion gap 5 5 - 15  TSH     Status: None   Collection Time: 03/11/16  6:57 AM  Result Value Ref Range   TSH 0.550 0.350 - 4.500 uIU/mL  Troponin I     Status: None   Collection Time: 03/11/16  6:57 AM  Result Value Ref Range   Troponin I <0.03 <0.03 ng/mL  Glucose, capillary     Status: Abnormal   Collection Time: 03/11/16  7:20 AM  Result Value Ref Range   Glucose-Capillary 153 (H) 65 - 99 mg/dL  Glucose, capillary     Status: Abnormal   Collection Time: 03/11/16 11:24 AM  Result Value Ref Range   Glucose-Capillary 131 (H) 65 - 99 mg/dL  CBC     Status: Abnormal   Collection Time: 03/11/16  2:24 PM  Result Value Ref Range   WBC 3.6 (L) 4.0 - 10.5 K/uL   RBC 4.11 3.87 - 5.11 MIL/uL  Hemoglobin 7.9 (L) 12.0 - 15.0 g/dL   HCT 16.1 (L) 09.6 - 04.5 %   MCV 74.2 (L) 78.0 - 100.0 fL   MCH 19.2 (L) 26.0 - 34.0 pg   MCHC 25.9 (L) 30.0 - 36.0 g/dL   RDW 40.9 (H) 81.1 - 91.4 %   Platelets 228 150 - 400 K/uL  Prepare RBC     Status: None   Collection Time: 03/11/16  2:24 PM  Result Value Ref Range   Order Confirmation ORDER PROCESSED BY BLOOD BANK   Type and screen [facility]     Status: None (Preliminary result)   Collection Time: 03/11/16  2:31 PM  Result Value Ref Range   ABO/RH([name]) A POS    Antibody Screen NEG    Sample Expiration 03/14/2016    Unit Number [id number]    Blood Component Type RED CELLS,LR    Unit division 00    Status of Unit ALLOCATED    Transfusion Status OK TO TRANSFUSE    Crossmatch Result Compatible      Dg Chest 2 View  Result Date: 03/11/2016 CLINICAL DATA:  50 year old female with shortness of breath. EXAM: CHEST  2 VIEW COMPARISON:  Chest radiograph dated 09/17/2015 FINDINGS: Two views of the chest demonstrate shallow inspiration. No focal consolidation, pleural effusion, or pneumothorax. Stable top-normal cardiac size. No acute  osseous pathology. IMPRESSION: No active cardiopulmonary disease. Electronically Signed   By: Elgie Collard M.[name].   On: 03/11/2016 00:50   Ct Head Wo Contrast  Result Date: 03/11/2016 CLINICAL DATA:  50 year old female with bilateral leg weakness. EXAM: CT HEAD WITHOUT CONTRAST TECHNIQUE: Contiguous axial images were obtained from the base of the skull through the vertex without intravenous contrast. COMPARISON:  Head CT dated 09/17/2015 FINDINGS: Brain: The ventricles and sulci are appropriate size for patient's age. Minimal periventricular and deep white matter chronic microvascular ischemic changes noted. There is no acute intracranial hemorrhage. No mass effect or midline shift noted. Vascular: No hyperdense vessel or unexpected calcification. Skull: Normal. Negative for fracture or focal lesion. Sinuses/Orbits: No acute finding. Other: None IMPRESSION: No acute intracranial pathology. Electronically Signed   By: Elgie Collard M.[name].   On: 03/11/2016 04:59    ROS:  As stated above in the HPI otherwise negative.  Blood pressure 132/74, pulse 95, temperature 99.5 F (37.5 C), temperature source Oral, resp. rate 18, height 5\' 6"  (1.676 m), weight 107.5 kg (237 lb 1.6 oz), SpO2 95 %.    PE: Gen: NAD, Alert and Oriented HEENT:  [place]/AT, EOMI Neck: Supple, no LAD Lungs: CTA Bilaterally CV: RRR without M/G/R ABM: Soft, NTND, +BS Ext: No C/C/E  Assessment/Plan: 1) Hematochezia most likely from hemorrhoids. 2) Left sided diverticula. 3) Severe OSA.   The patient most likely has bleeding from her hemorrhoids.  During the April evaluation Surgery was consulted and they requested another colonoscopy to localize the bleeding source.  Unfortunately, her respiratory status rapidly declined, however, the FFS did localize the bleeding to her hemorrhoids.  This was a default conclusion as the stool in the sigmoid colon was negative for any evidence of blood.  She only exhibited green stool.  At this  time, her HGB is stable, but it will be reasonable for her to undergo a repeat Surgical evaluation.  This can be performed as an inpatient versus outpatient.  During this presentation, I do not believe there is any concern that her bleeding is coming from a more proximal source, i.e., cecum, with  her history of the cecal AVM.    Plan: 1) Supportive care. 2) Surgical consultation inpatient versus outpatient. 3) Follow HGB.  No GI intervention is necessary at this time, unless her clinical status markedly changes. Shelley [name] 03/11/2016, 4:17 PM

## 2016-03-12 DIAGNOSIS — K625 Hemorrhage of anus and rectum: Secondary | ICD-10-CM | POA: Diagnosis not present

## 2016-03-12 LAB — GLUCOSE, CAPILLARY
GLUCOSE-CAPILLARY: 124 mg/dL — AB (ref 65–99)
GLUCOSE-CAPILLARY: 107 mg/dL — AB (ref 65–99)
GLUCOSE-CAPILLARY: 199 mg/dL — AB (ref 65–99)
Glucose-Capillary: 142 mg/dL — ABNORMAL HIGH (ref 65–99)

## 2016-03-12 LAB — CBC
HCT: 32.9 % — ABNORMAL LOW (ref 36.0–46.0)
HCT: 32.1 % — ABNORMAL LOW (ref 36.0–46.0)
Hemoglobin: 8.8 g/dL — ABNORMAL LOW (ref 12.0–15.0)
Hemoglobin: 8.6 g/dL — ABNORMAL LOW (ref 12.0–15.0)
MCH: 20.1 pg — AB (ref 26.0–34.0)
MCH: 20 pg — ABNORMAL LOW (ref 26.0–34.0)
MCHC: 26.7 g/dL — AB (ref 30.0–36.0)
MCHC: 26.8 g/dL — ABNORMAL LOW (ref 30.0–36.0)
MCV: 75.1 fL — ABNORMAL LOW (ref 78.0–100.0)
MCV: 74.7 fL — ABNORMAL LOW (ref 78.0–100.0)
PLATELETS: 219 10*3/uL (ref 150–400)
PLATELETS: 234 10*3/uL (ref 150–400)
RBC: 4.38 MIL/uL (ref 3.87–5.11)
RBC: 4.3 MIL/uL (ref 3.87–5.11)
RDW: 19.1 % — ABNORMAL HIGH (ref 11.5–15.5)
RDW: 18.9 % — AB (ref 11.5–15.5)
WBC: 2.9 10*3/uL — ABNORMAL LOW (ref 4.0–10.5)
WBC: 3.5 10*3/uL — AB (ref 4.0–10.5)

## 2016-03-12 LAB — HEMOGLOBIN A1C
Hgb A1c MFr Bld: 6 % — ABNORMAL HIGH (ref 4.8–5.6)
Mean Plasma Glucose: 126 mg/dL

## 2016-03-12 NOTE — Progress Notes (Signed)
PROGRESS NOTE    Shelley [name]  WUJ:[id number] DOB: December 21, 1965 DOA: 03/10/2016 PCP: Astrid Divine, MD   Brief Narrative:   50 y.o. 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  Pt notes has been bleeding from her rectum x1 week.    Assessment & Plan:   Active Problems:    Rectal bleeding - Consulted GI: thought is patient has hemorrhoids. No bleeding currently reported. Will monitor another 23 hours if patient has bleed then will consider consulting general surgery as per GI recommendations. - s/p transfusion 1 unit of blood  Diabetes mellitus type 2, noninsulin dependent (HCC) - SSI - discontinuing metformin    Hyponatremia - resolved, will check again next am.  Hypokalemia - Will administer potassium replacement - place order to reassess next am.   DVT prophylaxis: SCD's Code Status: Full Family Communication: d/c patient directly Disposition Plan: Pending stable cbc next am and no more bleeding.   Consultants:   GI   Procedures: transfusion PRBC   Antimicrobials: None  Subjective: Pt has no new complaints. No acute issues overnight. Denies any active bleeding.  Objective: Vitals:   03/12/16 0344 03/12/16 0458 03/12/16 0906 03/12/16 0933  BP: 140/83 138/70 (!) 106/24   Pulse: 88 90 85   Resp: 14 16 17    Temp: 99.2 F (37.3 C) 98.9 F (37.2 C) 98.9 F (37.2 C)   TempSrc: Oral Oral Oral   SpO2: 100% 99% 100% 100%  Weight:      Height:        Intake/Output Summary (Last 24 hours) at 03/12/16 1644 Last data filed at 03/12/16 1452  Gross per 24 hour  Intake              395 ml  Output              550 ml  Net             -155 ml   Filed Weights   03/10/16 1922 03/11/16 0510 03/11/16 2114  Weight: 107 kg (236 lb) 107.5 kg (237 lb 1.6 oz) 107.5 kg (236 lb 15.9 oz)    Examination:  General exam: Appears calm and comfortable, in  NAD. Respiratory system: Clear to auscultation. Respiratory effort normal. No wheezes Cardiovascular system: S1 & S2 heard, RRR. No JVD, murmurs, rubs, gallops or clicks. No pedal edema. Gastrointestinal system: Abdomen is nondistended, soft and nontender. Obese hypoactive bowel sounds Central nervous system: Alert and oriented. No focal neurological deficits. Extremities: Symmetric 5 x 5 power. Skin: No rashes, lesions or ulcers Psychiatry: Judgement and insight appear normal. Mood & affect appropriate.    Data Reviewed: I have personally reviewed following labs and imaging studies  CBC:  Recent Labs Lab 03/10/16 1941 03/11/16 0657 03/11/16 1424 03/11/16 2159 03/12/16 0554 03/12/16 1417  WBC 3.7* 2.8* 3.6* 3.8* 2.9* 3.5*  NEUTROABS 0.6*  --   --   --   --   --   HGB 8.2* 7.4* 7.9* 7.7* 8.8* 8.6*  HCT 30.6* 28.1* 30.5* 30.1* 32.9* 32.1*  MCV 72.3* 73.6* 74.2* 74.0* 75.1* 74.7*  PLT 237 220 228 226 219 234   Basic Metabolic Panel:  Recent Labs Lab 03/10/16 1941 03/10/16 2338 03/11/16 0657 03/11/16 2159  NA 134*  --  137 138  K 2.9*  --  3.1* 3.1*  CL 86*  --  90* 90*  CO2 38*  --  42* 42*  GLUCOSE 157*  --  158* 117*  BUN 8  --  7 8  CREATININE 0.59  --  0.49 0.53  CALCIUM 8.8*  --  8.4* 8.6*  MG  --  1.7  --   --    GFR: Estimated Creatinine Clearance: 104.4 mL/min (by C-G formula based on SCr of 0.53 mg/dL). Liver Function Tests:  Recent Labs Lab 03/10/16 1941 03/11/16 0657  AST 20 17  ALT 11* 11*  ALKPHOS 48 44  BILITOT 0.7 0.4  PROT 7.7 6.7  ALBUMIN 3.5 3.1*   No results for input(s): LIPASE, AMYLASE in the last 168 hours. No results for input(s): AMMONIA in the last 168 hours. Coagulation Profile: No results for input(s): INR, PROTIME in the last 168 hours. Cardiac Enzymes:  Recent Labs Lab 03/11/16 0657  TROPONINI <0.03   BNP (last 3 results) No results for input(s): PROBNP in the last 8760 hours. HbA1C:  Recent Labs  03/11/16 0657   HGBA1C 6.0*   CBG:  Recent Labs Lab 03/11/16 1124 03/11/16 1934 03/11/16 2113 03/12/16 0742 03/12/16 1131  GLUCAP 131* 117* 112* 142* 124*   Lipid Profile: No results for input(s): CHOL, HDL, LDLCALC, TRIG, CHOLHDL, LDLDIRECT in the last 72 hours. Thyroid Function Tests:  Recent Labs  03/11/16 0657  TSH 0.550   Anemia Panel: No results for input(s): VITAMINB12, FOLATE, FERRITIN, TIBC, IRON, RETICCTPCT in the last 72 hours. Sepsis Labs: No results for input(s): PROCALCITON, LATICACIDVEN in the last 168 hours.  No results found for this or any previous visit (from the past 240 hour(s)).       Radiology Studies: Dg Chest 2 View  Result Date: 03/11/2016 CLINICAL DATA:  50 year old female with shortness of breath. EXAM: CHEST  2 VIEW COMPARISON:  Chest radiograph dated 09/17/2015 FINDINGS: Two views of the chest demonstrate shallow inspiration. No focal consolidation, pleural effusion, or pneumothorax. Stable top-normal cardiac size. No acute osseous pathology. IMPRESSION: No active cardiopulmonary disease. Electronically Signed   By: Elgie Collard M.D.   On: 03/11/2016 00:50   Ct Head Wo Contrast  Result Date: 03/11/2016 CLINICAL DATA:  50 year old female with bilateral leg weakness. EXAM: CT HEAD WITHOUT CONTRAST TECHNIQUE: Contiguous axial images were obtained from the base of the skull through the vertex without intravenous contrast. COMPARISON:  Head CT dated 09/17/2015 FINDINGS: Brain: The ventricles and sulci are appropriate size for patient's age. Minimal periventricular and deep white matter chronic microvascular ischemic changes noted. There is no acute intracranial hemorrhage. No mass effect or midline shift noted. Vascular: No hyperdense vessel or unexpected calcification. Skull: Normal. Negative for fracture or focal lesion. Sinuses/Orbits: No acute finding. Other: None IMPRESSION: No acute intracranial pathology. Electronically Signed   By: Elgie Collard M.D.    On: 03/11/2016 04:59        Scheduled Meds: . sodium chloride   Intravenous Once  . atorvastatin  10 mg Oral Daily  . budesonide  0.25 mg Nebulization Daily  . hydrochlorothiazide  12.5 mg Oral Daily  . insulin aspart  0-5 Units Subcutaneous QHS  . insulin aspart  0-9 Units Subcutaneous TID WC  . iron polysaccharides  150 mg Oral Daily  . losartan  50 mg Oral Daily  . montelukast  10 mg Oral QHS  . pantoprazole  40 mg Oral Daily  . sertraline  100 mg Oral Daily  . sodium chloride flush  3 mL Intravenous Q12H   Continuous Infusions:     LOS: 0 days   Time spent: > 35 minutes  Chamar Broughton,  Pamala HurryLANDO, MD [facility] Pager [phone]  If 7PM-7AM, please contact night-coverage [email or web address] Password [email or web address] 03/12/2016, 4:44 PM

## 2016-03-13 DIAGNOSIS — K625 Hemorrhage of anus and rectum: Secondary | ICD-10-CM | POA: Diagnosis not present

## 2016-03-13 DIAGNOSIS — E871 Hypo-osmolality and hyponatremia: Secondary | ICD-10-CM | POA: Diagnosis not present

## 2016-03-13 LAB — CBC
HEMATOCRIT: 36.3 % (ref 36.0–46.0)
HEMOGLOBIN: 9.7 g/dL — AB (ref 12.0–15.0)
MCH: 20.1 pg — ABNORMAL LOW (ref 26.0–34.0)
MCHC: 26.7 g/dL — AB (ref 30.0–36.0)
MCV: 75.2 fL — ABNORMAL LOW (ref 78.0–100.0)
Platelets: 257 10*3/uL (ref 150–400)
RBC: 4.83 MIL/uL (ref 3.87–5.11)
RDW: 19.7 % — ABNORMAL HIGH (ref 11.5–15.5)
WBC: 3.6 10*3/uL — ABNORMAL LOW (ref 4.0–10.5)

## 2016-03-13 LAB — TYPE AND SCREEN
ABO/RH(D): A POS
Antibody Screen: NEGATIVE
UNIT DIVISION: 0

## 2016-03-13 LAB — BASIC METABOLIC PANEL
Anion gap: 10 (ref 5–15)
BUN: 10 mg/dL (ref 6–20)
CO2: 41 mmol/L — AB (ref 22–32)
Calcium: 9.2 mg/dL (ref 8.9–10.3)
Chloride: 89 mmol/L — ABNORMAL LOW (ref 101–111)
Creatinine, Ser: 0.55 mg/dL (ref 0.44–1.00)
GFR calc Af Amer: >60 mL/min (ref >60)
GLUCOSE: 129 mg/dL — AB (ref 65–99)
POTASSIUM: 3.5 mmol/L (ref 3.5–5.1)
Sodium: 140 mmol/L (ref 135–145)

## 2016-03-13 LAB — GLUCOSE, CAPILLARY
GLUCOSE-CAPILLARY: 176 mg/dL — AB (ref 65–99)
Glucose-Capillary: 145 mg/dL — ABNORMAL HIGH (ref 65–99)

## 2016-03-13 NOTE — Progress Notes (Signed)
Patient discharge teaching given, including activity, diet, follow-up appoints, and medications. Patient verbalized understanding of all discharge instructions. IV access was d/c'd. Vitals are stable. Skin is intact except as charted in most recent assessments. Pt to be escorted out by NT, to be driven home by family. Christen Butterhris L -RN

## 2016-03-13 NOTE — Discharge Summary (Signed)
Physician Discharge Summary  Shelley [name]:[id number] DOB: 08-24-1965 DOA: 03/10/2016  PCP: Astrid Divine, MD  Admit date: 03/10/2016 Discharge date: 03/13/2016  Time spent: > 35  minutes  Recommendations for Outpatient Follow-up:  1. Pt will need Home health Physical Therapy according to our evaluation by our in house physical therapy team. 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 Pt notes has been bleeding from her rectum x1 week.   Hospital Course:  Active Problems:    Rectal bleeding - Consulted GI: thought is patient has hemorrhoids. No bleeding currently reported. Hgb better on day of d/c  Diabetes mellitus type 2, noninsulin dependent (HCC) - Pt to continue home medication regimen on d/c metformin    Hyponatremia - resolved  Hypokalemia - resolved  Procedures:  None  Consultations:  GI: Dr. Elnoria Howard  Discharge Exam: Vitals:   03/13/16 0626 03/13/16 0955  BP: 131/66 (!) 139/103  Pulse: 92 (!) 101  Resp: 18 16  Temp: 98.7 F (37.1 C) 99.5 F (37.5 C)    General: Pt in nad, alert and awake Cardiovascular: rrr, no rubs Respiratory: no increased wob, no wheezes  Discharge Instructions   Discharge Instructions    Call MD for:  extreme fatigue    Complete by:  As directed    Call MD for:  temperature >100.4    Complete by:  As directed    Diet - low sodium heart healthy    Complete by:  As  directed    Discharge instructions    Complete by:  As directed    Please be sure to follow up with your GI specialist or pcp 1-2 weeks after hospital discharge.   Increase activity slowly    Complete by:  As directed      Current Discharge Medication List    CONTINUE these medications which have NOT CHANGED   Details  albuterol (PROVENTIL) (2.5 MG/3ML) 0.083% nebulizer solution Take 3 mLs (2.5 mg total) by nebulization every 6 (six) hours as needed for wheezing or shortness of breath. Qty: 120 vial, Refills: 5    atorvastatin (LIPITOR) 10 MG tablet Take 10 mg by mouth daily.    budesonide (PULMICORT) 0.25 MG/2ML nebulizer solution Take 0.25 mg by nebulization every morning.     famotidine (PEPCID) 20 MG tablet Take 1 tablet (20 mg total) by mouth at bedtime. Qty: 30 tablet, Refills: 0    FERREX 150 150 MG capsule Take 150 mg by mouth daily. Refills: 6    loratadine (CLARITIN) 10 MG tablet Take 10 mg by mouth daily as needed for allergies.     losartan-hydrochlorothiazide (HYZAAR) 50-12.5 MG tablet Take 1 tablet by mouth daily. Qty: 30 tablet, Refills: 0    metFORMIN (GLUCOPHAGE) 500 MG tablet Take 1,000 mg by mouth every evening.     montelukast (SINGULAIR) 10 MG tablet Take 10 mg by mouth at bedtime.    omeprazole (PRILOSEC) 20 MG capsule Take 40 mg by mouth daily.  sertraline (ZOLOFT) 50 MG tablet Take 100 mg by mouth daily.        Allergies  Allergen Reactions  . Fish Allergy Swelling  . Pepto-Bismol [Bismuth Subsalicylate] Nausea And Vomiting  . Cortisone Itching and Rash    Injections and cream only.  PO ok.  . Trichophyton Itching    Also sneezing accompanying as well      The results of significant diagnostics from this hospitalization (including imaging, microbiology, ancillary and laboratory) are listed below for reference.    Significant Diagnostic Studies: Dg Chest 2 View  Result Date: 03/11/2016 CLINICAL DATA:  50 year old female with shortness  of breath. EXAM: CHEST  2 VIEW COMPARISON:  Chest radiograph dated 09/17/2015 FINDINGS: Two views of the chest demonstrate shallow inspiration. No focal consolidation, pleural effusion, or pneumothorax. Stable top-normal cardiac size. No acute osseous pathology. IMPRESSION: No active cardiopulmonary disease. Electronically Signed   By: Elgie Collard M.D.   On: 03/11/2016 00:50   Ct Head Wo Contrast  Result Date: 03/11/2016 CLINICAL DATA:  50 year old female with bilateral leg weakness. EXAM: CT HEAD WITHOUT CONTRAST TECHNIQUE: Contiguous axial images were obtained from the base of the skull through the vertex without intravenous contrast. COMPARISON:  Head CT dated 09/17/2015 FINDINGS: Brain: The ventricles and sulci are appropriate size for patient's age. Minimal periventricular and deep white matter chronic microvascular ischemic changes noted. There is no acute intracranial hemorrhage. No mass effect or midline shift noted. Vascular: No hyperdense vessel or unexpected calcification. Skull: Normal. Negative for fracture or focal lesion. Sinuses/Orbits: No acute finding. Other: None IMPRESSION: No acute intracranial pathology. Electronically Signed   By: Elgie Collard M.D.   On: 03/11/2016 04:59    Microbiology: No results found for this or any previous visit (from the past 240 hour(s)).   Labs: Basic Metabolic Panel:  Recent Labs Lab 03/10/16 1941 03/10/16 2338 03/11/16 0657 03/11/16 2159 03/13/16 0554  NA 134*  --  137 138 140  K 2.9*  --  3.1* 3.1* 3.5  CL 86*  --  90* 90* 89*  CO2 38*  --  42* 42* 41*  GLUCOSE 157*  --  158* 117* 129*  BUN 8  --  7 8 10   CREATININE 0.59  --  0.49 0.53 0.55  CALCIUM 8.8*  --  8.4* 8.6* 9.2  MG  --  1.7  --   --   --    Liver Function Tests:  Recent Labs Lab 03/10/16 1941 03/11/16 0657  AST 20 17  ALT 11* 11*  ALKPHOS 48 44  BILITOT 0.7 0.4  PROT 7.7 6.7  ALBUMIN 3.5 3.1*   No results for input(s): LIPASE, AMYLASE in the last 168  hours. No results for input(s): AMMONIA in the last 168 hours. CBC:  Recent Labs Lab 03/10/16 1941  03/11/16 1424 03/11/16 2159 03/12/16 0554 03/12/16 1417 03/13/16 0554  WBC 3.7*  < > 3.6* 3.8* 2.9* 3.5* 3.6*  NEUTROABS 0.6*  --   --   --   --   --   --   HGB 8.2*  < > 7.9* 7.7* 8.8* 8.6* 9.7*  HCT 30.6*  < > 30.5* 30.1* 32.9* 32.1* 36.3  MCV 72.3*  < > 74.2* 74.0* 75.1* 74.7* 75.2*  PLT 237  < > 228 226 219 234 257  < > = values in this interval not displayed. Cardiac Enzymes:  Recent Labs Lab 03/11/16 0657  TROPONINI <0.03   BNP: BNP (last 3 results) No results for  input(s): BNP in the last 8760 hours.  ProBNP (last 3 results) No results for input(s): PROBNP in the last 8760 hours.  CBG:  Recent Labs Lab 03/12/16 1131 03/12/16 1706 03/12/16 2116 03/13/16 0815 03/13/16 1153  GLUCAP 124* 107* 199* 145* 176*    Signed:  Penny PiaVEGA, Harlea Goetzinger MD.  [facility] 03/13/2016, 1:29 PM

## 2016-03-13 NOTE — Care Management Note (Signed)
Case Management Note  Patient Details  Name: Shelley [name] MRN: [id number] Date of Birth: 12-06-1965  Subjective/Objective:                    Action/Plan:   Expected Discharge Date:                  Expected Discharge Plan:  [place]  In-House Referral:     Discharge planning Services  CM Consult  Post Acute Care Choice:  Home Health Choice offered to:  Patient, [facility] POA / Guardian  DME Arranged:  N/A DME Agency:  NA  HH Arranged:  PT HH Agency:  [place]  Status of Service:  Completed, signed off  If discussed at [place] of Stay Meetings, dates discussed:    Additional Comments: CM met with pt and caretaker, Gay Filler [phone] who is here to take pt home.  Gay Filler states pt already has a wheelchair in the car and has a portable tank of O2 in the car.  Gay Filler states pt has [place] and CM called [employer], Georgina Snell with referral.  No other CM needs were communicated. Dellie Catholic, RN 03/13/2016, 2:45 PM

## 2016-04-26 ENCOUNTER — Encounter: Payer: Self-pay | Admitting: Pulmonary Disease

## 2016-04-26 ENCOUNTER — Ambulatory Visit (INDEPENDENT_AMBULATORY_CARE_PROVIDER_SITE_OTHER): Payer: Medicare Other | Admitting: Pulmonary Disease

## 2016-04-26 VITALS — BP 126/72 | HR 121 | Ht 67.0 in | Wt 238.4 lb

## 2016-04-26 DIAGNOSIS — J45998 Other asthma: Secondary | ICD-10-CM | POA: Diagnosis not present

## 2016-04-26 DIAGNOSIS — E662 Morbid (severe) obesity with alveolar hypoventilation: Secondary | ICD-10-CM | POA: Diagnosis not present

## 2016-04-26 DIAGNOSIS — J9610 Chronic respiratory failure, unspecified whether with hypoxia or hypercapnia: Secondary | ICD-10-CM | POA: Diagnosis not present

## 2016-04-26 DIAGNOSIS — G4733 Obstructive sleep apnea (adult) (pediatric): Secondary | ICD-10-CM

## 2016-04-26 NOTE — Progress Notes (Signed)
Current Outpatient Prescriptions on File Prior to Visit  Medication Sig  . albuterol (PROVENTIL) (2.5 MG/3ML) 0.083% nebulizer solution Take 3 mLs (2.5 mg total) by nebulization every 6 (six) hours as needed for wheezing or shortness of breath.  Marland Kitchen. atorvastatin (LIPITOR) 10 MG tablet Take 10 mg by mouth daily.  . budesonide (PULMICORT) 0.25 MG/2ML nebulizer solution Take 0.25 mg by nebulization every morning.   . famotidine (PEPCID) 20 MG tablet Take 1 tablet (20 mg total) by mouth at bedtime.  Marland Kitchen. FERREX 150 150 MG capsule Take 150 mg by mouth daily.  Marland Kitchen. loratadine (CLARITIN) 10 MG tablet Take 10 mg by mouth daily as needed for allergies.   Marland Kitchen. losartan-hydrochlorothiazide (HYZAAR) 50-12.5 MG tablet Take 1 tablet by mouth daily.  . metFORMIN (GLUCOPHAGE) 500 MG tablet Take 1,000 mg by mouth every evening.   . montelukast (SINGULAIR) 10 MG tablet Take 10 mg by mouth at bedtime.  Marland Kitchen. omeprazole (PRILOSEC) 20 MG capsule Take 40 mg by mouth daily.  . sertraline (ZOLOFT) 50 MG tablet Take 100 mg by mouth daily.    No current facility-administered medications on file prior to visit.     Chief Complaint  Patient presents with  . Follow-up    65mo rov. pt states she last worn bipap about 2 months ago. pt states breathing is fair. pt c/o cough with yellow mucus & wheezing    Sleep tests PSG 09/13/10>>AHI 98.6, SpO2 60% ONO with CPAP and 3 liters 01/13/12>>Test time 7 hrs 6 min. Mean SpO2 89%, low SpO2 62%. Spent 2 hrs 34 min with SpO2 < 88% CPAP 12/16/11 to 01/14/12>>Used on 6 of 30 nights with average 7 hrs 3 min. Average AHI 1.8 with CPAP 10 cm H2O. ONO with CPAP and 3 liters 01/13/12 >> Test time 7 hrs 6 min.  Mean SpO2 89%, low SpO2 62%.  Spent 2 hrs 34 min with SpO2 < 88%. ONO with CPAP and 4 liters 01/31/12 >> Test time 7 hrs 36 min.  Mean SpO2 91.6%, low SpO2 73%.  Spent 56 min with SpO2 < 88%. BPAP 02/20/12>>17/11 cm H2O with 4 liters oxygen.  Past medical history DM, HTN, HLD, Anxiety, GERD,  HH, Developmental delay  Past surgical history, Family history, Social history, Allergies reviewed  Vital signs BP 126/72 (BP Location: Left Arm, Cuff Size: Normal)   Pulse (!) 121   Ht 5\' 7"  (1.702 m)   Wt 238 lb 6.4 oz (108.1 kg)   SpO2 96%   BMI 37.34 kg/m   History of Present Illness: Shelley [name] is a 50 y.o. female with OSA, OHS, and asthma.  She hasn't been using Bipap recently.  She falls asleep, and forgets to wear mask.  She wasn't having any difficulty with pressure setting or mask otherwise.  She is still using oxygen.  She has found that 2 to 3 liters during the day is sufficient.  She is not having cough, wheeze, or chest discomfort.  Physical Exam: General - No distress, wearing oxygen ENT - No sinus tenderness, MP 4, no oral exudate, no LAN Cardiac - s1s2 regular, no murmur Chest - decreased breath sounds, no wheeze/rales Back - no focal tenderness Abd - soft, non-tender Ext - no edema Neuro - normal strength Skin - no rashes Psych - pleasant demeanor  Assessment/Plan:  Obstructive sleep apnea. - discussed techniques to ensure she uses Bipap on regular basis  Chronic respiratory failure with hypoxia from OHS. - she is to continue 4 liters oxygen  at night with BiPAP - she can adjust oxygen during the day based on her pulse ox reading >> advised that she should keep her SpO2 > 90%  Asthma. - continue pulmicort >> she is only using once per day - continue singulair qhs - she can use prn albuterol >> discussed when she should use albuterol  Morbid obesity. - discussed importance of weight loss  Goals of care. - DNR/DNI   Patient Instructions  Use your Bipap every night  Can use 2 liters oxygen during the day and 4 liters at night with Bipap  Follow up in 6 months   Coralyn HellingVineet Taytum Wheller, MD [place] [facility] Pager:  [phone] 04/26/2016, 3:44 PM

## 2016-04-26 NOTE — Patient Instructions (Addendum)
Use your Bipap every night  Can use 2 liters oxygen during the day and 4 liters at night with Bipap  Follow up in 6 months

## 2016-05-03 ENCOUNTER — Encounter (HOSPITAL_COMMUNITY): Payer: Self-pay | Admitting: *Deleted

## 2016-05-03 ENCOUNTER — Inpatient Hospital Stay (HOSPITAL_COMMUNITY)
Admission: EM | Admit: 2016-05-03 | Discharge: 2016-05-14 | DRG: 347 | Disposition: A | Discharge status: Home Health | Payer: Medicare Other | Attending: Internal Medicine | Admitting: Internal Medicine

## 2016-05-03 DIAGNOSIS — Z91013 Allergy to seafood: Secondary | ICD-10-CM

## 2016-05-03 DIAGNOSIS — N179 Acute kidney failure, unspecified: Secondary | ICD-10-CM

## 2016-05-03 DIAGNOSIS — E876 Hypokalemia: Secondary | ICD-10-CM

## 2016-05-03 DIAGNOSIS — Z9981 Dependence on supplemental oxygen: Secondary | ICD-10-CM | POA: Diagnosis not present

## 2016-05-03 DIAGNOSIS — G4733 Obstructive sleep apnea (adult) (pediatric): Secondary | ICD-10-CM | POA: Diagnosis not present

## 2016-05-03 DIAGNOSIS — E66813 Obesity, class 3: Secondary | ICD-10-CM | POA: Diagnosis present

## 2016-05-03 DIAGNOSIS — K625 Hemorrhage of anus and rectum: Secondary | ICD-10-CM

## 2016-05-03 DIAGNOSIS — Z888 Allergy status to other drugs, medicaments and biological substances status: Secondary | ICD-10-CM

## 2016-05-03 DIAGNOSIS — J9622 Acute and chronic respiratory failure with hypercapnia: Secondary | ICD-10-CM | POA: Diagnosis not present

## 2016-05-03 DIAGNOSIS — N8110 Cystocele, unspecified: Secondary | ICD-10-CM | POA: Diagnosis present

## 2016-05-03 DIAGNOSIS — J4531 Mild persistent asthma with (acute) exacerbation: Secondary | ICD-10-CM | POA: Diagnosis present

## 2016-05-03 DIAGNOSIS — Z6841 Body Mass Index (BMI) 40.0 and over, adult: Secondary | ICD-10-CM | POA: Diagnosis not present

## 2016-05-03 DIAGNOSIS — R509 Fever, unspecified: Secondary | ICD-10-CM | POA: Diagnosis not present

## 2016-05-03 DIAGNOSIS — R7989 Other specified abnormal findings of blood chemistry: Secondary | ICD-10-CM | POA: Diagnosis present

## 2016-05-03 DIAGNOSIS — J9621 Acute and chronic respiratory failure with hypoxia: Secondary | ICD-10-CM | POA: Diagnosis not present

## 2016-05-03 DIAGNOSIS — E785 Hyperlipidemia, unspecified: Secondary | ICD-10-CM | POA: Diagnosis present

## 2016-05-03 DIAGNOSIS — E873 Alkalosis: Secondary | ICD-10-CM | POA: Diagnosis not present

## 2016-05-03 DIAGNOSIS — B372 Candidiasis of skin and nail: Secondary | ICD-10-CM | POA: Diagnosis not present

## 2016-05-03 DIAGNOSIS — K5909 Other constipation: Secondary | ICD-10-CM | POA: Diagnosis present

## 2016-05-03 DIAGNOSIS — D649 Anemia, unspecified: Secondary | ICD-10-CM

## 2016-05-03 DIAGNOSIS — J9610 Chronic respiratory failure, unspecified whether with hypoxia or hypercapnia: Secondary | ICD-10-CM | POA: Diagnosis present

## 2016-05-03 DIAGNOSIS — R6250 Unspecified lack of expected normal physiological development in childhood: Secondary | ICD-10-CM | POA: Diagnosis present

## 2016-05-03 DIAGNOSIS — Q2733 Arteriovenous malformation of digestive system vessel: Secondary | ICD-10-CM

## 2016-05-03 DIAGNOSIS — F329 Major depressive disorder, single episode, unspecified: Secondary | ICD-10-CM | POA: Diagnosis present

## 2016-05-03 DIAGNOSIS — Z7984 Long term (current) use of oral hypoglycemic drugs: Secondary | ICD-10-CM

## 2016-05-03 DIAGNOSIS — K922 Gastrointestinal hemorrhage, unspecified: Secondary | ICD-10-CM

## 2016-05-03 DIAGNOSIS — D500 Iron deficiency anemia secondary to blood loss (chronic): Secondary | ICD-10-CM | POA: Diagnosis present

## 2016-05-03 DIAGNOSIS — K5791 Diverticulosis of intestine, part unspecified, without perforation or abscess with bleeding: Secondary | ICD-10-CM | POA: Diagnosis present

## 2016-05-03 DIAGNOSIS — K5520 Angiodysplasia of colon without hemorrhage: Secondary | ICD-10-CM

## 2016-05-03 DIAGNOSIS — N170 Acute kidney failure with tubular necrosis: Secondary | ICD-10-CM | POA: Diagnosis not present

## 2016-05-03 DIAGNOSIS — I340 Nonrheumatic mitral (valve) insufficiency: Secondary | ICD-10-CM | POA: Diagnosis present

## 2016-05-03 DIAGNOSIS — N816 Rectocele: Secondary | ICD-10-CM | POA: Diagnosis present

## 2016-05-03 DIAGNOSIS — F419 Anxiety disorder, unspecified: Secondary | ICD-10-CM | POA: Diagnosis present

## 2016-05-03 DIAGNOSIS — K219 Gastro-esophageal reflux disease without esophagitis: Secondary | ICD-10-CM | POA: Diagnosis not present

## 2016-05-03 DIAGNOSIS — F819 Developmental disorder of scholastic skills, unspecified: Secondary | ICD-10-CM | POA: Diagnosis not present

## 2016-05-03 DIAGNOSIS — F79 Unspecified intellectual disabilities: Secondary | ICD-10-CM

## 2016-05-03 DIAGNOSIS — E662 Morbid (severe) obesity with alveolar hypoventilation: Secondary | ICD-10-CM | POA: Diagnosis not present

## 2016-05-03 DIAGNOSIS — Z8774 Personal history of (corrected) congenital malformations of heart and circulatory system: Secondary | ICD-10-CM

## 2016-05-03 DIAGNOSIS — K644 Residual hemorrhoidal skin tags: Secondary | ICD-10-CM | POA: Diagnosis present

## 2016-05-03 DIAGNOSIS — E119 Type 2 diabetes mellitus without complications: Secondary | ICD-10-CM | POA: Diagnosis present

## 2016-05-03 DIAGNOSIS — D62 Acute posthemorrhagic anemia: Secondary | ICD-10-CM | POA: Diagnosis not present

## 2016-05-03 DIAGNOSIS — J45998 Other asthma: Secondary | ICD-10-CM | POA: Diagnosis not present

## 2016-05-03 DIAGNOSIS — K648 Other hemorrhoids: Secondary | ICD-10-CM | POA: Diagnosis not present

## 2016-05-03 DIAGNOSIS — Z66 Do not resuscitate: Secondary | ICD-10-CM | POA: Diagnosis present

## 2016-05-03 DIAGNOSIS — Z79899 Other long term (current) drug therapy: Secondary | ICD-10-CM

## 2016-05-03 DIAGNOSIS — J9620 Acute and chronic respiratory failure, unspecified whether with hypoxia or hypercapnia: Secondary | ICD-10-CM | POA: Diagnosis not present

## 2016-05-03 DIAGNOSIS — Z978 Presence of other specified devices: Secondary | ICD-10-CM

## 2016-05-03 DIAGNOSIS — J9600 Acute respiratory failure, unspecified whether with hypoxia or hypercapnia: Secondary | ICD-10-CM

## 2016-05-03 DIAGNOSIS — K449 Diaphragmatic hernia without obstruction or gangrene: Secondary | ICD-10-CM | POA: Diagnosis present

## 2016-05-03 LAB — COMPREHENSIVE METABOLIC PANEL
ALT: 11 U/L — ABNORMAL LOW (ref 14–54)
AST: 16 U/L (ref 15–41)
Albumin: 3.3 g/dL — ABNORMAL LOW (ref 3.5–5.0)
Alkaline Phosphatase: 63 U/L (ref 38–126)
Anion gap: 10 (ref 5–15)
BUN: 7 mg/dL (ref 6–20)
CO2: 41 mmol/L — ABNORMAL HIGH (ref 22–32)
Calcium: 8.5 mg/dL — ABNORMAL LOW (ref 8.9–10.3)
Chloride: 88 mmol/L — ABNORMAL LOW (ref 101–111)
Creatinine, Ser: 0.45 mg/dL (ref 0.44–1.00)
GFR calc Af Amer: >60 mL/min (ref >60)
GFR calc non Af Amer: >60 mL/min (ref >60)
Glucose, Bld: 155 mg/dL — ABNORMAL HIGH (ref 65–99)
Potassium: 2.7 mmol/L — CL (ref 3.5–5.1)
Sodium: 139 mmol/L (ref 135–145)
Total Bilirubin: 0.4 mg/dL (ref 0.3–1.2)
Total Protein: 7.7 g/dL (ref 6.5–8.1)

## 2016-05-03 LAB — MAGNESIUM: Magnesium: 1.5 mg/dL — ABNORMAL LOW (ref 1.7–2.4)

## 2016-05-03 LAB — CBC
HCT: 25.4 % — ABNORMAL LOW (ref 36.0–46.0)
Hemoglobin: 7.1 g/dL — ABNORMAL LOW (ref 12.0–15.0)
MCH: 20.5 pg — ABNORMAL LOW (ref 26.0–34.0)
MCHC: 28 g/dL — ABNORMAL LOW (ref 30.0–36.0)
MCV: 73.4 fL — ABNORMAL LOW (ref 78.0–100.0)
Platelets: 277 10*3/uL (ref 150–400)
RBC: 3.46 MIL/uL — ABNORMAL LOW (ref 3.87–5.11)
RDW: 18.4 % — ABNORMAL HIGH (ref 11.5–15.5)
WBC: 4.3 10*3/uL (ref 4.0–10.5)

## 2016-05-03 LAB — HEMOGLOBIN AND HEMATOCRIT, BLOOD
HEMATOCRIT: 27.8 % — AB (ref 36.0–46.0)
Hemoglobin: 7.8 g/dL — ABNORMAL LOW (ref 12.0–15.0)

## 2016-05-03 LAB — PREPARE RBC (CROSSMATCH)

## 2016-05-03 MED ORDER — POTASSIUM CHLORIDE CRYS ER 20 MEQ PO TBCR
20.0000 meq | EXTENDED_RELEASE_TABLET | Freq: Once | ORAL | Status: AC
  Administered 2016-05-03: 20 meq via ORAL
  Filled 2016-05-03: qty 1

## 2016-05-03 MED ORDER — MONTELUKAST SODIUM 10 MG PO TABS
10.0000 mg | ORAL_TABLET | Freq: Every day | ORAL | Status: DC
  Administered 2016-05-03 – 2016-05-05 (×3): 10 mg via ORAL
  Filled 2016-05-03 (×3): qty 1

## 2016-05-03 MED ORDER — ALBUTEROL SULFATE (2.5 MG/3ML) 0.083% IN NEBU
2.5000 mg | INHALATION_SOLUTION | Freq: Four times a day (QID) | RESPIRATORY_TRACT | Status: DC | PRN
Start: 2016-05-03 — End: 2016-05-06

## 2016-05-03 MED ORDER — ATORVASTATIN CALCIUM 10 MG PO TABS
10.0000 mg | ORAL_TABLET | Freq: Every day | ORAL | Status: DC
  Administered 2016-05-03 – 2016-05-05 (×3): 10 mg via ORAL
  Filled 2016-05-03 (×3): qty 1

## 2016-05-03 MED ORDER — MAGNESIUM SULFATE 2 GM/50ML IV SOLN
2.0000 g | Freq: Once | INTRAVENOUS | Status: AC
  Administered 2016-05-03: 2 g via INTRAVENOUS
  Filled 2016-05-03: qty 50

## 2016-05-03 MED ORDER — PANTOPRAZOLE SODIUM 40 MG PO TBEC
40.0000 mg | DELAYED_RELEASE_TABLET | Freq: Every day | ORAL | Status: DC
  Administered 2016-05-04 – 2016-05-05 (×2): 40 mg via ORAL
  Filled 2016-05-03 (×2): qty 1

## 2016-05-03 MED ORDER — FAMOTIDINE 20 MG PO TABS
20.0000 mg | ORAL_TABLET | Freq: Every day | ORAL | Status: DC
  Administered 2016-05-03 – 2016-05-05 (×3): 20 mg via ORAL
  Filled 2016-05-03 (×3): qty 1

## 2016-05-03 MED ORDER — POTASSIUM CHLORIDE CRYS ER 20 MEQ PO TBCR
60.0000 meq | EXTENDED_RELEASE_TABLET | Freq: Once | ORAL | Status: AC
  Administered 2016-05-03: 60 meq via ORAL
  Filled 2016-05-03: qty 3

## 2016-05-03 MED ORDER — SERTRALINE HCL 100 MG PO TABS
100.0000 mg | ORAL_TABLET | Freq: Every day | ORAL | Status: DC
  Administered 2016-05-04 – 2016-05-05 (×2): 100 mg via ORAL
  Filled 2016-05-03 (×2): qty 2

## 2016-05-03 MED ORDER — SODIUM CHLORIDE 0.9 % IV SOLN
Freq: Once | INTRAVENOUS | Status: AC
  Administered 2016-05-03: 18:00:00 via INTRAVENOUS

## 2016-05-03 MED ORDER — BUDESONIDE 0.25 MG/2ML IN SUSP
0.2500 mg | Freq: Two times a day (BID) | RESPIRATORY_TRACT | Status: DC
Start: 2016-05-03 — End: 2016-05-14
  Administered 2016-05-03 – 2016-05-14 (×22): 0.25 mg via RESPIRATORY_TRACT
  Filled 2016-05-03 (×23): qty 2

## 2016-05-03 MED ORDER — POLYSACCHARIDE IRON COMPLEX 150 MG PO CAPS
150.0000 mg | ORAL_CAPSULE | Freq: Every day | ORAL | Status: DC
  Administered 2016-05-03 – 2016-05-05 (×3): 150 mg via ORAL
  Filled 2016-05-03 (×4): qty 1

## 2016-05-03 MED ORDER — SODIUM CHLORIDE 0.9% FLUSH
3.0000 mL | Freq: Two times a day (BID) | INTRAVENOUS | Status: DC
  Administered 2016-05-03 – 2016-05-07 (×6): 3 mL via INTRAVENOUS

## 2016-05-03 MED ORDER — LORATADINE 10 MG PO TABS: 10.0000 mg | ORAL_TABLET | Freq: Every day | ORAL | Status: DC | PRN

## 2016-05-03 NOTE — ED Notes (Signed)
ED Provider at bedside. 

## 2016-05-03 NOTE — H&P (Signed)
History and Physical   Shelley Pennaenny A Profit WUJ:[id number] DOB: 01/18/1966 DOA: 05/03/2016  Referring MD/NP/PA: Dr. Juleen ChinaKohut, EDP PCP: [name],[name], MD Outpatient Specialists: Dr. Elnoria HowardHung, GI  Patient coming from: Home  Chief Complaint: Rectal bleeding  HPI: Shelley [name] is a 50 y.o. female with a history of mild MR, diverticulosis, internal hemorrhoids, cecal AVM, GERD, OHS, OSA, asthma on home oxygen, and chronic anemia who [facility].   She reports BRBPR with BMs for 3 days. No constipation or diarrhea, but bright red blood follows every BM and otherwise she notes no bleeding. She has associated generalized abdominal mild pain "aching," nonradiating, waxing/waning. No medications tried. She denies palpitations, dyspnea, weakness, N/V, and fevers. She's had multiple admissions for similar presentations and reports hemorrhoid surgery performed by Dr. Elnoria HowardHung a week ago. On arrival she was in no distress, afebrile, HR 89, BP 128/57, SpO2 on 2L O2. Abdomen was benign with mild lower tenderness, no hemorrhoids or gross blood noted. Labs showed K 2.7, bicarb 41, Mg 1.5, and hgb 7.1 (from 9.7 at last check). She was given potassium, 1u PRBCs transfusion was ordered, and TRH was asked to admit. EDP contacted GI, Dr. Elnoria HowardHung for consultation.   Review of Systems: No fevers and as per HPI. All others reviewed and are negative.   Past Medical History:  Diagnosis Date  . Anxiety   . Arthritis   . Asthma   . AVM (arteriovenous malformation) of colon   . Diabetes mellitus   . Diverticulosis   . GERD (gastroesophageal reflux disease)   . GI bleed 09/2015  . Hemorrhoids    Internal and external  . Hiatal hernia   . Hyperlipidemia   . Hypertension   . Mental retardation   . Pneumonia     Past Surgical History:  Procedure Laterality Date  . CHOLECYSTECTOMY     2006  . COLONOSCOPY N/A 02/06/2013   Procedure: COLONOSCOPY;  Surgeon: Theda BelfastPatrick D Hung, MD;  Location:  [facility] ENDOSCOPY;  Service: Endoscopy;  Laterality: N/A;  . COLONOSCOPY N/A 07/31/2015   Procedure: COLONOSCOPY;  Surgeon: Jeani HawkingPatrick Hung, MD;  [facility];  Service: Endoscopy;  Laterality: N/A;  . ESOPHAGOGASTRODUODENOSCOPY N/A 02/06/2013   Procedure: ESOPHAGOGASTRODUODENOSCOPY (EGD);  Surgeon: Theda BelfastPatrick D Hung, MD;  [facility];  Service: Endoscopy;  Laterality: N/A;  . EYE SURGERY    . FLEXIBLE SIGMOIDOSCOPY N/A 09/18/2015   Procedure: FLEXIBLE SIGMOIDOSCOPY;  Surgeon: Jeani HawkingPatrick Hung, MD;  [facility];  Service: Endoscopy;  Laterality: N/A;  . HOT HEMOSTASIS N/A 07/31/2015   Procedure: HOT HEMOSTASIS (ARGON PLASMA COAGULATION/BICAP);  Surgeon: Jeani HawkingPatrick Hung, MD;  [facility];  Service: Endoscopy;  Laterality: N/A;   - Lives [place]. Nonsmoker, no EtOH or other drugs.   Allergies  Allergen Reactions  . Fish Allergy Swelling  . Pepto-Bismol [Bismuth Subsalicylate] Nausea And Vomiting  . Cortisone Itching and Rash    Injections and cream only.  PO ok.  . Trichophyton Itching    Also sneezing accompanying as well    Family History  Problem Relation Age of Onset  . COPD Mother   . Bone cancer Father    - Family history otherwise reviewed and not pertinent.  Prior to Admission medications   Medication Sig Start Date End Date Taking? Authorizing Provider  albuterol (PROVENTIL) (2.5 MG/3ML) 0.083% nebulizer solution Take 3 mLs (2.5 mg total) by nebulization every 6 (six) hours as needed for wheezing  or shortness of breath. 06/04/15  Yes Coralyn HellingVineet Sood, MD  atorvastatin (LIPITOR) 10 MG tablet Take 10 mg by mouth daily.   Yes Historical Provider, MD  budesonide (PULMICORT) 0.25 MG/2ML nebulizer solution Take 0.25 mg by nebulization 2 (two) times daily.    Yes Historical Provider, MD  famotidine (PEPCID) 20 MG tablet Take 1 tablet (20 mg total) by mouth at bedtime. 09/21/15  Yes Albertine GratesFang Xu, MD  FERREX 150 150 MG capsule Take 150 mg by  mouth daily. 07/22/15  Yes Historical Provider, MD  losartan-hydrochlorothiazide (HYZAAR) 100-12.5 MG tablet Take 1 tablet by mouth daily.  03/17/16  Yes Historical Provider, MD  metFORMIN (GLUCOPHAGE) 500 MG tablet Take 1,000 mg by mouth every evening.    Yes Historical Provider, MD  montelukast (SINGULAIR) 10 MG tablet Take 10 mg by mouth at bedtime.   Yes Historical Provider, MD  omeprazole (PRILOSEC) 40 MG capsule Take 40 mg by mouth daily.  04/04/16  Yes Historical Provider, MD  sertraline (ZOLOFT) 100 MG tablet Take 100 mg by mouth daily.  03/17/16  Yes Historical Provider, MD  loratadine (CLARITIN) 10 MG tablet Take 10 mg by mouth daily as needed for allergies.     Historical Provider, MD  losartan-hydrochlorothiazide (HYZAAR) 50-12.5 MG tablet Take 1 tablet by mouth daily. Patient not taking: Reported on 05/03/2016 09/21/15   Albertine GratesFang Xu, MD  omeprazole (PRILOSEC) 20 MG capsule Take 40 mg by mouth daily.    Historical Provider, MD  sertraline (ZOLOFT) 50 MG tablet Take 100 mg by mouth daily.     Historical Provider, MD    Physical Exam: Vitals:   05/03/16 1515 05/03/16 1530 05/03/16 1600 05/03/16 1630  BP: 128/57 146/81 139/66 137/70  Pulse: 89 94 94 86  Resp: 16 19 20 23   Temp: 98.9 F (37.2 C)     SpO2: 100% 96% 100% 98%   Constitutional: [age] y.o. female in no distress, calm demeanor Eyes: Lids and conjunctivae normal, PERRL. Flattened facial features. ENMT: Mucous membranes are moist. Posterior pharynx clear of any exudate or lesions. Fair dentition.  Neck: normal, supple, large circumference, no masses, no thyromegaly Respiratory: Non-labored breathing 2L without accessory muscle use. Bilateral end-expiratory wheezes, mild without prolongation and with good air exchange.  Cardiovascular: Regular rate and rhythm, no murmurs, rubs, or gallops. No carotid bruits. Unable to determine JVD. No LE edema. 2+ pedal pulses. Abdomen: Normoactive bowel sounds. Very mild lower abdominal tenderness  bilaterally, non-distended, and no masses palpated. No hepatosplenomegaly. GU: No indwelling catheter Musculoskeletal: No clubbing / cyanosis. No joint deformity upper and lower extremities. Good ROM, no contractures. Normal muscle tone.  Skin: Warm, dry. No rashes, wounds, no ulcers. No significant lesions noted.  Neurologic: CN II-XII grossly intact. Gait normal. Speech normal. No focal deficits in motor strength or sensation in all extremities.  Psychiatric: Alert and oriented x3. appropriate conversation with hesitancy with responses. Euthymic, congruent affect.   Labs on Admission: I have personally reviewed following labs and imaging studies  CBC:  Recent Labs Lab 05/03/16 1535  WBC 4.3  HGB 7.1*  HCT 25.4*  MCV 73.4*  PLT 277   Basic Metabolic Panel:  Recent Labs Lab 05/03/16 1535  NA 139  K 2.7*  CL 88*  CO2 41*  GLUCOSE 155*  BUN 7  CREATININE 0.45  CALCIUM 8.5*  MG 1.5*   Liver Function Tests:  Recent Labs Lab 05/03/16 1535  AST 16  ALT 11*  ALKPHOS 63  BILITOT 0.4  PROT 7.7  ALBUMIN 3.3*   Urine analysis:    Component Value Date/Time   COLORURINE AMBER (A) 03/10/2016 2320   APPEARANCEUR CLOUDY (A) 03/10/2016 2320   LABSPEC 1.021 03/10/2016 2320   PHURINE 6.5 03/10/2016 2320   GLUCOSEU NEGATIVE 03/10/2016 2320   HGBUR LARGE (A) 03/10/2016 2320   BILIRUBINUR NEGATIVE 03/10/2016 2320   KETONESUR NEGATIVE 03/10/2016 2320   PROTEINUR 30 (A) 03/10/2016 2320   NITRITE NEGATIVE 03/10/2016 2320   LEUKOCYTESUR TRACE (A) 03/10/2016 2320   Radiological Exams on Admission: No results found.  EKG: Not performed  Assessment/Plan Active Problems:   Acute lower GI bleeding   Rectal bleeding causing acute on chronic anemia: Hemodynamically stable. Multiple possible sources including diverticulosis, cecal AVM, and internal hemorrhoids. Flex sig in April impression states entirely normal, colonoscopy Feb 2017 with ?cecal AVM treated with APC, large  hemorrhoids, and nonbleeding diverticula. - NPO except for meds pending GI evaluation.  - Type & screen > 1u PRBCs now, recheck H&H post transfusion and CBC in AM. - Continue iron po - FOBT pending - Was tachycardic transiently in ED. If no arrhythmias overnight, discontinue telemetry.   Hypokalemia: Recurrent, 2.7 on admission. ?sequestration from chronic metabolic alkalosis. HCTZ likely also contributing. - Given oral potassium in ED, will add magnesium 2g IV (Mg is 1.5) and another of K.  - Check ECG - Recheck in AM. - Discontinue HCTZ and monitor BP's.  Chronic respiratory failure due to OHS, OSA, and asthma: Followed by Dr. Craige Cotta. No acute respiratory distress. - Continue 2L O2 by [place], and 4L bleed-in oxygen with nocturnal BiPAP (poor home adherence) - Continue pulmicort, albuterol q6h prn - Continue claritin, singulair  T2DM: HbA1c 6% in Sept 2017. Glucose 155 in ED. - CBG q4h w/sensitive SSI - Hold metformin - Continue lipitor - Continue ARB  GERD: Chronic, stable - Continue PPI and pepcid  Depression: Chronic, stable.  - Continue zoloft  Morbid obesity:  - Counseling briefly provided at admission.  DVT prophylaxis: SCDs  Code Status: Full, confirmed per patient (DNR listed at recent office visit note)  Family Communication: None at bedside. Called Emergency contact listed in EMR without answer. Disposition Plan: Admit for transfusion, GI work up, and electrolyte repletion. Consults called: GI, Dr. Elnoria Howard, by EDP  Admission status: Inpatient  Hazeline Junker, MD [facility] Pager [phone]  If 7PM-7AM, please contact night-coverage [email or web address] Password [facility] 05/03/2016, 5:42 PM

## 2016-05-03 NOTE — ED Triage Notes (Signed)
Per EMS pt coming from home with c/o low hemoglobin (7). Per EMS pt has hx of rectal bleeding x 1 month, and had a hemorrhoid surgery done little over week ago. She was fine after surgery, but about 3 days ago started having rectal bleeding again.

## 2016-05-03 NOTE — ED Notes (Signed)
Bed: [id number] Expected date:  Expected time:  Means of arrival:  Comments: Ems 50 yo low HGB

## 2016-05-03 NOTE — ED Provider Notes (Addendum)
[facility]-EMERGENCY DEPT Provider Note   CSN: [id number] Arrival date & time: 05/03/16  1504 By signing my name below, I, Bridgette Habermann, attest that this documentation has been prepared under the direction and in the presence of Raeford Razor, MD. Electronically Signed: Bridgette Habermann, ED Scribe. 05/03/16. 4:25 PM.  History   Chief Complaint Chief Complaint  Patient presents with  . Abnormal Lab   HPI Comments: Shelley [name] is a 50 y.o. female with h/o GERD, diverticulosis, DM and HTN who presents to the Emergency Department by EMS from home complaining of rectal bleeding onset 3 days ago. Per EMS, pt has h/o rectal bleeding for a month and had a hemorrhoid surgery done a week ago. She also reports associated minimal abdominal pain which she describes as aching.  Pt also notes that she's had her blood drawn recently and it showed she had low hemoglobin. She has not taken any OTC medications PTA. Pt denies fever, chills, nausea, or any other associated symptoms.    The history is provided by the patient. No language interpreter was used.    Past Medical History:  Diagnosis Date  . Anxiety   . Arthritis   . Asthma   . AVM (arteriovenous malformation) of colon   . Diabetes mellitus   . Diverticulosis   . GERD (gastroesophageal reflux disease)   . GI bleed 09/2015  . Hemorrhoids    Internal and external  . Hiatal hernia   . Hyperlipidemia   . Hypertension   . Mental retardation   . Pneumonia     Patient Active Problem List   Diagnosis Date Noted  . Rectal bleeding 03/11/2016  . Hyponatremia 03/11/2016  . Acute blood loss anemia   . Bleeding gastrointestinal   . Obesity hypoventilation syndrome (HCC)   . BiPAP (biphasic positive airway pressure) dependence   . Morbid obesity (HCC)   . Diabetes mellitus type 2, noninsulin dependent (HCC)   . Symptomatic anemia 07/30/2015  . Lower GI bleed 07/29/2015  . Diverticulosis 07/29/2015  . History of arteriovenous malformation (AVM)  07/29/2015  . GERD (gastroesophageal reflux disease) 07/29/2015  . Chronic diarrhea 07/29/2015  . Obesity, Class III, BMI 40-49.9 (morbid obesity) (HCC) 03/06/2013  . Internal hemorrhoids 03/06/2013  . Dependence on supplemental oxygen 03/06/2013  . Anxiety   . Mental retardation   . Iron deficiency anemia due to chronic blood loss 02/06/2013  . Hypokalemia 02/06/2013  . Diabetes mellitus (HCC) 02/06/2013  . Physical deconditioning 11/08/2012  . Developmental delay 12/02/2011  . Obstructive sleep apnea 12/02/2011  . Chronic respiratory failure (HCC) 12/02/2011  . ALLERGIC RHINITIS 08/02/2007  . Asthma, persistent controlled 08/02/2007    Past Surgical History:  Procedure Laterality Date  . CHOLECYSTECTOMY     2006  . COLONOSCOPY N/A 02/06/2013   Procedure: COLONOSCOPY;  Surgeon: Theda Belfast, MD;  Location: [facility] ENDOSCOPY;  Service: Endoscopy;  Laterality: N/A;  . COLONOSCOPY N/A 07/31/2015   Procedure: COLONOSCOPY;  Surgeon: Jeani Hawking, MD;  Location: [facility] ENDOSCOPY;  Service: Endoscopy;  Laterality: N/A;  . ESOPHAGOGASTRODUODENOSCOPY N/A 02/06/2013   Procedure: ESOPHAGOGASTRODUODENOSCOPY (EGD);  Surgeon: Theda Belfast, MD;  Location: [facility] ENDOSCOPY;  Service: Endoscopy;  Laterality: N/A;  . EYE SURGERY    . FLEXIBLE SIGMOIDOSCOPY N/A 09/18/2015   Procedure: FLEXIBLE SIGMOIDOSCOPY;  Surgeon: Jeani Hawking, MD;  Location: [facility] ENDOSCOPY;  Service: Endoscopy;  Laterality: N/A;  . HOT HEMOSTASIS N/A 07/31/2015   Procedure: HOT HEMOSTASIS (ARGON PLASMA COAGULATION/BICAP);  Surgeon: Jeani Hawking, MD;  Location:  [facility] ENDOSCOPY;  Service: Endoscopy;  Laterality: N/A;    OB History    No data available       Home Medications    Prior to Admission medications   Medication Sig Start Date End Date Taking? Authorizing Provider  albuterol (PROVENTIL) (2.5 MG/3ML) 0.083% nebulizer solution Take 3 mLs (2.5 mg total) by nebulization every 6 (six) hours as needed for wheezing or shortness of  breath. 06/04/15   [name], MD  atorvastatin (LIPITOR) 10 MG tablet Take 10 mg by mouth daily.    Historical Provider, MD  budesonide (PULMICORT) 0.25 MG/2ML nebulizer solution Take 0.25 mg by nebulization every morning.     Historical Provider, MD  famotidine (PEPCID) 20 MG tablet Take 1 tablet (20 mg total) by mouth at bedtime. 09/21/15   Albertine Grates, MD  FERREX 150 150 MG capsule Take 150 mg by mouth daily. 07/22/15   Historical Provider, MD  loratadine (CLARITIN) 10 MG tablet Take 10 mg by mouth daily as needed for allergies.     Historical Provider, MD  losartan-hydrochlorothiazide (HYZAAR) 50-12.5 MG tablet Take 1 tablet by mouth daily. 09/21/15   Albertine Grates, MD  metFORMIN (GLUCOPHAGE) 500 MG tablet Take 1,000 mg by mouth every evening.     Historical Provider, MD  montelukast (SINGULAIR) 10 MG tablet Take 10 mg by mouth at bedtime.    Historical Provider, MD  omeprazole (PRILOSEC) 20 MG capsule Take 40 mg by mouth daily.    Historical Provider, MD  sertraline (ZOLOFT) 50 MG tablet Take 100 mg by mouth daily.     Historical Provider, MD    Family History Family History  Problem Relation Age of Onset  . COPD Mother   . Bone cancer Father     Social History Social History  Substance Use Topics  . Smoking status: Never Smoker  . Smokeless tobacco: Never Used  . Alcohol use No     Allergies   Fish allergy; Pepto-bismol [bismuth subsalicylate]; Cortisone; and Trichophyton   Review of Systems Review of Systems  Constitutional: Negative for chills and fever.  Gastrointestinal: Positive for abdominal pain and rectal pain. Negative for nausea.  All other systems reviewed and are negative.    Physical Exam Updated Vital Signs BP 128/57   Pulse 89   Temp 98.9 F (37.2 C)   Resp 16   SpO2 100%   Physical Exam  Constitutional: She is oriented to person, place, and time. She appears well-developed and well-nourished. No distress.  HENT:  Head: Normocephalic and atraumatic.    Nose: Nose normal.  Mouth/Throat: Oropharynx is clear and moist. No oropharyngeal exudate.  Eyes: Conjunctivae and EOM are normal. Pupils are equal, round, and reactive to light. No scleral icterus.  Neck: Normal range of motion. Neck supple. No JVD present. No tracheal deviation present. No thyromegaly present.  Cardiovascular: Normal rate, regular rhythm and normal heart sounds.  Exam reveals no gallop and no friction rub.   No murmur heard. Pulmonary/Chest: Effort normal and breath sounds normal. No respiratory distress. She has no wheezes. She exhibits no tenderness.  Abdominal: Soft. Bowel sounds are normal. She exhibits no distension and no mass. There is tenderness. There is no rebound and no guarding.  Mild lower abdominal tenderness. No hemorrhoids or lesions noted. No gross blood. No stool in rectal vault.  Musculoskeletal: Normal range of motion. She exhibits no edema or tenderness.  Lymphadenopathy:    She has no cervical adenopathy.  Neurological: She is alert and oriented  to person, place, and time. No cranial nerve deficit. She exhibits normal muscle tone.  Skin: Skin is warm and dry. No rash noted. No erythema. No pallor.  Nursing note and vitals reviewed.    ED Treatments / Results  DIAGNOSTIC STUDIES: Oxygen Saturation is 100% on RA, normal by my interpretation.    COORDINATION OF CARE: 4:14 PM Discussed treatment plan with pt at bedside which includes blood work and pt agreed to plan.  Labs (all labs ordered are listed, but only abnormal results are displayed) Labs Reviewed  COMPREHENSIVE METABOLIC PANEL - Abnormal; Notable for the following:       Result Value   Potassium 2.7 (*)    Chloride 88 (*)    CO2 41 (*)    Glucose, Bld 155 (*)    Calcium 8.5 (*)    Albumin 3.3 (*)    ALT 11 (*)    All other components within normal limits  CBC - Abnormal; Notable for the following:    RBC 3.46 (*)    Hemoglobin 7.1 (*)    HCT 25.4 (*)    MCV 73.4 (*)    MCH  20.5 (*)    MCHC 28.0 (*)    RDW 18.4 (*)    All other components within normal limits  MAGNESIUM  POC OCCULT BLOOD, ED  TYPE AND SCREEN  PREPARE RBC (CROSSMATCH)    EKG  EKG Interpretation None       Radiology No results found.  Procedures Procedures (including critical care time)  CRITICAL CARE Performed by: Raeford RazorKOHUT, Townsend Cudworth Total critical care time: 35 minutes Critical care time was exclusive of separately billable procedures and treating other patients. Critical care was necessary to treat or prevent imminent or life-threatening deterioration. Critical care was time spent personally by me on the following activities: development of treatment plan with patient and/or surrogate as well as nursing, discussions with consultants, evaluation of patient's response to treatment, examination of patient, obtaining history from patient or surrogate, ordering and performing treatments and interventions, ordering and review of laboratory studies, ordering and review of radiographic studies, pulse oximetry and re-evaluation of patient's condition.   Medications Ordered in ED Medications - No data to display   Initial Impression / Assessment and Plan / ED Course  I have reviewed the triage vital signs and the nursing notes.  Pertinent labs & imaging results that were available during my care of the patient were reviewed by me and considered in my medical decision making (see chart for details).  Clinical Course     50yF with rectal bleeding. Hx of diverticulosis, internal hemorrhoids. Colonoscopies 01/2013 and most recent 07/2015. 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 Admitted 9/28-10/06/2015 with what sounds like similar picture felt to be from internal hemorrhoids? She did not tolerate previous outpt banding attempt. GI physician is Dr. Elnoria HowardHung.   Chronic anemia for the past several years with a baseline ~8. 7.1 today from 9.7 on 03/13/2016. No gross blood on my exam but she reports  blood per rectum several times since Friday. Resting HR around 100. No hypotension. Will transfuse 1u PRBC. I think she needs admitted at least for observation/tranding h/h. Supplement hypokalemia. Check magnesium.   5:56 PM Discussed with Dr Elnoria HowardHung, GI. He is available to see her if needed, but he is not sure there is much more he can add to her care at this point. He thinks the next step would be possible general surgical evaluation for her hemorrhoids because she is such  high risk with sedation.   Final Clinical Impressions(s) / ED Diagnoses   Final diagnoses:  Rectal bleeding  Anemia, unspecified type  Hypokalemia    New Prescriptions New Prescriptions   No medications on file    I personally preformed the services scribed in my presence. The recorded information has been reviewed is accurate. Raeford RazorStephen Shiasia Porro, MD.     Raeford RazorStephen Taygen Newsome, MD 05/03/16 1658    Raeford RazorStephen Gust Eugene, MD 05/03/16 [phone]

## 2016-05-03 NOTE — ED Notes (Signed)
Pt helped to bedside commode

## 2016-05-04 LAB — BASIC METABOLIC PANEL
ANION GAP: 6 (ref 5–15)
BUN: 6 mg/dL (ref 6–20)
CALCIUM: 8.3 mg/dL — AB (ref 8.9–10.3)
CO2: 46 mmol/L — ABNORMAL HIGH (ref 22–32)
CREATININE: 0.46 mg/dL (ref 0.44–1.00)
Chloride: 88 mmol/L — ABNORMAL LOW (ref 101–111)
GFR calc Af Amer: >60 mL/min (ref >60)
GLUCOSE: 191 mg/dL — AB (ref 65–99)
Potassium: 3.4 mmol/L — ABNORMAL LOW (ref 3.5–5.1)
Sodium: 140 mmol/L (ref 135–145)

## 2016-05-04 LAB — CBC
HCT: 27.1 % — ABNORMAL LOW (ref 36.0–46.0)
Hemoglobin: 7.6 g/dL — ABNORMAL LOW (ref 12.0–15.0)
MCH: 21.7 pg — AB (ref 26.0–34.0)
MCHC: 28 g/dL — AB (ref 30.0–36.0)
MCV: 77.2 fL — ABNORMAL LOW (ref 78.0–100.0)
PLATELETS: 251 10*3/uL (ref 150–400)
RBC: 3.51 MIL/uL — ABNORMAL LOW (ref 3.87–5.11)
RDW: 18.9 % — AB (ref 11.5–15.5)
WBC: 4 10*3/uL (ref 4.0–10.5)

## 2016-05-04 LAB — PREPARE RBC (CROSSMATCH)

## 2016-05-04 LAB — OCCULT BLOOD, POC DEVICE: FECAL OCCULT BLD: POSITIVE — AB

## 2016-05-04 MED ORDER — GUAIFENESIN-DM 100-10 MG/5ML PO SYRP
5.0000 mL | ORAL_SOLUTION | ORAL | Status: DC | PRN
  Administered 2016-05-04 – 2016-05-05 (×3): 5 mL via ORAL
  Filled 2016-05-04 (×3): qty 10

## 2016-05-04 MED ORDER — SODIUM CHLORIDE 0.9 % IV SOLN: Freq: Once | INTRAVENOUS | Status: DC

## 2016-05-04 MED ORDER — FUROSEMIDE 10 MG/ML IJ SOLN
20.0000 mg | Freq: Once | INTRAMUSCULAR | Status: AC
  Administered 2016-05-04: 20 mg via INTRAVENOUS
  Filled 2016-05-04: qty 2

## 2016-05-04 NOTE — Progress Notes (Signed)
[facility] PROGRESS NOTE  Shelley [name] A [name] ZOX:[id number] DOB: 11/09/1965 DOA: 05/03/2016 PCP: [name],[name], MD  Interim summary and HPI 50 y.o. female with a history of mild MR, diverticulosis, internal hemorrhoids, cecal AVM, GERD, OHS, OSA, asthma on home oxygen, and chronic anemia who [facility]   Assessment/Plan: Rectal bleeding causing acute on chronic anemia: Hemodynamically stable. Multiple possible sources including diverticulosis, cecal AVM, and internal hemorrhoids. Flex sig in April impression states entirely normal, colonoscopy Feb 2017 with ?cecal AVM treated with APC, large hemorrhoids, and nonbleeding diverticula. -full liquid diet -will follow GI rec's -will follow Hgb trend -will transfuse another unit of PRBC   Hypokalemia: Recurrent, 2.7 on admission. ?sequestration from chronic metabolic alkalosis. HCTZ likely also contributing. -will replete and monitor trend   Chronic respiratory failure due to OHS, OSA, and asthma: Followed by Dr. Craige CottaSood. No acute respiratory distress. - Continue 2L O2 by , and 4L bleed-in oxygen with nocturnal BiPAP (poor home adherence) - Continue pulmicort, albuterol q6h prn - Continue claritin, singulair  T2DM: HbA1c 6% in Sept 2017. Glucose 155 in ED. - CBG q4h w/sensitive SSI - Hold metformin while inpatient  HLD - Continue lipitor  HTN - Continue ARB  GERD: Chronic, stable - Continue PPI and pepcid  Depression: Chronic, stable.  - Continue zoloft -no hallucinations or SI  Morbid obesity:  - Counseling about low calorie diet and exercise   Code Status: Full Family Communication: none at bedside  Disposition Plan: home    Consultants:  GI  Procedures:  See below for x-ray reports   Antibiotics:  None  HPI/Subjective: Afebrile, no CP, no SOB. Denies any further bleeding. Still anemic.  Objective: Vitals:   05/04/16 1500 05/04/16 2049  BP: 140/86  (!) 128/59  Pulse: 79 92  Resp: 18 18  Temp: 99.1 F (37.3 C) 99.5 F (37.5 C)    Intake/Output Summary (Last 24 hours) at 05/04/16 2145 Last data filed at 05/04/16 2129  Gross per 24 hour  Intake              388 ml  Output                0 ml  Net              388 ml   Filed Weights   05/03/16 1905  Weight: 107 kg (235 lb 14.3 oz)    Exam:   General:  Afebrile, no CP, no SOB. Denies any further bleeding   Cardiovascular: S1 and S2, no rubs, no gallops  Respiratory: CTA bilaterally  Abdomen: obese, non-tender, positive BS  Musculoskeletal: no edema, no cyanosis   Data Reviewed: Basic Metabolic Panel:  Recent Labs Lab 05/03/16 1535 05/04/16 0335  NA 139 140  K 2.7* 3.4*  CL 88* 88*  CO2 41* 46*  GLUCOSE 155* 191*  BUN 7 6  CREATININE 0.45 0.46  CALCIUM 8.5* 8.3*  MG 1.5*  --    Liver Function Tests:  Recent Labs Lab 05/03/16 1535  AST 16  ALT 11*  ALKPHOS 63  BILITOT 0.4  PROT 7.7  ALBUMIN 3.3*   CBC:  Recent Labs Lab 05/03/16 1535 05/03/16 2258 05/04/16 0335  WBC 4.3  --  4.0  HGB 7.1* 7.8* 7.6*  HCT 25.4* 27.8* 27.1*  MCV 73.4*  --  77.2*  PLT 277  --  251    Studies: No results found.  Scheduled Meds: . sodium chloride  Intravenous Once  . atorvastatin  10 mg Oral q1800  . budesonide  0.25 mg Nebulization BID  . famotidine  20 mg Oral QHS  . iron polysaccharides  150 mg Oral Daily  . montelukast  10 mg Oral QHS  . pantoprazole  40 mg Oral Daily  . sertraline  100 mg Oral Daily  . sodium chloride flush  3 mL Intravenous Q12H   Continuous Infusions:  Principal Problem:   Acute lower GI bleeding Active Problems:   Asthma, persistent controlled   Obstructive sleep apnea   Chronic respiratory failure (HCC)   Iron deficiency anemia due to chronic blood loss   Hypokalemia   Diabetes mellitus (HCC)   Obesity, Class III, BMI 40-49.9 (morbid obesity) (HCC)   Internal hemorrhoids   Mental retardation   History of  arteriovenous malformation (AVM)   GERD (gastroesophageal reflux disease)   Acute blood loss anemia   Obesity hypoventilation syndrome (HCC)   Morbid obesity (HCC)    Time spent: 30 minutes    Vassie LollMadera, Carmen Tolliver  [facility] Pager [phone]. If 7PM-7AM, please contact night-coverage at [email or web address], password [email or web address] 05/04/2016, 9:45 PM  LOS: 1 day

## 2016-05-04 NOTE — Progress Notes (Signed)
[facility] from home [facility]. She also has chronic home 02. [facility]. [facility] rep alerted of pt admission. Sandford Crazeora Toini Failla RN,BSN,NCM [phone]

## 2016-05-04 NOTE — Progress Notes (Signed)
Inpatient Diabetes Program Recommendations  AACE/ADA: New Consensus Statement on Inpatient Glycemic Control (2015)  Target Ranges:  Prepandial:   less than 140 mg/dL      Peak postprandial:   less than 180 mg/dL (1-2 hours)      Critically ill patients:  140 - 180 mg/dL   Results for Shelley [name], Shelley [name] (MRN [id number]) as of 05/04/2016 09:06  Ref. Range 05/03/2016 15:35 05/04/2016 03:35  Glucose Latest Ref Range: 65 - 99 mg/dL 409155 (H) 811191 (H)    Admit with: Rectal Bleeding  History: DM2  Home DM Meds: Metformin 1000 mg QPM  Current Insulin Orders: None      MD- Please consider starting Novolog Sensitive Correction Scale/ SSI (0-9 units) Q4 hours     --Will follow patient during hospitalization--  Ambrose FinlandJeannine Johnston Gustava Berland RN, MSN, CDE Diabetes Coordinator Inpatient Glycemic Control Team Team Pager: [phone] (8a-5p)

## 2016-05-04 NOTE — Progress Notes (Signed)
Pt from home and uses [facility] for HHRN services. She also has chronic home 02. WIll need resumption orders for HHRN at discharge. [facility] rep alerted of pt admission. Trig Mcbryar RN,BSN,NCM [phone] 

## 2016-05-04 NOTE — Plan of Care (Signed)
Problem: Safety: Goal: Ability to remain free from injury will improve Outcome: Completed/Met Date Met: 05/04/16 Bed alarm , safety prevention plan reviewed. Pt agreeable

## 2016-05-05 ENCOUNTER — Encounter (HOSPITAL_COMMUNITY): Payer: Self-pay | Admitting: Anesthesiology

## 2016-05-05 DIAGNOSIS — K5909 Other constipation: Secondary | ICD-10-CM | POA: Diagnosis present

## 2016-05-05 DIAGNOSIS — K5520 Angiodysplasia of colon without hemorrhage: Secondary | ICD-10-CM

## 2016-05-05 DIAGNOSIS — K922 Gastrointestinal hemorrhage, unspecified: Secondary | ICD-10-CM

## 2016-05-05 LAB — TYPE AND SCREEN
ABO/RH(D): A POS
Antibody Screen: NEGATIVE
Unit division: 0
Unit division: 0

## 2016-05-05 LAB — CBC
HCT: 29.5 % — ABNORMAL LOW (ref 36.0–46.0)
Hemoglobin: 8.9 g/dL — ABNORMAL LOW (ref 12.0–15.0)
MCH: 23.8 pg — ABNORMAL LOW (ref 26.0–34.0)
MCHC: 30.2 g/dL (ref 30.0–36.0)
MCV: 78.9 fL (ref 78.0–100.0)
PLATELETS: 242 10*3/uL (ref 150–400)
RBC: 3.74 MIL/uL — AB (ref 3.87–5.11)
RDW: 20.1 % — AB (ref 11.5–15.5)
WBC: 3.3 10*3/uL — AB (ref 4.0–10.5)

## 2016-05-05 MED ORDER — WITCH HAZEL-GLYCERIN EX PADS
1.0000 "application " | MEDICATED_PAD | CUTANEOUS | Status: DC | PRN
  Filled 2016-05-05: qty 100

## 2016-05-05 MED ORDER — GABAPENTIN 300 MG PO CAPS: 300.0000 mg | ORAL_CAPSULE | ORAL | Status: DC

## 2016-05-05 MED ORDER — PROCHLORPERAZINE EDISYLATE 5 MG/ML IJ SOLN: 5.0000 mg | INTRAMUSCULAR | Status: DC | PRN

## 2016-05-05 MED ORDER — CHLORHEXIDINE GLUCONATE CLOTH 2 % EX PADS
6.0000 | MEDICATED_PAD | Freq: Once | CUTANEOUS | Status: AC
  Administered 2016-05-05: 6 via TOPICAL

## 2016-05-05 MED ORDER — ACETAMINOPHEN 500 MG PO TABS: 1000.0000 mg | ORAL_TABLET | ORAL | Status: DC

## 2016-05-05 MED ORDER — POLYETHYLENE GLYCOL 3350 17 G PO PACK: 17.0000 g | PACK | Freq: Every day | ORAL | Status: DC

## 2016-05-05 MED ORDER — PHENOL 1.4 % MT LIQD
2.0000 | OROMUCOSAL | Status: DC | PRN
  Filled 2016-05-05: qty 177

## 2016-05-05 MED ORDER — LIP MEDEX EX OINT
1.0000 "application " | TOPICAL_OINTMENT | Freq: Two times a day (BID) | CUTANEOUS | Status: DC
  Administered 2016-05-06 – 2016-05-14 (×14): 1 via TOPICAL
  Filled 2016-05-05 (×4): qty 7

## 2016-05-05 MED ORDER — MAGIC MOUTHWASH
15.0000 mL | Freq: Four times a day (QID) | ORAL | Status: DC | PRN
  Filled 2016-05-05: qty 15

## 2016-05-05 MED ORDER — POLYETHYLENE GLYCOL 3350 17 G PO PACK
34.0000 g | PACK | Freq: Two times a day (BID) | ORAL | Status: DC
  Administered 2016-05-05: 34 g via ORAL

## 2016-05-05 MED ORDER — MENTHOL 3 MG MT LOZG
1.0000 | LOZENGE | OROMUCOSAL | Status: DC | PRN
Start: 2016-05-05 — End: 2016-05-06
  Filled 2016-05-05: qty 9

## 2016-05-05 MED ORDER — HYDROCORTISONE ACE-PRAMOXINE 2.5-1 % RE CREA
1.0000 "application " | TOPICAL_CREAM | Freq: Four times a day (QID) | RECTAL | Status: DC | PRN
  Administered 2016-05-09: 1 via RECTAL
  Filled 2016-05-05: qty 30

## 2016-05-05 MED ORDER — CHLORHEXIDINE GLUCONATE CLOTH 2 % EX PADS
6.0000 | MEDICATED_PAD | Freq: Once | CUTANEOUS | Status: AC
  Administered 2016-05-06: 6 via TOPICAL

## 2016-05-05 MED ORDER — ALUM & MAG HYDROXIDE-SIMETH 200-200-20 MG/5ML PO SUSP: 30.0000 mL | Freq: Four times a day (QID) | ORAL | Status: DC | PRN

## 2016-05-05 MED ORDER — GENTAMICIN SULFATE 40 MG/ML IJ SOLN
5.0000 mg/kg | INTRAVENOUS | Status: AC
  Administered 2016-05-06: 535.2 mg via INTRAVENOUS
  Filled 2016-05-05 (×2): qty 13.5

## 2016-05-05 MED ORDER — BUPIVACAINE LIPOSOME 1.3 % IJ SUSP
20.0000 mL | INTRAMUSCULAR | Status: AC
  Filled 2016-05-05 (×3): qty 20

## 2016-05-05 MED ORDER — POLYETHYLENE GLYCOL 3350 17 G PO PACK: 17.0000 g | PACK | Freq: Two times a day (BID) | ORAL | Status: DC

## 2016-05-05 MED ORDER — CLINDAMYCIN PHOSPHATE 900 MG/50ML IV SOLN
900.0000 mg | INTRAVENOUS | Status: AC
  Administered 2016-05-06: 900 mg via INTRAVENOUS

## 2016-05-05 NOTE — Anesthesia Preprocedure Evaluation (Addendum)
Anesthesia Evaluation  Patient identified by MRN, date of birth, ID band Patient awake    Reviewed: Allergy & Precautions, NPO status , Patient's Chart, lab work & pertinent test results  Airway Mallampati: IV  TM Distance: >3 FB Neck ROM: Full    Dental no notable dental hx. (+) Teeth Intact   Pulmonary asthma , sleep apnea and Continuous Positive Airway Pressure Ventilation , pneumonia, resolved,    Pulmonary exam normal breath sounds clear to auscultation       Cardiovascular hypertension, Pt. on medications Normal cardiovascular exam Rhythm:Regular Rate:Normal     Neuro/Psych PSYCHIATRIC DISORDERS Anxiety Mental retardation Neuromuscular disease    GI/Hepatic hiatal hernia, GERD  Medicated and Controlled,AVM of lower G.I. Tract Cute lower G.I. bleed hemorrhoids   Endo/Other  diabetes, Well Controlled, Type 2, Oral Hypoglycemic AgentsMorbid obesity  Renal/GU   negative genitourinary   Musculoskeletal  (+) Arthritis ,   Abdominal (+) + obese,   Peds  Hematology  (+) anemia ,   Anesthesia Other Findings   Reproductive/Obstetrics                              Chemistry      Component Value Date/Time   NA 140 05/04/2016 0335   K 3.4 (L) 05/04/2016 0335   CL 88 (L) 05/04/2016 0335   CO2 46 (H) 05/04/2016 0335   BUN 6 05/04/2016 0335   CREATININE 0.46 05/04/2016 0335      Component Value Date/Time   CALCIUM 8.3 (L) 05/04/2016 0335   ALKPHOS 63 05/03/2016 1535   AST 16 05/03/2016 1535   ALT 11 (L) 05/03/2016 1535   BILITOT 0.4 05/03/2016 1535     Lab Results  Component Value Date   WBC 4.7 05/06/2016   HGB 8.9 (L) 05/06/2016   HCT 32.6 (L) 05/06/2016   MCV 82.1 05/06/2016   PLT 254 05/06/2016   EKG: normal sinus rhythm, RVH.  Anesthesia Physical Anesthesia Plan  ASA: III  Anesthesia Plan: General   Post-op Pain Management:    Induction: Intravenous, Rapid sequence and  Cricoid pressure planned  Airway Management Planned: Oral ETT  Additional Equipment:   Intra-op Plan:   Post-operative Plan: Extubation in OR  Informed Consent: I have reviewed the patients History and Physical, chart, labs and discussed the procedure including the risks, benefits and alternatives for the proposed anesthesia with the patient or authorized representative who has indicated his/her understanding and acceptance.   Dental advisory given  Plan Discussed with: CRNA, Anesthesiologist and Surgeon  Anesthesia Plan Comments:        Anesthesia Quick Evaluation

## 2016-05-05 NOTE — Progress Notes (Signed)
Patient had a large dark green stool with moderate amount of bright red blood. hospitalist contacted. Awaiting surgery consult

## 2016-05-05 NOTE — Consult Note (Signed)
Name: Shelley [name] MRN: [id number] DOB: 07/20/1965    ADMISSION DATE:  05/03/2016 CONSULTATION DATE:  05/05/16  REFERRING MD :  Gwenlyn PerkingMadera  CHIEF COMPLAINT:  Rectal bleed  BRIEF PATIENT DESCRIPTION: 50 y.o.female never smokerwith a history of mild MR, diverticulosis, internal hemorrhoids, cecal AVM, GERD, OHS, OSA, asthma on home oxygen, and chronic anemia who [facility]. PCCM asked for pulmonary clearance.   SIGNIFICANT EVENTS    STUDIES:     HISTORY OF PRESENT ILLNESS:  50 y.o.female never smokerwith a history of mild MR, diverticulosis, internal hemorrhoids, cecal AVM, GERD, OHS, OSA, asthma on home oxygen, and chronic anemia who [facility] PCCM asked for pulmonary clearance. She admits a little cough but considers her breathing at baseline.  LOV with Dr Craige CottaSood for pulmonary and sleep medicine f/u 11/14. She was educated to be compliant with her BIPAP 17/11 with O2 4L for sleep and 2L while awake. Uses her nebulizer albuterol twice daily, neb budesonide once daily, and singulair. CXR 9/29- NAD Now looking at surgery for lower GI bleed- possibly internal hemorrhoids.  PAST MEDICAL HISTORY :   has a past medical history of Anxiety; Arthritis; Asthma; AVM (arteriovenous malformation) of colon; Diabetes mellitus; Diverticulosis; GERD (gastroesophageal reflux disease); GI bleed (09/2015); Hemorrhoids; Hiatal hernia; Hyperlipidemia; Hypertension; Mental retardation; and Pneumonia.  has a past surgical history that includes Cholecystectomy; Eye surgery; Colonoscopy (N/A, 02/06/2013); Esophagogastroduodenoscopy (N/A, 02/06/2013); Colonoscopy (N/A, 07/31/2015); Hot hemostasis (N/A, 07/31/2015); and Flexible sigmoidoscopy (N/A, 09/18/2015). Prior to Admission medications   Medication Sig Start Date End Date Taking? Authorizing Provider  albuterol (PROVENTIL) (2.5 MG/3ML) 0.083% nebulizer solution Take 3 mLs (2.5 mg  total) by nebulization every 6 (six) hours as needed for wheezing or shortness of breath. 06/04/15  Yes Coralyn HellingVineet Sood, MD  atorvastatin (LIPITOR) 10 MG tablet Take 10 mg by mouth daily.   Yes Historical Provider, MD  budesonide (PULMICORT) 0.25 MG/2ML nebulizer solution Take 0.25 mg by nebulization 2 (two) times daily.    Yes Historical Provider, MD  famotidine (PEPCID) 20 MG tablet Take 1 tablet (20 mg total) by mouth at bedtime. 09/21/15  Yes Albertine GratesFang Xu, MD  FERREX 150 150 MG capsule Take 150 mg by mouth daily. 07/22/15  Yes Historical Provider, MD  losartan-hydrochlorothiazide (HYZAAR) 100-12.5 MG tablet Take 1 tablet by mouth daily.  03/17/16  Yes Historical Provider, MD  metFORMIN (GLUCOPHAGE) 500 MG tablet Take 1,000 mg by mouth every evening.    Yes Historical Provider, MD  montelukast (SINGULAIR) 10 MG tablet Take 10 mg by mouth at bedtime.   Yes Historical Provider, MD  omeprazole (PRILOSEC) 40 MG capsule Take 40 mg by mouth daily.  04/04/16  Yes Historical Provider, MD  sertraline (ZOLOFT) 100 MG tablet Take 100 mg by mouth daily.  03/17/16  Yes Historical Provider, MD  loratadine (CLARITIN) 10 MG tablet Take 10 mg by mouth daily as needed for allergies.     Historical Provider, MD  losartan-hydrochlorothiazide (HYZAAR) 50-12.5 MG tablet Take 1 tablet by mouth daily. Patient not taking: Reported on 05/03/2016 09/21/15   Albertine GratesFang Xu, MD  omeprazole (PRILOSEC) 20 MG capsule Take 40 mg by mouth daily.    Historical Provider, MD  sertraline (ZOLOFT) 50 MG tablet Take 100 mg by mouth daily.     Historical Provider, MD   Allergies  Allergen Reactions  . Fish Allergy Swelling  . Pepto-Bismol [Bismuth Subsalicylate] Nausea And Vomiting  .  Cortisone Itching and Rash    Injections and cream only.  PO ok.  . Trichophyton Itching    Also sneezing accompanying as well    FAMILY HISTORY:  family history includes Bone cancer in her father; COPD in her mother. SOCIAL HISTORY:  reports that she has never  smoked. She has never used smokeless tobacco. She reports that she does not drink alcohol or use drugs.  REVIEW OF SYSTEMS:  + = pos Constitutional: Negative for fever, chills, weight loss, malaise/fatigue and diaphoresis.  HENT: Negative for hearing loss, ear pain, nosebleeds, congestion, sore throat, neck pain, tinnitus and ear discharge.   Eyes: Negative for blurred vision, double vision, photophobia, pain, discharge and redness.  Respiratory: Negative for cough, hemoptysis, sputum production, +shortness of breath, wheezing and stridor.   Cardiovascular: Negative for chest pain, palpitations, orthopnea, claudication, leg swelling and PND.  Gastrointestinal: Negative for heartburn, nausea, vomiting, abdominal pain, diarrhea, constipation,+ blood in stool and melena.  Genitourinary: Negative for dysuria, urgency, frequency, hematuria and flank pain.  Musculoskeletal: Negative for myalgias, back pain, joint pain and falls.  Skin: Negative for itching and rash.  Neurological: Negative for dizziness, tingling, tremors, sensory change, speech change, focal weakness, seizures, loss of consciousness, weakness and headaches.  Endo/Heme/Allergies: Negative for environmental allergies and polydipsia. Does not bruise/bleed easily.  SUBJECTIVE:   VITAL SIGNS: Temp:  [99.1 F (37.3 C)-99.5 F (37.5 C)] 99.2 F (37.3 C) (11/23 1447) Pulse Rate:  [80-95] 80 (11/23 1447) Resp:  [17-18] 18 (11/23 1447) BP: (118-135)/(59-71) 135/71 (11/23 1447) SpO2:  [97 %-100 %] 99 % (11/23 1447)  PHYSICAL EXAMINATION: General:  Child-like affect, lying in bed, NAD Neuro:  Oriented, cooperative, non-focal HEENT:  Speech clear, no stridor, mucosa normal Cardiovascular:   Rrr, no m/g/r Lungs:  Slight dry  Cough, no wheeze, unlabored on O2 Abdomen:  Softly obese, non-tender Musculoskeletal:  Weak/ flabby tone, moves all extremities Skin: no rash or bruising   Recent Labs Lab 05/03/16 1535 05/04/16 0335  NA  139 140  K 2.7* 3.4*  CL 88* 88*  CO2 41* 46*  BUN 7 6  CREATININE 0.45 0.46  GLUCOSE 155* 191*    Recent Labs Lab 05/03/16 1535 05/03/16 2258 05/04/16 0335 05/05/16 0830  HGB 7.1* 7.8* 7.6* 8.9*  HCT 25.4* 27.8* 27.1* 29.5*  WBC 4.3  --  4.0 3.3*  PLT 277  --  251 242   No results found.  ASSESSMENT / PLAN:  Obesity -hypoventilation- baseline.  Obstructive sleep apnea- Both these problems will be managed by using BIPAP/ 4L while asleep, close attention while sedated. O2 2L or as needed to keep O2 sat 9-94%. Avoid Higher sats to minimize CO2 narcosis.  Anemia- GI bleed- correction will be important to maintain O2 transport  Asthma mild persistent- continue present bronchodilators. Systemic steroids not currently needed.   CD Maple HudsonYoung, MD Pulmonary and Critical Care Medicine [facility] Pager: [phone]  05/05/2016, 4:57 PM

## 2016-05-05 NOTE — Plan of Care (Signed)
Problem: Physical Regulation: Goal: Ability to maintain clinical measurements within normal limits will improve Pt denies

## 2016-05-05 NOTE — Progress Notes (Signed)
[facility] PROGRESS NOTE  Shelley [name] AOZ:[id number] DOB: 10/28/1965 DOA: 05/03/2016 PCP: Shelley [name]  Interim summary and HPI 50 y.o. female with a history of mild MR, diverticulosis, internal hemorrhoids, cecal AVM, GERD, OHS, OSA, asthma on home oxygen, and chronic anemia who [facility].   Assessment/Plan: Rectal bleeding causing acute on chronic anemia: Hemodynamically stable. Multiple possible sources including diverticulosis, cecal AVM, and internal hemorrhoids. Flex sig in April impression states entirely normal, colonoscopy Feb 2017 with ?cecal AVM treated with APC, large hemorrhoids, and nonbleeding diverticula. -tolerating modified carb diet  -will following GI rec's, surgery consulted for evaluation and hopefully intervention to help with bleeding (per Shelley [name] most likely bleeding from internal hemorrhoids) -will follow Hgb trend and transfuse as needed -has received 2 units of PRBC's and current Hgb level is 8.9 -started on miralax to decrease friction of stools on her hemorrhoids    Hypokalemia: Recurrent, 2.7 on admission. ?sequestration from chronic metabolic alkalosis (which is chronic). HCTZ likely also contributing. -will replete and monitor trend   Chronic respiratory failure due to OHS, OSA, and asthma: Followed by Shelley [name]. No acute respiratory distress. - Continue 2-3L O2 by [place], and 4L bleed-in oxygen with nocturnal CPAP (poor home adherence) - Continue pulmicort, albuterol q6h prn - Continue claritin, singulair -no wheezing currently -pulmonary service consulted for pre-operative clearance   T2DM: HbA1c 6% in Sept 2017. Glucose 155 in ED. - CBG q4h w/sensitive SSI - Hold metformin while inpatient  HLD - Continue lipitor  HTN - Continue ARB  GERD: Chronic, stable - Continue PPI and pepcid  Depression: Chronic, stable.  - Continue zoloft -no hallucinations or SI  Morbid obesity:  -  Counseling about low calorie diet and exercise   Code Status: Full Family Communication: none at bedside  Disposition Plan: home when medically stable    Consultants:  GI  CCS  Pulmonary (for clearance)  Procedures:  See below for x-ray reports   Antibiotics:  None  HPI/Subjective: Afebrile, no CP, no SOB. Denies abd pain; but experienced another moderate amount of BRB episode.  Objective: Vitals:   05/05/16 0826 05/05/16 1447  BP:  135/71  Pulse: 95 80  Resp: 17 18  Temp:  99.2 F (37.3 C)    Intake/Output Summary (Last 24 hours) at 05/05/16 1525 Last data filed at 05/04/16 2129  Gross per 24 hour  Intake                3 ml  Output                0 ml  Net                3 ml   Filed Weights   05/03/16 1905  Weight: 107 kg (235 lb 14.3 oz)    Exam:   General:  Afebrile, no CP, no SOB. Patient with moderate bright red blood per rectum today; no abd pain.    Cardiovascular: S1 and S2, no rubs, no gallops  Respiratory: CTA bilaterally  Abdomen: obese, non-tender, positive BS  Musculoskeletal: no edema, no cyanosis   Data Reviewed: Basic Metabolic Panel:  Recent Labs Lab 05/03/16 1535 05/04/16 0335  NA 139 140  K 2.7* 3.4*  CL 88* 88*  CO2 41* 46*  GLUCOSE 155* 191*  BUN 7 6  CREATININE 0.45 0.46  CALCIUM 8.5* 8.3*  MG 1.5*  --    Liver Function Tests:  Recent Labs Lab 05/03/16 1535  AST 16  ALT 11*  ALKPHOS 63  BILITOT 0.4  PROT 7.7  ALBUMIN 3.3*   CBC:  Recent Labs Lab 05/03/16 1535 05/03/16 2258 05/04/16 0335 05/05/16 0830  WBC 4.3  --  4.0 3.3*  HGB 7.1* 7.8* 7.6* 8.9*  HCT 25.4* 27.8* 27.1* 29.5*  MCV 73.4*  --  77.2* 78.9  PLT 277  --  251 242    Studies: No results found.  Scheduled Meds: . sodium chloride   Intravenous Once  . atorvastatin  10 mg Oral q1800  . budesonide  0.25 mg Nebulization BID  . famotidine  20 mg Oral QHS  . iron polysaccharides  150 mg Oral Daily  . montelukast  10 mg Oral QHS   . pantoprazole  40 mg Oral Daily  . polyethylene glycol  17 g Oral Daily  . sertraline  100 mg Oral Daily  . sodium chloride flush  3 mL Intravenous Q12H   Continuous Infusions:  Principal Problem:   Acute lower GI bleeding Active Problems:   Asthma, persistent controlled   Obstructive sleep apnea   Chronic respiratory failure (HCC)   Iron deficiency anemia due to chronic blood loss   Hypokalemia   Diabetes mellitus (HCC)   Obesity, Class III, BMI 40-49.9 (morbid obesity) (HCC)   Internal hemorrhoids   Mental retardation   History of arteriovenous malformation (AVM)   GERD (gastroesophageal reflux disease)   Acute blood loss anemia   Obesity hypoventilation syndrome (HCC)   Morbid obesity (HCC)    Time spent: 30 minutes    Shelley [name]  [facility] Pager [phone]. If 7PM-7AM, please contact night-coverage at [email or web address], password [email or web address] 05/05/2016, 3:25 PM  LOS: 2 days

## 2016-05-05 NOTE — Consult Note (Signed)
[address] [place], [place] [address] Phone: [phone] FAX: [phone]     Shelley [name]  01-30-1966 [id number]  CARE TEAM:  PCP: Osborne Casco, MD  Outpatient Care Team: Patient Care Team: Kelton Pillar, MD as PCP - General (Family Medicine) Carol Ada, MD as Consulting Physician (Gastroenterology) Michael Boston, MD as Consulting Physician (General Surgery) Chesley Mires, MD as Consulting Physician (Pulmonary Disease)  Inpatient Treatment Team: Treatment Team: Attending Provider: Barton Dubois, MD; Technician: Lauro Regulus, NT; Registered Nurse: Hermina Staggers, RN; Rounding Team: Garner Gavel, MD; Technician: Jani Files, NT; Technician: Emmaline Kluver, NT; Registered Nurse: Audie Box, RN; Consulting Physician: Md Pccm, MD; Rounding Team: Md Pccm, MD; Consulting Physician: Nolon Nations, MD  This patient is a 50 y.o.female who presents today for surgical evaluation at the request of Dr Barton Dubois, [place].   Reason for evaluation: Recurrent rectal bleeding with symptomatic anemia  Pleasant morbidly obese lady with cognitive developmental delay.  Oxygen dependent hypoventilation syndrome.  Has struggled with intermittent episodes of severe rectal bleeding for the past few years.  Has had prior colonoscopies.  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  Persistent bleeding.   The patient has seen our surgical group intermittently for the past few years.  I did evaluation and banding in 2013.  I was concern with bleeding hemorrhoids that it may come to surgery to to treat.  It was felt she was not a good operative candidate since she is oxygen dependent.  Lost to follow-up.    Then had severe rectal bleeding in April 2017.  Hgb 5.4   Transfused.  Had colonoscopy.  The prep was not great but no proximal bleeding.  Dr. Benson Norway with gastroenterology wondered if the  bleeding was more at the internal hemorrhoids.  She stabilized.  Recommendation for outpatient follow-up.  Followed up with my partner, Dr. Gurney Maxin.  He is done hemorrhoidal bandings in the office twice this year.  It seemed to calm down.  However patient had another severe episode of rectal bleeding.  Symptomatic anemia.  Readmitted.  Initially seemed to calm down.  Gastrology was called.  Apparently they were skeptical of a more proximal etiology.  Patient had another severe episode of rectal bleeding requiring transfusion.  Surgical consultation requested to see if anything could be done.  Patient is here today by herself.  She is anxious but consolable.  After discussing with her she wished me to talk to her close friend and caregiver, Sharlene Motts.  [phone]  Past Medical History:  Diagnosis Date  . Anxiety   . Arthritis   . Asthma   . AVM (arteriovenous malformation) of colon   . Diabetes mellitus   . Diverticulosis   . GERD (gastroesophageal reflux disease)   . GI bleed 09/2015  . Hemorrhoids    Internal and external  . Hiatal hernia   . Hyperlipidemia   . Hypertension   . Mental retardation   . Pneumonia     Past Surgical History:  Procedure Laterality Date  . CHOLECYSTECTOMY     2006  . COLONOSCOPY N/A 02/06/2013   Procedure: COLONOSCOPY;  Surgeon: Beryle Beams, MD;  Location: [facility];  Service: Endoscopy;  Laterality: N/A;  . COLONOSCOPY N/A 07/31/2015   Procedure: COLONOSCOPY;  Surgeon: Carol Ada, MD;  Location: [facility];  Service: Endoscopy;  Laterality: N/A;  . ESOPHAGOGASTRODUODENOSCOPY N/A  02/06/2013   Procedure: ESOPHAGOGASTRODUODENOSCOPY (EGD);  Surgeon: Beryle Beams, MD;  Location: [facility];  Service: Endoscopy;  Laterality: N/A;  . EYE SURGERY    . FLEXIBLE SIGMOIDOSCOPY N/A 09/18/2015   Procedure: FLEXIBLE SIGMOIDOSCOPY;  Surgeon: Carol Ada, MD;  Location: [facility] ENDOSCOPY;  Service: Endoscopy;  Laterality: N/A;  . HOT HEMOSTASIS N/A  07/31/2015   Procedure: HOT HEMOSTASIS (ARGON PLASMA COAGULATION/BICAP);  Surgeon: Carol Ada, MD;  Location: [facility] ENDOSCOPY;  Service: Endoscopy;  Laterality: N/A;    Social History   Social History  . Marital status: Single    Spouse name: N/A  . Number of children: N/A  . Years of education: N/A   Occupational History  . Not on file.   Social History Main Topics  . Smoking status: Never Smoker  . Smokeless tobacco: Never Used  . Alcohol use No  . Drug use: No  . Sexual activity: Not on file   Other Topics Concern  . Not on file   Social History Narrative   Lives at [facility] independent living facility along with her "aunt" Manus Gunning (actually just a friend) who was originally her 80. "Cousin"/friend Sim Boast has now taken over HCPOA  as aunt with Dementia.       Patient is DNR/DNI    PCP Dr. Ernie Hew on [address] in [place].                 Family History  Problem Relation Age of Onset  . COPD Mother   . Bone cancer Father     Current Facility-Administered Medications  Medication Dose Route Frequency Provider Last Rate Last Dose  . 0.9 %  sodium chloride infusion   Intravenous Once Barton Dubois, MD      . albuterol (PROVENTIL) (2.5 MG/3ML) 0.083% nebulizer solution 2.5 mg  2.5 mg Nebulization Q6H PRN Patrecia Pour, MD      . atorvastatin (LIPITOR) tablet 10 mg  10 mg Oral q1800 Patrecia Pour, MD   10 mg at 05/04/16 1900  . budesonide (PULMICORT) nebulizer solution 0.25 mg  0.25 mg Nebulization BID Patrecia Pour, MD   0.25 mg at 05/05/16 0826  . famotidine (PEPCID) tablet 20 mg  20 mg Oral QHS Patrecia Pour, MD   20 mg at 05/04/16 2128  . guaiFENesin-dextromethorphan (ROBITUSSIN DM) 100-10 MG/5ML syrup 5 mL  5 mL Oral Q4H PRN Barton Dubois, MD   5 mL at 05/05/16 0630  . iron polysaccharides (NIFEREX) capsule 150 mg  150 mg Oral Daily Patrecia Pour, MD   150 mg at 05/05/16 1100  . loratadine (CLARITIN) tablet 10 mg  10 mg Oral Daily PRN Patrecia Pour, MD       . montelukast (SINGULAIR) tablet 10 mg  10 mg Oral QHS Patrecia Pour, MD   10 mg at 05/04/16 2128  . pantoprazole (PROTONIX) EC tablet 40 mg  40 mg Oral Daily Patrecia Pour, MD   40 mg at 05/05/16 1100  . polyethylene glycol (MIRALAX / GLYCOLAX) packet 17 g  17 g Oral Daily Barton Dubois, MD      . sertraline (ZOLOFT) tablet 100 mg  100 mg Oral Daily Patrecia Pour, MD   100 mg at 05/05/16 1100  . sodium chloride flush (NS) 0.9 % injection 3 mL  3 mL Intravenous Q12H Patrecia Pour, MD   3 mL at 05/04/16 2200     Allergies  Allergen Reactions  . Fish Allergy Swelling  .  Pepto-Bismol [Bismuth Subsalicylate] Nausea And Vomiting  . Cortisone Itching and Rash    Injections and cream only.  PO ok.  . Trichophyton Itching    Also sneezing accompanying as well    ROS: Constitutional:  No fevers, chills, sweats.  Weight stable Eyes:  No vision changes, No discharge HENT:  No sore throats, nasal drainage Lymph: No neck swelling, No bruising easily Pulmonary:  No cough, productive sputum CV: No orthopnea, PND  Patient walks 5-10 minutes without difficulty.  No exertional chest/neck/shoulder/arm pain. GI: No personal nor family history of GI/colon cancer, inflammatory bowel disease, irritable bowel syndrome, allergy such as Celiac Sprue, dietary/dairy problems, colitis, ulcers nor gastritis.  No recent sick contacts/gastroenteritis.  No travel outside the country.  No changes in diet. Renal: No UTIs, No hematuria Genital:  No drainage, bleeding, masses Musculoskeletal: No severe joint pain.  Good ROM major joints Skin:  No sores or lesions.  No rashes Heme/Lymph:  No easy bleeding.  No swollen lymph nodes Neuro: No focal weakness/numbness.  No seizures Psych: No suicidal ideation.  No hallucinations.  Gets very anxious but is consolable.    BP 135/71 (BP Location: Left Arm)   Pulse 80   Temp 99.2 F (37.3 C) (Oral)   Resp 18   Ht _0  (1.702 m)   Wt 107 kg (235 lb 14.3 oz)   SpO2 99%   BMI  36.95 kg/m   Physical Exam: General: Pt awake/alert/oriented x4 in no major acute distress.  Initially smiling and waving and me.  Then tearful and anxious later when surgery was discussed  Eyes: PERRL, normal EOM. Sclera nonicteric.  Wears glasses Neuro: CN II-XII intact w/o focal sensory/motor deficits. Lymph: No head/neck/groin lymphadenopathy Psych:  No delerium/psychosis/paranoia.  Initially calm & smiling.  Then anxious and tearful.  Consolable. HENT: Normocephalic, Mucus membranes moist.  No thrush Neck: Supple, No tracheal deviation Chest: No pain.  Good respiratory excursion.  Distant breath sounds CV:  Pulses intact.  Regular rhythm Abdomen: Soft, morbidly obese Nondistended.  Nontender.  No incarcerated hernias. Ext:  SCDs BLE.  No significant edema.  No cyanosis Skin: No petechiae / purpurea.  No major sores Musculoskeletal: No severe joint pain.  Good ROM major joints   Results:   Labs: Results for orders placed or performed during the hospital encounter of 05/03/16 (from the past 48 hour(s))  Type and screen [place]     Status: None   Collection Time: 05/03/16  4:25 PM  Result Value Ref Range   ABO/RH(D) A POS    Antibody Screen NEG    Sample Expiration 05/06/2016    Unit Number [id number]    Blood Component Type RED CELLS,LR    Unit division 00    Status of Unit ISSUED,FINAL    Transfusion Status OK TO TRANSFUSE    Crossmatch Result Compatible    Unit Number [id number]    Blood Component Type RED CELLS,LR    Unit division 00    Status of Unit ISSUED,FINAL    Transfusion Status OK TO TRANSFUSE    Crossmatch Result Compatible   Prepare RBC     Status: None   Collection Time: 05/03/16  4:25 PM  Result Value Ref Range   Order Confirmation ORDER PROCESSED BY BLOOD BANK   Occult blood, poc device     Status: Abnormal   Collection Time: 05/03/16  4:25 PM  Result Value Ref Range   Fecal Occult Bld POSITIVE (A) NEGATIVE  Hemoglobin  and hematocrit, blood     Status: Abnormal   Collection Time: 05/03/16 10:58 PM  Result Value Ref Range   Hemoglobin 7.8 (L) 12.0 - 15.0 g/dL   HCT 27.8 (L) 36.0 - 54.5 %  Basic metabolic panel     Status: Abnormal   Collection Time: 05/04/16  3:35 AM  Result Value Ref Range   Sodium 140 135 - 145 mmol/L   Potassium 3.4 (L) 3.5 - 5.1 mmol/L    Comment: RESULT REPEATED AND VERIFIED DELTA CHECK NOTED NO VISIBLE HEMOLYSIS    Chloride 88 (L) 101 - 111 mmol/L   CO2 46 (H) 22 - 32 mmol/L   Glucose, Bld 191 (H) 65 - 99 mg/dL   BUN 6 6 - 20 mg/dL   Creatinine, Ser 0.46 0.44 - 1.00 mg/dL   Calcium 8.3 (L) 8.9 - 10.3 mg/dL   GFR calc non Af Amer >60 >60 mL/min   GFR calc Af Amer >60 >60 mL/min    Comment: (NOTE) The eGFR has been calculated using the CKD EPI equation. This calculation has not been validated in all clinical situations. eGFR's persistently <60 mL/min signify possible Chronic Kidney Disease.    Anion gap 6 5 - 15  CBC     Status: Abnormal   Collection Time: 05/04/16  3:35 AM  Result Value Ref Range   WBC 4.0 4.0 - 10.5 K/uL   RBC 3.51 (L) 3.87 - 5.11 MIL/uL   Hemoglobin 7.6 (L) 12.0 - 15.0 g/dL   HCT 27.1 (L) 36.0 - 46.0 %   MCV 77.2 (L) 78.0 - 100.0 fL   MCH 21.7 (L) 26.0 - 34.0 pg   MCHC 28.0 (L) 30.0 - 36.0 g/dL   RDW 18.9 (H) 11.5 - 15.5 %   Platelets 251 150 - 400 K/uL  Prepare RBC     Status: None   Collection Time: 05/04/16  9:00 AM  Result Value Ref Range   Order Confirmation ORDER PROCESSED BY BLOOD BANK   CBC     Status: Abnormal   Collection Time: 05/05/16  8:30 AM  Result Value Ref Range   WBC 3.3 (L) 4.0 - 10.5 K/uL   RBC 3.74 (L) 3.87 - 5.11 MIL/uL   Hemoglobin 8.9 (L) 12.0 - 15.0 g/dL   HCT 29.5 (L) 36.0 - 46.0 %   MCV 78.9 78.0 - 100.0 fL   MCH 23.8 (L) 26.0 - 34.0 pg   MCHC 30.2 30.0 - 36.0 g/dL   RDW 20.1 (H) 11.5 - 15.5 %   Platelets 242 150 - 400 K/uL    Imaging / Studies: No results found.  Medications / Allergies: per  chart  Antibiotics: Anti-infectives    None      Assessment  Shelley [name]  50 y.o. female       Problem List:  Principal Problem:   Acute lower GI bleeding Active Problems:   Asthma, persistent controlled   Obstructive sleep apnea   Chronic respiratory failure (HCC)   Iron deficiency anemia due to chronic blood loss   Hypokalemia   Diabetes mellitus (HCC)   Obesity, Class III, BMI 40-49.9 (morbid obesity) ([place])   Internal hemorrhoids   Mental retardation   History of arteriovenous malformation (AVM)   GERD (gastroesophageal reflux disease)   Acute blood loss anemia   Obesity hypoventilation syndrome (HCC)   Morbid obesity (HCC)   Recurrent severe bright red blood per rectum felt to be due to internal hemorrhoids given normal colonoscopy in April.  Persistent despite banding is 3, including two in the past year after that prior severe episode.  Oxygen dependent hypoventilation syndrome.  Cognitive developmental delay with anxiety but consolable  Plan:  I think she has exhausted nonoperative management with bowel regimen and bandings x 3, 2 since her last severe episode.  Still having bleeding significant enough to require admission and transfusions.  I think this will not get better without hemorrhoidal surgery.    Technically quite feasible to do examination under anesthesia with hemorrhoidal ligation and hemorrhoidectomy.  The main risk is with her zero oxygen dependence.  Would try do this in lithotomy with deep sedation.  Minimize narcotics.  Perhaps just mask airway.  May need general anesthesia given the sensitive area.  Had a long discussion with the patient about her risks and benefits.   After discussing with her she wished me to talk to her close friend and caregiver, Sharlene Motts.  [phone].  I then discussed with her caregiver at length on the phone.  He feels its reasonable that we have exhausted other options.  He understands her operative risk but does  think she gets around it moves around okay.  Patient is tearful and nervous but consolable.  The anatomy & physiology of the anorectal region was discussed.  The pathophysiology of hemorrhoids and differential diagnosis was discussed.  Natural history risks without surgery was discussed.   I stressed the importance of a bowel regimen to have daily soft bowel movements to minimize progression of disease.  Interventions such as sclerotherapy & banding were discussed.  The patient's symptoms are not adequately controlled by medicines and other non-operative treatments.  I feel the risks & problems of no surgery outweigh the operative risks; therefore, I recommended surgery to treat the hemorrhoids by ligation, pexy, and possible resection.  Risks such as bleeding, infection, urinary difficulties, injury to other organs, need for repair of tissues / organs, need for further treatment, heart attack, death, and other risks were discussed.   I noted a good likelihood this will help address the problem.  Goals of post-operative recovery were discussed as well.  Possibility that this will not correct all symptoms was explained.  Post-operative pain, bleeding, constipation, and other problems after surgery were discussed.  We will work to minimize complications.   Educational handouts further explaining the pathology, treatment options, and bowel regimen were given as well.  Questions were answered.  The patient expresses understanding & wishes to consider surgery.  Her caregiver agrees with surgery.  I discussed at length with the nursing staff as well so they can help assess and console her.  We will discuss again in the morning.  Because she just ate and she is not severely bleeding, we will wait to do this in the morning.  Give her a chance to think about things and be sure she is comfortable with proceeding.  I did discuss with the admitting hospitalist service about pulmonary clearance.  Dr Dyann Kief feels that  she is as good as she is going to get an do not know if pulmonary really as much of a role to clear since they would be understandably guarded but cannot acute optimize - she is at her pulmonary baseline.  She is been transfused above a hemoglobin of seven.  She is exhausted nonsurgical options.  Plan surgery in the morning.  -VTE prophylaxis- SCDs, etc -mobilize as tolerated to help recovery    [name], M.D., F.A.C.S. [facility]  [facility] [address] [place], [place] [address] [phone] Main / Paging   05/05/2016  Note: Portions of this report may have been transcribed using voice recognition software. Every effort was made to ensure accuracy; however, inadvertent computerized transcription errors may be present.   Any transcriptional errors that result from this process are unintentional.

## 2016-05-05 NOTE — Progress Notes (Signed)
Pt friend/HCPOA, Onalee Huaavid, visiting with patient, discussed procedure planned for tomorrow with patient and Onalee HuaDavid. Pt agreed to allow Onalee HuaDavid to sign consent form. HCPOA documentation received and placed in patients chart. Consent signed and placed in patients chart.

## 2016-05-05 NOTE — Progress Notes (Signed)
Surgeon stopped by to discuss upcoming s/p with patient. Patient began to sob uncontrollably.  Discussed benefits of procedure. Pt is uncertain at this time if she will follow through with surgery as planned.

## 2016-05-05 NOTE — Plan of Care (Signed)
Problem: Skin Integrity: Goal: Risk for impaired skin integrity will decrease Outcome: Completed/Met Date Met: 05/05/16 Skin remains intact.  Problem: Fluid Volume: Goal: Ability to maintain a balanced intake and output will improve Outcome: Completed/Met Date Met: 05/05/16 Pt tolerating pos's well

## 2016-05-06 ENCOUNTER — Inpatient Hospital Stay (HOSPITAL_COMMUNITY): Payer: Medicare Other | Admitting: Anesthesiology

## 2016-05-06 ENCOUNTER — Encounter (HOSPITAL_COMMUNITY)
Admission: EM | Disposition: A | Discharge status: Home Health | Payer: Self-pay | Source: Home / Self Care | Attending: Pulmonary Disease

## 2016-05-06 ENCOUNTER — Inpatient Hospital Stay (HOSPITAL_COMMUNITY): Payer: Medicare Other

## 2016-05-06 DIAGNOSIS — K648 Other hemorrhoids: Principal | ICD-10-CM

## 2016-05-06 DIAGNOSIS — J45998 Other asthma: Secondary | ICD-10-CM

## 2016-05-06 DIAGNOSIS — F819 Developmental disorder of scholastic skills, unspecified: Secondary | ICD-10-CM

## 2016-05-06 DIAGNOSIS — J9621 Acute and chronic respiratory failure with hypoxia: Secondary | ICD-10-CM

## 2016-05-06 HISTORY — PX: HEMORRHOID SURGERY: SHX153

## 2016-05-06 LAB — CBC
HCT: 29.2 % — ABNORMAL LOW (ref 36.0–46.0)
HEMATOCRIT: 32.6 % — AB (ref 36.0–46.0)
Hemoglobin: 8.9 g/dL — ABNORMAL LOW (ref 12.0–15.0)
Hemoglobin: 8.4 g/dL — ABNORMAL LOW (ref 12.0–15.0)
MCH: 22.4 pg — ABNORMAL LOW (ref 26.0–34.0)
MCH: 23 pg — AB (ref 26.0–34.0)
MCHC: 27.3 g/dL — ABNORMAL LOW (ref 30.0–36.0)
MCHC: 28.8 g/dL — ABNORMAL LOW (ref 30.0–36.0)
MCV: 82.1 fL (ref 78.0–100.0)
MCV: 79.8 fL (ref 78.0–100.0)
PLATELETS: 254 10*3/uL (ref 150–400)
PLATELETS: 257 10*3/uL (ref 150–400)
RBC: 3.97 MIL/uL (ref 3.87–5.11)
RBC: 3.66 MIL/uL — AB (ref 3.87–5.11)
RDW: 21.2 % — AB (ref 11.5–15.5)
RDW: 21.3 % — ABNORMAL HIGH (ref 11.5–15.5)
WBC: 4.7 10*3/uL (ref 4.0–10.5)
WBC: 4.1 10*3/uL (ref 4.0–10.5)

## 2016-05-06 LAB — BLOOD GAS, ARTERIAL
Acid-Base Excess: 18.6 mmol/L — ABNORMAL HIGH (ref 0.0–2.0)
Bicarbonate: 45 mmol/L — ABNORMAL HIGH (ref 20.0–28.0)
DRAWN BY: 295031
FIO2: 100
O2 SAT: 99.9 %
PATIENT TEMPERATURE: 37
PCO2 ART: 58.5 mmHg — AB (ref 32.0–48.0)
PEEP: 5 cmH2O
PH ART: 7.498 — AB (ref 7.350–7.450)
PO2 ART: 267 mmHg — AB (ref 83.0–108.0)
RATE: 16 resp/min
VT: 500 mL

## 2016-05-06 LAB — LACTIC ACID, PLASMA: Lactic Acid, Venous: 1.8 mmol/L (ref 0.5–1.9)

## 2016-05-06 LAB — RENAL FUNCTION PANEL
Albumin: 2.9 g/dL — ABNORMAL LOW (ref 3.5–5.0)
Anion gap: 11 (ref 5–15)
BUN: 14 mg/dL (ref 6–20)
CHLORIDE: 88 mmol/L — AB (ref 101–111)
CO2: 40 mmol/L — AB (ref 22–32)
CREATININE: 0.64 mg/dL (ref 0.44–1.00)
Calcium: 8.3 mg/dL — ABNORMAL LOW (ref 8.9–10.3)
GFR calc Af Amer: >60 mL/min (ref >60)
Glucose, Bld: 187 mg/dL — ABNORMAL HIGH (ref 65–99)
Phosphorus: 2.1 mg/dL — ABNORMAL LOW (ref 2.5–4.6)
Potassium: 3.5 mmol/L (ref 3.5–5.1)
SODIUM: 139 mmol/L (ref 135–145)

## 2016-05-06 LAB — GLUCOSE, CAPILLARY
GLUCOSE-CAPILLARY: 170 mg/dL — AB (ref 65–99)
GLUCOSE-CAPILLARY: 179 mg/dL — AB (ref 65–99)
GLUCOSE-CAPILLARY: 202 mg/dL — AB (ref 65–99)
GLUCOSE-CAPILLARY: 200 mg/dL — AB (ref 65–99)
Glucose-Capillary: 217 mg/dL — ABNORMAL HIGH (ref 65–99)

## 2016-05-06 LAB — CBC WITH DIFFERENTIAL/PLATELET
Basophils Absolute: 0 10*3/uL (ref 0.0–0.1)
Basophils Relative: 0 %
EOS ABS: 0 10*3/uL (ref 0.0–0.7)
EOS PCT: 0 %
HCT: 29.7 % — ABNORMAL LOW (ref 36.0–46.0)
Hemoglobin: 8.6 g/dL — ABNORMAL LOW (ref 12.0–15.0)
LYMPHS ABS: 1.7 10*3/uL (ref 0.7–4.0)
Lymphocytes Relative: 33 %
MCH: 22.8 pg — ABNORMAL LOW (ref 26.0–34.0)
MCHC: 29 g/dL — AB (ref 30.0–36.0)
MCV: 78.8 fL (ref 78.0–100.0)
MONO ABS: 0.6 10*3/uL (ref 0.1–1.0)
Monocytes Relative: 12 %
Neutro Abs: 2.9 10*3/uL (ref 1.7–7.7)
Neutrophils Relative %: 56 %
PLATELETS: 263 10*3/uL (ref 150–400)
RBC: 3.77 MIL/uL — AB (ref 3.87–5.11)
RDW: 21.6 % — AB (ref 11.5–15.5)
WBC: 5.1 10*3/uL (ref 4.0–10.5)

## 2016-05-06 LAB — SURGICAL PCR SCREEN
MRSA, PCR: NEGATIVE
STAPHYLOCOCCUS AUREUS: NEGATIVE

## 2016-05-06 LAB — MAGNESIUM: MAGNESIUM: 1.4 mg/dL — AB (ref 1.7–2.4)

## 2016-05-06 LAB — PROCALCITONIN

## 2016-05-06 SURGERY — EXAM UNDER ANESTHESIA
Anesthesia: General | Site: Anus

## 2016-05-06 MED ORDER — SODIUM CHLORIDE 0.9 % IV SOLN
INTRAVENOUS | Status: DC
  Administered 2016-05-06: 12:00:00 via INTRAVENOUS

## 2016-05-06 MED ORDER — CHLORHEXIDINE GLUCONATE 0.12% ORAL RINSE (MEDLINE KIT)
15.0000 mL | Freq: Two times a day (BID) | OROMUCOSAL | Status: DC
  Administered 2016-05-06 – 2016-05-09 (×6): 15 mL via OROMUCOSAL

## 2016-05-06 MED ORDER — ROCURONIUM BROMIDE 50 MG/5ML IV SOSY
PREFILLED_SYRINGE | INTRAVENOUS | Status: AC
  Filled 2016-05-06: qty 5

## 2016-05-06 MED ORDER — BUPIVACAINE LIPOSOME 1.3 % IJ SUSP
INTRAMUSCULAR | Status: DC | PRN
  Administered 2016-05-06: 20 mL

## 2016-05-06 MED ORDER — METOCLOPRAMIDE HCL 5 MG/ML IJ SOLN: 10.0000 mg | Freq: Once | INTRAMUSCULAR | Status: DC | PRN

## 2016-05-06 MED ORDER — FENTANYL CITRATE (PF) 100 MCG/2ML IJ SOLN
INTRAMUSCULAR | Status: AC
  Filled 2016-05-06: qty 2

## 2016-05-06 MED ORDER — INSULIN ASPART 100 UNIT/ML ~~LOC~~ SOLN
0.0000 [IU] | SUBCUTANEOUS | Status: DC
  Administered 2016-05-06: 2 [IU] via SUBCUTANEOUS
  Administered 2016-05-06: 3 [IU] via SUBCUTANEOUS
  Administered 2016-05-06: 2 [IU] via SUBCUTANEOUS
  Administered 2016-05-07 (×2): 3 [IU] via SUBCUTANEOUS
  Administered 2016-05-07: 2 [IU] via SUBCUTANEOUS

## 2016-05-06 MED ORDER — SUCCINYLCHOLINE CHLORIDE 200 MG/10ML IV SOSY
PREFILLED_SYRINGE | INTRAVENOUS | Status: AC
  Filled 2016-05-06: qty 10

## 2016-05-06 MED ORDER — ALBUTEROL SULFATE HFA 108 (90 BASE) MCG/ACT IN AERS
INHALATION_SPRAY | RESPIRATORY_TRACT | Status: AC
  Filled 2016-05-06: qty 6.7

## 2016-05-06 MED ORDER — PIPERACILLIN-TAZOBACTAM 3.375 G IVPB
3.3750 g | Freq: Three times a day (TID) | INTRAVENOUS | Status: DC
  Administered 2016-05-06 – 2016-05-12 (×17): 3.375 g via INTRAVENOUS
  Filled 2016-05-06 (×15): qty 50

## 2016-05-06 MED ORDER — SODIUM CHLORIDE 0.9% FLUSH: 3.0000 mL | INTRAVENOUS | Status: DC | PRN

## 2016-05-06 MED ORDER — 0.9 % SODIUM CHLORIDE (POUR BTL) OPTIME
TOPICAL | Status: DC | PRN
  Administered 2016-05-06: 1000 mL

## 2016-05-06 MED ORDER — SODIUM CHLORIDE 0.9 % IV SOLN: 250.0000 mL | INTRAVENOUS | Status: DC | PRN

## 2016-05-06 MED ORDER — SUGAMMADEX SODIUM 200 MG/2ML IV SOLN
INTRAVENOUS | Status: DC | PRN
  Administered 2016-05-06: 200 mg via INTRAVENOUS

## 2016-05-06 MED ORDER — MAGNESIUM SULFATE 4 GM/100ML IV SOLN
4.0000 g | Freq: Once | INTRAVENOUS | Status: AC
  Administered 2016-05-06: 4 g via INTRAVENOUS
  Filled 2016-05-06: qty 100

## 2016-05-06 MED ORDER — SODIUM CHLORIDE 0.9 % IJ SOLN
INTRAMUSCULAR | Status: AC
  Filled 2016-05-06: qty 20

## 2016-05-06 MED ORDER — MIDAZOLAM HCL 2 MG/2ML IJ SOLN
1.0000 mg | INTRAMUSCULAR | Status: DC | PRN
  Administered 2016-05-06 (×2): 1 mg via INTRAVENOUS
  Filled 2016-05-06: qty 4

## 2016-05-06 MED ORDER — ORAL CARE MOUTH RINSE
15.0000 mL | Freq: Four times a day (QID) | OROMUCOSAL | Status: DC
  Administered 2016-05-06 – 2016-05-08 (×10): 15 mL via OROMUCOSAL

## 2016-05-06 MED ORDER — MEPERIDINE HCL 50 MG/ML IJ SOLN: 6.2500 mg | INTRAMUSCULAR | Status: DC | PRN

## 2016-05-06 MED ORDER — BUPIVACAINE HCL (PF) 0.5 % IJ SOLN
INTRAMUSCULAR | Status: AC
  Filled 2016-05-06: qty 30

## 2016-05-06 MED ORDER — SUCCINYLCHOLINE CHLORIDE 20 MG/ML IJ SOLN
INTRAMUSCULAR | Status: DC | PRN
  Administered 2016-05-06 (×2): 100 mg via INTRAVENOUS

## 2016-05-06 MED ORDER — PROPOFOL 10 MG/ML IV BOLUS
INTRAVENOUS | Status: DC | PRN
  Administered 2016-05-06: 150 mg via INTRAVENOUS
  Administered 2016-05-06: 50 mg via INTRAVENOUS

## 2016-05-06 MED ORDER — SERTRALINE HCL 50 MG PO TABS
100.0000 mg | ORAL_TABLET | Freq: Every day | ORAL | Status: DC
  Administered 2016-05-08 – 2016-05-09 (×2): 100 mg
  Filled 2016-05-06 (×2): qty 1

## 2016-05-06 MED ORDER — LIDOCAINE HCL (CARDIAC) 20 MG/ML IV SOLN
INTRAVENOUS | Status: DC | PRN
  Administered 2016-05-06: 50 mg via INTRAVENOUS

## 2016-05-06 MED ORDER — ONDANSETRON HCL 4 MG/2ML IJ SOLN
INTRAMUSCULAR | Status: AC
  Filled 2016-05-06: qty 2

## 2016-05-06 MED ORDER — FENTANYL CITRATE (PF) 100 MCG/2ML IJ SOLN
INTRAMUSCULAR | Status: DC | PRN
  Administered 2016-05-06 (×2): 25 ug via INTRAVENOUS

## 2016-05-06 MED ORDER — DIBUCAINE 1 % RE OINT
TOPICAL_OINTMENT | RECTAL | Status: AC
  Filled 2016-05-06: qty 28

## 2016-05-06 MED ORDER — VANCOMYCIN HCL IN DEXTROSE 1-5 GM/200ML-% IV SOLN
1000.0000 mg | Freq: Three times a day (TID) | INTRAVENOUS | Status: DC
  Administered 2016-05-07 – 2016-05-08 (×4): 1000 mg via INTRAVENOUS
  Filled 2016-05-06 (×4): qty 200

## 2016-05-06 MED ORDER — MONTELUKAST SODIUM 10 MG PO TABS
10.0000 mg | ORAL_TABLET | Freq: Every day | ORAL | Status: DC
  Administered 2016-05-08: 10 mg
  Filled 2016-05-06 (×2): qty 1

## 2016-05-06 MED ORDER — SODIUM CHLORIDE 0.9% FLUSH
3.0000 mL | Freq: Two times a day (BID) | INTRAVENOUS | Status: DC
  Administered 2016-05-07 – 2016-05-09 (×5): 3 mL via INTRAVENOUS

## 2016-05-06 MED ORDER — MIDAZOLAM HCL 2 MG/2ML IJ SOLN
INTRAMUSCULAR | Status: AC
  Filled 2016-05-06: qty 2

## 2016-05-06 MED ORDER — ATORVASTATIN CALCIUM 10 MG PO TABS
10.0000 mg | ORAL_TABLET | Freq: Every day | ORAL | Status: DC
  Administered 2016-05-08: 10 mg
  Filled 2016-05-06 (×3): qty 1

## 2016-05-06 MED ORDER — NYSTATIN 100000 UNIT/GM EX POWD
Freq: Three times a day (TID) | CUTANEOUS | Status: DC
  Administered 2016-05-06 – 2016-05-14 (×25): via TOPICAL
  Filled 2016-05-06 (×2): qty 15

## 2016-05-06 MED ORDER — FUROSEMIDE 10 MG/ML IJ SOLN
20.0000 mg | Freq: Once | INTRAMUSCULAR | Status: AC
  Administered 2016-05-06: 20 mg via INTRAVENOUS
  Filled 2016-05-06: qty 2

## 2016-05-06 MED ORDER — PROPOFOL 10 MG/ML IV BOLUS
INTRAVENOUS | Status: AC
  Filled 2016-05-06: qty 20

## 2016-05-06 MED ORDER — DEXMEDETOMIDINE HCL IN NACL 200 MCG/50ML IV SOLN
0.4000 ug/kg/h | INTRAVENOUS | Status: DC
  Administered 2016-05-06: 0.4 ug/kg/h via INTRAVENOUS
  Administered 2016-05-06: 1.2 ug/kg/h via INTRAVENOUS
  Administered 2016-05-06: 0.7 ug/kg/h via INTRAVENOUS
  Administered 2016-05-06: 1 ug/kg/h via INTRAVENOUS
  Administered 2016-05-06: 1.1 ug/kg/h via INTRAVENOUS
  Administered 2016-05-06 (×2): 1 ug/kg/h via INTRAVENOUS
  Administered 2016-05-06 – 2016-05-07 (×2): 1.2 ug/kg/h via INTRAVENOUS
  Administered 2016-05-07: 1.1 ug/kg/h via INTRAVENOUS
  Administered 2016-05-07: 1.2 ug/kg/h via INTRAVENOUS
  Administered 2016-05-07: 0.5 ug/kg/h via INTRAVENOUS
  Administered 2016-05-07: 1.1 ug/kg/h via INTRAVENOUS
  Administered 2016-05-07 (×2): 0.7 ug/kg/h via INTRAVENOUS
  Administered 2016-05-07: 0.4 ug/kg/h via INTRAVENOUS
  Administered 2016-05-07: 0.5 ug/kg/h via INTRAVENOUS
  Administered 2016-05-07: 1 ug/kg/h via INTRAVENOUS
  Administered 2016-05-07: 0.7 ug/kg/h via INTRAVENOUS
  Administered 2016-05-08: 0.4 ug/kg/h via INTRAVENOUS
  Administered 2016-05-08: 1 ug/kg/h via INTRAVENOUS
  Filled 2016-05-06 (×7): qty 50
  Filled 2016-05-06 (×3): qty 100
  Filled 2016-05-06: qty 50
  Filled 2016-05-06: qty 100
  Filled 2016-05-06 (×5): qty 50

## 2016-05-06 MED ORDER — ACETAMINOPHEN 10 MG/ML IV SOLN
1000.0000 mg | Freq: Once | INTRAVENOUS | Status: AC
  Administered 2016-05-07: 1000 mg via INTRAVENOUS
  Filled 2016-05-06: qty 100

## 2016-05-06 MED ORDER — FENTANYL CITRATE (PF) 250 MCG/5ML IJ SOLN
INTRAMUSCULAR | Status: AC
  Filled 2016-05-06: qty 5

## 2016-05-06 MED ORDER — LACTATED RINGERS IV SOLN
INTRAVENOUS | Status: DC | PRN
  Administered 2016-05-06: 07:00:00 via INTRAVENOUS

## 2016-05-06 MED ORDER — CLINDAMYCIN PHOSPHATE 900 MG/50ML IV SOLN
INTRAVENOUS | Status: AC
  Filled 2016-05-06: qty 50

## 2016-05-06 MED ORDER — VITAMIN C 500 MG PO TABS
500.0000 mg | ORAL_TABLET | Freq: Two times a day (BID) | ORAL | Status: DC
  Administered 2016-05-08 – 2016-05-14 (×13): 500 mg via ORAL
  Filled 2016-05-06 (×15): qty 1

## 2016-05-06 MED ORDER — ALBUTEROL SULFATE HFA 108 (90 BASE) MCG/ACT IN AERS
INHALATION_SPRAY | RESPIRATORY_TRACT | Status: DC | PRN
  Administered 2016-05-06 (×2): 2 via RESPIRATORY_TRACT

## 2016-05-06 MED ORDER — VANCOMYCIN HCL 10 G IV SOLR
2000.0000 mg | Freq: Once | INTRAVENOUS | Status: AC
  Administered 2016-05-06: 2000 mg via INTRAVENOUS
  Filled 2016-05-06: qty 2000

## 2016-05-06 MED ORDER — ROCURONIUM BROMIDE 100 MG/10ML IV SOLN
INTRAVENOUS | Status: DC | PRN
  Administered 2016-05-06: 30 mg via INTRAVENOUS

## 2016-05-06 MED ORDER — BUPIVACAINE HCL (PF) 0.5 % IJ SOLN
INTRAMUSCULAR | Status: DC | PRN
  Administered 2016-05-06: 20 mL

## 2016-05-06 MED ORDER — FENTANYL CITRATE (PF) 100 MCG/2ML IJ SOLN: 25.0000 ug | INTRAMUSCULAR | Status: DC | PRN

## 2016-05-06 MED ORDER — ONDANSETRON HCL 4 MG/2ML IJ SOLN
INTRAMUSCULAR | Status: DC | PRN
  Administered 2016-05-06: 4 mg via INTRAVENOUS

## 2016-05-06 MED ORDER — FENTANYL CITRATE (PF) 100 MCG/2ML IJ SOLN
25.0000 ug | INTRAMUSCULAR | Status: DC | PRN
  Administered 2016-05-06 (×2): 50 ug via INTRAVENOUS
  Filled 2016-05-06 (×2): qty 2

## 2016-05-06 MED ORDER — HYDROCORTISONE ACE-PRAMOXINE 2.5-1 % RE CREA
1.0000 "application " | TOPICAL_CREAM | Freq: Four times a day (QID) | RECTAL | Status: DC
  Administered 2016-05-07 – 2016-05-14 (×26): 1 via RECTAL
  Filled 2016-05-06: qty 30

## 2016-05-06 MED ORDER — IPRATROPIUM-ALBUTEROL 0.5-2.5 (3) MG/3ML IN SOLN
3.0000 mL | Freq: Four times a day (QID) | RESPIRATORY_TRACT | Status: DC
  Administered 2016-05-06 – 2016-05-09 (×15): 3 mL via RESPIRATORY_TRACT
  Filled 2016-05-06 (×16): qty 3

## 2016-05-06 MED ORDER — LIDOCAINE 2% (20 MG/ML) 5 ML SYRINGE
INTRAMUSCULAR | Status: AC
  Filled 2016-05-06: qty 5

## 2016-05-06 MED ORDER — FAMOTIDINE IN NACL 20-0.9 MG/50ML-% IV SOLN
20.0000 mg | Freq: Two times a day (BID) | INTRAVENOUS | Status: DC
  Administered 2016-05-06 – 2016-05-09 (×7): 20 mg via INTRAVENOUS
  Filled 2016-05-06 (×8): qty 50

## 2016-05-06 SURGICAL SUPPLY — 32 items
BENZOIN TINCTURE PRP APPL 2/3 (GAUZE/BANDAGES/DRESSINGS) ×3 IMPLANT
BLADE SURG 15 STRL LF DISP TIS (BLADE) ×2 IMPLANT
BLADE SURG 15 STRL SS (BLADE) ×1
BRIEF STRETCH FOR OB PAD LRG (UNDERPADS AND DIAPERS) ×3 IMPLANT
COVER SURGICAL LIGHT HANDLE (MISCELLANEOUS) ×3 IMPLANT
DECANTER SPIKE VIAL GLASS SM (MISCELLANEOUS) ×3 IMPLANT
DRAPE LAPAROTOMY T 102X78X121 (DRAPES) ×3 IMPLANT
DRAPE SHEET LG 3/4 BI-LAMINATE (DRAPES) ×3 IMPLANT
DRSG PAD ABDOMINAL 8X10 ST (GAUZE/BANDAGES/DRESSINGS) IMPLANT
ELECT PENCIL ROCKER SW 15FT (MISCELLANEOUS) ×3 IMPLANT
ELECT REM PT RETURN 9FT ADLT (ELECTROSURGICAL) ×3
ELECTRODE REM PT RTRN 9FT ADLT (ELECTROSURGICAL) ×2 IMPLANT
GAUZE SPONGE 4X4 12PLY STRL (GAUZE/BANDAGES/DRESSINGS) IMPLANT
GAUZE SPONGE 4X4 16PLY XRAY LF (GAUZE/BANDAGES/DRESSINGS) ×3 IMPLANT
GLOVE ECLIPSE 8.0 STRL XLNG CF (GLOVE) ×3 IMPLANT
GLOVE INDICATOR 8.0 STRL GRN (GLOVE) ×3 IMPLANT
GOWN STRL REUS W/TWL XL LVL3 (GOWN DISPOSABLE) ×6 IMPLANT
KIT BASIN OR (CUSTOM PROCEDURE TRAY) ×3 IMPLANT
LUBRICANT JELLY K Y 4OZ (MISCELLANEOUS) ×3 IMPLANT
NEEDLE HYPO 22GX1.5 SAFETY (NEEDLE) ×3 IMPLANT
PACK BASIC VI WITH GOWN DISP (CUSTOM PROCEDURE TRAY) ×3 IMPLANT
SCRUB TECHNI CARE 4 OZ NO DYE (MISCELLANEOUS) ×3 IMPLANT
SHEARS HARMONIC 9CM CVD (BLADE) IMPLANT
SUT CHROMIC 2 0 SH (SUTURE) ×3 IMPLANT
SUT CHROMIC 3 0 SH 27 (SUTURE) IMPLANT
SUT VIC AB 2-0 SH 27 (SUTURE)
SUT VIC AB 2-0 SH 27X BRD (SUTURE) IMPLANT
SUT VIC AB 2-0 UR6 27 (SUTURE) ×18 IMPLANT
SYR 20CC LL (SYRINGE) ×3 IMPLANT
TOWEL OR 17X26 10 PK STRL BLUE (TOWEL DISPOSABLE) ×3 IMPLANT
TOWEL OR NON WOVEN STRL DISP B (DISPOSABLE) ×3 IMPLANT
YANKAUER SUCT BULB TIP 10FT TU (MISCELLANEOUS) ×3 IMPLANT

## 2016-05-06 NOTE — Anesthesia Procedure Notes (Signed)
Procedure Name: Intubation Date/Time: 05/06/2016 9:19 AM Performed by: Wynonia SoursWALKER, Damean Poffenberger L Pre-anesthesia Checklist: Patient identified, Emergency Drugs available, Suction available, Patient being monitored and Timeout performed Patient Re-evaluated:Patient Re-evaluated prior to inductionOxygen Delivery Method: Circle system utilized Preoxygenation: Pre-oxygenation with 100% oxygen Intubation Type: IV induction Ventilation: Two handed mask ventilation required Laryngoscope Size: Mac and 4 Grade View: Grade I Tube type: Subglottic suction tube Tube size: 7.0 mm Number of attempts: 1 Airway Equipment and Method: Stylet Placement Confirmation: ETT inserted through vocal cords under direct vision,  positive ETCO2,  CO2 detector and breath sounds checked- equal and bilateral Secured at: 21 cm Tube secured with: Tape Dental Injury: Teeth and Oropharynx as per pre-operative assessment  Comments: Patient having small TV with rising ETCO2. Decision made to reintubate patient and send to ICU 1239.

## 2016-05-06 NOTE — Progress Notes (Signed)
PULMONARY / CRITICAL CARE MEDICINE   Name: Shelley [name] MRN: [id number] DOB: 06/17/1965    ADMISSION DATE:  05/03/2016 CONSULTATION DATE:  05/05/2016  REFERRING MD:  Shelley [name], M.D. / General Surgery  CHIEF COMPLAINT:  Acute Respiratory Failure  BRIEF HISTORY:  50 y.o. female patient of Dr. Adria [name]. Known history of OSA/OHS on home BiPAP. Patient is on oxygen chronically at 2 L/m as well as 4 L/m at night while sleeping. Also has a known history of asthma. Patient has a history of nonadherence to noninvasive positive pressure ventilation at home. Patient presented with symptomatic anemia due to bleeding from hemorrhoids. Dr. Randalyn [name] performed hemorrhoidectomy on 11/23. Patient required reintubation and postanesthesia care unit and subsequently transferred to the intensive care unit for further treatment and stabilization postoperatively.  SUBJECTIVE: Patient underwent hemorrhoidectomy today. Patient was subsequently extubated in the PACU but required reintubation. Patient agitated and combative in the ICU after arrival. Heart sedation.  REVIEW OF SYSTEMS:  Unable to obtain given intubation & sedation.  VITAL SIGNS: BP (!) 118/58 (BP Location: Right Arm) Comment: pt lying on left side  Pulse 75   Temp 99.1 F (37.3 C) (Oral)   Resp 18   Ht 5\' 7"  (1.702 m)   Wt 235 lb 14.3 oz (107 kg)   SpO2 100%   BMI 36.95 kg/m   HEMODYNAMICS:    VENTILATOR SETTINGS:    INTAKE / OUTPUT: I/O last 3 completed shifts: In: 243 [P.O.:240; I.V.:3] Out: -   PHYSICAL EXAMINATION: General:  No acute distress. No family at bedside. Eyes closed. Staff preparing to insert Foley catheter. Integument:  Warm & dry. Patchy erythematous rash within the fold under pannus. HEENT:  Moist mucus membranes. No scleral injection or icterus. Endotracheal tube in place.  Cardiovascular:  Regular rate. No edema. No appreciable JVD.  Pulmonary:  Clear with auscultation bilaterally. Symmetric chest wall rise on  ventilator. Abdomen: Soft. Normal bowel sounds. Protuberant. Musculoskeletal:  No joint deformity or effusion appreciated. Neurological: Pupils symmetric and pinpoint. For some glucoses eyes. Currently sedated.  LABS:  BMET  Recent Labs Lab 05/03/16 1535 05/04/16 0335  NA 139 140  K 2.7* 3.4*  CL 88* 88*  CO2 41* 46*  BUN 7 6  CREATININE 0.45 0.46  GLUCOSE 155* 191*    Electrolytes  Recent Labs Lab 05/03/16 1535 05/04/16 0335  CALCIUM 8.5* 8.3*  MG 1.5*  --     CBC  Recent Labs Lab 05/04/16 0335 05/05/16 0830 05/06/16 0408  WBC 4.0 3.3* 4.7  HGB 7.6* 8.9* 8.9*  HCT 27.1* 29.5* 32.6*  PLT 251 242 254    Coag's No results for input(s): APTT, INR in the last 168 hours.  Sepsis Markers No results for input(s): LATICACIDVEN, PROCALCITON, O2SATVEN in the last 168 hours.  ABG No results for input(s): PHART, PCO2ART, PO2ART in the last 168 hours.  Liver Enzymes  Recent Labs Lab 05/03/16 1535  AST 16  ALT 11*  ALKPHOS 63  BILITOT 0.4  ALBUMIN 3.3*    Cardiac Enzymes No results for input(s): TROPONINI, PROBNP in the last 168 hours.  Glucose No results for input(s): GLUCAP in the last 168 hours.  Imaging No results found.   STUDIES:  Port CXR 11/24 >>  MICROBIOLOGY: MRSA PCR 11/24:  Negative   ANTIBIOTICS: Clindamycin 11/24 (periop) Gentamycin 11/24 (periop) Nystatin Powder 11/24 >>  SIGNIFICANT EVENTS: 11/21 - Admit w/ anemia from bleeding hemorrhoids 11/23 - PCCM consulted for pre-op risk assessment 11/24 - hemorrhoidectomy w/  reintubation in PACU and transfer to ICU  LINES/TUBES: OETT 7.0 11/24 >> PIV x2  ASSESSMENT / PLAN:  PULMONARY A: Acute on Chronic Hypoxic Respiratory Failure - Post-op with reintubation in PACU. H/O Asthma H/O OSA/OHS - Nonadherent to BiPAP at home. VS patient. H/O Chronic Hypercarbic Respiratory Failure - ABG on BiPAP 17/11 w/ 4 L/m 7.32/87/141. Chronic Hypoxia - 2 L/m during the day & 4 L/m at  night with BiPAP.  P:   Full Vent Support Targeting Saturation 88-94% Post-intubation Port CXR & ABG Switching to [employer] q6hr scheduled Continuing Budesonide 0.25mg  nebulized bid Continuing Singulair qhs  GASTROINTESTINAL A:   GIB from Hemorrhoids - S/P Hemorrhoidectomy. H/O GERD w/ Hiatal Hernia H/O AVM H/O Diverticulosis  P:   NPO D/C PO Pepcid & Protonix Starting Pepcid IV q12hr Post-op Surgical Care per General Surgery Holding on Tube Feedings  HEMATOLOGIC A:   Anemia - Acute blood loss from hemorrhoids. S/P hemorrhoidectomy.   P:  Trending cell counts daily w/ CBC Holding home Niferex Capsule SCDs Repeat CBC @ 1500 today Transfuse for Hgb <7.0 or active bleeding  CARDIOVASCULAR A:  No acute issues. H/O Hyperlipidemia H/O HTN  P:  Continuous Telemetry Monitoring Vitals per unit protocol Goal MAP >65 Holding home Hyzaar Continuing home Lipitor qhs  RENAL A:   Hypokalemia  Metabolic Alkalosis - Compensatory.  P:   Repeat Electrolytes @ 1500 today with CBC Trending renal function & electrolytes daily Replacing electrolytes as indicated Placing foley catheter to monitor UOP  INFECTIOUS A:   Rash - Likely candida in fold of pannus.   P:   Perioperative Clindamycin & Gentamycin. Monitor for signs of infection. Nystatin Powder TID to rash  ENDOCRINE A:   H/O DM - A1c 6.0 on 03/11/16.   P:   Holding home Metformin Accu-Checks q4hr SSI per Sensitive Algorithm   NEUROLOGIC A:   Sedation on Ventilator Post-Operative Pain H/O Intellectual Handicap  P:   RASS goal: 0 to -1 Precedex gtt Fentanyl IV prn Pain/Discomfort Versed IV prn Sedation Continuing home Zoloft 100mg  VT daily   FAMILY  - Updates: Unable to reach family at any of the numbers listed in the electronic medical record. Message left on the voicemail of Shelley [name] to contact our hospital.  - Inter-disciplinary family meet or Palliative Care meeting due by:  12/1  TODAY'S  SUMMARY:  50 y.o. female with known history of chronic hypoxic and hypercarbic respiratory failure admitted with symptomatic anemia requiring hemorrhoidectomy on 11/24. Patient reintubated in postanesthesia care unit. Targeting saturation 88-94% to prevent a VQ mismatching. Awaiting postintubation portable chest x-ray and ABG. We'll be doing blood work this afternoon to ensure electrolytes are stable and hemoglobin remained stable.  I have spent a total of 36 minutes of critical care time today caring for the patient and reviewing the patient's electronic medical record.  Donna ChristenJennings E. Jamison NeighborNestor, M.D. [facility] Pager:  [phone] After 3pm or if no response, call [phone] 05/06/2016, 9:45 AM

## 2016-05-06 NOTE — Op Note (Signed)
05/06/2016  8:51 AM  PATIENT:  Shelley [name]  50 y.o. female  Patient Care Team: Maurice SmallElaine Griffin, MD as PCP - General (Family Medicine) Jeani HawkingPatrick Hung, MD as Consulting Physician (Gastroenterology) Karie SodaSteven Sakari Raisanen, MD as Consulting Physician (General Surgery) Coralyn HellingVineet Sood, MD as Consulting Physician (Pulmonary Disease) Rodman PickleLuke Aaron Kinsinger, MD as Consulting Physician (General Surgery)  PRE-OPERATIVE DIAGNOSIS:  Bleeding internal hemorrhoids with symptomatic anemia  POST-OPERATIVE DIAGNOSIS:  Bleeding internal hemorrhoids with symptomatic anemia  PROCEDURE:    EXAM UNDER ANESTHESIA HEMORRHOIDECTOMY, HEMORRHOID LIGATION  SURGEON:  Surgeon(s): Karie SodaSteven Lorcan Shelp, MD  ANESTHESIA:   Local field block Anorectal block General  0.25% bupivacaine with epinephrine at the beginning of the case.  Liposomal bupivacaine (Experel) at the end of the case.  EBL:  Total I/O In: -  Out: 150 [Blood:150]  Delay start of Pharmacological VTE agent (>24hrs) due to surgical blood loss or risk of bleeding:  NO  DRAINS: NONE  SPECIMEN:  Source of Specimen:  Internal hemorrhoids  DISPOSITION OF SPECIMEN:  PATHOLOGY  COUNTS:  YES  PLAN OF CARE: Discharge home after PACU  PATIENT DISPOSITION:  PACU - hemodynamically stable.  INDICATION: Pleasant patient with struggles with hemorrhoids.  Not able to be managed in the office despite an improved bowel regimen.  As undergone banding at least three times in several years.  Oxygen requiring with hypoventilation syndrome and sleep apnea.  Initially refractory to surgery.  Has had second by furthering rectal bleeding this year.  He has nonsurgical options have been exhausted and GI bleeding source else were not able to be found, I recommended examination under anesthesia and surgical treatment:  The anatomy & physiology of the anorectal region was discussed.  The pathophysiology of hemorrhoids and differential diagnosis was discussed.  Natural history risks  without surgery was discussed.   I stressed the importance of a bowel regimen to have daily soft bowel movements to minimize progression of disease.  Interventions such as sclerotherapy & banding were discussed.  The patient's symptoms are not adequately controlled by medicines and other non-operative treatments.  I feel the risks & problems of no surgery outweigh the operative risks; therefore, I recommended surgery to treat the hemorrhoids by ligation, pexy, and possible resection.  Risks such as bleeding, infection, need for further treatment, heart attack, death, and other risks were discussed.   I noted a good likelihood this will help address the problem.  Goals of post-operative recovery were discussed as well.  Possibility that this will not correct all symptoms was explained.  Post-operative pain, bleeding, constipation, urinary difficulties, and other problems after surgery were discussed.  We will work to minimize complications.   Educational handouts further explaining the pathology, treatment options, and bowel regimen were given as well.  Questions were answered.  The patient expresses understanding & wishes to proceed with surgery.  She has had increased of failing extubation and need to be on the ventilator but options are exhausted.  The patient feels much workup about proceeding with surgery today    OR FINDINGS:   Inflamed right posterior and right anterior hemorrhoids.  Less so on the left side.  All three problems rather friable and bled easily.  Some external components.  Probable small posterior midline fissure as well but no increased sphincter tone or stricturing.  Moderate cystocele and significant rectocele.  Somewhat redundant floppy rectum but no true procidentia prolapse  DESCRIPTION:   Informed consent was confirmed. Patient underwent general anesthesia without difficulty. Patient was placed into lithotomy  positioning.  The perianal region was prepped and draped in  sterile fashion. Surgical time-out confirmed our plan.  I did digital rectal examination and then transitioned over to anoscopy to get a sense of the anatomy.  Findings noted above.   I proceeded with internal hemorrhoidal ligation in the classic hexagonal pattern.  I started in the right posterior area since the right posterior hemorrhoid was the most inflamed and enlarged.  I used a 2-0 Vicryl suture on a UR-6 needle in a figure-of-eight fashion 5-6 cm proximal to the anal verge.  then proceeded with hemorrhoidectomy and a longitudinal fusiform fashion continue with externally as well, making an effort to preserve the anal verge tissue.  I then ran that  2-0 Vicryl stitch longitudinally more distally to close the hemorrhoidectomy wounds, leaving the last 10 mm open to allow natural drainage. I then tied that stitch down to cause a hemorrhoidopexy.  I did further hemorrhoidal ligation with 2-0 Vicryl on the right lateral aspect.  I did another running suture of the right anterior aspect and excised and inflamed internal hemorrhoid with some external component and closed that down.  I turned attention towards the left side.  I did a running ligation of 2-0 Vicryl in the left anterior and left posterior aspects.  On the left lateral aspect patient still had a slightly enlarged hemorrhoid which I excised a little bit of anoderm and most of the pile and some external component.  I closed that down.   her hemorrhoidal and rectal tissues were quite friable and bled easily.  However once all the stitches were tied down and hemostasis was pretty good.  I redid anoscopy.   At completion of this, all hemorrhoids were  moved or had been reduced into the rectum.  There is no prolapse.  I closed the external hemorrhoidectomy wounds with horizontal mattress interrupted 2-0 chromic suture to assure hemostasis.  At the completion of this, external anatomy looked much more normal.   mild posterior midline fissure but I held  off on doing any sphincterotomy given her irregular bowels and the lack of a stricture.  I repeated anoscopy and examination.  Hemostasis was good.    The patient was extubated.  However she had to be reintubated good due to poor respiratory effort.  She will go to the intensive care unit.  Call discussed with the on-call pulmonary critical care physician, Dr. Jamison NeighborNestor.  Pulmonary critical care will help follow manage the patient postoperatively.  Hopefully she will be more able to be extubated later, waking up more gently.   I had discussed postop care in detail with the patient in the preop holding area.  Instructions for post-operative recovery and prescriptions are written.   There is no family available.  Did not get any answer when trying to call caregiver Laurina BustleDavid Ruth [phone].  We will try again later.    Ardeth SportsmanSteven C. Aala Ransom, M.D., F.A.C.S. Gastrointestinal and Minimally Invasive Surgery [facility] [address] [place], [place] [id number] [phone] Main / Paging

## 2016-05-06 NOTE — Discharge Instructions (Signed)
ANORECTAL SURGERY:  °POST OPERATIVE INSTRUCTIONS ° °###################################################################### ° °EAT °Gradually transition to a high fiber diet with a fiber supplement over the next few weeks after discharge.  Start with a pureed / full liquid diet (see below) ° °WALK °Walk an hour a day.  Control your pain to do that.   ° °CONTROL PAIN °Control pain so that you can walk, sleep, tolerate sneezing/coughing, go up/down stairs. ° °HAVE A BOWEL MOVEMENT DAILY °Keep your bowels regular to avoid problems.  OK to try a laxative to override constipation.  OK to use an antidairrheal to slow down diarrhea.  Call if not better after 2 tries ° °CALL IF YOU HAVE PROBLEMS/CONCERNS °Call if you are still struggling despite following these instructions. °Call if you have concerns not answered by these instructions ° °###################################################################### ° ° ° °1. Take your usually prescribed home medications unless otherwise directed. °2. DIET: Follow a light bland diet the first 24 hours after arrival home, such as soup, liquids, crackers, etc.  Be sure to include lots of fluids daily.  Avoid fast food or heavy meals as your are more likely to get nauseated.  Eat a low fat the next few days after surgery.   °3. PAIN CONTROL: °a. Pain is best controlled by a usual combination of three different methods TOGETHER: °i. Ice/Heat °ii. Over the counter pain medication °iii. Prescription pain medication °b. Most patients will experience some swelling and discomfort in the anus/rectal area. and incisions.  Ice packs or heat (30-60 minutes up to 6 times a day) will help. Use ice for the first few days to help decrease swelling and bruising, then switch to heat such as warm towels, sitz baths, warm baths, etc to help relax tight/sore spots and speed recovery.  Some people prefer to use ice alone, heat alone, alternating between ice & heat.  Experiment to what works for you.   Swelling and bruising can take several weeks to resolve.   °c. It is helpful to take an over-the-counter pain medication regularly for the first few weeks.  Choose one of the following that works best for you: °i. Naproxen (Aleve, etc)  Two 220mg tabs twice a day °ii. Ibuprofen (Advil, etc) Three 200mg tabs four times a day (every meal & bedtime) °iii. Acetaminophen (Tylenol, etc) 500-650mg four times a day (every meal & bedtime) °d. A  prescription for pain medication (such as oxycodone, hydrocodone, etc) should be given to you upon discharge.  Take your pain medication as prescribed.  °i. If you are having problems/concerns with the prescription medicine (does not control pain, nausea, vomiting, rash, itching, etc), please call us [phone] to see if we need to switch you to a different pain medicine that will work better for you and/or control your side effect better. °ii. If you need a refill on your pain medication, please contact your pharmacy.  They will contact our office to request authorization. Prescriptions will not be filled after 5 pm or on week-ends. ° °Use a Sitz Bath 4-8 times a day for relief ° ° °Sitz Bath °A sitz bath is a warm water bath taken in the sitting position that covers only the hips and buttocks. It may be used for either healing or hygiene purposes. Sitz baths are also used to relieve pain, itching, or muscle spasms. The water may contain medicine. Moist heat will help you heal and relax.  °HOME CARE INSTRUCTIONS  °Take 3 to 4 sitz baths a day. °1. Fill the bathtub   half full with warm water. °2. Sit in the water and open the drain a little. °3. Turn on the warm water to keep the tub half full. Keep the water running constantly. °4. Soak in the water for 15 to 20 minutes. °5. After the sitz bath, pat the affected area dry first. ° ° °4. KEEP YOUR BOWELS REGULAR °a. The goal is one bowel movement a day °b. Avoid getting constipated.  Between the surgery and the pain medications, it  is common to experience some constipation.  Increasing fluid intake and taking a fiber supplement (such as Metamucil, Citrucel, FiberCon, MiraLax, etc) 1-2 times a day regularly will usually help prevent this problem from occurring.  A mild laxative (prune juice, Milk of Magnesia, MiraLax, etc) should be taken according to package directions if there are no bowel movements after 48 hours. °c. Watch out for diarrhea.  If you have many loose bowel movements, simplify your diet to bland foods & liquids for a few days.  Stop any stool softeners and decrease your fiber supplement.  Switching to mild anti-diarrheal medications (Kayopectate, Pepto Bismol) can help.  If this worsens or does not improve, please call us. ° °5. Wound Care ° °a. Remove your bandages the day after surgery.  Unless discharge instructions indicate otherwise, leave your bandage dry and in place overnight.  Remove the bandage during your first bowel movement.   °b. Wear an absorbent pad or soft cotton gauze in your underwear as needed to catch any drainage and help keep the area  °c. Keep the area clean and dry.  Bathe / shower every day.  Keep the area clean by showering / bathing over the incision / wound.   It is okay to soak an open wound to help wash it.  Wet wipes or showers / gentle washing after bowel movements is often less traumatic than regular toilet paper. °d. You will often notice bleeding with bowel movements.  This should slow down by the end of the first week of surgery °e. Expect some drainage.  This should slow down, too, by the end of the first week of surgery.  Wear an absorbent pad or soft cotton gauze in your underwear until the drainage stops. ° °6. ACTIVITIES as tolerated:   °a. You may resume regular (light) daily activities beginning the next day--such as daily self-care, walking, climbing stairs--gradually increasing activities as tolerated.  If you can walk 30 minutes without difficulty, it is safe to try more intense  activity such as jogging, treadmill, bicycling, low-impact aerobics, swimming, etc. °b. Save the most intensive and strenuous activity for last such as sit-ups, heavy lifting, contact sports, etc  Refrain from any heavy lifting or straining until you are off narcotics for pain control.   °c. DO NOT PUSH THROUGH PAIN.  Let pain be your guide: If it hurts to do something, don't do it.  Pain is your body warning you to avoid that activity for another week until the pain goes down. °d. You may drive when you are no longer taking prescription pain medication, you can comfortably sit for long periods of time, and you can safely maneuver your car and apply brakes. °e. You may have sexual intercourse when it is comfortable.  °7. FOLLOW UP in our office °a. Please call [facility] at [phone] to set up an appointment to see your surgeon in the office for a follow-up appointment approximately 2 weeks after your surgery. °b. Make sure that you call for   this appointment the day you arrive home to insure a convenient appointment time. 10. IF YOU HAVE DISABILITY OR FAMILY LEAVE FORMS, BRING THEM TO THE OFFICE FOR PROCESSING.  DO NOT GIVE THEM TO YOUR DOCTOR.        WHEN TO CALL US [phone]: 1. Poor pain control 2. Reactions / problems with new medications (rash/itching, nausea, etc)  3. Fever over 101.5 F (38.5 C) 4. Inability to urinate 5. Nausea and/or vomiting 6. Worsening swelling or bruising 7. Continued bleeding from incision. 8. Increased pain, redness, or drainage from the incision  The clinic staff is available to answer your questions during regular business hours (8:30am-5pm).  Please dont hesitate to call and ask to speak to one of our nurses for clinical concerns.   [facility] is always on call at the hospitals   If you have a medical emergency, go to the nearest emergency room or call 911.    [facility] [address], [address],  [place], [place]  [address] ? MAIN: [phone] ? TOLL FREE: [phone] ? FAX [phone] [email or web address]   HEMORRHOIDS  The rectum is the last foot of your colon, and it naturally stretches to hold stool.  Hemorrhoidal piles are natural clusters of blood vessels that help the rectum and anal canal stretch to hold stool and allow bowel movements to eliminate feces.   Hemorrhoids are abnormally swollen blood vessels in the rectum.  Too much pressure in the rectum causes hemorrhoids by forcing blood to stretch and bulge the walls of the veins, sometimes even rupturing them.  Hemorrhoids can become like varicose veins you might see on a person's legs.  Most people will develop a flare of hemorrhoids in their lifetime.  When bulging hemorrhoidal veins are irritated, they can swell, burn, itch, cause pain, and bleed.  Most flares will calm down gradually own within a few weeks.  However, once hemorrhoids are created, they are difficult to get rid of completely and tend to flare more easily than the first flare.   Fortunately, good habits and simple medical treatment usually control hemorrhoids well, and surgery is needed only in severe cases. Types of Hemorrhoids:  Internal hemorrhoids usually don't initially hurt or itch; they are deep inside the rectum and usually have no sensation. If they begin to push out (prolapse), pain and burning can occur.  However, internal hemorrhoids can bleed.  Anal bleeding should not be ignored since bleeding could come from a dangerous source like colorectal cancer, so persistent rectal bleeding should be investigated by a doctor, sometimes with a colonoscopy.  External hemorrhoids cause most of the symptoms - pain, burning, and itching. Nonirritated hemorrhoids can look like small skin tags coming out of the anus.   Thrombosed hemorrhoids can form when a hemorrhoid blood vessel bursts and causes the hemorrhoid to suddenly swell.  A purple blood clot can  form in it and become an excruciatingly painful lump at the anus. Because of these unpleasant symptoms, immediate incision and drainage by a surgeon at an office visit can provide much relief of the pain.    PREVENTION Avoiding the most frequent causes listed below will prevent most cases of hemorrhoids: Constipation Hard stools Diarrhea  Constant sitting  Straining with bowel movements Sitting on the toilet for a long time  Severe coughing  episodes Pregnancy / Childbirth  Heavy Lifting  Sometimes avoiding the above triggers is difficult:  How can you avoid sitting all  day if you have a seated job? Also, we try to avoid coughing and diarrhea, but sometimes its beyond your control.  Still, there are some practical hints to help: Keep the anal and genital area clean.  Moistened tissues such as flushable wet wipes are less irritating than toilet paper.  Using irrigating showers or bottle irrigation washing gently cleans this sensitive area.   Avoid dry toilet paper when cleaning after bowel movements.  Marland Kitchen. Keep the anal and genital area dry.  Lightly pat the rectal area dry.  Avoid rubbing.  Talcum or baby powders can help GET YOUR STOOLS SOFT.   This is the most important way to prevent irritated hemorrhoids.  Hard stools are like sandpaper to the anorectal canal and will cause more problems.  The goal: ONE SOFT BOWEL MOVEMENT A DAY!  BMs from every other day to 3 times a day is a tolerable range Treat coughing, diarrhea and constipation early since irritated hemorrhoids may soon follow.  If your main job activity is seated, always stand or walk during your breaks. Make it a point to stand and walk at least 5 minutes every hour and try to shift frequently in your chair to avoid direct rectal pressure.  Always exhale as you strain or lift. Don't hold your breath.  Do not delay or try to prevent a bowel movement when the urge is present. Exercise regularly (walking or jogging 60 minutes a day) to  stimulate the bowels to move. No reading or other activity while on the toilet. If bowel movements take longer than 5 minutes, you are too constipated. AVOID CONSTIPATION Drink plenty of liquids (1 1/2 to 2 quarts of water and other fluids a day unless fluid restricted for another medical condition). Liquids that contain caffeine (coffee a, tea, soft drinks) can be dehydrating and should be avoided until constipation is controlled. Consider minimizing milk, as dairy products may be constipating. Eat plenty of fiber (30g a day ideal, more if needed).  Fiber is the undigested part of plant food that passes into the colon, acting as natures broom to encourage bowel motility and movement.  Fiber can absorb and hold large amounts of water. This results in a larger, bulkier stool, which is soft and easier to pass.  Eating foods high in fiber - 12 servings - such as  Vegetables: Root (potatoes, carrots, turnips), Leafy green (lettuce, salad greens, celery, spinach), High residue (cabbage, broccoli, etc.) Fruit: Fresh, Dried (prunes, apricots, cherries), Stewed (applesauce)  Whole grain breads, pasta, whole wheat Bran cereals, muffins, etc. Consider adding supplemental bulking fiber which retains large volumes of water: Psyllium ground seeds (native plant from [place])--available as Metamucil, Konsyl, Effersyllium, Per Diem Fiber, or the less expensive generic forms.  Citrucel  (methylcellulose wood fiber) . FiberCon (Polycarbophil) Polyethylene Glycol - and artificial fiber commonly called Miralax or Glycolax.  It is helpful for people with gassy or bloated feelings with regular fiber Flax Seed - a less gassy natural fiber  Laxatives can be useful for a short period if constipation is severe Osmotics (Milk of Magnesia, Fleets Phospho-Soda, Magnesium Citrate)  Stimulants (Senokot,   Castor Oil,  Dulcolax, Ex-Lax)    Laxatives are not a good long-term solution as it can stress the bowels and cause  too much mineral loss and dehydration.   Avoid taking laxatives for more than 7 days in a row.  AVOID DIARRHEA Switch to liquids and simpler foods for a few days to avoid stressing your intestines further. Avoid  dairy products (especially milk & ice cream) for a short time.  The intestines often can lose the ability to digest lactose when stressed. Avoid foods that cause gassiness or bloating.  Typical foods include beans and other legumes, cabbage, broccoli, and dairy foods.  Every person has some sensitivity to other foods, so listen to your body and avoid those foods that trigger problems for you. Adding fiber (Citrucel, Metamucil, FiberCon, Flax seed, Miralax) gradually can help thicken stools by absorbing excess fluid and retrain the intestines to act more normally.  Slowly increase the dose over a few weeks.  Too much fiber too soon can backfire and cause cramping & bloating. Probiotics (such as active yogurt, Align, etc) may help repopulate the intestines and colon with normal bacteria and calm down a sensitive digestive tract.  Most studies show it to be of mild help, though, and such products can be costly. Medicines: Bismuth subsalicylate (ex. Kayopectate, Pepto Bismol) every 30 minutes for up to 6 doses can help control diarrhea.  Avoid if pregnant. Loperamide (Immodium) can slow down diarrhea.  Start with two tablets (4mg  total) first and then try one tablet every 6 hours.  Avoid if you are having fevers or severe pain.  If you are not better or start feeling worse, stop all medicines and call your doctor for advice Call your doctor if you are getting worse or not better.  Sometimes further testing (cultures, endoscopy, X-ray studies, bloodwork, etc) may be needed to help diagnose and treat the cause of the diarrhea.  TROUBLESHOOTING IRREGULAR BOWELS 1) Avoid extremes of bowel movements (no bad constipation/diarrhea) 2) Miralax 17gm mixed in 8oz. water or juice-daily. May use BID as  needed.  3) Gas-x,Phazyme, etc. as needed for gas & bloating.  4) Soft,bland diet. No spicy,greasy,fried foods.  5) Prilosec over-the-counter as needed  6) May hold gluten/wheat products from diet to see if symptoms improve.  7)  May try probiotics (Align, Activa, etc) to help calm the bowels down 7) If symptoms become worse call back immediately.   TREATMENT OF HEMORRHOID FLARE If these preventive measures fail, you must take action right away! Hemorrhoids are one condition that can be mild in the morning and become intolerable by nightfall. Most hemorrhoidal flares take several weeks to calm down.  These suggestions can help: Warm soaks.  This helps more than any topical medication.  Use up to 8 times a day.  Usually sitz baths or sitting in a warm bathtub helps.  Sitting on moist warm towels are helpful.  Switching to ice packs/cool compresses can be helpful  Use a Sitz Bath 4-8 times a day for relief A sitz bath is a warm water bath taken in the sitting position that covers only the hips and buttocks. It may be used for either healing or hygiene purposes. Sitz baths are also used to relieve pain, itching, or muscle spasms. The water may contain medicine. Moist heat will help you heal and relax.  HOME CARE INSTRUCTIONS  Take 3 to 4 sitz baths a day. 6. Fill the bathtub half full with warm water. 7. Sit in the water and open the drain a little. 8. Turn on the warm water to keep the tub half full. Keep the water running constantly. 9. Soak in the water for 15 to 20 minutes. 10. After the sitz bath, pat the affected area dry first. SEEK MEDICAL CARE IF:  You get worse instead of better. Stop the sitz baths if you get worse.  Normalize your bowels.  Extremes of diarrhea or constipation will make hemorrhoids worse.  One soft bowel movement a day is the goal.  Fiber can help get your bowels regular Wet wipes instead of toilet paper Pain control with a NSAID such as ibuprofen (Advil) or  naproxen (Aleve) or acetaminophen (Tylenol) around the clock.  Narcotics are constipating and should be minimized if possible Topical creams contain steroids (bydrocortisone) or local anesthetic (xylocaine) can help make pain and itching more tolerable.   EVALUATION If hemorrhoids are still causing problems, you could benefit by an evaluation by a surgeon.  The surgeon will obtain a history and examine you.  If hemorrhoids are diagnosed, some therapies can be offered in the office, usually with an anoscope into the less sensitive area of the rectum: -injection of hemorrhoids (sclerotherapy) can scar the blood vessels of the swollen/enlarged hemorrhoids to help shrink them down to a more normal size -rubber banding of the enlarged hemorrhoids to help shrink them down to a more normal size -drainage of the blood clot causing a thrombosed hemorrhoid,  to relieve the severe pain   While 90% of the time such problems from hemorrhoids can be managed without preceding to surgery, sometimes the hemorrhoids require a operation to control the problem (uncontrolled bleeding, prolapse, pain, etc.).   This involves being placed under general anesthesia where the surgeon can confirm the diagnosis and remove, suture, or staple the hemorrhoid(s).  Your surgeon can help you treat the problem appropriately.    GETTING TO GOOD BOWEL HEALTH.  ######################################################################  EAT Gradually transition to a high fiber diet with a fiber supplement over the next few weeks after discharge.  Start with a pureed / full liquid diet (see below)  WALK Walk an hour a day.  Control your pain to do that.    HAVE A BOWEL MOVEMENT DAILY Keep your bowels regular to avoid problems.  OK to try a laxative to override constipation.  OK to use an antidairrheal to slow down diarrhea.  Call if not better after 2 tries  CALL IF YOU HAVE PROBLEMS/CONCERNS Call if you are still struggling despite  following these instructions. Call if you have concerns not answered by these instructions  ######################################################################   Irregular bowel habits such as constipation and diarrhea can lead to many problems over time.  Having one soft bowel movement a day is the most important way to prevent further problems.  The anorectal canal is designed to handle stretching and feces to safely manage our ability to get rid of solid waste (feces, poop, stool) out of our body.  BUT, hard constipated stools can act like ripping concrete bricks and diarrhea can be a burning fire to this very sensitive area of our body, causing inflamed hemorrhoids, anal fissures, increasing risk is perirectal abscesses, abdominal pain/bloating, an making irritable bowel worse.      The goal: ONE SOFT BOWEL MOVEMENT A DAY!  To have soft, regular bowel movements:   Drink plenty of fluids, consider 4-6 tall glasses of water a day.    Take plenty of fiber.  Fiber is the undigested part of plant food that passes into the colon, acting s natures broom to encourage bowel motility and movement.  Fiber can absorb and hold large amounts of water. This results in a larger, bulkier stool, which is soft and easier to pass. Work gradually over several weeks up to 6 servings a day of fiber (25g a day even more if needed) in the form  of: o Vegetables -- Root (potatoes, carrots, turnips), leafy green (lettuce, salad greens, celery, spinach), or cooked high residue (cabbage, broccoli, etc) o Fruit -- Fresh (unpeeled skin & pulp), Dried (prunes, apricots, cherries, etc ),  or stewed ( applesauce)  o Whole grain breads, pasta, etc (whole wheat)  o Bran cereals   Bulking Agents -- This type of water-retaining fiber generally is easily obtained each day by one of the following:  o Psyllium bran -- The psyllium plant is remarkable because its ground seeds can retain so much water. This product is available as  Metamucil, Konsyl, Effersyllium, Per Diem Fiber, or the less expensive generic preparation in drug and health food stores. Although labeled a laxative, it really is not a laxative.  o Methylcellulose -- This is another fiber derived from wood which also retains water. It is available as Citrucel. o Polyethylene Glycol - and artificial fiber commonly called Miralax or Glycolax.  It is helpful for people with gassy or bloated feelings with regular fiber o Flax Seed - a less gassy fiber than psyllium  No reading or other relaxing activity while on the toilet. If bowel movements take longer than 5 minutes, you are too constipated  AVOID CONSTIPATION.  High fiber and water intake usually takes care of this.  Sometimes a laxative is needed to stimulate more frequent bowel movements, but   Laxatives are not a good long-term solution as it can wear the colon out.  They can help jump-start bowels if constipated, but should be relied on constantly without discussing with your doctor o Osmotics (Milk of Magnesia, Fleets phosphosoda, Magnesium citrate, MiraLax, GoLytely) are safer than  o Stimulants (Senokot, Castor Oil, Dulcolax, Ex Lax)    o Avoid taking laxatives for more than 7 days in a row.   IF SEVERELY CONSTIPATED, try a Bowel Retraining Program: o Do not use laxatives.  o Eat a diet high in roughage, such as bran cereals and leafy vegetables.  o Drink six (6) ounces of prune or apricot juice each morning.  o Eat two (2) large servings of stewed fruit each day.  o Take one (1) heaping tablespoon of a psyllium-based bulking agent twice a day. Use sugar-free sweetener when possible to avoid excessive calories.  o Eat a normal breakfast.  o Set aside 15 minutes after breakfast to sit on the toilet, but do not strain to have a bowel movement.  o If you do not have a bowel movement by the third day, use an enema and repeat the above steps.   Controlling diarrhea o Switch to liquids and simpler  foods for a few days to avoid stressing your intestines further. o Avoid dairy products (especially milk & ice cream) for a short time.  The intestines often can lose the ability to digest lactose when stressed. o Avoid foods that cause gassiness or bloating.  Typical foods include beans and other legumes, cabbage, broccoli, and dairy foods.  Every person has some sensitivity to other foods, so listen to our body and avoid those foods that trigger problems for you. o Adding fiber (Citrucel, Metamucil, psyllium, Miralax) gradually can help thicken stools by absorbing excess fluid and retrain the intestines to act more normally.  Slowly increase the dose over a few weeks.  Too much fiber too soon can backfire and cause cramping & bloating. o Probiotics (such as active yogurt, Align, etc) may help repopulate the intestines and colon with normal bacteria and calm down a sensitive digestive tract.  Most studies show it to be of mild help, though, and such products can be costly. o Medicines: - Bismuth subsalicylate (ex. Kayopectate, Pepto Bismol) every 30 minutes for up to 6 doses can help control diarrhea.  Avoid if pregnant. - Loperamide (Immodium) can slow down diarrhea.  Start with two tablets (4mg  total) first and then try one tablet every 6 hours.  Avoid if you are having fevers or severe pain.  If you are not better or start feeling worse, stop all medicines and call your doctor for advice o Call your doctor if you are getting worse or not better.  Sometimes further testing (cultures, endoscopy, X-ray studies, bloodwork, etc) may be needed to help diagnose and treat the cause of the diarrhea.  TROUBLESHOOTING IRREGULAR BOWELS 1) Avoid extremes of bowel movements (no bad constipation/diarrhea) 2) Miralax 17gm mixed in 8oz. water or juice-daily. May use BID as needed.  3) Gas-x,Phazyme, etc. as needed for gas & bloating.  4) Soft,bland diet. No spicy,greasy,fried foods.  5) Prilosec over-the-counter  as needed  6) May hold gluten/wheat products from diet to see if symptoms improve.  7)  May try probiotics (Align, Activa, etc) to help calm the bowels down 7) If symptoms become worse call back immediately.

## 2016-05-06 NOTE — Progress Notes (Signed)
[facility]  [address]  [place], [place] [id number] Phone: [phone] FAX: [phone]   Shelley [name] [id number] 10/24/1965  CARE TEAM:  PCP: [name],[name], MD  Outpatient Care Team: Patient Care Team: Maurice SmallElaine Griffin, MD as PCP - General (Family Medicine) Jeani HawkingPatrick Hung, MD as Consulting Physician (Gastroenterology) Karie SodaSteven Ladeidra Borys, MD as Consulting Physician (General Surgery) Coralyn HellingVineet Sood, MD as Consulting Physician (Pulmonary Disease) Rodman PickleLuke Aaron Kinsinger, MD as Consulting Physician (General Surgery)  Inpatient Treatment Team: Treatment Team: Attending Provider: Vassie Lollarlos [name], MD; Technician: Wendelyn Breslowanielle M Floyd, NT; Registered Nurse: Federico FlakeAmber N Melton, RN; Rounding Team: Massie KluverWl3 Triadhosp, MD; Technician: Kieth Brightlyhristopher A Blaine, NT; Registered Nurse: Sherald BargeMaria I de Leon, RN; Consulting Physician: Md Pccm, MD; Rounding Team: Md Pccm, MD; Consulting Physician: Bishop LimboMd Ccs, MD; Consulting Physician: Jeani HawkingPatrick Hung, MD; Registered Nurse: Clifton CustardKatie L Dunn, RN; Registered Nurse: Silvestre GunnerMarissa A Long, RN; Technician: Birdie HopesAlejandra N Soto, NT  Problem List:   Principal Problem:   Acute lower GI bleeding Active Problems:   Cognitive developmental delay   Dependence on supplemental oxygen   Obesity hypoventilation syndrome (HCC)   Bleeding internal hemorrhoids   Obstructive sleep apnea   Chronic respiratory failure (HCC)   Iron deficiency anemia due to chronic blood loss   Hypokalemia   Diabetes mellitus (HCC)   Obesity, Class III, BMI 40-49.9 (morbid obesity) (HCC)   Anxiety   Mental retardation   History of arteriovenous malformation (AVM)   Gastro-esophageal reflux disease without esophagitis   Acute blood loss anemia   Bleeding gastrointestinal   Chronic constipation   AVM (arteriovenous malformation) of colon   Day of Surgery  05/03/2016 - 05/06/2016  Procedure(s): EXAM UNDER ANESTHESIA HEMORRHOIDECTOMY   Assessment  Recurrent bright red blood per  rectum presumed to be hemorrhoidal since proximal evaluations in the past have been negative.  She is ready for surgery.  Plan:  -Patient less anxious and more prepared to proceed with hemorrhoid surgery.  Again went over the indications and risks and benefits.  She agrees to proceed. -VTE prophylaxis- SCDs, etc -mobilize as tolerated to help recovery  Ardeth SportsmanSteven C. Jovon Streetman, M.D., F.A.C.S. Gastrointestinal and Minimally Invasive Surgery [facility] [address] [place], [place] [address] [phone] Main / Paging   05/06/2016  Subjective:  No major bleeding overnight.  Objective:  Vital signs:  Vitals:   05/05/16 0826 05/05/16 1447 05/05/16 2042 05/06/16 0001  BP:  135/71 (!) 118/58   Pulse: 95 80 75   Resp: 17 18 18 18   Temp:  99.2 F (37.3 C) 99.1 F (37.3 C)   TempSrc:  Oral Oral   SpO2: 97% 99% 100%   Weight:      Height:        Last BM Date: 05/05/16  Intake/Output   Yesterday:  11/23 0701 - 11/24 0700 In: 240 [P.O.:240] Out: -  This shift:  No intake/output data recorded.  Bowel function:  Flatus: YES  BM:  YES  Drain: (No drain)   Physical Exam:  General: Pt awake/alert/oriented x4 in No acute distress Eyes: PERRL, normal EOM.  Sclera clear.  No icterus Neuro: CN II-XII intact w/o focal sensory/motor deficits. Lymph: No head/neck/groin lymphadenopathy Psych:  No delerium/psychosis/paranoia.  Calm.  Smiling.  Not tearful. HENT: Normocephalic, Mucus membranes moist.  No thrush Neck: Supple, No tracheal deviation Chest: No chest wall pain w good excursion CV:  Pulses intact.  Regular rhythm MS: Normal AROM mjr joints.  No obvious deformity Abdomen:  Soft.  Nondistended.  Nontender.  No evidence of peritonitis.  No incarcerated hernias. Rectal: No active bleeding.  No prolapse.  No abscess. Ext:  SCDs BLE.  No mjr edema.  No cyanosis Skin: No petechiae / purpura  Results:   Labs: Results for orders placed  or performed during the hospital encounter of 05/03/16 (from the past 48 hour(s))  Prepare RBC     Status: None   Collection Time: 05/04/16  9:00 AM  Result Value Ref Range   Order Confirmation ORDER PROCESSED BY BLOOD BANK   CBC     Status: Abnormal   Collection Time: 05/05/16  8:30 AM  Result Value Ref Range   WBC 3.3 (L) 4.0 - 10.5 K/uL   RBC 3.74 (L) 3.87 - 5.11 MIL/uL   Hemoglobin 8.9 (L) 12.0 - 15.0 g/dL   HCT 14.729.5 (L) 82.936.0 - 56.246.0 %   MCV 78.9 78.0 - 100.0 fL   MCH 23.8 (L) 26.0 - 34.0 pg   MCHC 30.2 30.0 - 36.0 g/dL   RDW 13.020.1 (H) 86.511.5 - 78.415.5 %   Platelets 242 150 - 400 K/uL  Surgical pcr screen     Status: None   Collection Time: 05/06/16 12:02 AM  Result Value Ref Range   MRSA, PCR NEGATIVE NEGATIVE   Staphylococcus aureus NEGATIVE NEGATIVE    Comment:        The Xpert SA Assay (FDA approved for NASAL specimens in patients over 50 years of age), is one component of a comprehensive surveillance program.  Test performance has been validated by [facility] for patients greater than or equal to 50 year old. It is not intended to diagnose infection nor to guide or monitor treatment.   CBC     Status: Abnormal   Collection Time: 05/06/16  4:08 AM  Result Value Ref Range   WBC 4.7 4.0 - 10.5 K/uL   RBC 3.97 3.87 - 5.11 MIL/uL   Hemoglobin 8.9 (L) 12.0 - 15.0 g/dL   HCT 69.632.6 (L) 29.536.0 - 28.446.0 %   MCV 82.1 78.0 - 100.0 fL   MCH 22.4 (L) 26.0 - 34.0 pg   MCHC 27.3 (L) 30.0 - 36.0 g/dL   RDW 13.221.2 (H) 44.011.5 - 10.215.5 %   Platelets 254 150 - 400 K/uL    Imaging / Studies: No results found.  Medications / Allergies: per chart  Antibiotics: Anti-infectives    Start     Dose/Rate Route Frequency Ordered Stop   05/06/16 0600  [MAR Hold]  clindamycin (CLEOCIN) IVPB 900 mg     (MAR Hold since 05/06/16 0729)   900 mg 100 mL/hr over 30 Minutes Intravenous On call to O.R. 05/05/16 1644 05/07/16 0559   05/06/16 0600  [MAR Hold]  gentamicin (GARAMYCIN) 540 mg in dextrose 5 %  100 mL IVPB     (MAR Hold since 05/06/16 0729)   5 mg/kg  107 kg 227 mL/hr over 30 Minutes Intravenous On call to O.R. 05/05/16 1644 05/07/16 0559        Note: Portions of this report may have been transcribed using voice recognition software. Every effort was made to ensure accuracy; however, inadvertent computerized transcription errors may be present.   Any transcriptional errors that result from this process are unintentional.     Ardeth SportsmanSteven C. Lowell Mcgurk, M.D., F.A.C.S. Gastrointestinal and Minimally Invasive Surgery [facility] [address] [place], [place] [address] [phone] Main / Paging   05/06/2016

## 2016-05-06 NOTE — Progress Notes (Signed)
PCCM Attending Chart Rounds: Serum magnesium now noted to be 1.4. Giving magnesium sulfate 4gm IV. Repeat electrolytes with AM labs 11/25.  Donna ChristenJennings E. Jamison NeighborNestor, M.D. [facility] Pager:  [phone] After 3pm or if no response, call [phone] 5:36 PM 05/06/16

## 2016-05-06 NOTE — Anesthesia Postprocedure Evaluation (Signed)
Anesthesia Post Note  Patient: Shelley [name]  Procedure(s) Performed: Procedure(s) (LRB): EXAM UNDER ANESTHESIA (N/A) HEMORRHOIDECTOMY, HEMORRHOID LIGATION (N/A)  Patient location during evaluation: ICU Anesthesia Type: General Level of consciousness: sedated, obtunded/minimal responses and patient remains intubated per anesthesia plan Pain management: pain level controlled Vital Signs Assessment: post-procedure vital signs reviewed and stable Respiratory status: patient remains intubated per anesthesia plan and patient on ventilator - see flowsheet for VS Cardiovascular status: blood pressure returned to baseline and stable Postop Assessment: no signs of nausea or vomiting Anesthetic complications: no Comments: Paiient had elevated EtCO2 levels prior to induction and required aggressive ventilaton in order to maintain somewhat normal levels during procedure. On emergence she was responsive and had had low normal Vt's so attempt at extubation done in OR. She did not tolerate extubation so she was reintubated in the OR and taken to the ICU for post op mechanical ventilation and further evaluation. I suspect she may have underlying hypothyroidism which may have exacerbated her hypoventilation.     Last Vitals:  Vitals:   05/05/16 2042 05/06/16 0001  BP: (!) 118/58   Pulse: 75   Resp: 18 18  Temp: 37.3 C     Last Pain:  Vitals:   05/06/16 0215  TempSrc:   PainSc: Asleep                 Ariba Lehnen A.

## 2016-05-06 NOTE — Progress Notes (Signed)
Initial Nutrition Assessment  DOCUMENTATION CODES:   Obesity unspecified  INTERVENTION:  - If pt unable to be extubated in the next 24 hours, recommend Vital High Protein @ 55 mL/hr with 30 mL Prostat once/day. This regimen will provide 1420 kcal, 131 grams of protein, and 1104 mL free water. - RD will follow-up 11/27.  NUTRITION DIAGNOSIS:   Inadequate oral intake related to inability to eat as evidenced by NPO status.   GOAL:   Provide needs based on ASPEN/SCCM guidelines   MONITOR:   Vent status, Weight trends, Labs, Skin, I & O's  REASON FOR ASSESSMENT:   Ventilator  ASSESSMENT:   50 y.o. female with a history of mild MR, diverticulosis, internal hemorrhoids, cecal AVM, GERD, OHS, OSA, asthma on home oxygen, and chronic anemia who [facility].  Pt seen for new vent. BMI indicates obesity. No OGT or NGT in place at this time. No family or visitors present to provide information from PTA.  Notes indicate that pt had hemorrhoid surgery last week and noted recurrent rectal bleeding PTA. Repeat surgery done this AM and pt remains intubated s/p procedure at this time.   Physical assessment shows no muscle or fat wasting, mild edema to ankles. Per chart review, pt has lost 9 lbs (4% body weight) in the past 6 months which is not significant for time frame.   Patient is currently intubated on ventilator support MV: 9.3 L/min Temp (24hrs), Avg:99.1 F (37.3 C), Min:99.1 F (37.3 C), Max:99.2 F (37.3 C) Propofol: none  Medications reviewed; 20 mg IV Pepcid BID, sliding scale Novolog. Labs reviewed; CBGs: 170 and 179 mg/dL today, K: 3.4 mmol/L, Cl: 88 mmol/L, Ca: 8.3 mg/dL, Mg: 1.5 mg/dL.  Drip: Precedex @ 0.4 mcg/kg/hr.    Diet Order:  Diet - low sodium heart healthy Diet NPO time specified  Skin:  Wound (see comment) (Rectal incision from hemorrhoid surgery)  Last BM:  11/23  Height:   Ht Readings from Last 1 Encounters:   05/06/16 5\' 7"  (1.702 m)    Weight:   Wt Readings from Last 1 Encounters:  05/03/16 235 lb 14.3 oz (107 kg)    Ideal Body Weight:  61.36 kg  BMI:  Body mass index is 36.95 kg/m.  Estimated Nutritional Needs:   Kcal:  [id number] (11-14 kcal/kg actual body weight)  Protein:  123 grams (2 grams/kg IBW)  Fluid:  >/= 1.5 L/day  EDUCATION NEEDS:   No education needs identified at this time    Trenton GammonJessica Siedah Sedor, MS, RD, LDN, CNSC Inpatient Clinical Dietitian Pager # [phone] After hours/weekend pager # [phone]

## 2016-05-06 NOTE — Progress Notes (Signed)
Pharmacy Antibiotic Note  Shelley [name] is a 50 y.o. female admitted on 05/03/2016 with bleeding hemorrhoids s/p hemorrhoidectomy on 11/24.  PMH includes chronic hypoxic and hypercarbic respiratory failure, asthma, and OSA.  Postop she required re-intubation, and has now developed fever, and suspected sepsis.  Pharmacy has been consulted for Vancomycin and Zosyn dosing.  SCr 0.64, CrCl > 100 ml/min  Plan:  Zosyn 3.375g IV Q8H infused over 4hrs.  Vancomycin 2g IV once, then 1g IV q8h.  Measure Vanc trough at steady state.  Follow up renal fxn, culture results, and clinical course.   Height: 5\' 7"  (170.2 cm) Weight: 235 lb 14.3 oz (107 kg) IBW/kg (Calculated) : 61.6  Temp (24hrs), Avg:100.3 F (37.9 C), Min:99.1 F (37.3 C), Max:102.7 F (39.3 C)   Recent Labs Lab 05/03/16 1535 05/04/16 0335 05/05/16 0830 05/06/16 0408 05/06/16 1447  WBC 4.3 4.0 3.3* 4.7 4.1  CREATININE 0.45 0.46  --   --  0.64    Estimated Creatinine Clearance: 106 mL/min (by C-G formula based on SCr of 0.64 mg/dL).    Allergies  Allergen Reactions  . Fish Allergy Swelling  . Pepto-Bismol [Bismuth Subsalicylate] Nausea And Vomiting  . Cortisone Itching and Rash    Injections and cream only.  PO ok.  . Trichophyton Itching    Also sneezing accompanying as well    Antimicrobials this admission: 11/24 Zosyn >>  11/24 Vancomycin >>   Dose adjustments this admission:   Microbiology results: 11/24 BCx: sent 11/24 UCx: sent  11/24 Sputum: sent  11/24 MRSA PCR, surgical screen: negative  Thank you for allowing pharmacy to be a part of this patient's care.  Lynann Beaverhristine Nelsie Domino PharmD, BCPS Pager [phone] 05/06/2016 8:25 PM

## 2016-05-06 NOTE — Progress Notes (Signed)
Date:  May 06, 2016 Chart reviewed for concurrent status and case management needs. Will continue to follow patient progress.  oxygen at 100% Discharge Planning: following for needs Expected discharge date: [id number] Marcelle SmilingRhonda Neshawn Aird, BSN, YermoRN3, [place]CCM   [phone]

## 2016-05-06 NOTE — Progress Notes (Signed)
eLink Physician-Brief Progress Note Patient Name: Shelley [name] A Brashear DOB: 04/27/1966 MRN: [id number]   Date of Service  05/06/2016  HPI/Events of Note  Temp 102.63F  eICU Interventions  Check ccbc, pct, sepsis and pan culture Start vanc and zosyn Ice cooling - if Not then try IV tylenol     Intervention Category Major Interventions: Other:  Aleene Swanner 05/06/2016, 8:14 PM

## 2016-05-06 NOTE — Anesthesia Procedure Notes (Signed)
Procedure Name: Intubation Date/Time: 05/06/2016 7:43 AM Performed by: Wynonia SoursWALKER, Antwaine Boomhower L Pre-anesthesia Checklist: Patient identified, Emergency Drugs available, Suction available, Patient being monitored and Timeout performed Patient Re-evaluated:Patient Re-evaluated prior to inductionOxygen Delivery Method: Circle system utilized Preoxygenation: Pre-oxygenation with 100% oxygen Intubation Type: IV induction, Cricoid Pressure applied and Rapid sequence Ventilation: Mask ventilation without difficulty Laryngoscope Size: Mac and 4 Grade View: Grade I Tube type: Oral Tube size: 7.5 mm Number of attempts: 1 Airway Equipment and Method: Stylet Placement Confirmation: ETT inserted through vocal cords under direct vision,  positive ETCO2,  CO2 detector and breath sounds checked- equal and bilateral Secured at: 21 cm Tube secured with: Tape Dental Injury: Teeth and Oropharynx as per pre-operative assessment

## 2016-05-06 NOTE — Progress Notes (Signed)
Patient was took to surgery early morning for hemorrhoidectomy and after surgery had difficulty been extubated. Hemodynamically stable, but on ventilator. PCCM has taken over for now due to needs of ventilator and resp failure. CCS will continue following for post operative recommendations. Will resume care once off ventilator and stable to be transfer back to hospitalist service.  Vassie LollMadera, Bronwen Pendergraft [phone]

## 2016-05-06 NOTE — Transfer of Care (Signed)
Immediate Anesthesia Transfer of Care Note  Patient: Shelley [name]  Procedure(s) Performed: Procedure(s): EXAM UNDER ANESTHESIA (N/A) HEMORRHOIDECTOMY, HEMORRHOID LIGATION (N/A)  Patient Location: PACU and ICU  Anesthesia Type:General  Level of Consciousness: Patient remains intubated per anesthesia plan  Airway & Oxygen Therapy: Patient re-intubated and Patient placed on Ventilator (see vital sign flow sheet for setting)  Post-op Assessment: Report given to RN  Post vital signs: Reviewed and stable  Last Vitals:  Vitals:   05/05/16 2042 05/06/16 0001  BP: (!) 118/58   Pulse: 75   Resp: 18 18  Temp: 37.3 C     Last Pain:  Vitals:   05/06/16 0215  TempSrc:   PainSc: Asleep         Complications: No apparent anesthesia complications

## 2016-05-07 ENCOUNTER — Inpatient Hospital Stay (HOSPITAL_COMMUNITY): Payer: Medicare Other

## 2016-05-07 DIAGNOSIS — J9622 Acute and chronic respiratory failure with hypercapnia: Secondary | ICD-10-CM

## 2016-05-07 DIAGNOSIS — D62 Acute posthemorrhagic anemia: Secondary | ICD-10-CM

## 2016-05-07 DIAGNOSIS — R509 Fever, unspecified: Secondary | ICD-10-CM

## 2016-05-07 LAB — BLOOD GAS, ARTERIAL
ACID-BASE EXCESS: 13.6 mmol/L — AB (ref 0.0–2.0)
BICARBONATE: 37.8 mmol/L — AB (ref 20.0–28.0)
DRAWN BY: 270211
FIO2: 0.4
LHR: 16 {breaths}/min
MECHVT: 500 mL
O2 Saturation: 95.2 %
PEEP/CPAP: 5 cmH2O
Patient temperature: 98.6
pCO2 arterial: 45.4 mmHg (ref 32.0–48.0)
pH, Arterial: 7.53 — ABNORMAL HIGH (ref 7.350–7.450)
pO2, Arterial: 73.1 mmHg — ABNORMAL LOW (ref 83.0–108.0)

## 2016-05-07 LAB — GLUCOSE, CAPILLARY
GLUCOSE-CAPILLARY: 159 mg/dL — AB (ref 65–99)
GLUCOSE-CAPILLARY: 170 mg/dL — AB (ref 65–99)
GLUCOSE-CAPILLARY: 111 mg/dL — AB (ref 65–99)
Glucose-Capillary: 204 mg/dL — ABNORMAL HIGH (ref 65–99)
Glucose-Capillary: 180 mg/dL — ABNORMAL HIGH (ref 65–99)

## 2016-05-07 LAB — CBC WITH DIFFERENTIAL/PLATELET
Basophils Absolute: 0 10*3/uL (ref 0.0–0.1)
Basophils Relative: 0 %
EOS ABS: 0 10*3/uL (ref 0.0–0.7)
EOS PCT: 0 %
HEMATOCRIT: 27.8 % — AB (ref 36.0–46.0)
Hemoglobin: 8 g/dL — ABNORMAL LOW (ref 12.0–15.0)
LYMPHS ABS: 2.3 10*3/uL (ref 0.7–4.0)
LYMPHS PCT: 38 %
MCH: 22.9 pg — ABNORMAL LOW (ref 26.0–34.0)
MCHC: 28.8 g/dL — AB (ref 30.0–36.0)
MCV: 79.7 fL (ref 78.0–100.0)
MONO ABS: 0.7 10*3/uL (ref 0.1–1.0)
Monocytes Relative: 12 %
Neutro Abs: 3.1 10*3/uL (ref 1.7–7.7)
Neutrophils Relative %: 51 %
PLATELETS: 253 10*3/uL (ref 150–400)
RBC: 3.49 MIL/uL — AB (ref 3.87–5.11)
RDW: 22.2 % — AB (ref 11.5–15.5)
WBC: 6 10*3/uL (ref 4.0–10.5)

## 2016-05-07 LAB — BASIC METABOLIC PANEL
ANION GAP: 8 (ref 5–15)
BUN: 12 mg/dL (ref 6–20)
CALCIUM: 8.1 mg/dL — AB (ref 8.9–10.3)
CO2: 38 mmol/L — AB (ref 22–32)
CREATININE: 0.71 mg/dL (ref 0.44–1.00)
Chloride: 91 mmol/L — ABNORMAL LOW (ref 101–111)
GFR calc Af Amer: >60 mL/min (ref >60)
GLUCOSE: 201 mg/dL — AB (ref 65–99)
Potassium: 3.5 mmol/L (ref 3.5–5.1)
Sodium: 137 mmol/L (ref 135–145)

## 2016-05-07 LAB — RENAL FUNCTION PANEL
Albumin: 2.7 g/dL — ABNORMAL LOW (ref 3.5–5.0)
Anion gap: 10 (ref 5–15)
BUN: 12 mg/dL (ref 6–20)
CHLORIDE: 90 mmol/L — AB (ref 101–111)
CO2: 36 mmol/L — AB (ref 22–32)
CREATININE: 0.68 mg/dL (ref 0.44–1.00)
Calcium: 8 mg/dL — ABNORMAL LOW (ref 8.9–10.3)
GFR calc Af Amer: >60 mL/min (ref >60)
GFR calc non Af Amer: >60 mL/min (ref >60)
GLUCOSE: 197 mg/dL — AB (ref 65–99)
POTASSIUM: 3.5 mmol/L (ref 3.5–5.1)
Phosphorus: 3.1 mg/dL (ref 2.5–4.6)
Sodium: 136 mmol/L (ref 135–145)

## 2016-05-07 LAB — TSH: TSH: 0.32 u[IU]/mL — AB (ref 0.350–4.500)

## 2016-05-07 LAB — PROCALCITONIN: Procalcitonin: <0.1 ng/mL

## 2016-05-07 LAB — MAGNESIUM: Magnesium: 2.4 mg/dL (ref 1.7–2.4)

## 2016-05-07 LAB — T4, FREE: FREE T4: 1.38 ng/dL — AB (ref 0.61–1.12)

## 2016-05-07 MED ORDER — SODIUM CHLORIDE 0.9 % IV BOLUS (SEPSIS)
500.0000 mL | Freq: Once | INTRAVENOUS | Status: AC
  Administered 2016-05-07: 500 mL via INTRAVENOUS

## 2016-05-07 MED ORDER — SODIUM CHLORIDE 0.9 % IV SOLN
600.0000 mg | Freq: Once | INTRAVENOUS | Status: AC
  Administered 2016-05-07: 600 mg via INTRAVENOUS
  Filled 2016-05-07: qty 6

## 2016-05-07 MED ORDER — INSULIN ASPART 100 UNIT/ML ~~LOC~~ SOLN
0.0000 [IU] | SUBCUTANEOUS | Status: DC
  Administered 2016-05-07 (×2): 3 [IU] via SUBCUTANEOUS
  Administered 2016-05-08 (×2): 2 [IU] via SUBCUTANEOUS
  Administered 2016-05-08: 3 [IU] via SUBCUTANEOUS
  Administered 2016-05-08: 2 [IU] via SUBCUTANEOUS
  Administered 2016-05-08 – 2016-05-09 (×2): 3 [IU] via SUBCUTANEOUS
  Administered 2016-05-09 (×3): 2 [IU] via SUBCUTANEOUS

## 2016-05-07 MED ORDER — FUROSEMIDE 10 MG/ML IJ SOLN
20.0000 mg | Freq: Once | INTRAMUSCULAR | Status: AC
  Administered 2016-05-07: 20 mg via INTRAVENOUS
  Filled 2016-05-07: qty 2

## 2016-05-07 NOTE — Progress Notes (Signed)
[address] [name], [name] [id number] Phone: [phone] FAX: [place] [phone] June 09, 1966  CARE TEAM:  PCP: Osborne Casco, MD  Outpatient Care Team: Patient Care Team: Kelton Pillar, MD as PCP - General (Family Medicine) Carol Ada, MD as Consulting Physician (Gastroenterology) Michael Boston, MD as Consulting Physician (General Surgery) Chesley Mires, MD as Consulting Physician (Pulmonary Disease) Mickeal Skinner, MD as Consulting Physician (General Surgery)  Inpatient Treatment Team: Treatment Team: Attending Provider: Javier Glazier, MD; Technician: Lauro Regulus, NT; Registered Nurse: Hermina Staggers, RN; Technician: Emmaline Kluver, NT; Registered Nurse: Audie Box, RN; Consulting Physician: Md Pccm, MD; Consulting Physician: Nolon Nations, MD; Consulting Physician: Carol Ada, MD; Registered Nurse: Grafton Folk, RN; Consulting Physician: Javier Glazier, MD  Problem List:   Principal Problem:   Acute lower GI bleeding Active Problems:   Cognitive developmental delay   Dependence on supplemental oxygen   Obesity hypoventilation syndrome ([name])   Bleeding internal hemorrhoids   Obstructive sleep apnea   Chronic respiratory failure (HCC)   Iron deficiency anemia due to chronic blood loss   Hypokalemia   Diabetes mellitus ([name])   Obesity, Class III, BMI 40-49.9 (morbid obesity) ([name])   Anxiety   Mental retardation   History of arteriovenous malformation (AVM)   Gastro-esophageal reflux disease without esophagitis   Acute blood loss anemia   Bleeding gastrointestinal   Chronic constipation   AVM (arteriovenous malformation) of colon   1 Day Post-Op  05/06/2016  POST-OPERATIVE DIAGNOSIS:  Bleeding internal hemorrhoids with symptomatic anemia  PROCEDURE:    EXAM UNDER ANESTHESIA HEMORRHOIDECTOMY, HEMORRHOID LIGATION  SURGEON:  Surgeon(s): Michael Boston,  MD  OR FINDINGS:   Inflamed right posterior and right anterior hemorrhoids.  Less so on the left side.  All three problems rather friable and bled easily.  Some external components.  Probable small posterior midline fissure as well but no increased sphincter tone or stricturing.  Moderate cystocele and significant rectocele.  Somewhat redundant floppy rectum but no true procidentia prolapse    Assessment  Fair status post hemorrhoidectomy and hemorrhoidal ligation for recurrent major rectal bleeding  Vent dependent.  Plan:  -Vent support per critical care.  See if she is more extubatable.  Challenge with her body habitus and hypoventilation syndrome.  -once extubated, adv diet from clears to as tolerated  Pain control and bowel regimen as tolerated.  Expect intermittent rectal bleeding but hopefully will taper off over the next week or so.  Minimize anticoagulation the next 48 hours to minimize further rectal bleeding.  SCDs.    [name], M.D., F.A.C.S. Gastrointestinal and Minimally Invasive Surgery [facility] [address] [place], [place] [address] [phone] Main / Paging   05/07/2016  Subjective:  No major bleeding overnight. Temp spike last night More alert on vent   Objective:  Vital signs:  Vitals:   05/07/16 0330 05/07/16 0400 05/07/16 0500 05/07/16 0600  BP:  131/71 (!) 116/93 115/85  Pulse:  75 75 71  Resp:  _0 Temp:  (!) 102.7 F (39.3 C)    TempSrc:  Oral    SpO2: 95% 95% 94% 96%  Weight:      Height:        Last BM Date: 05/05/16  Intake/Output   Yesterday:  11/24 0701 - 11/25 0700 In: 3071.3 [I.V.:1715.3; IV Piggyback:1356] Out: 8786 [Urine:1580; Blood:150]  This shift:  No intake/output data recorded.  Bowel function:  Flatus: YES  BM:  YES  Drain: (No drain)   Physical Exam:  General: Pt awake/alert/oriented x4 in No acute distress Eyes: PERRL, normal EOM.  Sclera  clear.  No icterus Neuro: CN II-XII intact w/o focal sensory/motor deficits. Lymph: No head/neck/groin lymphadenopathy Psych:  No delerium/psychosis/paranoia.  Calm.  Follows commands HENT: Normocephalic, Mucus membranes moist.  No thrush Neck: Supple, No tracheal deviation Chest: No chest wall pain w good excursion CV:  Pulses intact.  Regular rhythm MS: Normal AROM mjr joints.  No obvious deformity Abdomen: Soft.  Nondistended.  Nontender.  No evidence of peritonitis.  No incarcerated hernias. Rectal: No active bleeding.   Ext:  SCDs BLE.  No mjr edema.  No cyanosis Skin: No petechiae / purpura  Results:   Labs: Results for orders placed or performed during the hospital encounter of 05/03/16 (from the past 48 hour(s))  CBC     Status: Abnormal   Collection Time: 05/05/16  8:30 AM  Result Value Ref Range   WBC 3.3 (L) 4.0 - 10.5 K/uL   RBC 3.74 (L) 3.87 - 5.11 MIL/uL   Hemoglobin 8.9 (L) 12.0 - 15.0 g/dL   HCT 29.5 (L) 36.0 - 46.0 %   MCV 78.9 78.0 - 100.0 fL   MCH 23.8 (L) 26.0 - 34.0 pg   MCHC 30.2 30.0 - 36.0 g/dL   RDW 20.1 (H) 11.5 - 15.5 %   Platelets 242 150 - 400 K/uL  Surgical pcr screen     Status: None   Collection Time: 05/06/16 12:02 AM  Result Value Ref Range   MRSA, PCR NEGATIVE NEGATIVE   Staphylococcus aureus NEGATIVE NEGATIVE    Comment:        The Xpert SA Assay (FDA approved for NASAL specimens in patients over 50 years of age), is one component of a comprehensive surveillance program.  Test performance has been validated by [facility] for patients greater than or equal to 50 year old. It is not intended to diagnose infection nor to guide or monitor treatment.   CBC     Status: Abnormal   Collection Time: 05/06/16  4:08 AM  Result Value Ref Range   WBC 4.7 4.0 - 10.5 K/uL   RBC 3.97 3.87 - 5.11 MIL/uL   Hemoglobin 8.9 (L) 12.0 - 15.0 g/dL   HCT 32.6 (L) 36.0 - 46.0 %   MCV 82.1 78.0 - 100.0 fL   MCH 22.4 (L) 26.0 - 34.0 pg   MCHC 27.3 (L)  30.0 - 36.0 g/dL   RDW 21.2 (H) 11.5 - 15.5 %   Platelets 254 150 - 400 K/uL  Glucose, capillary     Status: Abnormal   Collection Time: 05/06/16 10:38 AM  Result Value Ref Range   Glucose-Capillary 170 (H) 65 - 99 mg/dL  Draw ABG 1 hour after initiation of ventilator     Status: Abnormal   Collection Time: 05/06/16 10:55 AM  Result Value Ref Range   FIO2 100.00    Delivery systems VENTILATOR    Mode PRESSURE REGULATED VOLUME CONTROL    VT 500 mL   LHR 16 resp/min   Peep/cpap 5.0 cm H20   pH, Arterial 7.498 (H) 7.350 - 7.450   pCO2 arterial 58.5 (H) 32.0 - 48.0 mmHg   pO2, Arterial 267 (H) 83.0 - 108.0 mmHg   Bicarbonate 45.0 (H) 20.0 - 28.0 mmol/L   Acid-Base Excess 18.6 (H) 0.0 -  2.0 mmol/L   O2 Saturation 99.9 %   Patient temperature 37.0    Collection site RIGHT RADIAL    Drawn by [phone]    Sample type ARTERIAL DRAW    Allens test (pass/fail) PASS PASS  Glucose, capillary     Status: Abnormal   Collection Time: 05/06/16 11:48 AM  Result Value Ref Range   Glucose-Capillary 179 (H) 65 - 99 mg/dL  CBC     Status: Abnormal   Collection Time: 05/06/16  2:47 PM  Result Value Ref Range   WBC 4.1 4.0 - 10.5 K/uL   RBC 3.66 (L) 3.87 - 5.11 MIL/uL   Hemoglobin 8.4 (L) 12.0 - 15.0 g/dL   HCT 29.2 (L) 36.0 - 46.0 %   MCV 79.8 78.0 - 100.0 fL   MCH 23.0 (L) 26.0 - 34.0 pg   MCHC 28.8 (L) 30.0 - 36.0 g/dL   RDW 21.3 (H) 11.5 - 15.5 %   Platelets 257 150 - 400 K/uL  Magnesium     Status: Abnormal   Collection Time: 05/06/16  2:47 PM  Result Value Ref Range   Magnesium 1.4 (L) 1.7 - 2.4 mg/dL  Renal function panel     Status: Abnormal   Collection Time: 05/06/16  2:47 PM  Result Value Ref Range   Sodium 139 135 - 145 mmol/L   Potassium 3.5 3.5 - 5.1 mmol/L   Chloride 88 (L) 101 - 111 mmol/L   CO2 40 (H) 22 - 32 mmol/L   Glucose, Bld 187 (H) 65 - 99 mg/dL   BUN 14 6 - 20 mg/dL   Creatinine, Ser 0.64 0.44 - 1.00 mg/dL   Calcium 8.3 (L) 8.9 - 10.3 mg/dL   Phosphorus 2.1 (L)  2.5 - 4.6 mg/dL   Albumin 2.9 (L) 3.5 - 5.0 g/dL   GFR calc non Af Amer >60 >60 mL/min   GFR calc Af Amer >60 >60 mL/min    Comment: (NOTE) The eGFR has been calculated using the CKD EPI equation. This calculation has not been validated in all clinical situations. eGFR's persistently <60 mL/min signify possible Chronic Kidney Disease.    Anion gap 11 5 - 15  Glucose, capillary     Status: Abnormal   Collection Time: 05/06/16  5:07 PM  Result Value Ref Range   Glucose-Capillary 202 (H) 65 - 99 mg/dL  Glucose, capillary     Status: Abnormal   Collection Time: 05/06/16  8:02 PM  Result Value Ref Range   Glucose-Capillary 200 (H) 65 - 99 mg/dL  Lactic acid, plasma     Status: None   Collection Time: 05/06/16  9:00 PM  Result Value Ref Range   Lactic Acid, Venous 1.8 0.5 - 1.9 mmol/L  Procalcitonin - Baseline     Status: None   Collection Time: 05/06/16  9:00 PM  Result Value Ref Range   Procalcitonin <0.10 ng/mL    Comment:        Interpretation: PCT (Procalcitonin) <= 0.5 ng/mL: Systemic infection (sepsis) is not likely. Local bacterial infection is possible. (NOTE)         ICU PCT Algorithm               Non ICU PCT Algorithm    ----------------------------     ------------------------------         PCT < 0.25 ng/mL                 PCT < 0.1 ng/mL  Stopping of antibiotics            Stopping of antibiotics       strongly encouraged.               strongly encouraged.    ----------------------------     ------------------------------       PCT level decrease by               PCT < 0.25 ng/mL       >= 80% from peak PCT       OR PCT 0.25 - 0.5 ng/mL          Stopping of antibiotics                                             encouraged.     Stopping of antibiotics           encouraged.    ----------------------------     ------------------------------       PCT level decrease by              PCT >= 0.25 ng/mL       < 80% from peak PCT        AND PCT >= 0.5 ng/mL             Continuin g antibiotics                                              encouraged.       Continuing antibiotics            encouraged.    ----------------------------     ------------------------------     PCT level increase compared          PCT > 0.5 ng/mL         with peak PCT AND          PCT >= 0.5 ng/mL             Escalation of antibiotics                                          strongly encouraged.      Escalation of antibiotics        strongly encouraged.   CBC with Differential/Platelet     Status: Abnormal   Collection Time: 05/06/16  9:00 PM  Result Value Ref Range   WBC 5.1 4.0 - 10.5 K/uL   RBC 3.77 (L) 3.87 - 5.11 MIL/uL   Hemoglobin 8.6 (L) 12.0 - 15.0 g/dL   HCT 29.7 (L) 36.0 - 46.0 %   MCV 78.8 78.0 - 100.0 fL   MCH 22.8 (L) 26.0 - 34.0 pg   MCHC 29.0 (L) 30.0 - 36.0 g/dL   RDW 21.6 (H) 11.5 - 15.5 %   Platelets 263 150 - 400 K/uL   Neutrophils Relative % 56 %   Neutro Abs 2.9 1.7 - 7.7 K/uL   Lymphocytes Relative 33 %   Lymphs Abs 1.7 0.7 - 4.0 K/uL   Monocytes Relative 12 %   Monocytes Absolute 0.6 0.1 - 1.0 K/uL   Eosinophils Relative 0 %  Eosinophils Absolute 0.0 0.0 - 0.7 K/uL   Basophils Relative 0 %   Basophils Absolute 0.0 0.0 - 0.1 K/uL   RBC Morphology ELLIPTOCYTES   Glucose, capillary     Status: Abnormal   Collection Time: 05/06/16 11:54 PM  Result Value Ref Range   Glucose-Capillary 217 (H) 65 - 99 mg/dL  CBC with Differential/Platelet     Status: Abnormal   Collection Time: 05/07/16  4:01 AM  Result Value Ref Range   WBC 6.0 4.0 - 10.5 K/uL   RBC 3.49 (L) 3.87 - 5.11 MIL/uL   Hemoglobin 8.0 (L) 12.0 - 15.0 g/dL   HCT 27.8 (L) 36.0 - 46.0 %   MCV 79.7 78.0 - 100.0 fL   MCH 22.9 (L) 26.0 - 34.0 pg   MCHC 28.8 (L) 30.0 - 36.0 g/dL   RDW 22.2 (H) 11.5 - 15.5 %   Platelets 253 150 - 400 K/uL   Neutrophils Relative % 51 %   Neutro Abs 3.1 1.7 - 7.7 K/uL   Lymphocytes Relative 38 %   Lymphs Abs 2.3 0.7 - 4.0 K/uL   Monocytes Relative  12 %   Monocytes Absolute 0.7 0.1 - 1.0 K/uL   Eosinophils Relative 0 %   Eosinophils Absolute 0.0 0.0 - 0.7 K/uL   Basophils Relative 0 %   Basophils Absolute 0.0 0.0 - 0.1 K/uL   RBC Morphology ELLIPTOCYTES   Renal function panel     Status: Abnormal   Collection Time: 05/07/16  4:01 AM  Result Value Ref Range   Sodium 136 135 - 145 mmol/L   Potassium 3.5 3.5 - 5.1 mmol/L   Chloride 90 (L) 101 - 111 mmol/L   CO2 36 (H) 22 - 32 mmol/L   Glucose, Bld 197 (H) 65 - 99 mg/dL   BUN 12 6 - 20 mg/dL   Creatinine, Ser 0.68 0.44 - 1.00 mg/dL   Calcium 8.0 (L) 8.9 - 10.3 mg/dL   Phosphorus 3.1 2.5 - 4.6 mg/dL   Albumin 2.7 (L) 3.5 - 5.0 g/dL   GFR calc non Af Amer >60 >60 mL/min   GFR calc Af Amer >60 >60 mL/min    Comment: (NOTE) The eGFR has been calculated using the CKD EPI equation. This calculation has not been validated in all clinical situations. eGFR's persistently <60 mL/min signify possible Chronic Kidney Disease.    Anion gap 10 5 - 15  Magnesium     Status: None   Collection Time: 05/07/16  4:01 AM  Result Value Ref Range   Magnesium 2.4 1.7 - 2.4 mg/dL  TSH     Status: Abnormal   Collection Time: 05/07/16  4:01 AM  Result Value Ref Range   TSH 0.320 (L) 0.350 - 4.500 uIU/mL    Comment: Performed by a 3rd Generation assay with a functional sensitivity of <=0.01 uIU/mL.  Basic metabolic panel     Status: Abnormal   Collection Time: 05/07/16  4:01 AM  Result Value Ref Range   Sodium 137 135 - 145 mmol/L   Potassium 3.5 3.5 - 5.1 mmol/L   Chloride 91 (L) 101 - 111 mmol/L   CO2 38 (H) 22 - 32 mmol/L   Glucose, Bld 201 (H) 65 - 99 mg/dL   BUN 12 6 - 20 mg/dL   Creatinine, Ser 0.71 0.44 - 1.00 mg/dL   Calcium 8.1 (L) 8.9 - 10.3 mg/dL   GFR calc non Af Amer >60 >60 mL/min   GFR calc Af Amer >60 >  60 mL/min    Comment: (NOTE) The eGFR has been calculated using the CKD EPI equation. This calculation has not been validated in all clinical situations. eGFR's persistently  <60 mL/min signify possible Chronic Kidney Disease.    Anion gap 8 5 - 15  Procalcitonin     Status: None   Collection Time: 05/07/16  4:01 AM  Result Value Ref Range   Procalcitonin <0.10 ng/mL    Comment:        Interpretation: PCT (Procalcitonin) <= 0.5 ng/mL: Systemic infection (sepsis) is not likely. Local bacterial infection is possible. (NOTE)         ICU PCT Algorithm               Non ICU PCT Algorithm    ----------------------------     ------------------------------         PCT < 0.25 ng/mL                 PCT < 0.1 ng/mL     Stopping of antibiotics            Stopping of antibiotics       strongly encouraged.               strongly encouraged.    ----------------------------     ------------------------------       PCT level decrease by               PCT < 0.25 ng/mL       >= 80% from peak PCT       OR PCT 0.25 - 0.5 ng/mL          Stopping of antibiotics                                             encouraged.     Stopping of antibiotics           encouraged.    ----------------------------     ------------------------------       PCT level decrease by              PCT >= 0.25 ng/mL       < 80% from peak PCT        AND PCT >= 0.5 ng/mL            Continuin g antibiotics                                              encouraged.       Continuing antibiotics            encouraged.    ----------------------------     ------------------------------     PCT level increase compared          PCT > 0.5 ng/mL         with peak PCT AND          PCT >= 0.5 ng/mL             Escalation of antibiotics  strongly encouraged.      Escalation of antibiotics        strongly encouraged.   Glucose, capillary     Status: Abnormal   Collection Time: 05/07/16  4:39 AM  Result Value Ref Range   Glucose-Capillary 204 (H) 65 - 99 mg/dL    Imaging / Studies: Dg Chest Port 1 View  Result Date: 05/06/2016 CLINICAL DATA:  Fever and asthma EXAM:  PORTABLE CHEST 1 VIEW COMPARISON:  Chest radiograph 03/10/2016 FINDINGS: Endotracheal tube tip is at the level of the clavicular heads 3 cm above the inferior margin of the carina. Cardiac silhouette is unchanged size, mildly enlarged. There is no pulmonary edema or focal airspace consolidation. There is shallow lung inflation. No pneumothorax or sizable pleural effusion. IMPRESSION: 1. Radiographically appropriate positioning of the endotracheal tube. 2. Mild cardiomegaly without overt pulmonary edema. Electronically Signed   By: Ulyses Jarred M.D.   On: 05/06/2016 20:41    Medications / Allergies: per chart  Antibiotics: Anti-infectives    Start     Dose/Rate Route Frequency Ordered Stop   05/07/16 0600  vancomycin (VANCOCIN) IVPB 1000 mg/200 mL premix     1,000 mg 200 mL/hr over 60 Minutes Intravenous Every 8 hours 05/06/16 2049     05/06/16 2030  piperacillin-tazobactam (ZOSYN) IVPB 3.375 g     3.375 g 12.5 mL/hr over 240 Minutes Intravenous Every 8 hours 05/06/16 2017     05/06/16 2030  vancomycin (VANCOCIN) 2,000 mg in sodium chloride 0.9 % 500 mL IVPB     2,000 mg 250 mL/hr over 120 Minutes Intravenous  Once 05/06/16 2018 05/06/16 2258   05/06/16 0600  clindamycin (CLEOCIN) IVPB 900 mg     900 mg 100 mL/hr over 30 Minutes Intravenous On call to O.R. 05/05/16 1644 05/06/16 0756   05/06/16 0600  gentamicin (GARAMYCIN) 540 mg in dextrose 5 % 100 mL IVPB     5 mg/kg  107 kg 227 mL/hr over 30 Minutes Intravenous On call to O.R. 05/05/16 1644 05/06/16 0755        Note: Portions of this report may have been transcribed using voice recognition software. Every effort was made to ensure accuracy; however, inadvertent computerized transcription errors may be present.   Any transcriptional errors that result from this process are unintentional.     [name], M.D., F.A.C.S. Gastrointestinal and Minimally Invasive Surgery [facility] [address] [place], [place] [address] [phone] Main / Paging   05/07/2016

## 2016-05-07 NOTE — Progress Notes (Signed)
BP down to 85/38, recycled pressure and moved BP cuff to ensure correct measurement.  Precedex gtt decreased to 0.414mcg/kg/hr, pt alert with no complaints.  Notified e-link.

## 2016-05-07 NOTE — Progress Notes (Signed)
eLink Physician-Brief Progress Note Patient Name: Shelley [name] DOB: 12/31/1965 MRN: [id number]   Date of Service  05/07/2016  HPI/Events of Note  Diuresed 1.5 L with lasix , now hypotensive, inspite of dropping precedex to 0.4  eICU Interventions  Ns 500 bolus     Intervention Category Intermediate Interventions: Hypotension - evaluation and management  ALVA,RAKESH V. 05/07/2016, 10:22 PM

## 2016-05-07 NOTE — Progress Notes (Signed)
PULMONARY / CRITICAL CARE MEDICINE   Name: Shelley [name] MRN: [id number] DOB: 10/23/1965    ADMISSION DATE:  05/03/2016 CONSULTATION DATE:  05/05/2016  REFERRING MD:  Karie SodaSteven Gross, M.D. / General Surgery  CHIEF COMPLAINT:  Acute Respiratory Failure  BRIEF HISTORY:  50 y.o. female patient of Dr. Adria DevonSouthard. Known history of OSA/OHS on home BiPAP. Patient is on oxygen chronically at 2 L/m as well as 4 L/m at night while sleeping. Also has a known history of asthma. Patient has a history of nonadherence to noninvasive positive pressure ventilation at home. Patient presented with symptomatic anemia due to bleeding from hemorrhoids. Dr. Randalyn RheaGrosse performed hemorrhoidectomy on 11/23. Patient required reintubation and postanesthesia care unit and subsequently transferred to the intensive care unit for further treatment and stabilization postoperatively.  SUBJECTIVE: Febrile overnight. Started on Empiric Vancomycin & Zosyn by E-MD. patient was apneic and went hypoxic during spontaneous breathing trial this morning.  REVIEW OF SYSTEMS:  Unable to obtain given intubation & sedation.  VITAL SIGNS: BP 115/85   Pulse 71   Temp 97.4 F (36.3 C) (Oral)   Resp 16   Ht 5\' 7"  (1.702 m)   Wt 235 lb 14.3 oz (107 kg)   SpO2 97%   BMI 36.95 kg/m   HEMODYNAMICS:    VENTILATOR SETTINGS: Vent Mode: PRVC FiO2 (%):  [40 %] 40 % Set Rate:  [16 bmp] 16 bmp Vt Set:  [500 mL] 500 mL PEEP:  [5 cmH20] 5 cmH20 Plateau Pressure:  [15 cmH20-24 cmH20] 20 cmH20  INTAKE / OUTPUT: I/O last 3 completed shifts: In: 3311.3 [P.O.:240; I.V.:1715.3; IV Piggyback:1356] Out: 1730 [Urine:1580; Blood:150]  PHYSICAL EXAMINATION: General:  No acute distress. Eyes closed. No family at bedside. Integument:  Warm & dry. Patchy erythematous rash within the fold under pannus unchanged. HEENT:  Moist mucus membranes. Endotracheal tube in place. No scleral icterus. Cardiovascular:  Regular rate. No edema. Regular rhythm. Normal  S1 & S2. Pulmonary:  Clear with auscultation bilaterally. Symmetric chest wall rise on ventilator. Abdomen: Soft. Normal bowel sounds. Protuberant. Musculoskeletal:  No joint deformity or effusion appreciated. Neurological: Sedated. No spontaneous movements at this time.  LABS:  BMET  Recent Labs Lab 05/04/16 0335 05/06/16 1447 05/07/16 0401  NA 140 139 136  137  K 3.4* 3.5 3.5  3.5  CL 88* 88* 90*  91*  CO2 46* 40* 36*  38*  BUN 6 14 12  12   CREATININE 0.46 0.64 0.68  0.71  GLUCOSE 191* 187* 197*  201*    Electrolytes  Recent Labs Lab 05/03/16 1535 05/04/16 0335 05/06/16 1447 05/07/16 0401  CALCIUM 8.5* 8.3* 8.3* 8.0*  8.1*  MG 1.5*  --  1.4* 2.4  PHOS  --   --  2.1* 3.1    CBC  Recent Labs Lab 05/06/16 1447 05/06/16 2100 05/07/16 0401  WBC 4.1 5.1 6.0  HGB 8.4* 8.6* 8.0*  HCT 29.2* 29.7* 27.8*  PLT 257 263 253    Coag's No results for input(s): APTT, INR in the last 168 hours.  Sepsis Markers  Recent Labs Lab 05/06/16 2100 05/07/16 0401  LATICACIDVEN 1.8  --   PROCALCITON <0.10 <0.10    ABG  Recent Labs Lab 05/06/16 1055  PHART 7.498*  PCO2ART 58.5*  PO2ART 267*    Liver Enzymes  Recent Labs Lab 05/03/16 1535 05/06/16 1447 05/07/16 0401  AST 16  --   --   ALT 11*  --   --   ALKPHOS 63  --   --  BILITOT 0.4  --   --   ALBUMIN 3.3* 2.9* 2.7*    Cardiac Enzymes No results for input(s): TROPONINI, PROBNP in the last 168 hours.  Glucose  Recent Labs Lab 05/06/16 1148 05/06/16 1707 05/06/16 2002 05/06/16 2354 05/07/16 0439 05/07/16 0839  GLUCAP 179* 202* 200* 217* 204* 180*    Imaging Portable Chest Xray  Result Date: 05/07/2016 CLINICAL DATA:  Respiratory failure EXAM: PORTABLE CHEST 1 VIEW COMPARISON:  05/06/2016 FINDINGS: Cardiac shadow remains enlarged. Endotracheal tube is again seen and stable. The lungs are well aerated bilaterally. Minimal likely atelectatic changes are noted in the medial right  lung base. No other focal abnormality is seen. IMPRESSION: Right basilar atelectasis. Electronically Signed   By: Alcide CleverMark  Lukens M.D.   On: 05/07/2016 07:11   Dg Chest Port 1 View  Result Date: 05/06/2016 CLINICAL DATA:  Fever and asthma EXAM: PORTABLE CHEST 1 VIEW COMPARISON:  Chest radiograph 03/10/2016 FINDINGS: Endotracheal tube tip is at the level of the clavicular heads 3 cm above the inferior margin of the carina. Cardiac silhouette is unchanged size, mildly enlarged. There is no pulmonary edema or focal airspace consolidation. There is shallow lung inflation. No pneumothorax or sizable pleural effusion. IMPRESSION: 1. Radiographically appropriate positioning of the endotracheal tube. 2. Mild cardiomegaly without overt pulmonary edema. Electronically Signed   By: Deatra RobinsonKevin  Herman M.D.   On: 05/06/2016 20:41     STUDIES:  Port CXR 11/24:  Questionable right basilar atelectasis versus opacity. ETT in good position.   MICROBIOLOGY: MRSA PCR 11/24:  Negative  Tracheal Asp Ctx 11/24 >> Blood Ctx x2 11/24 >> Urine Ctx 11/24 >>  ANTIBIOTICS: Clindamycin 11/24 (periop) Gentamycin 11/24 (periop) Nystatin Powder 11/24 >> Vancomycin 11/24 >> Zosyn 11/24 >>  SIGNIFICANT EVENTS: 11/21 - Admit w/ anemia from bleeding hemorrhoids 11/23 - PCCM consulted for pre-op risk assessment 11/24 - hemorrhoidectomy w/ reintubation in PACU and transfer to ICU 11/24 - Fever>>started on Vanc & Zosyn and cultured  LINES/TUBES: OETT 7.0 11/24 >> Foley 11/24 >> PIV x2  ASSESSMENT / PLAN:  PULMONARY A: Acute on Chronic Hypoxic Respiratory Failure - Post-op with reintubation in PACU. Possible Right Lower Lung Aspiration Pneumonia H/O Asthma H/O OSA/OHS - Nonadherent to BiPAP at home. VS patient. H/O Chronic Hypercarbic Respiratory Failure - ABG on BiPAP 17/11 w/ 4 L/m 7.32/87/141. Chronic Hypoxia - 2 L/m during the day & 4 L/m at night with BiPAP.  P:   Full Vent Support Decreasing set rate to 12  breaths per minute & tidal volume to 6 mL/kg ideal body weight Targeting Saturation 88-94% Intermittent ABG & Portable CXR Switching to Duoneb q6hr scheduled Continuing Budesonide 0.25mg  nebulized bid Continuing Singulair qhs S/P Lasix x1 on 11/25 & repeat again today  GASTROINTESTINAL A:   GIB from Hemorrhoids - S/P Hemorrhoidectomy. H/O GERD w/ Hiatal Hernia H/O AVM H/O Diverticulosis  P:   NPO Pepcid IV q12hr Post-op Surgical Care per General Surgery Holding on Tube Feedings  HEMATOLOGIC A:   Anemia - Acute blood loss from hemorrhoids. S/P hemorrhoidectomy.   P:  Trending cell counts daily w/ CBC Holding home Niferex Capsule SCDs Transfuse for Hgb <7.0 or active bleeding  CARDIOVASCULAR A:  No acute issues. H/O Hyperlipidemia H/O HTN  P:  Continuous Telemetry Monitoring Vitals per unit protocol Goal MAP >65 Holding home Hyzaar Continuing home Lipitor qhs  RENAL A:   Hypokalemia  Metabolic Alkalosis - Compensatory.  P:   Monitoring UOP with Foley Trending renal function & electrolytes  daily Replacing electrolytes as indicated  INFECTIOUS A:   Rash - Likely candida in fold of pannus.  FUO - 11/24. Possible Right Lower Lobe Aspiration Pneumonia  P:   Perioperative Clindamycin & Gentamycin. Continuing Empiric Vancomycin & Zosyn Day #2 w/ low threshold to stop Awaiting culture completion Nystatin Powder TID to rash  ENDOCRINE A:   H/O DM - A1c 6.0 on 03/11/16.   P:   Holding home Metformin Accu-Checks q4hr SSI per Moderate Algorithm   NEUROLOGIC A:   Sedation on Ventilator Post-Operative Pain H/O Intellectual Handicap  P:   RASS goal: 0 to -1 Precedex gtt Fentanyl IV prn Pain/Discomfort Versed IV prn Sedation Continuing home Zoloft 100mg  VT daily   FAMILY  - Updates: Unable to reach family at any of the numbers listed in the electronic medical record. Message left on the voicemail of Laurina Bustle to contact our hospital on  11/24.  - Inter-disciplinary family meet or Palliative Care meeting due by:  12/1  TODAY'S SUMMARY:  50 y.o. female with known history of chronic hypoxic and hypercarbic respiratory failure admitted with symptomatic anemia requiring hemorrhoidectomy on 11/24. Patient reintubated in postanesthesia care unit. Decreasing ventilator set rate to 12 breaths per minute & decreasing tidal volume. Checking ABG now. Suspect we are inducing and lower PCO2 which is suppressing her respiratory drive. Continuing empiric antibiotics for now.  I have spent a total of 32 minutes of critical care time today caring for the patient and reviewing the patient's electronic medical record.  Donna Christen Jamison Neighbor, M.D. [facility] Pulmonary & Critical Care Pager:  [phone] After 3pm or if no response, call [phone] 05/07/2016, 11:53 AM

## 2016-05-08 DIAGNOSIS — E876 Hypokalemia: Secondary | ICD-10-CM

## 2016-05-08 DIAGNOSIS — E662 Morbid (severe) obesity with alveolar hypoventilation: Secondary | ICD-10-CM

## 2016-05-08 DIAGNOSIS — G4733 Obstructive sleep apnea (adult) (pediatric): Secondary | ICD-10-CM

## 2016-05-08 LAB — GLUCOSE, CAPILLARY
GLUCOSE-CAPILLARY: 106 mg/dL — AB (ref 65–99)
GLUCOSE-CAPILLARY: 131 mg/dL — AB (ref 65–99)
GLUCOSE-CAPILLARY: 135 mg/dL — AB (ref 65–99)
GLUCOSE-CAPILLARY: 159 mg/dL — AB (ref 65–99)
Glucose-Capillary: 148 mg/dL — ABNORMAL HIGH (ref 65–99)
Glucose-Capillary: 163 mg/dL — ABNORMAL HIGH (ref 65–99)
Glucose-Capillary: 107 mg/dL — ABNORMAL HIGH (ref 65–99)

## 2016-05-08 LAB — CBC WITH DIFFERENTIAL/PLATELET
Basophils Absolute: 0 10*3/uL (ref 0.0–0.1)
Basophils Relative: 0 %
EOS PCT: 0 %
Eosinophils Absolute: 0 10*3/uL (ref 0.0–0.7)
HEMATOCRIT: 27.1 % — AB (ref 36.0–46.0)
Hemoglobin: 7.9 g/dL — ABNORMAL LOW (ref 12.0–15.0)
LYMPHS ABS: 2.7 10*3/uL (ref 0.7–4.0)
Lymphocytes Relative: 44 %
MCH: 23.1 pg — ABNORMAL LOW (ref 26.0–34.0)
MCHC: 29.2 g/dL — AB (ref 30.0–36.0)
MCV: 79.2 fL (ref 78.0–100.0)
MONO ABS: 0.7 10*3/uL (ref 0.1–1.0)
MONOS PCT: 11 %
NEUTROS ABS: 2.7 10*3/uL (ref 1.7–7.7)
Neutrophils Relative %: 45 %
PLATELETS: 232 10*3/uL (ref 150–400)
RBC: 3.42 MIL/uL — AB (ref 3.87–5.11)
RDW: 22.6 % — AB (ref 11.5–15.5)
WBC: 6.1 10*3/uL (ref 4.0–10.5)

## 2016-05-08 LAB — RENAL FUNCTION PANEL
Albumin: 2.7 g/dL — ABNORMAL LOW (ref 3.5–5.0)
Anion gap: 8 (ref 5–15)
BUN: 14 mg/dL (ref 6–20)
CHLORIDE: 94 mmol/L — AB (ref 101–111)
CO2: 37 mmol/L — AB (ref 22–32)
CREATININE: 0.69 mg/dL (ref 0.44–1.00)
Calcium: 8.1 mg/dL — ABNORMAL LOW (ref 8.9–10.3)
GFR calc Af Amer: >60 mL/min (ref >60)
GFR calc non Af Amer: >60 mL/min (ref >60)
GLUCOSE: 145 mg/dL — AB (ref 65–99)
POTASSIUM: 3.3 mmol/L — AB (ref 3.5–5.1)
Phosphorus: 5.9 mg/dL — ABNORMAL HIGH (ref 2.5–4.6)
Sodium: 139 mmol/L (ref 135–145)

## 2016-05-08 LAB — MAGNESIUM: Magnesium: 1.9 mg/dL (ref 1.7–2.4)

## 2016-05-08 LAB — URINE CULTURE
CULTURE: NO GROWTH
SPECIAL REQUESTS: NORMAL

## 2016-05-08 LAB — PROCALCITONIN: Procalcitonin: <0.1 ng/mL

## 2016-05-08 MED ORDER — POTASSIUM CHLORIDE 2 MEQ/ML IV SOLN
30.0000 meq | Freq: Once | INTRAVENOUS | Status: AC
  Administered 2016-05-08: 30 meq via INTRAVENOUS
  Filled 2016-05-08: qty 15

## 2016-05-08 NOTE — Progress Notes (Signed)
eLink Physician-Brief Progress Note Patient Name: Shelley [name] A Ludolph DOB: 08/15/1965 MRN: [id number]   Date of Service  05/08/2016  HPI/Events of Note  Hypokalemia  eICU Interventions  Potassium replaced     Intervention Category Intermediate Interventions: Electrolyte abnormality - evaluation and management  Park Beck 05/08/2016, 4:40 AM

## 2016-05-08 NOTE — Progress Notes (Signed)
PULMONARY / CRITICAL CARE MEDICINE   Name: Shelley [name]: [id number] DOB: 12/01/1965    ADMISSION DATE:  05/03/2016 CONSULTATION DATE:  05/05/2016  REFERRING MD:  Karie SodaSteven Gross, M.D. / General Surgery  CHIEF COMPLAINT:  Acute Respiratory Failure  BRIEF HISTORY:  50 y.o. female patient of Dr. Adria DevonSouthard. Known history of OSA/OHS on home BiPAP. Patient is on oxygen chronically at 2 L/m as well as 4 L/m at night while sleeping. Also has a known history of asthma. Patient has a history of nonadherence to noninvasive positive pressure ventilation at home. Patient presented with symptomatic anemia due to bleeding from hemorrhoids. Dr. Randalyn RheaGrosse performed hemorrhoidectomy on 11/23. Patient required reintubation and postanesthesia care unit and subsequently transferred to the intensive care unit for further treatment and stabilization postoperatively.  SUBJECTIVE: Patient had transient hypotension overnight after Lasix. Required gentle IV fluid bolus.Patient denies any pain or difficulty breathing this morning.  REVIEW OF SYSTEMS:  Unable to obtain given intubation.  VITAL SIGNS: BP 108/88   Pulse 74   Temp 98.2 F (36.8 C) (Axillary)   Resp (!) 21   Ht 5\' 7"  (1.702 m)   Wt 235 lb 14.3 oz (107 kg)   SpO2 95%   BMI 36.95 kg/m   HEMODYNAMICS:    VENTILATOR SETTINGS: Vent Mode: PRVC FiO2 (%):  [30 %-40 %] 30 % Set Rate:  [12 bmp-16 bmp] 12 bmp Vt Set:  [370 mL-500 mL] 370 mL PEEP:  [5 cmH20] 5 cmH20 Plateau Pressure:  [13 cmH20-20 cmH20] 14 cmH20  INTAKE / OUTPUT: I/O last 3 completed shifts: In: 3385.5 [I.V.:1129.5; IV Piggyback:2256] Out: 3425 [Urine:3425]  PHYSICAL EXAMINATION: General:  Awake. No distress. No family at bedside. Integument:  Warm & dry. Patchy erythematous rash within the fold under pannus unchanged. HEENT:  Moist mucus membranes. Endotracheal tube in place. No scleral icterus. Cardiovascular:  Regular rate. No edema. Regular rhythm.  Pulmonary:  Clear with  auscultation bilaterally. Symmetric chest wall rise on ventilator. Normal work of breathing on pressure support 0/5. Abdomen: Soft. Normal bowel sounds. Protuberant. Nontender. Musculoskeletal:  No joint deformity or effusion appreciated. Neurological: Following commands. Moving all 4 extremities equally. Cranial nerves grossly intact.  LABS:  BMET  Recent Labs Lab 05/06/16 1447 05/07/16 0401 05/08/16 0327  NA 139 136  137 139  K 3.5 3.5  3.5 3.3*  CL 88* 90*  91* 94*  CO2 40* 36*  38* 37*  BUN 14 12  12 14   CREATININE 0.64 0.68  0.71 0.69  GLUCOSE 187* 197*  201* 145*    Electrolytes  Recent Labs Lab 05/06/16 1447 05/07/16 0401 05/08/16 0327  CALCIUM 8.3* 8.0*  8.1* 8.1*  MG 1.4* 2.4 1.9  PHOS 2.1* 3.1 5.9*    CBC  Recent Labs Lab 05/06/16 2100 05/07/16 0401 05/08/16 0327  WBC 5.1 6.0 6.1  HGB 8.6* 8.0* 7.9*  HCT 29.7* 27.8* 27.1*  PLT 263 253 232    Coag's No results for input(s): APTT, INR in the last 168 hours.  Sepsis Markers  Recent Labs Lab 05/06/16 2100 05/07/16 0401 05/08/16 0327  LATICACIDVEN 1.8  --   --   PROCALCITON <0.10 <0.10 <0.10    ABG  Recent Labs Lab 05/06/16 1055 05/07/16 1204  PHART 7.498* 7.530*  PCO2ART 58.5* 45.4  PO2ART 267* 73.1*    Liver Enzymes  Recent Labs Lab 05/03/16 1535 05/06/16 1447 05/07/16 0401 05/08/16 0327  AST 16  --   --   --   ALT  11*  --   --   --   ALKPHOS 63  --   --   --   BILITOT 0.4  --   --   --   ALBUMIN 3.3* 2.9* 2.7* 2.7*    Cardiac Enzymes No results for input(s): TROPONINI, PROBNP in the last 168 hours.  Glucose  Recent Labs Lab 05/07/16 1215 05/07/16 1602 05/07/16 2025 05/07/16 2349 05/08/16 0420 05/08/16 0809  GLUCAP 159* 170* 111* 148* 159* 163*    Imaging No results found.   STUDIES:  Port CXR 11/24:  Questionable right basilar atelectasis versus opacity. ETT in good position.   MICROBIOLOGY: MRSA PCR 11/24:  Negative  Tracheal Asp Ctx  11/24 >> Blood Ctx x2 11/24 >> Urine Ctx 11/24 >>  ANTIBIOTICS: Clindamycin 11/24 (periop) Gentamycin 11/24 (periop) Vancomycin 11/24 - 11/26 Nystatin Powder 11/24 >> Zosyn 11/24 >>  SIGNIFICANT EVENTS: 11/21 - Admit w/ anemia from bleeding hemorrhoids 11/23 - PCCM consulted for pre-op risk assessment 11/24 - hemorrhoidectomy w/ reintubation in PACU and transfer to ICU 11/24 - Fever>>started on Vanc & Zosyn and cultured  LINES/TUBES: OETT 7.0 11/24 >> Foley 11/24 >> PIV x2  ASSESSMENT / PLAN:  PULMONARY A: Acute on Chronic Hypoxic Respiratory Failure - Post-op with reintubation in PACU. Acute Respiratory Alkalosis - Likely due to over-ventilation. Possible Right Lower Lung Aspiration Pneumonia H/O Asthma H/O OSA/OHS - Nonadherent to BiPAP at home. VS patient. H/O Chronic Hypercarbic Respiratory Failure - ABG on BiPAP 17/11 w/ 4 L/m 7.32/87/141. Chronic Hypoxia - 2 L/m during the day & 4 L/m at night with BiPAP.  P:   Full Vent Support SBT this morning Intermittent ABG & Portable CXR Duoneb q6hr scheduled Continuing Budesonide 0.25mg  nebulized bid Continuing Singulair qhs Probable extubation today  GASTROINTESTINAL A:   GIB from Hemorrhoids - S/P Hemorrhoidectomy. H/O GERD w/ Hiatal Hernia H/O AVM H/O Diverticulosis  P:   NPO Pepcid IV q12hr Post-op Surgical Care per General Surgery Holding on Tube Feedings  HEMATOLOGIC A:   Anemia - Acute blood loss from hemorrhoids. S/P hemorrhoidectomy.   P:  Trending cell counts daily w/ CBC Holding home Niferex Capsule SCDs Transfuse for Hgb <7.0 or active bleeding  CARDIOVASCULAR A:  H/O Hyperlipidemia H/O HTN  P:  Continuous Telemetry Monitoring Vitals per unit protocol Goal MAP >65 Holding home Hyzaar Continuing home Lipitor qhs  RENAL A:   Hypokalemia - Replaced. Metabolic Alkalosis - Compensatory.  P:   Monitoring UOP with Foley Trending renal function & electrolytes daily Replacing  electrolytes as indicated KCl IV  INFECTIOUS A:   Rash - Likely candida in fold of pannus.  FUO - 11/24. Possible Right Lower Lobe Aspiration Pneumonia  P:   S/P Perioperative Clindamycin & Gentamycin. Empiric Vancomycin & Zosyn Day #3  Discontinuing Vancomycin Awaiting culture completion Nystatin Powder TID to rash  ENDOCRINE A:   H/O DM - A1c 6.0 on 03/11/16.   P:   Holding home Metformin Accu-Checks q4hr SSI per Moderate Algorithm   NEUROLOGIC A:   Sedation on Ventilator Post-Operative Pain H/O Intellectual Handicap  P:   RASS goal: 0 to -1 Precedex gtt Fentanyl IV prn Pain/Discomfort Versed IV prn Sedation Continuing home Zoloft 100mg  VT daily   FAMILY  - Updates: Unable to reach family at any of the numbers listed in the electronic medical record. Message left on the voicemail of Laurina Bustle to contact our hospital on 11/24.  - Inter-disciplinary family meet or Palliative Care meeting due by:  12/1  TODAY'S SUMMARY:  50 y.o. female with known history of chronic hypoxic and hypercarbic respiratory failure admitted with symptomatic anemia requiring hemorrhoidectomy on 11/24. Patient reintubated in postanesthesia care unit. Finishing spontaneous breathing trial this morning with probable extubation. Plan for nocturnal BiPAP given underlying OSA/OHS. Continuing Zosyn for possible aspiration pneumonia. Discontinuing vancomycin today.  I have spent a total of 31 minutes of critical care time today caring for the patient and reviewing the patient's electronic medical record.  Donna ChristenJennings E. Jamison NeighborNestor, M.D. [facility] Pulmonary & Critical Care Pager:  [phone] After 3pm or if no response, call [phone] 05/08/2016, 8:25 AM

## 2016-05-08 NOTE — Progress Notes (Signed)
PCCM Attending SBT Note: Patient seen at the end of her spontaneous breathing trial on pressure support 0/5. Normal work of breathing. Respiratory rate in the 20s. Saturation stable. Checking for cuff leak and if present plan for extubation. I have ordered BiPAP as needed as well as at night all sleeping via home settings. Continuing oxygen to target saturation 88-94%.  Donna ChristenJennings E. Jamison NeighborNestor, M.D. [facility] Pulmonary & Critical Care Pager:  [phone] After 3pm or if no response, call [phone] 8:44 AM 05/08/16

## 2016-05-08 NOTE — Progress Notes (Signed)
2 Days Post-Op  Subjective: Extubated this am. Min rectal discomfort. No n/v.   Objective: Vital signs in last 24 hours: Temp:  [98.2 F (36.8 C)-100.8 F (38.2 C)] 99 F (37.2 C) (11/26 0800) Pulse Rate:  [62-83] 74 (11/26 0700) Resp:  [12-22] 21 (11/26 0700) BP: (63-135)/(11-88) 108/88 (11/26 0700) SpO2:  [94 %-100 %] 97 % (11/26 0902) FiO2 (%):  [30 %-40 %] 30 % (11/26 0800) Last BM Date: 05/05/16  Intake/Output from previous day: 11/25 0701 - 11/26 0700 In: 1698.8 [I.V.:648.8; IV Piggyback:1050] Out: 2645 [Urine:2645] Intake/Output this shift: Total I/O In: 60.2 [I.V.:60.2] Out: 100 [Urine:100]  Obese, resting comfortably Soft, nt, nd  Lab Results:   Recent Labs  05/07/16 0401 05/08/16 0327  WBC 6.0 6.1  HGB 8.0* 7.9*  HCT 27.8* 27.1*  PLT 253 232   BMET  Recent Labs  05/07/16 0401 05/08/16 0327  NA 136  137 139  K 3.5  3.5 3.3*  CL 90*  91* 94*  CO2 36*  38* 37*  GLUCOSE 197*  201* 145*  BUN 12  12 14   CREATININE 0.68  0.71 0.69  CALCIUM 8.0*  8.1* 8.1*   PT/INR No results for input(s): LABPROT, INR in the last 72 hours. ABG  Recent Labs  05/06/16 1055 05/07/16 1204  PHART 7.498* 7.530*  HCO3 45.0* 37.8*    Studies/Results: Portable Chest Xray  Result Date: 05/07/2016 CLINICAL DATA:  Respiratory failure EXAM: PORTABLE CHEST 1 VIEW COMPARISON:  05/06/2016 FINDINGS: Cardiac shadow remains enlarged. Endotracheal tube is again seen and stable. The lungs are well aerated bilaterally. Minimal likely atelectatic changes are noted in the medial right lung base. No other focal abnormality is seen. IMPRESSION: Right basilar atelectasis. Electronically Signed   By: Alcide CleverMark  Lukens M.D.   On: 05/07/2016 07:11   Dg Chest Port 1 View  Result Date: 05/06/2016 CLINICAL DATA:  Fever and asthma EXAM: PORTABLE CHEST 1 VIEW COMPARISON:  Chest radiograph 03/10/2016 FINDINGS: Endotracheal tube tip is at the level of the clavicular heads 3 cm above the  inferior margin of the carina. Cardiac silhouette is unchanged size, mildly enlarged. There is no pulmonary edema or focal airspace consolidation. There is shallow lung inflation. No pneumothorax or sizable pleural effusion. IMPRESSION: 1. Radiographically appropriate positioning of the endotracheal tube. 2. Mild cardiomegaly without overt pulmonary edema. Electronically Signed   By: Deatra RobinsonKevin  Herman M.D.   On: 05/06/2016 20:41    Anti-infectives: Anti-infectives    Start     Dose/Rate Route Frequency Ordered Stop   05/07/16 0600  vancomycin (VANCOCIN) IVPB 1000 mg/200 mL premix  Status:  Discontinued     1,000 mg 200 mL/hr over 60 Minutes Intravenous Every 8 hours 05/06/16 2049 05/08/16 0828   05/06/16 2030  piperacillin-tazobactam (ZOSYN) IVPB 3.375 g     3.375 g 12.5 mL/hr over 240 Minutes Intravenous Every 8 hours 05/06/16 2017     05/06/16 2030  vancomycin (VANCOCIN) 2,000 mg in sodium chloride 0.9 % 500 mL IVPB     2,000 mg 250 mL/hr over 120 Minutes Intravenous  Once 05/06/16 2018 05/06/16 2258   05/06/16 0600  clindamycin (CLEOCIN) IVPB 900 mg     900 mg 100 mL/hr over 30 Minutes Intravenous On call to O.R. 05/05/16 1644 05/06/16 0756   05/06/16 0600  gentamicin (GARAMYCIN) 540 mg in dextrose 5 % 100 mL IVPB     5 mg/kg  107 kg 227 mL/hr over 30 Minutes Intravenous On call to O.R. 05/05/16 1644  05/06/16 0755      Assessment/Plan: s/p Procedure(s): EXAM UNDER ANESTHESIA (N/A) HEMORRHOIDECTOMY, HEMORRHOID LIGATION (N/A) POST-OPERATIVE DIAGNOSIS: Bleeding internal hemorrhoids with symptomatic anemia  PROCEDURE:   EXAM UNDER ANESTHESIA HEMORRHOIDECTOMY, HEMORRHOID LIGATION  SURGEON: Surgeon(s): Karie SodaSteven Gross, MD  OR FINDINGS:  Inflamed right posterior and right anterior hemorrhoids. Less so on the left side. All three problems rather friable and bled easily. Some external components.Probable small posterior midline fissure as well but no increased sphincter  tone or stricturing.  Moderate cystocele and significant rectocele. Somewhat redundant floppy rectum but no true procidentia prolapse   Clears and adv diet as tolerated.  OOB to chair, ambulate,  PT consult Instructed pt on IS Will ask Dr [name] about abx and chemical vte prophylaxis Expect some intermittent rectal bleeding Dc foley  Mary SellaEric M. Andrey CampanileWilson, MD, FACS General, Bariatric, & Minimally Invasive Surgery [facility] [place]    LOS: 5 days    Atilano InaWILSON,Jayion Schneck M 05/08/2016

## 2016-05-08 NOTE — Procedures (Signed)
Extubation Procedure Note  Patient Details:   Name: Shelley [name] DOB: 07/11/1965 MRN: [id number]   Airway Documentation:     Evaluation  O2 sats: stable throughout Complications: No apparent complications Patient did tolerate procedure well. Bilateral Breath Sounds: Diminished   Yes  Dairl PonderWalters, Jevonte Clanton Nannette 05/08/2016, 8:57 AM   Positive cuff leak Placed on 3 lpm (goal 88-94%)- Uses 2-4 lpm at home

## 2016-05-08 NOTE — Progress Notes (Signed)
Pt. Has had 5-6 episodes of loose bloody  stools. VS stable. Provider notified.

## 2016-05-09 ENCOUNTER — Encounter (HOSPITAL_COMMUNITY): Payer: Self-pay | Admitting: Surgery

## 2016-05-09 DIAGNOSIS — J9620 Acute and chronic respiratory failure, unspecified whether with hypoxia or hypercapnia: Secondary | ICD-10-CM

## 2016-05-09 DIAGNOSIS — N179 Acute kidney failure, unspecified: Secondary | ICD-10-CM

## 2016-05-09 DIAGNOSIS — D649 Anemia, unspecified: Secondary | ICD-10-CM

## 2016-05-09 LAB — CBC WITH DIFFERENTIAL/PLATELET
BASOS ABS: 0 10*3/uL (ref 0.0–0.1)
BASOS PCT: 0 %
EOS ABS: 0 10*3/uL (ref 0.0–0.7)
Eosinophils Relative: 1 %
HEMATOCRIT: 27.9 % — AB (ref 36.0–46.0)
HEMOGLOBIN: 8 g/dL — AB (ref 12.0–15.0)
LYMPHS ABS: 1.3 10*3/uL (ref 0.7–4.0)
Lymphocytes Relative: 35 %
MCH: 23.2 pg — AB (ref 26.0–34.0)
MCHC: 28.7 g/dL — AB (ref 30.0–36.0)
MCV: 80.9 fL (ref 78.0–100.0)
Monocytes Absolute: 0.3 10*3/uL (ref 0.1–1.0)
Monocytes Relative: 9 %
NEUTROS ABS: 2.2 10*3/uL (ref 1.7–7.7)
Neutrophils Relative %: 55 %
Platelets: 251 10*3/uL (ref 150–400)
RBC: 3.45 MIL/uL — ABNORMAL LOW (ref 3.87–5.11)
RDW: 22.6 % — AB (ref 11.5–15.5)
WBC: 3.8 10*3/uL — ABNORMAL LOW (ref 4.0–10.5)

## 2016-05-09 LAB — RENAL FUNCTION PANEL
ALBUMIN: 2.5 g/dL — AB (ref 3.5–5.0)
ANION GAP: 8 (ref 5–15)
BUN: 16 mg/dL (ref 6–20)
CO2: 35 mmol/L — AB (ref 22–32)
Calcium: 8 mg/dL — ABNORMAL LOW (ref 8.9–10.3)
Chloride: 98 mmol/L — ABNORMAL LOW (ref 101–111)
Creatinine, Ser: 1.73 mg/dL — ABNORMAL HIGH (ref 0.44–1.00)
GFR calc Af Amer: 39 mL/min — ABNORMAL LOW (ref >60)
GFR calc non Af Amer: 33 mL/min — ABNORMAL LOW (ref >60)
GLUCOSE: 132 mg/dL — AB (ref 65–99)
PHOSPHORUS: 5.3 mg/dL — AB (ref 2.5–4.6)
POTASSIUM: 3.2 mmol/L — AB (ref 3.5–5.1)
SODIUM: 141 mmol/L (ref 135–145)

## 2016-05-09 LAB — BASIC METABOLIC PANEL
Anion gap: 8 (ref 5–15)
BUN: 15 mg/dL (ref 6–20)
CALCIUM: 7.8 mg/dL — AB (ref 8.9–10.3)
CO2: 32 mmol/L (ref 22–32)
Chloride: 99 mmol/L — ABNORMAL LOW (ref 101–111)
Creatinine, Ser: 1.84 mg/dL — ABNORMAL HIGH (ref 0.44–1.00)
GFR calc Af Amer: 36 mL/min — ABNORMAL LOW (ref >60)
GFR, EST NON AFRICAN AMERICAN: 31 mL/min — AB (ref >60)
Glucose, Bld: 160 mg/dL — ABNORMAL HIGH (ref 65–99)
POTASSIUM: 3.3 mmol/L — AB (ref 3.5–5.1)
SODIUM: 139 mmol/L (ref 135–145)

## 2016-05-09 LAB — CULTURE, RESPIRATORY

## 2016-05-09 LAB — CULTURE, RESPIRATORY W GRAM STAIN
Culture: NORMAL
Special Requests: NORMAL

## 2016-05-09 LAB — MAGNESIUM: Magnesium: 2.1 mg/dL (ref 1.7–2.4)

## 2016-05-09 LAB — GLUCOSE, CAPILLARY
GLUCOSE-CAPILLARY: 122 mg/dL — AB (ref 65–99)
GLUCOSE-CAPILLARY: 152 mg/dL — AB (ref 65–99)
Glucose-Capillary: 135 mg/dL — ABNORMAL HIGH (ref 65–99)
Glucose-Capillary: 137 mg/dL — ABNORMAL HIGH (ref 65–99)
Glucose-Capillary: 183 mg/dL — ABNORMAL HIGH (ref 65–99)

## 2016-05-09 MED ORDER — INSULIN ASPART 100 UNIT/ML ~~LOC~~ SOLN: 0.0000 [IU] | Freq: Every day | SUBCUTANEOUS | Status: DC

## 2016-05-09 MED ORDER — ACETAMINOPHEN 325 MG PO TABS: 650.0000 mg | ORAL_TABLET | ORAL | Status: DC | PRN

## 2016-05-09 MED ORDER — POTASSIUM CHLORIDE IN NACL 20-0.9 MEQ/L-% IV SOLN
INTRAVENOUS | Status: DC
  Filled 2016-05-09: qty 1000

## 2016-05-09 MED ORDER — SERTRALINE HCL 50 MG PO TABS
100.0000 mg | ORAL_TABLET | Freq: Every day | ORAL | Status: DC
  Administered 2016-05-10 – 2016-05-14 (×5): 100 mg via ORAL
  Filled 2016-05-09 (×5): qty 2

## 2016-05-09 MED ORDER — HEPARIN SODIUM (PORCINE) 5000 UNIT/ML IJ SOLN
5000.0000 [IU] | Freq: Three times a day (TID) | INTRAMUSCULAR | Status: DC
  Administered 2016-05-09 – 2016-05-10 (×3): 5000 [IU] via SUBCUTANEOUS
  Filled 2016-05-09 (×3): qty 1

## 2016-05-09 MED ORDER — SODIUM CHLORIDE 0.9 % IV BOLUS (SEPSIS)
1000.0000 mL | Freq: Once | INTRAVENOUS | Status: AC
  Administered 2016-05-09: 1000 mL via INTRAVENOUS

## 2016-05-09 MED ORDER — MONTELUKAST SODIUM 10 MG PO TABS
10.0000 mg | ORAL_TABLET | Freq: Every day | ORAL | Status: DC
  Administered 2016-05-09 – 2016-05-13 (×5): 10 mg via ORAL
  Filled 2016-05-09 (×5): qty 1

## 2016-05-09 MED ORDER — IPRATROPIUM-ALBUTEROL 0.5-2.5 (3) MG/3ML IN SOLN
3.0000 mL | Freq: Four times a day (QID) | RESPIRATORY_TRACT | Status: DC
  Administered 2016-05-10 – 2016-05-12 (×8): 3 mL via RESPIRATORY_TRACT
  Filled 2016-05-09 (×9): qty 3

## 2016-05-09 MED ORDER — FAMOTIDINE 20 MG PO TABS
20.0000 mg | ORAL_TABLET | Freq: Two times a day (BID) | ORAL | Status: DC
  Administered 2016-05-09 – 2016-05-10 (×2): 20 mg via ORAL
  Filled 2016-05-09 (×2): qty 1

## 2016-05-09 MED ORDER — INSULIN ASPART 100 UNIT/ML ~~LOC~~ SOLN
0.0000 [IU] | Freq: Three times a day (TID) | SUBCUTANEOUS | Status: DC
Start: 2016-05-10 — End: 2016-05-14
  Administered 2016-05-10: 7 [IU] via SUBCUTANEOUS
  Administered 2016-05-10 – 2016-05-11 (×3): 3 [IU] via SUBCUTANEOUS
  Administered 2016-05-12: 4 [IU] via SUBCUTANEOUS
  Administered 2016-05-12 – 2016-05-13 (×2): 3 [IU] via SUBCUTANEOUS
  Administered 2016-05-13: 4 [IU] via SUBCUTANEOUS
  Administered 2016-05-14: 3 [IU] via SUBCUTANEOUS

## 2016-05-09 MED ORDER — SODIUM CHLORIDE 0.9 % IV SOLN
INTRAVENOUS | Status: DC
  Administered 2016-05-13 (×2): via INTRAVENOUS

## 2016-05-09 MED ORDER — OXYCODONE HCL 5 MG PO TABS
5.0000 mg | ORAL_TABLET | ORAL | Status: DC | PRN
  Administered 2016-05-09: 5 mg via ORAL
  Administered 2016-05-09 – 2016-05-10 (×3): 10 mg via ORAL
  Administered 2016-05-11: 5 mg via ORAL
  Filled 2016-05-09 (×2): qty 1
  Filled 2016-05-09 (×3): qty 2

## 2016-05-09 MED ORDER — POTASSIUM CHLORIDE CRYS ER 20 MEQ PO TBCR
40.0000 meq | EXTENDED_RELEASE_TABLET | Freq: Once | ORAL | Status: AC
  Administered 2016-05-09: 40 meq via ORAL
  Filled 2016-05-09: qty 2

## 2016-05-09 MED ORDER — ATORVASTATIN CALCIUM 10 MG PO TABS
10.0000 mg | ORAL_TABLET | Freq: Every day | ORAL | Status: DC
  Administered 2016-05-09 – 2016-05-13 (×5): 10 mg via ORAL
  Filled 2016-05-09 (×4): qty 1

## 2016-05-09 NOTE — Progress Notes (Signed)
3 Days Post-Op  Subjective: Having allot of loose liquid stools, looks like iodine per the nurse, very sore and tender.    Objective: Vital signs in last 24 hours: Temp:  [97.7 F (36.5 C)-99.3 F (37.4 C)] 98.8 F (37.1 C) (11/27 0800) Pulse Rate:  [69-104] 80 (11/27 0800) Resp:  [15-26] 23 (11/27 0800) BP: (112-164)/(43-98) 157/85 (11/27 0800) SpO2:  [90 %-100 %] 100 % (11/27 0800) Last BM Date: 05/08/16 600 PO Urine"  545 BM x 12 Afebrile, VSS - off Ventilator, CPAP last PM - now down to [place] Creatinine is up, K+ is down, mag is 2.1 Anemia is stable Intake/Output from previous day: 11/26 0701 - 11/27 0700 In: 970.2 [P.O.:600; I.V.:170.2; IV Piggyback:200] Out: 545 [Urine:545] Intake/Output this shift: No intake/output data recorded.  General appearance: alert, cooperative and no distress GI: soft, non-tender; bowel sounds normal; no masses,  no organomegaly and area under the panus with powder in place and no erythema Rectal exam, no bleeding, I did not do a digital, exam.       Lab Results:   Recent Labs  05/08/16 0327 05/09/16 0318  WBC 6.1 3.8*  HGB 7.9* 8.0*  HCT 27.1* 27.9*  PLT 232 251    BMET  Recent Labs  05/08/16 0327 05/09/16 0318  NA 139 141  K 3.3* 3.2*  CL 94* 98*  CO2 37* 35*  GLUCOSE 145* 132*  BUN 14 16  CREATININE 0.69 1.73*  CALCIUM 8.1* 8.0*   PT/INR No results for input(s): LABPROT, INR in the last 72 hours.   Recent Labs Lab 05/03/16 1535 05/06/16 1447 05/07/16 0401 05/08/16 0327 05/09/16 0318  AST 16  --   --   --   --   ALT 11*  --   --   --   --   ALKPHOS 63  --   --   --   --   BILITOT 0.4  --   --   --   --   PROT 7.7  --   --   --   --   ALBUMIN 3.3* 2.9* 2.7* 2.7* 2.5*     Lipase  No results found for: LIPASE   Studies/Results: No results found. Prior to Admission medications   Medication Sig Start Date End Date Taking? Authorizing Provider  albuterol (PROVENTIL) (2.5 MG/3ML) 0.083% nebulizer solution  Take 3 mLs (2.5 mg total) by nebulization every 6 (six) hours as needed for wheezing or shortness of breath. 06/04/15  Yes Chesley Mires, MD  atorvastatin (LIPITOR) 10 MG tablet Take 10 mg by mouth daily.   Yes Historical Provider, MD  budesonide (PULMICORT) 0.25 MG/2ML nebulizer solution Take 0.25 mg by nebulization 2 (two) times daily.    Yes Historical Provider, MD  famotidine (PEPCID) 20 MG tablet Take 1 tablet (20 mg total) by mouth at bedtime. 09/21/15  Yes Florencia Reasons, MD  FERREX 150 150 MG capsule Take 150 mg by mouth daily. 07/22/15  Yes Historical Provider, MD  losartan-hydrochlorothiazide (HYZAAR) 100-12.5 MG tablet Take 1 tablet by mouth daily.  03/17/16  Yes Historical Provider, MD  metFORMIN (GLUCOPHAGE) 500 MG tablet Take 1,000 mg by mouth every evening.    Yes Historical Provider, MD  montelukast (SINGULAIR) 10 MG tablet Take 10 mg by mouth at bedtime.   Yes Historical Provider, MD  omeprazole (PRILOSEC) 40 MG capsule Take 40 mg by mouth daily.  04/04/16  Yes Historical Provider, MD  sertraline (ZOLOFT) 100 MG tablet Take 100 mg by  mouth daily.  03/17/16  Yes Historical Provider, MD  loratadine (CLARITIN) 10 MG tablet Take 10 mg by mouth daily as needed for allergies.     Historical Provider, MD  losartan-hydrochlorothiazide (HYZAAR) 50-12.5 MG tablet Take 1 tablet by mouth daily. Patient not taking: Reported on 05/03/2016 09/21/15   Florencia Reasons, MD  omeprazole (PRILOSEC) 20 MG capsule Take 40 mg by mouth daily.    Historical Provider, MD  sertraline (ZOLOFT) 50 MG tablet Take 100 mg by mouth daily.     Historical Provider, MD    Medications: . sodium chloride   Intravenous Once  . atorvastatin  10 mg Per Tube q1800  . budesonide  0.25 mg Nebulization BID  . chlorhexidine gluconate (MEDLINE KIT)  15 mL Mouth Rinse BID  . famotidine (PEPCID) IV  20 mg Intravenous Q12H  . hydrocortisone-pramoxine  1 application Rectal QID  . insulin aspart  0-15 Units Subcutaneous Q4H  . ipratropium-albuterol   3 mL Nebulization Q6H  . lip balm  1 application Topical BID  . montelukast  10 mg Per Tube QHS  . nystatin   Topical TID  . piperacillin-tazobactam (ZOSYN)  IV  3.375 g Intravenous Q8H  . sertraline  100 mg Per Tube Daily  . sodium chloride flush  3 mL Intravenous Q12H  . vitamin C  500 mg Oral BID   . sodium chloride 10 mL/hr at 05/08/16 0700  . dexmedetomidine Stopped (05/08/16 1000)    Assessment/Plan Bleeding internal hemorrhoids with symptomatic anemia S/p EXAM UNDER ANESTHESIA, HEMORRHOIDECTOMY,HEMORRHOID LIGATION, 05/06/16, Dr. Remo Lipps GRoss  POD 3 Acute on Chronic Hypoxic Respiratory Failure - Post-op with reintubation in PACU. Acute Respiratory Alkalosis - Likely due to over-ventilation. Possible Right Lower Lung Aspiration Pneumonia H/O Asthma Acute kidney injury - creatinine is up to 1.73 this AM GERD/Hiatal hernia/Hx of AVM Anemia Rash - Likely candida in fold of pannus (Body mass index is 36.95) AODM FEN:No IV fluids/ clears liwuids ID: Vancomycin d/ced 05/07/16 after 2 days/Zosyn 05/06/16 =>> day 4 DVT:  SCD - anticoagulation held for bleeding issues  Plan:  Sitz bath, restart some IV fluids, and watch creatinine. Will give her full liquids, but I think she could go to a soft diet, will check with Dr. Excell Seltzer.  PO pain meds, no NSAIDS with bleeding issue.  We can restart her on Heparin for DVT prophylaxis, and watch for bleeding.       LOS: 6 days    Shelley [name] 05/09/2016 [phone]

## 2016-05-09 NOTE — Progress Notes (Addendum)
PULMONARY / CRITICAL CARE MEDICINE   Name: Shelley [name] MRN: [id number] DOB: [birth year]    ADMISSION DATE:  05/03/2016 CONSULTATION DATE:  05/05/2016  REFERRING MD:  Shelley [name], M.D. / General Surgery  CHIEF COMPLAINT:  Acute Respiratory Failure  BRIEF HISTORY:  50 y.o. female patient of Dr. Adria [name]. Known history of OSA/OHS on home BiPAP. Patient is on oxygen chronically at 2 L/m as well as 4 L/m at night while sleeping. Also has a known history of asthma. Patient has a history of nonadherence to noninvasive positive pressure ventilation at home. Patient presented with symptomatic anemia due to bleeding from hemorrhoids. Dr. Randalyn [name] performed hemorrhoidectomy on 11/23. Patient required reintubation and postanesthesia care unit and subsequently transferred to the intensive care unit for further treatment and stabilization postoperatively.   LINES/TUBES: OETT 7.0 11/24 >> Foley 11/24 >> PIV x2   SIGNIFICANT EVENTS: 11/21 - Admit w/ anemia from bleeding hemorrhoids 11/23 - PCCM consulted for pre-op risk assessment 11/24 - hemorrhoidectomy w/ reintubation in PACU and transfer to ICU 11/24 - Fever>>started on Vanc & Zosyn and cultured  11/26 - Patient had transient hypotension overnight after Lasix. Required gentle IV fluid bolus.Patient denies any pain or difficulty breathing this morning.   SUBJECTIVE/OVERNIGHT/INTERVAL HX 11/27 - mostlyu in bed. Due to surgical pain in rectal area finding it difficult to sit in chair. Not done PT. Off vent. On [place] o2. Shelley [name] nearby reports non-adherence to CPAP Qhs. Patient here using CPAP qhs and reports will be compliant. RN feels ok for floor transfer. Doublign of creat 05/09/2016 to 1.7mg % and CCS staretd fludis  Net only 2L piositive since admit    VITAL SIGNS: BP 103/62   Pulse 88   Temp 98.8 F (37.1 C) (Oral)   Resp 19   Ht 5\' 7"  (1.702 m)   Wt 107 kg (235 lb 14.3 oz)   SpO2 100%   BMI 36.95 kg/m   HEMODYNAMICS:     VENTILATOR SETTINGS:    INTAKE / OUTPUT: I/O last 3 completed shifts: In: 2013.7 [P.O.:600; I.V.:463.7; IV Piggyback:950] Out: 2440 [Urine:2440]  PHYSICAL EXAMINATION: General:  Awake. No distress. OBese Integument:  Warm & dry. Patchy erythematous rash within the fold under pannus unchanged. HEENT:  Moist mucus membranes.e. No scleral icterus. Cardiovascular:  Regular rate. No edema. Regular rhythm.  Pulmonary:  Clear with auscultation bilaterally. Symmetric chest wall rise r. Normal work  Abdomen: Soft. Normal bowel sounds. Protuberant. Nontender. Musculoskeletal:  No joint deformity or effusion appreciated. Neurological: Following commands. Moving all 4 extremities equally. Cranial nerves grossly intact.  LABS:  PULMONARY  Recent Labs Lab 05/06/16 1055 05/07/16 1204  PHART 7.498* 7.530*  PCO2ART 58.5* 45.4  PO2ART 267* 73.1*  HCO3 45.0* 37.8*  O2SAT 99.9 95.2    CBC  Recent Labs Lab 05/07/16 0401 05/08/16 0327 05/09/16 0318  HGB 8.0* 7.9* 8.0*  HCT 27.8* 27.1* 27.9*  WBC 6.0 6.1 3.8*  PLT 253 232 251    COAGULATION No results for input(s): INR in the last 168 hours.  CARDIAC  No results for input(s): TROPONINI in the last 168 hours. No results for input(s): PROBNP in the last 168 hours.   CHEMISTRY  Recent Labs Lab 05/03/16 1535 05/04/16 0335 05/06/16 1447 05/07/16 0401 05/08/16 0327 05/09/16 0318  NA 139 140 139 136  137 139 141  K 2.7* 3.4* 3.5 3.5  3.5 3.3* 3.2*  CL 88* 88* 88* 90*  91* 94* 98*  CO2 41* 46* 40* 36*  38*  37* 35*  GLUCOSE 155* 191* 187* 197*  201* 145* 132*  BUN 7 6 14 12  12 14 16   CREATININE 0.45 0.46 0.64 0.68  0.71 0.69 1.73*  CALCIUM 8.5* 8.3* 8.3* 8.0*  8.1* 8.1* 8.0*  MG 1.5*  --  1.4* 2.4 1.9 2.1  PHOS  --   --  2.1* 3.1 5.9* 5.3*   Estimated Creatinine Clearance: 49 mL/min (by C-G formula based on SCr of 1.73 mg/dL (H)).   LIVER  Recent Labs Lab 05/03/16 1535 05/06/16 1447 05/07/16 0401  05/08/16 0327 05/09/16 0318  AST 16  --   --   --   --   ALT 11*  --   --   --   --   ALKPHOS 63  --   --   --   --   BILITOT 0.4  --   --   --   --   PROT 7.7  --   --   --   --   ALBUMIN 3.3* 2.9* 2.7* 2.7* 2.5*     INFECTIOUS  Recent Labs Lab 05/06/16 2100 05/07/16 0401 05/08/16 0327  LATICACIDVEN 1.8  --   --   PROCALCITON <0.10 <0.10 <0.10     ENDOCRINE CBG (last 3)   Recent Labs  05/08/16 2341 05/09/16 0336 05/09/16 0806  GLUCAP 135* 135* 122*         IMAGING x48h  - image(s) personally visualized  -   highlighted in bold No results found.    ASSESSMENT / PLAN:  PULMONARY A: Acute on Chronic Hypoxic Respiratory Failure - Post-op with reintubation in PACU. Acute Respiratory Alkalosis - Likely due to over-ventilation. Possible Right Lower Lung Aspiration Pneumonia H/O Asthma H/O OSA/OHS - Nonadherent to BiPAP at home. VS patient. H/O Chronic Hypercarbic Respiratory Failure - ABG on BiPAP 17/11 w/ 4 L/m 7.32/87/141.    currently 05/09/2016  - appears baseline: Chronic Hypoxia - 2 L/m during the day & 4 L/m at night with BiPAP.    P:   Duoneb q6hr scheduled Continuing Budesonide 0.25mg  nebulized bid Continuing Singulair qhs   GASTROINTESTINAL A:   GIB from Hemorrhoids - S/P Hemorrhoidectomy. H/O GERD w/ Hiatal Hernia H/O AVM H/O Diverticulosis  - diet per CCS  P:   Per ccs Pepcid IV q12hr Post-op Surgical Care per General Surgery   HEMATOLOGIC A:   Anemia - Acute blood loss from hemorrhoids. S/P hemorrhoidectomy.   - stable anemia 05/09/2016  P:  Trending cell counts daily w/ CBC Holding home Niferex Capsule SCDs Transfuse for Hgb <7.0 or active bleeding  CARDIOVASCULAR A:  H/O Hyperlipidemia H/O HTN  P:  Vitals per unit protocol Goal MAP >65 Holding home Hyzaar Continuing home Lipitor qhs  RENAL A:   New AKI 05/09/2016 - probablu related to SIRS and hypotension 05/06/16 and ? antibiotuics *(See ID()  P:    Fluid bolus1L and then at 50cc/h  Recheck bmet at 3pm and then 05/10/16  Avoid nephrotoxin   INFECTIOUS  MICROBIOLOGY: MRSA PCR 11/24:  Negative  Tracheal Asp Ctx 11/24 >> Blood Ctx x2 11/24 >> Urine Ctx 11/24 >>   A:   Rash - Likely candida in fold of pannus.  FUO - 11/24. Possible Right Lower Lobe Aspiration Pneumonia  P:    ANTIBIOTICS: Clindamycin 11/24 (periop) Gentamycin 11/24 (periop) Vancomycin 11/24 - 11/26 Nystatin Powder 11/24 >> Zosyn 11/24 >>   ENDOCRINE A:   H/O DM - A1c 6.0 on 03/11/16.   P:  Holding home Metformin Accu-Checks q4hr SSI per Moderate Algorithm   NEUROLOGIC A:   Post-Operative Pain - improving H/O Intellectual Handicap  P:   RASS goal: 0 to -1 Dc Precedex gtt dcFentanyl IV prn Pain/Discomfort Dc Versed IV prn Sedation Do oxy po prn Continuing home Zoloft 100mg  VT daily   FAMILY  - Updates: Unable to reach family at any of the numbers listed in the electronic medical record. Message left on the voicemail of Laurina BustleDavid Ruth to contact our hospital on 11/24. PAtient and friend sally updated 05/09/2016   - Inter-disciplinary family meet or Palliative Care meeting due by:  12/1  TODAY'S SUM Move to medosurg. Continue cPAP qhs . Check bmet 05/09/2016 after IV fluids challenge. PT consult    Dr. Kalman ShanMurali Tresean Mattix, M.D., [facility] CountyF[facility]P Pulmonary and Critical Care Medicine Staff Physician [facility] [place] [facility] Pager: [phone], If no answer or between  15:00h - 7:00h: call [phone]  05/09/2016 10:43 AM

## 2016-05-09 NOTE — Progress Notes (Signed)
Patient transferred from ICU. Patient on tele monitor; vital signs stable.

## 2016-05-09 NOTE — Progress Notes (Signed)
Patient refusing chair at this time due to pressure on her rectum. Encouraged patient to walk in hall with RN patient refused and wishes to try again later. MD aware and PT ordered.

## 2016-05-09 NOTE — Progress Notes (Addendum)
Date:  May 09, 2016 Chart reviewed for concurrent status and case management needs. Will continue to follow patient progress. POD 3 Extubated on 40% O2 via Mask and [place] Discharge Planning: following for needs Expected discharge date: [id number] Marcelle SmilingRhonda Aarya Robinson, BSN, [place]RN3, [place]CCM   [phone]

## 2016-05-09 NOTE — Progress Notes (Addendum)
Pharmacy Antibiotic Note  Shelley [name] is a 50 y.o. female admitted on 05/03/2016 with bleeding hemorrhoids s/p hemorrhoidectomy on 11/24. Patient's currently on zosyn day #3 for suspected aspiration PNA.  - afeb, wbc low, scr up 1.73 (crcl~44 N)   Plan:  Continue Zosyn 3.375g IV Q8H infused over 4hrs.  Follow up renal fxn, culture results, and clinical course.  ______________________________   Height: 5\' 7"  (170.2 cm) Weight: 235 lb 14.3 oz (107 kg) IBW/kg (Calculated) : 61.6  Temp (24hrs), Avg:98.8 F (37.1 C), Min:97.7 F (36.5 C), Max:99.3 F (37.4 C)   Recent Labs Lab 05/04/16 0335  05/06/16 1447 05/06/16 2100 05/07/16 0401 05/08/16 0327 05/09/16 0318  WBC 4.0  < > 4.1 5.1 6.0 6.1 3.8*  CREATININE 0.46  --  0.64  --  0.68  0.71 0.69 1.73*  LATICACIDVEN  --   --   --  1.8  --   --   --   < > = values in this interval not displayed.  Estimated Creatinine Clearance: 49 mL/min (by C-G formula based on SCr of 1.73 mg/dL (H)).    Allergies  Allergen Reactions  . Fish Allergy Swelling  . Pepto-Bismol [Bismuth Subsalicylate] Nausea And Vomiting  . Cortisone Itching and Rash    Injections and cream only.  PO ok.  . Trichophyton Itching    Also sneezing accompanying as well   Antimicrobials this admission:  11/24 Zosyn >>  11/24 Vancomycin >> 11/26  Dose adjustments this admission:  n/a  Microbiology results:  11/24 BCx: NGTD 11/24 UCx: NG-F 11/24 Sputum: GPC, GPR, GNR, yeast FINAL 11/24 MRSA PCR, surgical screen: negative   Thank you for allowing pharmacy to be a part of this patient's care.  Dorna LeitzAnh Laquesha Holcomb, PharmD, BCPS 05/09/2016 11:06 AM

## 2016-05-09 NOTE — Progress Notes (Signed)
Placed pt on nocturnal bipap.  Attempted bipap settings of 17/11 per md rx, but pt unable to tolerate it stating the pressure was too much.  Bipap settings adjusted for pt comfort to autobipap, minimum epap=5cm, maximum ipap=20cm, with PS 4-8cm h2o with 4l o2 bleedin.  Pt tis tolerating well at this time.

## 2016-05-09 NOTE — Progress Notes (Signed)
Nutrition Follow-up  DOCUMENTATION CODES:   Obesity unspecified  INTERVENTION:  - Continue FLD and continue to advance diet as medically feasible. - RD will continue to monitor for needs.  NUTRITION DIAGNOSIS:   Inadequate oral intake related to inability to eat as evidenced by NPO status. -diet advanced starting 11/26; improving  GOAL:   Patient will meet greater than or equal to 90% of their needs -unmet at this time d/t diet order  MONITOR:   PO intake, Diet advancement, Weight trends, Labs, Skin, I & O's  ASSESSMENT:   50 y.o. female with a history of mild MR, diverticulosis, internal hemorrhoids, cecal AVM, GERD, OHS, OSA, asthma on home oxygen, and chronic anemia [facility].  11/27 Pt extubated yesterday at ~0900 and estimated nutrition needs updated this AM based on this event. Diet advanced from NPO to CLD yesterday at 1200 and again to FLD today at 0942. Pt consumed 50% of lunch and 100% of dinner yesterday. RD will continue to monitor for needs with ongoing diet advancement.  Medications reviewed; 20 mg IV Pepcid BID, sliding scale, 500 mg ascorbic acid BID. Labs reviewed; CBGs: 122-135 mg/dL, K: 3.2 mmol/L, Cl: 98 mmol/L, creatinine: 1.73 mg/dL, Ca: 8 mg/dL, GFR: 33 mL/min, Phos: 5.3 mg/dL, Mg WDL.   IVF: NS-20 mEq KCl @ 75 mL/hr.    11/24 - Pt seen for new vent.  - No OGT or NGT in place at this time.  - No family or visitors present to provide information from PTA. - Notes indicate that pt had hemorrhoid surgery last week and noted recurrent rectal bleeding PTA.  - Repeat surgery done this AM and pt remains intubated s/p procedure at this time.  - Physical assessment shows no muscle or fat wasting, mild edema to ankles.  - Per chart review, pt has lost 9 lbs (4% body weight) in the past 6 months which is not significant for time frame.   Patient is currently intubated on ventilator support MV: 9.3 L/min Temp  (24hrs), Avg:99.1 F (37.3 C), Min:99.1 F (37.3 C), Max:99.2 F (37.3 C) Propofol: none  Drip: Precedex @ 0.4 mcg/kg/hr.     Diet Order:  Diet - low sodium heart healthy Diet full liquid Room service appropriate? Yes; Fluid consistency: Thin  Skin:  Wound (see comment) (Rectal incision from hemorrhoid surgery)  Last BM:  11/27  Height:   Ht Readings from Last 1 Encounters:  05/06/16 5\' 7"  (1.702 m)    Weight:   Wt Readings from Last 1 Encounters:  05/03/16 235 lb 14.3 oz (107 kg)    Ideal Body Weight:  61.36 kg  BMI:  Body mass index is 36.95 kg/m.  Estimated Nutritional Needs:   Kcal:  1605-1820 (15-17 kcal/kg)  Protein:  74-86 grams (1.2-1.4 grams/kg IBW)  Fluid:  >/= 1.8 L/day  EDUCATION NEEDS:   No education needs identified at this time    Trenton GammonJessica Obryan Radu, MS, RD, LDN, CNSC Inpatient Clinical Dietitian Pager # [phone] After hours/weekend pager # [phone]

## 2016-05-10 ENCOUNTER — Inpatient Hospital Stay (HOSPITAL_COMMUNITY): Payer: Medicare Other

## 2016-05-10 DIAGNOSIS — N179 Acute kidney failure, unspecified: Secondary | ICD-10-CM

## 2016-05-10 LAB — CBC WITH DIFFERENTIAL/PLATELET
BASOS ABS: 0 10*3/uL (ref 0.0–0.1)
Basophils Relative: 1 %
Eosinophils Absolute: 0 10*3/uL (ref 0.0–0.7)
Eosinophils Relative: 1 %
HCT: 28.1 % — ABNORMAL LOW (ref 36.0–46.0)
Hemoglobin: 7.9 g/dL — ABNORMAL LOW (ref 12.0–15.0)
Lymphocytes Relative: 35 %
Lymphs Abs: 1.1 10*3/uL (ref 0.7–4.0)
MCH: 23 pg — ABNORMAL LOW (ref 26.0–34.0)
MCHC: 28.1 g/dL — AB (ref 30.0–36.0)
MCV: 81.9 fL (ref 78.0–100.0)
MONO ABS: 0.4 10*3/uL (ref 0.1–1.0)
Monocytes Relative: 12 %
NEUTROS ABS: 1.7 10*3/uL (ref 1.7–7.7)
Neutrophils Relative %: 51 %
PLATELETS: 281 10*3/uL (ref 150–400)
RBC: 3.43 MIL/uL — AB (ref 3.87–5.11)
RDW: 22.4 % — AB (ref 11.5–15.5)
WBC: 3.2 10*3/uL — AB (ref 4.0–10.5)

## 2016-05-10 LAB — BASIC METABOLIC PANEL
ANION GAP: 7 (ref 5–15)
BUN: 13 mg/dL (ref 6–20)
CALCIUM: 8.3 mg/dL — AB (ref 8.9–10.3)
CHLORIDE: 103 mmol/L (ref 101–111)
CO2: 33 mmol/L — AB (ref 22–32)
Creatinine, Ser: 1.84 mg/dL — ABNORMAL HIGH (ref 0.44–1.00)
GFR calc non Af Amer: 31 mL/min — ABNORMAL LOW (ref >60)
GFR, EST AFRICAN AMERICAN: 36 mL/min — AB (ref >60)
GLUCOSE: 146 mg/dL — AB (ref 65–99)
POTASSIUM: 4.1 mmol/L (ref 3.5–5.1)
Sodium: 143 mmol/L (ref 135–145)

## 2016-05-10 LAB — SODIUM, URINE, RANDOM: SODIUM UR: 28 mmol/L

## 2016-05-10 LAB — GLUCOSE, CAPILLARY
GLUCOSE-CAPILLARY: 135 mg/dL — AB (ref 65–99)
Glucose-Capillary: 205 mg/dL — ABNORMAL HIGH (ref 65–99)
Glucose-Capillary: 112 mg/dL — ABNORMAL HIGH (ref 65–99)
Glucose-Capillary: 172 mg/dL — ABNORMAL HIGH (ref 65–99)

## 2016-05-10 LAB — MAGNESIUM: Magnesium: 2.1 mg/dL (ref 1.7–2.4)

## 2016-05-10 LAB — CREATININE, URINE, RANDOM: CREATININE, URINE: 31.85 mg/dL

## 2016-05-10 MED ORDER — FAMOTIDINE 20 MG PO TABS
20.0000 mg | ORAL_TABLET | Freq: Every day | ORAL | Status: DC
  Administered 2016-05-11 – 2016-05-12 (×2): 20 mg via ORAL
  Filled 2016-05-10 (×2): qty 1

## 2016-05-10 NOTE — Progress Notes (Signed)
Patient ID: Shelley [name], female   DOB: 03/17/1966, 50 y.o.   MRN: [id number]  [facility]  4 Days Post-Op  Subjective: 1 large and 2 small BM's overnight/this morning. Nurse reports that they were loose and burgundy in color.  Heparin restarted yesterday.  Objective: Vital signs in last 24 hours: Temp:  [97.8 F (36.6 C)-98.6 F (37 C)] 97.8 F (36.6 C) (11/28 0617) Pulse Rate:  [72-88] 72 (11/28 0617) Resp:  [13-24] 20 (11/28 0617) BP: (103-157)/(56-89) 123/73 (11/28 0617) SpO2:  [95 %-100 %] 100 % (11/28 0617) Last BM Date: 05/09/16  Intake/Output from previous day: 11/27 0701 - 11/28 0700 In: 1550 [P.O.:240; I.V.:1160; IV Piggyback:150] Out: 400 [Urine:300; Stool:100] Intake/Output this shift: Total I/O In: -  Out: 202 [Urine:200; Stool:2]  PE: Gen:  Alert, NAD, pleasant Pulm:  On biPAP Abd: Soft, NT/ND, area under panus with power in place and no erythema Rectum: no active bleeding  Lab Results:   Recent Labs  05/08/16 0327 05/09/16 0318  WBC 6.1 3.8*  HGB 7.9* 8.0*  HCT 27.1* 27.9*  PLT 232 251   BMET  Recent Labs  05/09/16 1548 05/10/16 0557  NA 139 143  K 3.3* 4.1  CL 99* 103  CO2 32 33*  GLUCOSE 160* 146*  BUN 15 13  CREATININE 1.84* 1.84*  CALCIUM 7.8* 8.3*   PT/INR No results for input(s): LABPROT, INR in the last 72 hours. CMP     Component Value Date/Time   NA 143 05/10/2016 0557   K 4.1 05/10/2016 0557   CL 103 05/10/2016 0557   CO2 33 (H) 05/10/2016 0557   GLUCOSE 146 (H) 05/10/2016 0557   BUN 13 05/10/2016 0557   CREATININE 1.84 (H) 05/10/2016 0557   CALCIUM 8.3 (L) 05/10/2016 0557   PROT 7.7 05/03/2016 1535   ALBUMIN 2.5 (L) 05/09/2016 0318   AST 16 05/03/2016 1535   ALT 11 (L) 05/03/2016 1535   ALKPHOS 63 05/03/2016 1535   BILITOT 0.4 05/03/2016 1535   GFRNONAA 31 (L) 05/10/2016 0557   GFRAA 36 (L) 05/10/2016 0557   Lipase  No results found for: LIPASE     Studies/Results: No  results found.  Anti-infectives: Anti-infectives    Start     Dose/Rate Route Frequency Ordered Stop   05/07/16 0600  vancomycin (VANCOCIN) IVPB 1000 mg/200 mL premix  Status:  Discontinued     1,000 mg 200 mL/hr over 60 Minutes Intravenous Every 8 hours 05/06/16 2049 05/08/16 0828   05/06/16 2030  piperacillin-tazobactam (ZOSYN) IVPB 3.375 g     3.375 g 12.5 mL/hr over 240 Minutes Intravenous Every 8 hours 05/06/16 2017     05/06/16 2030  vancomycin (VANCOCIN) 2,000 mg in sodium chloride 0.9 % 500 mL IVPB     2,000 mg 250 mL/hr over 120 Minutes Intravenous  Once 05/06/16 2018 05/06/16 2258   05/06/16 0600  clindamycin (CLEOCIN) IVPB 900 mg     900 mg 100 mL/hr over 30 Minutes Intravenous On call to O.R. 05/05/16 1644 05/06/16 0756   05/06/16 0600  gentamicin (GARAMYCIN) 540 mg in dextrose 5 % 100 mL IVPB     5 mg/kg  107 kg 227 mL/hr over 30 Minutes Intravenous On call to O.R. 05/05/16 1644 05/06/16 0755       Assessment/Plan Bleeding internal hemorrhoids with symptomatic anemia S/p EXAM UNDER ANESTHESIA, HEMORRHOIDECTOMY,HEMORRHOID LIGATION, 05/06/16, Dr. Viviann SpareSteven GRoss  POD 4 Acute on Chronic Hypoxic Respiratory Failure - Post-op with reintubation in  PACU. Acute Respiratory Alkalosis - Likely due to over-ventilation. Suspected Right Lower Lung Aspiration Pneumonia H/O Asthma Acute kidney injury - creatinine is up to 1.84 this AM GERD/Hiatal hernia/Hx of AVM Anemia Rash - Likely candida in fold of pannus (Body mass index is 36.95) - improving AODM  FEN: FLD advance as tolerated ID: Vancomycin d/ced 05/07/16 after 2 days/Zosyn 05/06/16 =>> day 5 DVT:  SCD, heparin  Plan:  hg stable. 7.9 today from 8.0 yesterday. Continue sitz bath. Advance diet as tolearted.  No NSAIDS with bleeding issue.    LOS: 7 days    Edson SnowballBROOKE A MILLER , [facility] 05/10/2016, 9:50 AM Pager: [phone] Consults: [phone] Mon-Fri 7:00 am-4:30 pm Sat-Sun 7:00 am-11:30  am

## 2016-05-10 NOTE — Care Management Important Message (Signed)
Important Message  Patient Details  Name: Shelley [name] MRN: [id number] Date of Birth: 06/18/1965   Medicare Important Message Given:  Yes    Haskell FlirtJamison, Farron Watrous 05/10/2016, 12:13 PMImportant Message  Patient Details  Name: Shelley [name] MRN: [id number] Date of Birth: 10/04/1965   Medicare Important Message Given:  Yes    Haskell FlirtJamison, Mivaan Corbitt 05/10/2016, 12:13 PM

## 2016-05-10 NOTE — Progress Notes (Signed)
PROGRESS NOTE    Shelley [name]  WUJ:[id number] DOB: 1966/03/29 DOA: 05/03/2016 PCP: Astrid Divine, MD   Brief Narrative:  50 y.o. female patient of Dr. Adria [name]. Known history of OSA/OHS on home BiPAP, Patient  on oxygen chronically at 2 L/m as well as 4 L/m at night while sleeping. Also has a known history of asthma. Patient has a history of nonadherence to noninvasive positive pressure ventilation at home. Patient presented with symptomatic anemia due to bleeding from hemorrhoids. Dr. Michaell Cowing performed hemorrhoidectomy on 11/23. Patient required reintubation and postanesthesia care unit and subsequently transferred to the intensive care unit for further treatment and stabilization postoperatively. She was subsequently transferred to telemetry to [facility] service.    Assessment & Plan:   Principal Problem:   Acute lower GI bleeding Active Problems:   Cognitive developmental delay   Obstructive sleep apnea   Chronic respiratory failure (HCC)   Iron deficiency anemia due to chronic blood loss   Hypokalemia   Diabetes mellitus (HCC)   Obesity, Class III, BMI 40-49.9 (morbid obesity) (HCC)   Bleeding internal hemorrhoids   Dependence on supplemental oxygen   Anxiety   Mental retardation   History of arteriovenous malformation (AVM)   Gastro-esophageal reflux disease without esophagitis   Acute blood loss anemia   Bleeding gastrointestinal   Obesity hypoventilation syndrome (HCC)   Chronic constipation   AVM (arteriovenous malformation) of colon   AKI (acute kidney injury) (HCC)   Acute on chronic respiratory failure (HCC)   Acute on Chronic respiratory failure:  Post intubation in PACU. , possibly from right LL pneumonia.  Currently on zosyn, to complete the course of antibiotic.   Asthma exacerbation: resolved.  Currently not wheezing.  Use bronchodilators as needed. At baseline pt is on 2 lit of [place] during the day and on 4 lit at night.    Diabetes Mellitus: CBG (last  3)   Recent Labs  05/10/16 0726 05/10/16 1226 05/10/16 1609  GLUCAP 135* 205* 112*    Resume SSI.  hgba1c is 6.    Candidal rash: on nystatin powder.    Acute kidney injury: Possibly from hypotension, use of amino glycosides.  Avoid nephrotoxins.  Get US renal.  Urine electrolytes, strict intake and output and monitor renal parameters, if not improving, please call nephrology in am.    Symptomatic anemia/ anemia of blood loss from hemorrhoids: S/p hemorrhoidectomy .  Monitor hemoglobin.  Transfuse to keep hemoglobin greater than 7.    Hypertension: controlled.      DVT prophylaxis: scd's Code Status: (Full/) Family Communication: NONE  At bedside.  Disposition Plan: pending further eval.   Consultants:   Surgery  PCCM.   Procedures: hemorrhoidectomy   Antimicrobials: zosyn 11/24   Subjective: No new complaints.   Objective: Vitals:   05/09/16 2153 05/09/16 2331 05/10/16 0617 05/10/16 0955  BP: (!) 111/56  123/73   Pulse: 82 79 72   Resp: 20 20 20    Temp: 98.6 F (37 C)  97.8 F (36.6 C)   TempSrc: Oral  Oral   SpO2: 95%  100% 97%  Weight:      Height:        Intake/Output Summary (Last 24 hours) at 05/10/16 1405 Last data filed at 05/10/16 0928  Gross per 24 hour  Intake             1240 ml  Output              602 ml  Net  638 ml   Filed Weights   05/03/16 1905  Weight: 107 kg (235 lb 14.3 oz)    Examination:  General exam: Appears depressed.  Respiratory system: Clear to auscultation. Respiratory effort normal. Cardiovascular system: S1 & S2 heard, RRR. No JVD, murmurs, rubs, gallops or clicks. No pedal edema. Gastrointestinal system: Abdomen is nondistended, soft and nontender. No organomegaly or masses felt. Normal bowel sounds heard. Central nervous system: Alert No focal neurological deficits. Extremities: Symmetric 5 x 5 power. Skin: No rashes, lesions or ulcers     Data Reviewed: I have personally  reviewed following labs and imaging studies  CBC:  Recent Labs Lab 05/06/16 2100 05/07/16 0401 05/08/16 0327 05/09/16 0318 05/10/16 0945  WBC 5.1 6.0 6.1 3.8* 3.2*  NEUTROABS 2.9 3.1 2.7 2.2 1.7  HGB 8.6* 8.0* 7.9* 8.0* 7.9*  HCT 29.7* 27.8* 27.1* 27.9* 28.1*  MCV 78.8 79.7 79.2 80.9 81.9  PLT 263 253 232 251 281   Basic Metabolic Panel:  Recent Labs Lab 05/06/16 1447 05/07/16 0401 05/08/16 0327 05/09/16 0318 05/09/16 1548 05/10/16 0557  NA 139 136  137 139 141 139 143  K 3.5 3.5  3.5 3.3* 3.2* 3.3* 4.1  CL 88* 90*  91* 94* 98* 99* 103  CO2 40* 36*  38* 37* 35* 32 33*  GLUCOSE 187* 197*  201* 145* 132* 160* 146*  BUN 14 12  12 14 16 15 13   CREATININE 0.64 0.68  0.71 0.69 1.73* 1.84* 1.84*  CALCIUM 8.3* 8.0*  8.1* 8.1* 8.0* 7.8* 8.3*  MG 1.4* 2.4 1.9 2.1  --  2.1  PHOS 2.1* 3.1 5.9* 5.3*  --   --    GFR: Estimated Creatinine Clearance: 46.1 mL/min (by C-G formula based on SCr of 1.84 mg/dL (H)). Liver Function Tests:  Recent Labs Lab 05/03/16 1535 05/06/16 1447 05/07/16 0401 05/08/16 0327 05/09/16 0318  AST 16  --   --   --   --   ALT 11*  --   --   --   --   ALKPHOS 63  --   --   --   --   BILITOT 0.4  --   --   --   --   PROT 7.7  --   --   --   --   ALBUMIN 3.3* 2.9* 2.7* 2.7* 2.5*   No results for input(s): LIPASE, AMYLASE in the last 168 hours. No results for input(s): AMMONIA in the last 168 hours. Coagulation Profile: No results for input(s): INR, PROTIME in the last 168 hours. Cardiac Enzymes: No results for input(s): CKTOTAL, CKMB, CKMBINDEX, TROPONINI in the last 168 hours. BNP (last 3 results) No results for input(s): PROBNP in the last 8760 hours. HbA1C: No results for input(s): HGBA1C in the last 72 hours. CBG:  Recent Labs Lab 05/09/16 1248 05/09/16 1608 05/09/16 2205 05/10/16 0726 05/10/16 1226  GLUCAP 152* 137* 183* 135* 205*   Lipid Profile: No results for input(s): CHOL, HDL, LDLCALC, TRIG, CHOLHDL, LDLDIRECT in  the last 72 hours. Thyroid Function Tests: No results for input(s): TSH, T4TOTAL, FREET4, T3FREE, THYROIDAB in the last 72 hours. Anemia Panel: No results for input(s): VITAMINB12, FOLATE, FERRITIN, TIBC, IRON, RETICCTPCT in the last 72 hours. Sepsis Labs:  Recent Labs Lab 05/06/16 2100 05/07/16 0401 05/08/16 0327  PROCALCITON <0.10 <0.10 <0.10  LATICACIDVEN 1.8  --   --     Recent Results (from the past 240 hour(s))  Surgical pcr screen  Status: None   Collection Time: 05/06/16 12:02 AM  Result Value Ref Range Status   MRSA, PCR NEGATIVE NEGATIVE Final   Staphylococcus aureus NEGATIVE NEGATIVE Final    Comment:        The Xpert SA Assay (FDA approved for NASAL specimens in patients over 50 years of age), is one component of a comprehensive surveillance program.  Test performance has been validated by [facility] for patients greater than or equal to 50 year old. It is not intended to diagnose infection nor to guide or monitor treatment.   Culture, respiratory (NON-Expectorated)     Status: None   Collection Time: 05/06/16  8:47 PM  Result Value Ref Range Status   Specimen Description TRACHEAL ASPIRATE  Final   Special Requests Normal  Final   Gram Stain   Final    ABUNDANT WBC PRESENT, PREDOMINANTLY PMN RARE SQUAMOUS EPITHELIAL CELLS PRESENT RARE BUDDING YEAST SEEN RARE GRAM POSITIVE COCCI IN PAIRS RARE GRAM VARIABLE ROD    Culture   Final    Consistent with normal respiratory flora. Performed at [facility]    Report Status 05/09/2016 FINAL  Final  Culture, blood (Routine X 2) w Reflex to ID Panel     Status: None (Preliminary result)   Collection Time: 05/06/16  9:00 PM  Result Value Ref Range Status   Specimen Description BLOOD LEFT ARM  Final   Special Requests BOTTLES DRAWN AEROBIC AND ANAEROBIC 8 CC  Final   Culture   Final    NO GROWTH 4 DAYS Performed at [facility]    Report Status PENDING  Incomplete  Culture, blood (Routine  X 2) w Reflex to ID Panel     Status: None (Preliminary result)   Collection Time: 05/06/16  9:00 PM  Result Value Ref Range Status   Specimen Description BLOOD RIGHT HAND  Final   Special Requests BOTTLES DRAWN AEROBIC AND ANAEROBIC 5 CC  Final   Culture   Final    NO GROWTH 4 DAYS Performed at [facility]    Report Status PENDING  Incomplete  Culture, Urine     Status: None   Collection Time: 05/06/16  9:11 PM  Result Value Ref Range Status   Specimen Description URINE, CLEAN CATCH  Final   Special Requests Normal  Final   Culture NO GROWTH Performed at [facility]   Final   Report Status 05/08/2016 FINAL  Final         Radiology Studies: No results found.      Scheduled Meds: . atorvastatin  10 mg Oral q1800  . budesonide  0.25 mg Nebulization BID  . famotidine  20 mg Oral BID  . hydrocortisone-pramoxine  1 application Rectal QID  . insulin aspart  0-20 Units Subcutaneous TID WC  . insulin aspart  0-5 Units Subcutaneous QHS  . ipratropium-albuterol  3 mL Nebulization Q6H WA  . lip balm  1 application Topical BID  . montelukast  10 mg Oral QHS  . nystatin   Topical TID  . piperacillin-tazobactam (ZOSYN)  IV  3.375 g Intravenous Q8H  . sertraline  100 mg Oral Daily  . vitamin C  500 mg Oral BID   Continuous Infusions: . sodium chloride 50 mL/hr at 05/09/16 1700     LOS: 7 days    Time spent: 25 minutes.     Kathlen ModyAKULA,Maxwell Martorano, MD [facility] Pager [phone]  If 7PM-7AM, please contact night-coverage [email or web address] Password [email or web address] 05/10/2016, 2:05  PM

## 2016-05-10 NOTE — Evaluation (Signed)
Physical Therapy Evaluation Patient Details Name: Shelley [name] A Sholl MRN: [id number] DOB: 05/28/1966 Today's Date: 05/10/2016   History of Present Illness  Patient is a 50 yo female admitted 05/03/16 with rectal bleed.  s/p hemorrhoidectomy 05/05/16, pt with post op VDRF, extubated11/26/17.   PMH:  home O2, mental retardation, anxiety, arthritis, DM, GIB, HTN  Clinical Impression  Pt admitted with above diagnosis. Pt currently with functional limitations due to the deficits listed below (see PT Problem List). Pt ambulated 30' with RW and 3L O2, no loss of balance, distance limited by fatigue. Increased assistance from her aide is recommended.  Pt will benefit from skilled PT to increase their independence and safety with mobility to allow discharge to the venue listed below.       Follow Up Recommendations Home health PT;Supervision for mobility/OOB; assistance from aide daily (PTA aide came M, W, F)    Equipment Recommendations  None recommended by PT    Recommendations for Other Services       Precautions / Restrictions Precautions Precautions: Fall Precaution Comments: 2 falls in past 1 year Restrictions Weight Bearing Restrictions: No      Mobility  Bed Mobility Overal bed mobility: Needs Assistance Bed Mobility: Supine to Sit     Supine to sit: Mod assist     General bed mobility comments: assist to raise trunk  Transfers Overall transfer level: Needs assistance Equipment used: Rolling walker (2 wheeled) Transfers: Sit to/from Stand Sit to Stand: Min guard         General transfer comment: heavy use of BUEs to push up  Ambulation/Gait Ambulation/Gait assistance: Min guard Ambulation Distance (Feet): 30 Feet Assistive device: Rolling walker (2 wheeled) Gait Pattern/deviations: Step-to pattern;Trunk flexed   Gait velocity interpretation: Below normal speed for age/gender General Gait Details: steady with RW, distance limited by fatigue, ambulated with 3L O2 [place]  (this is her baseline), no LOB  Stairs            Wheelchair Mobility    Modified Rankin (Stroke Patients Only)       Balance Overall balance assessment: History of Falls;Needs assistance   Sitting balance-Leahy Scale: Good       Standing balance-Leahy Scale: Fair                               Pertinent Vitals/Pain Pain Assessment: 0-10 Pain Score: 10-Worst pain ever Pain Location: rectum Pain Descriptors / Indicators: Sore Pain Intervention(s): Limited activity within patient's tolerance;Monitored during session;Patient requesting pain meds-RN notified    Home Living Family/patient expects to be discharged to:: Private residence Living Arrangements: Alone Available Help at Discharge: Personal care attendant Type of Home: Apartment Home Access: Level entry     Home Layout: One level Home Equipment: Walker - 4 wheels;Shower seat;Grab bars - toilet;Grab bars - tub/shower Additional Comments: Pt has caregiver come 2-3 hours in the morning M, W, F  to help her with ADL's and goes to the dining room for meals.     Prior Function Level of Independence: Needs assistance   Gait / Transfers Assistance Needed: Ambulates with rollator  ADL's / Homemaking Assistance Needed: aide assists with bathing/dressing and transportation        Hand Dominance        Extremity/Trunk Assessment   Upper Extremity Assessment: Overall WFL for tasks assessed           Lower Extremity Assessment: Overall WFL for tasks assessed  Cervical / Trunk Assessment: Normal  Communication   Communication: No difficulties  Cognition Arousal/Alertness: Awake/alert Behavior During Therapy: WFL for tasks assessed/performed Overall Cognitive Status: Within Functional Limits for tasks assessed                      General Comments      Exercises     Assessment/Plan    PT Assessment Patient needs continued PT services  PT Problem List Decreased  activity tolerance;Decreased balance;Decreased mobility;Pain          PT Treatment Interventions Gait training;Therapeutic exercise;Therapeutic activities;Functional mobility training;Patient/family education    PT Goals (Current goals can be found in the Care Plan section)  Acute Rehab PT Goals Patient Stated Goal: return home PT Goal Formulation: With patient Time For Goal Achievement: 05/24/16 Potential to Achieve Goals: Good    Frequency Min 3X/week   Barriers to discharge Decreased caregiver support pt would benefit from increased days/hours from her aide, daily assistance recommended    Co-evaluation               End of Session Equipment Utilized During Treatment: Gait belt;Oxygen Activity Tolerance: Patient tolerated treatment well;No increased pain Patient left: in chair;with call bell/phone within reach (NT to place chair alarm pad) Nurse Communication: Mobility status         Time: [id number] PT Time Calculation (min) (ACUTE ONLY): 23 min   Charges:   PT Evaluation $PT Eval Low Complexity: 1 Procedure PT Treatments $Gait Training: 8-22 mins   PT G Codes:        Tamala SerUhlenberg, Ece [place] Kistler 05/10/2016, 3:01 PM [phone]

## 2016-05-10 NOTE — Progress Notes (Addendum)
[place] PCCM progress note  S: denies complaiints worse cpap   O Vitals:   05/09/16 2053 05/09/16 2153 05/09/16 2331 05/10/16 0617  BP:  (!) 111/56  123/73  Pulse:  82 79 72  Resp:  20 20 20   Temp:  98.6 F (37 C)  97.8 F (36.6 C)  TempSrc:  Oral  Oral  SpO2: 100% 95%  100%  Weight:      Height:        Physica Obese CTA bilaterally Oreinted x3 Looks euvolemic Normal heart sounds Soft abdomen   Recent Labs Lab 05/07/16 0401 05/08/16 0327 05/09/16 0318 05/09/16 1548 05/10/16 0557  CREATININE 0.68  0.71 0.69 1.73* 1.84* 1.84*    Recent Labs Lab 05/06/16 1447 05/07/16 0401 05/08/16 0327 05/09/16 0318 05/09/16 1548 05/10/16 0557  NA 139 136  137 139 141 139 143  K 3.5 3.5  3.5 3.3* 3.2* 3.3* 4.1  CL 88* 90*  91* 94* 98* 99* 103  CO2 40* 36*  38* 37* 35* 32 33*  GLUCOSE 187* 197*  201* 145* 132* 160* 146*  BUN 14 12  12 14 16 15 13   CREATININE 0.64 0.68  0.71 0.69 1.73* 1.84* 1.84*  CALCIUM 8.3* 8.0*  8.1* 8.1* 8.0* 7.8* 8.3*  MG 1.4* 2.4 1.9 2.1  --  2.1  PHOS 2.1* 3.1 5.9* 5.3*  --   --      I/O last 3 completed shifts: In: 1720 [P.O.:240; I.V.:1180; IV Piggyback:300] Out: 400 [Urine:300; Stool:100] I/O last 3 completed shifts: In: 1720 [P.O.:240; I.V.:1180; IV Piggyback:300] Out: 400 [Urine:300; Stool:100]  A) #Sleep apnea - Stable:  wore CPAP last night  # Acute Kidney Injyury: Worsening creat, Bic/K still ok. Seems related to hypotension +/- aminoglycsoidses from 2-3 days ago. Did not responds to fluids yesterday. Does not seem to be on nephrotoxins currently  Plan  - [facility] to address and monitor for renal function. D - OSA fu with DR Craige CottaSood -  Future Appointments Date Time Provider Department Center  07/07/2016 11:30 AM Coralyn HellingVineet Sood, MD [facility] None     pccm wil sign off. D/w Dr Blake DivineAkula   Dr. Kalman ShanMurali Adelard Sanon, M.D., [facility] Pulmonary and Critical Care Medicine Staff Physician [facility] [facility]  Pulmonary and  Critical Care Pager: [phone], If no answer or between  15:00h - 7:00h: call [phone]  05/10/2016 8:49 AM

## 2016-05-11 DIAGNOSIS — Z9981 Dependence on supplemental oxygen: Secondary | ICD-10-CM

## 2016-05-11 DIAGNOSIS — D500 Iron deficiency anemia secondary to blood loss (chronic): Secondary | ICD-10-CM

## 2016-05-11 DIAGNOSIS — J9610 Chronic respiratory failure, unspecified whether with hypoxia or hypercapnia: Secondary | ICD-10-CM

## 2016-05-11 DIAGNOSIS — E119 Type 2 diabetes mellitus without complications: Secondary | ICD-10-CM

## 2016-05-11 DIAGNOSIS — K625 Hemorrhage of anus and rectum: Secondary | ICD-10-CM

## 2016-05-11 DIAGNOSIS — K219 Gastro-esophageal reflux disease without esophagitis: Secondary | ICD-10-CM

## 2016-05-11 LAB — SODIUM, URINE, RANDOM: SODIUM UR: 44 mmol/L

## 2016-05-11 LAB — CBC WITH DIFFERENTIAL/PLATELET
BASOS ABS: 0 10*3/uL (ref 0.0–0.1)
Basophils Relative: 0 %
Eosinophils Absolute: 0 10*3/uL (ref 0.0–0.7)
Eosinophils Relative: 1 %
HEMATOCRIT: 26.3 % — AB (ref 36.0–46.0)
Hemoglobin: 7.4 g/dL — ABNORMAL LOW (ref 12.0–15.0)
LYMPHS ABS: 1.5 10*3/uL (ref 0.7–4.0)
Lymphocytes Relative: 44 %
MCH: 23.3 pg — ABNORMAL LOW (ref 26.0–34.0)
MCHC: 28.1 g/dL — ABNORMAL LOW (ref 30.0–36.0)
MCV: 82.7 fL (ref 78.0–100.0)
MONOS PCT: 13 %
Monocytes Absolute: 0.4 10*3/uL (ref 0.1–1.0)
NEUTROS PCT: 42 %
Neutro Abs: 1.3 10*3/uL — ABNORMAL LOW (ref 1.7–7.7)
PLATELETS: 261 10*3/uL (ref 150–400)
RBC: 3.18 MIL/uL — AB (ref 3.87–5.11)
RDW: 22.2 % — AB (ref 11.5–15.5)
WBC: 3.2 10*3/uL — AB (ref 4.0–10.5)

## 2016-05-11 LAB — URINE MICROSCOPIC-ADD ON

## 2016-05-11 LAB — BASIC METABOLIC PANEL
ANION GAP: 7 (ref 5–15)
ANION GAP: 6 (ref 5–15)
BUN: 17 mg/dL (ref 6–20)
BUN: 18 mg/dL (ref 6–20)
CHLORIDE: 105 mmol/L (ref 101–111)
CHLORIDE: 104 mmol/L (ref 101–111)
CO2: 31 mmol/L (ref 22–32)
CO2: 31 mmol/L (ref 22–32)
Calcium: 8.3 mg/dL — ABNORMAL LOW (ref 8.9–10.3)
Calcium: 8.4 mg/dL — ABNORMAL LOW (ref 8.9–10.3)
Creatinine, Ser: 1.9 mg/dL — ABNORMAL HIGH (ref 0.44–1.00)
Creatinine, Ser: 1.81 mg/dL — ABNORMAL HIGH (ref 0.44–1.00)
GFR calc non Af Amer: 30 mL/min — ABNORMAL LOW (ref >60)
GFR calc non Af Amer: 32 mL/min — ABNORMAL LOW (ref >60)
GFR, EST AFRICAN AMERICAN: 34 mL/min — AB (ref >60)
GFR, EST AFRICAN AMERICAN: 37 mL/min — AB (ref >60)
Glucose, Bld: 145 mg/dL — ABNORMAL HIGH (ref 65–99)
Glucose, Bld: 126 mg/dL — ABNORMAL HIGH (ref 65–99)
POTASSIUM: 4.2 mmol/L (ref 3.5–5.1)
Potassium: 3.7 mmol/L (ref 3.5–5.1)
SODIUM: 143 mmol/L (ref 135–145)
Sodium: 141 mmol/L (ref 135–145)

## 2016-05-11 LAB — URINALYSIS, ROUTINE W REFLEX MICROSCOPIC
BILIRUBIN URINE: NEGATIVE
Glucose, UA: NEGATIVE mg/dL
Ketones, ur: NEGATIVE mg/dL
Nitrite: NEGATIVE
PH: 6 (ref 5.0–8.0)
Protein, ur: NEGATIVE mg/dL
SPECIFIC GRAVITY, URINE: 1.008 (ref 1.005–1.030)

## 2016-05-11 LAB — CULTURE, BLOOD (ROUTINE X 2)
CULTURE: NO GROWTH
Culture: NO GROWTH

## 2016-05-11 LAB — GLUCOSE, CAPILLARY
GLUCOSE-CAPILLARY: 133 mg/dL — AB (ref 65–99)
GLUCOSE-CAPILLARY: 148 mg/dL — AB (ref 65–99)
GLUCOSE-CAPILLARY: 118 mg/dL — AB (ref 65–99)
Glucose-Capillary: 177 mg/dL — ABNORMAL HIGH (ref 65–99)

## 2016-05-11 LAB — CREATININE, URINE, RANDOM: Creatinine, Urine: 31.55 mg/dL

## 2016-05-11 LAB — VANCOMYCIN, TROUGH: Vancomycin Tr: 7 ug/mL — ABNORMAL LOW (ref 15–20)

## 2016-05-11 NOTE — Consult Note (Addendum)
[facility] Consult [name]  Shelley [name] 05/11/2016 Shelley [name],Shelley Palazzo D Requesting Physician:  Dr Janee MornHompson  Reason for Consult:  AKI HPI: The patient is a 50 y.o. year-old with history of mental retardation, HTN, recurrent rectal bleeding, hemorrhoids/ cecal AVM, DM2, chron resp failure from OSA/ OHS/ asthma on home O2, depression / anxiety ; pt admitted on 05/03/16 with rectal bleeding x 1 mo and Hb 7 w recent hemorrhoidectomy.  Renal function normal on admission w creat 0.4 but on 11/27 creat bumped up to 1.7, yest 1.8 and today 1.9.  Asked to see for AKI.   Patient w/o complaints, came in for "bleeding".  Went for surg on 11/24 with hemorrhoidectomy and hemorrhoid ligation.  Hb's have been 7-8 range since surgery.  BP's have been stable throughout stay here. No BP meds.  Has rec'd IV nsaid, vanc, high-dose gent and lasix for a few days as below.  No contrast.    Pt lives alone, never married, no kids. Went to [employer] through [employer].  Worked in a daycare.  Rito EhrlichFriend Sally does takes care of her medications.  Pt doesn't know her health insurance carrier.  Likes to read novels and watch [employer] on TV.  Didn't go to college.  Has a WC and a walker at home.  Is on home O2.    IP meds > clinda 900 mg and gent 5mg /kg x 1 dose on 05/06/16 peri-op Lasix 20 mg IV x 3 over first 5d inpt Ibuprofen IV x 1 11/25 Vanc IV 6 gm over 48 hrs 11/24-25 Neurontin, hep SQ, insulin, singulair, nystatin, PPI, miralax, KCl, zoloft, pepcid, lipitor, fentanyl   ROS  denies CP  no joint pain   no HA  no blurry vision  no rash  no diarrhea  no nausea/ vomiting  no dysuria  no difficulty voiding  no change in urine color    Past Medical History  Past Medical History:  Diagnosis Date  . Anxiety   . Arthritis   . Asthma   . AVM (arteriovenous malformation) of colon   . Diabetes mellitus   . Diverticulosis   . GERD (gastroesophageal reflux disease)   . GI bleed 09/2015  .  Hemorrhoids    Internal and external  . Hiatal hernia   . Hyperlipidemia   . Hypertension   . Mental retardation   . Pneumonia    Past Surgical History  Past Surgical History:  Procedure Laterality Date  . CHOLECYSTECTOMY     2006  . COLONOSCOPY N/A 02/06/2013   Procedure: COLONOSCOPY;  Surgeon: Theda BelfastPatrick D Hung, MD;  Location: [facility]COPY;  Service: Endoscopy;  Laterality: N/A;  . COLONOSCOPY N/A 07/31/2015   Procedure: COLONOSCOPY;  Surgeon: Jeani HawkingPatrick Hung, MD;  [facility];  Service: Endoscopy;  Laterality: N/A;  . ESOPHAGOGASTRODUODENOSCOPY N/A 02/06/2013   Procedure: ESOPHAGOGASTRODUODENOSCOPY (EGD);  Surgeon: Theda BelfastPatrick D Hung, MD;  [facility];  Service: Endoscopy;  Laterality: N/A;  . EYE SURGERY    . FLEXIBLE SIGMOIDOSCOPY N/A 09/18/2015   Procedure: FLEXIBLE SIGMOIDOSCOPY;  Surgeon: Jeani HawkingPatrick Hung, MD;  [facility];  Service: Endoscopy;  Laterality: N/A;  . HEMORRHOID SURGERY N/A 05/06/2016   Procedure: HEMORRHOIDECTOMY, HEMORRHOID LIGATION;  Surgeon: Karie SodaSteven Gross, MD;  Location: [facility] ORS;  Service: General;  Laterality: N/A;  . HOT HEMOSTASIS N/A 07/31/2015   Procedure: HOT HEMOSTASIS (ARGON PLASMA COAGULATION/BICAP);  Surgeon: Jeani HawkingPatrick Hung, MD;  [facility];  Service: Endoscopy;  Laterality: N/A;   Family History  Family History  Problem Relation Age of Onset  . COPD Mother   . Bone cancer Father    Social History  reports that she has never smoked. She has never used smokeless tobacco. She reports that she does not drink alcohol or use drugs. Allergies  Allergies  Allergen Reactions  . Fish Allergy Swelling  . Pepto-Bismol [Bismuth Subsalicylate] Nausea And Vomiting  . Cortisone Itching and Rash    Injections and cream only.  PO ok.  . Trichophyton Itching    Also sneezing accompanying as well   Home medications Prior to Admission medications   Medication Sig Start Date End Date Taking? Authorizing Provider  albuterol (PROVENTIL)  (2.5 MG/3ML) 0.083% nebulizer solution Take 3 mLs (2.5 mg total) by nebulization every 6 (six) hours as needed for wheezing or shortness of breath. 06/04/15  Yes Coralyn HellingVineet Sood, MD  atorvastatin (LIPITOR) 10 MG tablet Take 10 mg by mouth daily.   Yes Historical Provider, MD  budesonide (PULMICORT) 0.25 MG/2ML nebulizer solution Take 0.25 mg by nebulization 2 (two) times daily.    Yes Historical Provider, MD  famotidine (PEPCID) 20 MG tablet Take 1 tablet (20 mg total) by mouth at bedtime. 09/21/15  Yes Albertine GratesFang Xu, MD  FERREX 150 150 MG capsule Take 150 mg by mouth daily. 07/22/15  Yes Historical Provider, MD  losartan-hydrochlorothiazide (HYZAAR) 100-12.5 MG tablet Take 1 tablet by mouth daily.  03/17/16  Yes Historical Provider, MD  metFORMIN (GLUCOPHAGE) 500 MG tablet Take 1,000 mg by mouth every evening.    Yes Historical Provider, MD  montelukast (SINGULAIR) 10 MG tablet Take 10 mg by mouth at bedtime.   Yes Historical Provider, MD  omeprazole (PRILOSEC) 40 MG capsule Take 40 mg by mouth daily.  04/04/16  Yes Historical Provider, MD  sertraline (ZOLOFT) 100 MG tablet Take 100 mg by mouth daily.  03/17/16  Yes Historical Provider, MD  loratadine (CLARITIN) 10 MG tablet Take 10 mg by mouth daily as needed for allergies.     Historical Provider, MD  losartan-hydrochlorothiazide (HYZAAR) 50-12.5 MG tablet Take 1 tablet by mouth daily. Patient not taking: Reported on 05/03/2016 09/21/15   Albertine GratesFang Xu, MD  omeprazole (PRILOSEC) 20 MG capsule Take 40 mg by mouth daily.    Historical Provider, MD  sertraline (ZOLOFT) 50 MG tablet Take 100 mg by mouth daily.     Historical Provider, MD   Liver Function Tests  Recent Labs Lab 05/07/16 0401 05/08/16 0327 05/09/16 0318  ALBUMIN 2.7* 2.7* 2.5*   No results for input(s): LIPASE, AMYLASE in the last 168 hours. CBC  Recent Labs Lab 05/09/16 0318 05/10/16 0945 05/11/16 0510  WBC 3.8* 3.2* 3.2*  NEUTROABS 2.2 1.7 1.3*  HGB 8.0* 7.9* 7.4*  HCT 27.9* 28.1* 26.3*   MCV 80.9 81.9 82.7  PLT 251 281 261   Basic Metabolic Panel  Recent Labs Lab 05/06/16 1447 05/07/16 0401 05/08/16 0327 05/09/16 0318 05/09/16 1548 05/10/16 0557 05/11/16 0510  NA 139 136  137 139 141 139 143 143  K 3.5 3.5  3.5 3.3* 3.2* 3.3* 4.1 4.2  CL 88* 90*  91* 94* 98* 99* 103 105  CO2 40* 36*  38* 37* 35* 32 33* 31  GLUCOSE 187* 197*  201* 145* 132* 160* 146* 145*  BUN 14 12  12 14 16 15 13 17   CREATININE 0.64 0.68  0.71 0.69 1.73* 1.84* 1.84* 1.90*  CALCIUM 8.3* 8.0*  8.1* 8.1* 8.0* 7.8* 8.3* 8.3*  PHOS 2.1* 3.1 5.9*  5.3*  --   --   --    Iron/TIBC/Ferritin/ %Sat    Component Value Date/Time   FERRITIN 7 (L) 07/29/2015 2315    Vitals:   05/10/16 2015 05/10/16 2041 05/10/16 2325 05/11/16 0546  BP: 127/74   119/80  Pulse: 89   79  Resp: 20  18 18   Temp: 99.1 F (37.3 C)   98.2 F (36.8 C)  TempSrc: Oral   Oral  SpO2: 99% 98%  94%  Weight:      Height:       Exam Gen obese WF no distress, lying flat No rash, cyanosis or gangrene Sclera anicteric, throat clear and moist  No jvd or bruits Chest very poor air movement diffusely, no rales/ wheezing RRR no MRG Abd soft ntnd no mass or ascites +bs obese GU defer MS no joint effusions or deformity Ext no LE or UE edema / no wounds or ulcers Neuro is alert, Ox 3 , nf  UNa 28 UCr 38 UA - pending  Assessment: 1. AKI - suspect toxic ATN from combination IV lasix/ nsaids/ IV gent / IV vanc which were all given over the same 72 hr period.  Pt is euvolemic now.  Will add gentle IVF"s, supportive care.   2. HTN - BP's good, not on any bp meds 3. Rectal bleed sp repeat hemorrhoid surgery 11/24 4. Mental retardation 5. Chron resp failure on home O2 (OSA/ asthma/ OHS)    Plan - as above  Vinson Moselle MD [name] [name] pager [phone]   05/11/2016, 9:12 AM

## 2016-05-11 NOTE — Progress Notes (Signed)
PROGRESS NOTE    Shelley [name] [name]  NFA:[id number] DOB: 05/06/1966 DOA: 05/03/2016 PCP: [name],Shelley COLLINS, MD    Brief Narrative:  50 y.o.female patient of Dr. Adria [name]. Known history of OSA/OHS on home BiPAP, Patient  on oxygen chronically at 2 L/m as well as 4 L/m at night while sleeping. Also has a known history of asthma. Patient has a history of nonadherence to noninvasive positive pressure ventilation at home. Patient presented with symptomatic anemia due to bleeding from hemorrhoids. Dr. Michaell [name] performed hemorrhoidectomy on 11/23. Patient required reintubation and postanesthesia care unit and subsequently transferred to the intensive care unit for further treatment and stabilization postoperatively. Shelley [name] [facility]     Assessment & Plan:   Principal Problem:   Acute lower GI bleeding Active Problems:   Cognitive developmental delay   Obstructive sleep apnea   Chronic respiratory failure (HCC)   Iron deficiency anemia due to chronic blood loss   Hypokalemia   Diabetes mellitus (HCC)   Obesity, Class III, BMI 40-49.9 (morbid obesity) (HCC)   Bleeding internal hemorrhoids   Dependence on supplemental oxygen   Anxiety   Mental retardation   History of arteriovenous malformation (AVM)   Gastro-esophageal reflux disease without esophagitis   Acute blood loss anemia   Bleeding gastrointestinal   Obesity hypoventilation syndrome (HCC)   Chronic constipation   AVM (arteriovenous malformation) of colon   AKI (acute kidney injury) (HCC)   Acute on chronic respiratory failure (HCC)   Acute renal failure (ARF) (HCC)  #1 acute lower GI bleed secondary to bleeding internal hemorrhoids Status post hemorrhoidectomy 05/06/2016 per Dr. Michaell [name]. Hemoglobin is 7.4 and stable. Status post 2 units packed red blood cells 05/03/2016 and 05/04/2016. Continue sitz baths. No NSAIDs. General surgery following and appreciate input and  recommendations.  #2 symptomatic anemia Secondary to problem #1. Patient status post 2 units packed red blood cells. H&H stable. Follow. Transfusion threshold hemoglobin less than 7.  #3 acute renal failure/acute kidney injury Questionable etiology. Likely multifactorial secondary to prerenal azotemia, IV diuretics, NSAIDs, IV gent, IV vancomycin. Patient euvolemic. Renal ultrasound with enlarged left kidney without evidence of hydronephrosis. Right kidney within normal limits. Repeat a UA with cultures and sensitivities. Check a urine sodium. Check a urine creatinine. IV fluids. Consult with nephrology for further evaluation and management.  #4 mental retardation   #5 acute on chronic respiratory failure Status post vent dependent respiratory failure postoperatively. Concern for right lower lobe pneumonia. Patient currently on IV Zosyn. Currently on nasal cannula. Transition to oral Augmentin.  #6 asthma exacerbation Resolved. Continue Pulmicort. Continue bronchodilators as needed.  #7 diabetes mellitus Hemoglobin A1c was 6. 03/11/2016. CBGs have ranged from 133-172. Sliding scale insulin.  #8 gastroesophageal reflux disease Continue Pepcid.  #9 obstructive sleep apnea BiPAP daily at bedtime.   DVT prophylaxis: SCDs Code Status: Full Family Communication: Updated patient. No family at bedside. Disposition Plan: Back to group home when medically stable and renal function back to baseline.   Consultants:   Nephrology: Dr.Shertz 05/11/2016  Procedures:   Chest x-ray 05/06/2016, 05/07/2016  Renal ultrasound 05/10/2016    Antimicrobials:   IV gentamicin 05/06/2006 1  IV clindamycin 05/06/2006 1  IV vancomycin 05/06/2016>>>. 05/08/2016  IV Zosyn 05/06/2016>>>> 05/11/2016  Oral Augmentin 05/11/2016   Subjective: Sitting up in chair. No chest end. No shortness of breath. Denies any further bleeding.  Objective: Vitals:   05/10/16 2041 05/10/16 2325 05/11/16  0546 05/11/16 0910  BP:  119/80   Pulse:   79   Resp:  18 18   Temp:   98.2 F (36.8 C)   TempSrc:   Oral   SpO2: 98%  94% 100%  Weight:      Height:        Intake/Output Summary (Last 24 hours) at 05/11/16 1219 Last data filed at 05/11/16 0930  Gross per 24 hour  Intake          1154.17 ml  Output              800 ml  Net           354.17 ml   Filed Weights   05/03/16 1905  Weight: 107 kg (235 lb 14.3 oz)    Examination:  General exam: Appears calm and comfortable  Respiratory system: Clear to auscultation. Respiratory effort normal. Cardiovascular system: S1 & S2 heard, RRR. No JVD, murmurs, rubs, gallops or clicks. No pedal edema. Gastrointestinal system: Abdomen is nondistended, soft and nontender. No organomegaly or masses felt. Normal bowel sounds heard. Central nervous system: Alert and oriented. No focal neurological deficits. Extremities: Symmetric 5 x 5 power. Skin: No rashes, lesions or ulcers Psychiatry: Judgement and insight appear normal. Mood & affect appropriate.     Data Reviewed: I have personally reviewed following labs and imaging studies  CBC:  Recent Labs Lab 05/07/16 0401 05/08/16 0327 05/09/16 0318 05/10/16 0945 05/11/16 0510  WBC 6.0 6.1 3.8* 3.2* 3.2*  NEUTROABS 3.1 2.7 2.2 1.7 1.3*  HGB 8.0* 7.9* 8.0* 7.9* 7.4*  HCT 27.8* 27.1* 27.9* 28.1* 26.3*  MCV 79.7 79.2 80.9 81.9 82.7  PLT 253 232 251 281 261   Basic Metabolic Panel:  Recent Labs Lab 05/06/16 1447 05/07/16 0401 05/08/16 0327 05/09/16 0318 05/09/16 1548 05/10/16 0557 05/11/16 0510  NA 139 136  137 139 141 139 143 143  K 3.5 3.5  3.5 3.3* 3.2* 3.3* 4.1 4.2  CL 88* 90*  91* 94* 98* 99* 103 105  CO2 40* 36*  38* 37* 35* 32 33* 31  GLUCOSE 187* 197*  201* 145* 132* 160* 146* 145*  BUN 14 12  12 14 16 15 13 17   CREATININE 0.64 0.68  0.71 0.69 1.73* 1.84* 1.84* 1.90*  CALCIUM 8.3* 8.0*  8.1* 8.1* 8.0* 7.8* 8.3* 8.3*  MG 1.4* 2.4 1.9 2.1  --  2.1  --   PHOS  2.1* 3.1 5.9* 5.3*  --   --   --    GFR: Estimated Creatinine Clearance: 44.6 mL/min (by C-G formula based on SCr of 1.9 mg/dL (H)). Liver Function Tests:  Recent Labs Lab 05/06/16 1447 05/07/16 0401 05/08/16 0327 05/09/16 0318  ALBUMIN 2.9* 2.7* 2.7* 2.5*   No results for input(s): LIPASE, AMYLASE in the last 168 hours. No results for input(s): AMMONIA in the last 168 hours. Coagulation Profile: No results for input(s): INR, PROTIME in the last 168 hours. Cardiac Enzymes: No results for input(s): CKTOTAL, CKMB, CKMBINDEX, TROPONINI in the last 168 hours. BNP (last 3 results) No results for input(s): PROBNP in the last 8760 hours. HbA1C: No results for input(s): HGBA1C in the last 72 hours. CBG:  Recent Labs Lab 05/10/16 1226 05/10/16 1609 05/10/16 2255 05/11/16 0728 05/11/16 1147  GLUCAP 205* 112* 172* 133* 148*   Lipid Profile: No results for input(s): CHOL, HDL, LDLCALC, TRIG, CHOLHDL, LDLDIRECT in the last 72 hours. Thyroid Function Tests: No results for input(s): TSH, T4TOTAL, FREET4, T3FREE, THYROIDAB in the last 72  hours. Anemia Panel: No results for input(s): VITAMINB12, FOLATE, FERRITIN, TIBC, IRON, RETICCTPCT in the last 72 hours. Sepsis Labs:  Recent Labs Lab 05/06/16 2100 05/07/16 0401 05/08/16 0327  PROCALCITON <0.10 <0.10 <0.10  LATICACIDVEN 1.8  --   --     Recent Results (from the past 240 hour(s))  Surgical pcr screen     Status: None   Collection Time: 05/06/16 12:02 AM  Result Value Ref Range Status   MRSA, PCR NEGATIVE NEGATIVE Final   Staphylococcus aureus NEGATIVE NEGATIVE Final    Comment:        The Xpert SA Assay (FDA approved for NASAL specimens in patients over 24 years of age), is one component of a comprehensive surveillance program.  Test performance has been validated by [facility] for patients greater than or equal to 35 year old. It is not intended to diagnose infection nor to guide or monitor treatment.    Culture, respiratory (NON-Expectorated)     Status: None   Collection Time: 05/06/16  8:47 PM  Result Value Ref Range Status   Specimen Description TRACHEAL ASPIRATE  Final   Special Requests Normal  Final   Gram Stain   Final    ABUNDANT WBC PRESENT, PREDOMINANTLY PMN RARE SQUAMOUS EPITHELIAL CELLS PRESENT RARE BUDDING YEAST SEEN RARE GRAM POSITIVE COCCI IN PAIRS RARE GRAM VARIABLE ROD    Culture   Final    Consistent with normal respiratory flora. Performed at [facility]    Report Status 05/09/2016 FINAL  Final  Culture, blood (Routine X 2) w Reflex to ID Panel     Status: None (Preliminary result)   Collection Time: 05/06/16  9:00 PM  Result Value Ref Range Status   Specimen Description BLOOD LEFT ARM  Final   Special Requests BOTTLES DRAWN AEROBIC AND ANAEROBIC 8 CC  Final   Culture   Final    NO GROWTH 4 DAYS Performed at [facility]    Report Status PENDING  Incomplete  Culture, blood (Routine X 2) w Reflex to ID Panel     Status: None (Preliminary result)   Collection Time: 05/06/16  9:00 PM  Result Value Ref Range Status   Specimen Description BLOOD RIGHT HAND  Final   Special Requests BOTTLES DRAWN AEROBIC AND ANAEROBIC 5 CC  Final   Culture   Final    NO GROWTH 4 DAYS Performed at [facility]    Report Status PENDING  Incomplete  Culture, Urine     Status: None   Collection Time: 05/06/16  9:11 PM  Result Value Ref Range Status   Specimen Description URINE, CLEAN CATCH  Final   Special Requests Normal  Final   Culture NO GROWTH Performed at [facility]   Final   Report Status 05/08/2016 FINAL  Final         Radiology Studies: US Renal  Result Date: 05/10/2016 CLINICAL DATA:  Acute renal failure EXAM: RENAL / URINARY TRACT ULTRASOUND COMPLETE COMPARISON:  02/20/2013 FINDINGS: Right Kidney: Length: 12.0 cm. Echogenicity within normal limits. No mass or hydronephrosis visualized. Left Kidney: Length: 17.3 cm.  Echogenicity within normal limits. No mass or hydronephrosis visualized. There is a minimally complex cyst in the central left kidney. Bladder: Appears normal for degree of bladder distention. IMPRESSION: The left kidney has become enlarged without evidence of hydronephrosis. This is of unknown significance. Differential diagnosis includes an acute process such as acute tibial necrosis or a chronic process such as infiltration with  tumor. Consider further imaging to characterize. The right kidney is within normal limits. Electronically Signed   By: Jolaine ClickArthur  Hoss M.D.   On: 05/10/2016 15:34        Scheduled Meds: . atorvastatin  10 mg Oral q1800  . budesonide  0.25 mg Nebulization BID  . famotidine  20 mg Oral Daily  . hydrocortisone-pramoxine  1 application Rectal QID  . insulin aspart  0-20 Units Subcutaneous TID WC  . insulin aspart  0-5 Units Subcutaneous QHS  . ipratropium-albuterol  3 mL Nebulization Q6H WA  . lip balm  1 application Topical BID  . montelukast  10 mg Oral QHS  . nystatin   Topical TID  . piperacillin-tazobactam (ZOSYN)  IV  3.375 g Intravenous Q8H  . sertraline  100 mg Oral Daily  . vitamin C  500 mg Oral BID   Continuous Infusions: . sodium chloride 50 mL/hr at 05/09/16 1700     LOS: 8 days    Time spent: 35 minutes    Relena Ivancic, MD [facility] Pager [phone]  If 7PM-7AM, please contact night-coverage [email or web address] Password [email or web address] 05/11/2016, 12:19 PM

## 2016-05-11 NOTE — Progress Notes (Signed)
Patient ID: Shelley [name], female   DOB: 08/23/1965, 50 y.o.   MRN: [id number]  [facility]  5 Days Post-Op  Subjective: Persistent pain at surgical sight, but it is no worse than before. Tolerating sitz baths. Had BM yesterday that was nonbloody.  Objective: Vital signs in last 24 hours: Temp:  [98.2 F (36.8 C)-99.1 F (37.3 C)] 98.2 F (36.8 C) (11/29 0546) Pulse Rate:  [79-89] 79 (11/29 0546) Resp:  [18-20] 18 (11/29 0546) BP: (119-135)/(73-80) 119/80 (11/29 0546) SpO2:  [94 %-100 %] 100 % (11/29 0910) Last BM Date: 05/09/16  Intake/Output from previous day: 11/28 0701 - 11/29 0700 In: 1034.2 [P.O.:480; I.V.:454.2; IV Piggyback:100] Out: 552 [Urine:550; Stool:2] Intake/Output this shift: Total I/O In: -  Out: 450 [Urine:450]  PE: Gen:  Alert, NAD, pleasant Pulm: effort normal Abd: Soft, NT/ND, area under panus with power in place and no erythema Rectum: no active bleeding   Lab Results:   Recent Labs  05/10/16 0945 05/11/16 0510  WBC 3.2* 3.2*  HGB 7.9* 7.4*  HCT 28.1* 26.3*  PLT 281 261   BMET  Recent Labs  05/10/16 0557 05/11/16 0510  NA 143 143  K 4.1 4.2  CL 103 105  CO2 33* 31  GLUCOSE 146* 145*  BUN 13 17  CREATININE 1.84* 1.90*  CALCIUM 8.3* 8.3*   PT/INR No results for input(s): LABPROT, INR in the last 72 hours. CMP     Component Value Date/Time   NA 143 05/11/2016 0510   K 4.2 05/11/2016 0510   CL 105 05/11/2016 0510   CO2 31 05/11/2016 0510   GLUCOSE 145 (H) 05/11/2016 0510   BUN 17 05/11/2016 0510   CREATININE 1.90 (H) 05/11/2016 0510   CALCIUM 8.3 (L) 05/11/2016 0510   PROT 7.7 05/03/2016 1535   ALBUMIN 2.5 (L) 05/09/2016 0318   AST 16 05/03/2016 1535   ALT 11 (L) 05/03/2016 1535   ALKPHOS 63 05/03/2016 1535   BILITOT 0.4 05/03/2016 1535   GFRNONAA 30 (L) 05/11/2016 0510   GFRAA 34 (L) 05/11/2016 0510   Lipase  No results found for: LIPASE     Studies/Results: [place]  Result Date:  05/10/2016 CLINICAL DATA:  Acute renal failure EXAM: RENAL / URINARY TRACT ULTRASOUND COMPLETE COMPARISON:  02/20/2013 FINDINGS: Right Kidney: Length: 12.0 cm. Echogenicity within normal limits. No mass or hydronephrosis visualized. Left Kidney: Length: 17.3 cm. Echogenicity within normal limits. No mass or hydronephrosis visualized. There is a minimally complex cyst in the central left kidney. Bladder: Appears normal for degree of bladder distention. IMPRESSION: The left kidney has become enlarged without evidence of hydronephrosis. This is of unknown significance. Differential diagnosis includes an acute process such as acute tibial necrosis or a chronic process such as infiltration with tumor. Consider further imaging to characterize. The right kidney is within normal limits. Electronically Signed   By: Jolaine ClickArthur  Hoss M.D.   On: 05/10/2016 15:34    Anti-infectives: Anti-infectives    Start     Dose/Rate Route Frequency Ordered Stop   05/07/16 0600  vancomycin (VANCOCIN) IVPB 1000 mg/200 mL premix  Status:  Discontinued     1,000 mg 200 mL/hr over 60 Minutes Intravenous Every 8 hours 05/06/16 2049 05/08/16 0828   05/06/16 2030  piperacillin-tazobactam (ZOSYN) IVPB 3.375 g     3.375 g 12.5 mL/hr over 240 Minutes Intravenous Every 8 hours 05/06/16 2017     05/06/16 2030  vancomycin (VANCOCIN) 2,000 mg in sodium chloride 0.9 %  500 mL IVPB     2,000 mg 250 mL/hr over 120 Minutes Intravenous  Once 05/06/16 2018 05/06/16 2258   05/06/16 0600  clindamycin (CLEOCIN) IVPB 900 mg     900 mg 100 mL/hr over 30 Minutes Intravenous On call to O.R. 05/05/16 1644 05/06/16 0756   05/06/16 0600  gentamicin (GARAMYCIN) 540 mg in dextrose 5 % 100 mL IVPB     5 mg/kg  107 kg 227 mL/hr over 30 Minutes Intravenous On call to O.R. 05/05/16 1644 05/06/16 0755       Assessment/Plan Bleeding internal hemorrhoids with symptomatic anemia S/p EXAM UNDER ANESTHESIA, HEMORRHOIDECTOMY,HEMORRHOID LIGATION, 05/06/16, Dr.  Viviann SpareSteven GRoss POD 5 Acute on Chronic Hypoxic Respiratory Failure - Post-op with reintubation in PACU. Acute Respiratory Alkalosis - Likely due to over-ventilation. Suspected Right Lower Lung Aspiration Pneumonia H/O Asthma Acute kidney injury - creatinine is up to 1.9 this Am, medicine evaluating GERD/Hiatal hernia/Hx of AVM Anemia Rash - Likely candida in fold of pannus (Body mass index is 36.95) - improving AODM  FEN: carb modified ID: Vancomycin d/ced 05/07/16 after 2 days/Zosyn 05/06/16 =>> day 6 DVT: SCD, heparin  Plan: hg stable 7.4 today, continue to monitor. Cr workup per medicine.  Tolerating regular diet. Nonbloody BM yesterday. Continue sitz bath. Postop instructions in AVS. No NSAIDS with bleeding issue. Recommend OP follow-up with Dr. [name] in 2-3 weeks. General surgery will sign off, please call with any concerns.    LOS: 8 days    Edson SnowballBROOKE A MILLER , [facility] 05/11/2016, 9:17 AM Pager: [phone] Consults: [phone] Mon-Fri 7:00 am-4:30 pm Sat-Sun 7:00 am-11:30 am

## 2016-05-11 NOTE — Progress Notes (Signed)
Physical Therapy Treatment Patient Details Name: Shelley [name] MRN: [id number] DOB: 09/08/1965 Today's Date: 05/11/2016    History of Present Illness Patient is a 50 yo female who lives in a Group Home setting admitted 05/03/16 with rectal bleed.  s/p hemorrhoidectomy 05/05/16, pt with post op VDRF, extubated11/26/17.   PMH:  home O2, mental retardation, anxiety, arthritis, DM, GIB, HTN    PT Comments    Pt in bed on 2 lts nasal 94%.  C/O rectum pain.  RN notified.  Assisted OOB with increased time and amb in hallway.     Follow Up Recommendations  Home health PT;Supervision for mobility/OOB (back to Group Home)     Equipment Recommendations  None recommended by PT    Recommendations for Other Services       Precautions / Restrictions Precautions Precaution Comments: 2 falls in past 1 year/Home O2 Restrictions Weight Bearing Restrictions: No    Mobility  Bed Mobility Overal bed mobility: Needs Assistance Bed Mobility: Supine to Sit     Supine to sit: Mod assist     General bed mobility comments: increased time with heavy use of rails and side rolling due to recent rectum surgery(pain)   Transfers Overall transfer level: Needs assistance Equipment used: Rolling walker (2 wheeled) Transfers: Sit to/from Stand Sit to Stand: Min guard         General transfer comment: heavy use of BUEs to push up  Ambulation/Gait Ambulation/Gait assistance: Min guard Ambulation Distance (Feet): 42 Feet Assistive device: Rolling walker (2 wheeled)   Gait velocity: decreased   General Gait Details: x 4 standing rest breaks due to dyspnea.  Pt required 4 lts O2 to achieve sats >90.  pt stated she usually needs 3 lts.     Stairs            Wheelchair Mobility    Modified Rankin (Stroke Patients Only)       Balance                                    Cognition Arousal/Alertness: Awake/alert Behavior During Therapy: WFL for tasks  assessed/performed Overall Cognitive Status: Within Functional Limits for tasks assessed (Hx MR very knowledgable about her curtrent condition)                      Exercises      General Comments        Pertinent Vitals/Pain Pain Assessment: Faces Faces Pain Scale: Hurts even more Pain Location: rectum Pain Descriptors / Indicators: Discomfort;Grimacing Pain Intervention(s): Monitored during session;RN gave pain meds during session    Home Living                      Prior Function            PT Goals (current goals can now be found in the care plan section) Progress towards PT goals: Progressing toward goals    Frequency    Min 3X/week      PT Plan Current plan remains appropriate    Co-evaluation             End of Session Equipment Utilized During Treatment: Gait belt;Oxygen Activity Tolerance: Patient tolerated treatment well;No increased pain Patient left: in chair;with call bell/phone within reach     Time: 1141-1210 PT Time Calculation (min) (ACUTE ONLY): 29 min  Charges:  $Gait Training: 8-22  mins $Therapeutic Activity: 8-22 mins                    G Codes:      Felecia ShellingLori Aziza Stuckert  PTA [facility]  Acute  Rehab V  .; ',Pager      [phone]

## 2016-05-12 DIAGNOSIS — K5909 Other constipation: Secondary | ICD-10-CM

## 2016-05-12 LAB — GLUCOSE, CAPILLARY
GLUCOSE-CAPILLARY: 122 mg/dL — AB (ref 65–99)
Glucose-Capillary: 127 mg/dL — ABNORMAL HIGH (ref 65–99)
Glucose-Capillary: 110 mg/dL — ABNORMAL HIGH (ref 65–99)
Glucose-Capillary: 157 mg/dL — ABNORMAL HIGH (ref 65–99)

## 2016-05-12 LAB — CBC WITH DIFFERENTIAL/PLATELET
BASOS PCT: 0 %
Basophils Absolute: 0 10*3/uL (ref 0.0–0.1)
EOS PCT: 1 %
Eosinophils Absolute: 0 10*3/uL (ref 0.0–0.7)
HCT: 28 % — ABNORMAL LOW (ref 36.0–46.0)
HEMOGLOBIN: 7.8 g/dL — AB (ref 12.0–15.0)
LYMPHS PCT: 34 %
Lymphs Abs: 1.1 10*3/uL (ref 0.7–4.0)
MCH: 22.9 pg — AB (ref 26.0–34.0)
MCHC: 27.9 g/dL — ABNORMAL LOW (ref 30.0–36.0)
MCV: 82.4 fL (ref 78.0–100.0)
MONO ABS: 0.3 10*3/uL (ref 0.1–1.0)
Monocytes Relative: 11 %
NEUTROS ABS: 1.7 10*3/uL (ref 1.7–7.7)
NEUTROS PCT: 54 %
Platelets: 308 10*3/uL (ref 150–400)
RBC: 3.4 MIL/uL — ABNORMAL LOW (ref 3.87–5.11)
RDW: 22.1 % — ABNORMAL HIGH (ref 11.5–15.5)
WBC: 3.1 10*3/uL — ABNORMAL LOW (ref 4.0–10.5)

## 2016-05-12 LAB — BASIC METABOLIC PANEL
ANION GAP: 5 (ref 5–15)
BUN: 17 mg/dL (ref 6–20)
CALCIUM: 8.6 mg/dL — AB (ref 8.9–10.3)
CO2: 32 mmol/L (ref 22–32)
Chloride: 106 mmol/L (ref 101–111)
Creatinine, Ser: 1.66 mg/dL — ABNORMAL HIGH (ref 0.44–1.00)
GFR calc Af Amer: 41 mL/min — ABNORMAL LOW (ref >60)
GFR calc non Af Amer: 35 mL/min — ABNORMAL LOW (ref >60)
GLUCOSE: 155 mg/dL — AB (ref 65–99)
Potassium: 4.2 mmol/L (ref 3.5–5.1)
Sodium: 143 mmol/L (ref 135–145)

## 2016-05-12 LAB — URINE CULTURE: CULTURE: NO GROWTH

## 2016-05-12 MED ORDER — IPRATROPIUM-ALBUTEROL 0.5-2.5 (3) MG/3ML IN SOLN
3.0000 mL | Freq: Three times a day (TID) | RESPIRATORY_TRACT | Status: DC
  Administered 2016-05-13 – 2016-05-14 (×5): 3 mL via RESPIRATORY_TRACT
  Filled 2016-05-12 (×5): qty 3

## 2016-05-12 MED ORDER — AMOXICILLIN-POT CLAVULANATE 875-125 MG PO TABS
1.0000 | ORAL_TABLET | Freq: Two times a day (BID) | ORAL | Status: DC
  Administered 2016-05-12 – 2016-05-14 (×5): 1 via ORAL
  Filled 2016-05-12 (×5): qty 1

## 2016-05-12 MED ORDER — FAMOTIDINE 20 MG PO TABS
20.0000 mg | ORAL_TABLET | Freq: Two times a day (BID) | ORAL | Status: DC
  Administered 2016-05-12 – 2016-05-14 (×4): 20 mg via ORAL
  Filled 2016-05-12 (×4): qty 1

## 2016-05-12 NOTE — Progress Notes (Signed)
PROGRESS NOTE    Shelley [name]  ZOX:[id number] DOB: Dec 17, 1965 DOA: 05/03/2016 PCP: Astrid Divine, MD    Brief Narrative:  50 y.o.female patient of Dr. Adria Devon. Known history of OSA/OHS on home BiPAP, Patient  on oxygen chronically at 2 L/m as well as 4 L/m at night while sleeping. Also has a known history of asthma. Patient has a history of nonadherence to noninvasive positive pressure ventilation at home. Patient presented with symptomatic anemia due to bleeding from hemorrhoids. Dr. Michaell Cowing performed hemorrhoidectomy on 11/23. Patient required reintubation and postanesthesia care unit and subsequently transferred to the intensive care unit for further treatment and stabilization postoperatively. She was subsequently transferred to telemetry to [facility] service.     Assessment & Plan:   Principal Problem:   Acute lower GI bleeding Active Problems:   Cognitive developmental delay   Obstructive sleep apnea   Chronic respiratory failure (HCC)   Iron deficiency anemia due to chronic blood loss   Hypokalemia   Diabetes mellitus (HCC)   Obesity, Class III, BMI 40-49.9 (morbid obesity) (HCC)   Bleeding internal hemorrhoids   Dependence on supplemental oxygen   Anxiety   Mental retardation   History of arteriovenous malformation (AVM)   Gastro-esophageal reflux disease without esophagitis   Acute blood loss anemia   Bleeding gastrointestinal   Obesity hypoventilation syndrome (HCC)   Chronic constipation   AVM (arteriovenous malformation) of colon   AKI (acute kidney injury) (HCC)   Acute on chronic respiratory failure (HCC)   Acute renal failure (ARF) (HCC)  #1 acute lower GI bleed secondary to bleeding internal hemorrhoids Status post hemorrhoidectomy 05/06/2016 per Dr. Michaell Cowing. Hemoglobin is 7.8 and stable. Status post 2 units packed red blood cells 05/03/2016 and 05/04/2016. Continue sitz baths. No NSAIDs. General surgery following and appreciate input and  recommendations.  #2 symptomatic anemia Secondary to problem #1. Patient status post 2 units packed red blood cells. H&H stable. Follow. Transfusion threshold hemoglobin less than 7.  #3 acute renal failure/acute kidney injury Likely secondary to ATN, multifactorial secondary to prerenal azotemia, IV diuretics, NSAIDs, IV gent, IV vancomycin. Patient euvolemic. Renal ultrasound with enlarged left kidney without evidence of hydronephrosis. Right kidney within normal limits. Renal function improving daily. Continue IV fluids for another 24-48 hours per nephrology. Follow.   #4 mental retardation   #5 acute on chronic respiratory failure Status post vent dependent respiratory failure postoperatively. Concern for right lower lobe pneumonia. Transition from IV Zosyn to oral Augmentin.  #6 asthma exacerbation Resolved. Continue Pulmicort. Continue bronchodilators as needed.  #7 diabetes mellitus Hemoglobin A1c was 6. 03/11/2016. CBGs have ranged from 110-177. Sliding scale insulin.  #8 gastroesophageal reflux disease Continue Pepcid.  #9 obstructive sleep apnea BiPAP daily at bedtime.   DVT prophylaxis: SCDs Code Status: Full Family Communication: Updated patient. No family at bedside. Disposition Plan: Back to group home when medically stable and renal function back to baseline.   Consultants:   Nephrology: Dr.Shertz 05/11/2016  Procedures:   Chest x-ray 05/06/2016, 05/07/2016  Renal ultrasound 05/10/2016    Antimicrobials:   IV gentamicin 05/06/2006 1  IV clindamycin 05/06/2006 1  IV vancomycin 05/06/2016>>>. 05/08/2016  IV Zosyn 05/06/2016>>>> 05/11/2016  Oral Augmentin 05/12/2016   Subjective: Patient laying in bed. No chest pain. No shortness of breath. Denies any further bleeding.  Objective: Vitals:   05/11/16 2123 05/12/16 0549 05/12/16 0800 05/12/16 0836  BP:  (!) 151/88    Pulse:  87    Resp:  18  Temp:  98.3 F (36.8 C)    TempSrc:  Oral     SpO2: 96% 100%  99%  Weight:   111.4 kg (245 lb 9.6 oz)   Height:        Intake/Output Summary (Last 24 hours) at 05/12/16 1059 Last data filed at 05/12/16 1022  Gross per 24 hour  Intake           2257.5 ml  Output              800 ml  Net           1457.5 ml   Filed Weights   05/03/16 1905 05/12/16 0800  Weight: 107 kg (235 lb 14.3 oz) 111.4 kg (245 lb 9.6 oz)    Examination:  General exam: Appears calm and comfortable  Respiratory system: Clear to auscultation. Respiratory effort normal. Cardiovascular system: S1 & S2 heard, RRR. No JVD, murmurs, rubs, gallops or clicks. No pedal edema. Gastrointestinal system: Abdomen is nondistended, soft and nontender. No organomegaly or masses felt. Normal bowel sounds heard. Central nervous system: Alert and oriented. No focal neurological deficits. Extremities: Symmetric 5 x 5 power. Skin: No rashes, lesions or ulcers Psychiatry: Judgement and insight appear normal. Mood & affect appropriate.     Data Reviewed: I have personally reviewed following labs and imaging studies  CBC:  Recent Labs Lab 05/08/16 0327 05/09/16 0318 05/10/16 0945 05/11/16 0510 05/12/16 0553  WBC 6.1 3.8* 3.2* 3.2* 3.1*  NEUTROABS 2.7 2.2 1.7 1.3* 1.7  HGB 7.9* 8.0* 7.9* 7.4* 7.8*  HCT 27.1* 27.9* 28.1* 26.3* 28.0*  MCV 79.2 80.9 81.9 82.7 82.4  PLT 232 251 281 261 308   Basic Metabolic Panel:  Recent Labs Lab 05/06/16 1447 05/07/16 0401 05/08/16 0327 05/09/16 0318 05/09/16 1548 05/10/16 0557 05/11/16 0510 05/11/16 1625 05/12/16 0553  NA 139 136  137 139 141 139 143 143 141 143  K 3.5 3.5  3.5 3.3* 3.2* 3.3* 4.1 4.2 3.7 4.2  CL 88* 90*  91* 94* 98* 99* 103 105 104 106  CO2 40* 36*  38* 37* 35* 32 33* 31 31 32  GLUCOSE 187* 197*  201* 145* 132* 160* 146* 145* 126* 155*  BUN 14 12  12 14 16 15 13 17 18 17   CREATININE 0.64 0.68  0.71 0.69 1.73* 1.84* 1.84* 1.90* 1.81* 1.66*  CALCIUM 8.3* 8.0*  8.1* 8.1* 8.0* 7.8* 8.3* 8.3* 8.4*  8.6*  MG 1.4* 2.4 1.9 2.1  --  2.1  --   --   --   PHOS 2.1* 3.1 5.9* 5.3*  --   --   --   --   --    GFR: Estimated Creatinine Clearance: 52.2 mL/min (by C-G formula based on SCr of 1.66 mg/dL (H)). Liver Function Tests:  Recent Labs Lab 05/06/16 1447 05/07/16 0401 05/08/16 0327 05/09/16 0318  ALBUMIN 2.9* 2.7* 2.7* 2.5*   No results for input(s): LIPASE, AMYLASE in the last 168 hours. No results for input(s): AMMONIA in the last 168 hours. Coagulation Profile: No results for input(s): INR, PROTIME in the last 168 hours. Cardiac Enzymes: No results for input(s): CKTOTAL, CKMB, CKMBINDEX, TROPONINI in the last 168 hours. BNP (last 3 results) No results for input(s): PROBNP in the last 8760 hours. HbA1C: No results for input(s): HGBA1C in the last 72 hours. CBG:  Recent Labs Lab 05/11/16 0728 05/11/16 1147 05/11/16 1712 05/11/16 2148 05/12/16 0732  GLUCAP 133* 148* 118* 177* 127*   Lipid  Profile: No results for input(s): CHOL, HDL, LDLCALC, TRIG, CHOLHDL, LDLDIRECT in the last 72 hours. Thyroid Function Tests: No results for input(s): TSH, T4TOTAL, FREET4, T3FREE, THYROIDAB in the last 72 hours. Anemia Panel: No results for input(s): VITAMINB12, FOLATE, FERRITIN, TIBC, IRON, RETICCTPCT in the last 72 hours. Sepsis Labs:  Recent Labs Lab 05/06/16 2100 05/07/16 0401 05/08/16 0327  PROCALCITON <0.10 <0.10 <0.10  LATICACIDVEN 1.8  --   --     Recent Results (from the past 240 hour(s))  Surgical pcr screen     Status: None   Collection Time: 05/06/16 12:02 AM  Result Value Ref Range Status   MRSA, PCR NEGATIVE NEGATIVE Final   Staphylococcus aureus NEGATIVE NEGATIVE Final    Comment:        The Xpert SA Assay (FDA approved for NASAL specimens in patients over 72 years of age), is one component of a comprehensive surveillance program.  Test performance has been validated by [facility] for patients greater than or equal to 29 year old. It is not  intended to diagnose infection nor to guide or monitor treatment.   Culture, respiratory (NON-Expectorated)     Status: None   Collection Time: 05/06/16  8:47 PM  Result Value Ref Range Status   Specimen Description TRACHEAL ASPIRATE  Final   Special Requests Normal  Final   Gram Stain   Final    ABUNDANT WBC PRESENT, PREDOMINANTLY PMN RARE SQUAMOUS EPITHELIAL CELLS PRESENT RARE BUDDING YEAST SEEN RARE GRAM POSITIVE COCCI IN PAIRS RARE GRAM VARIABLE ROD    Culture   Final    Consistent with normal respiratory flora. Performed at [facility]    Report Status 05/09/2016 FINAL  Final  Culture, blood (Routine X 2) w Reflex to ID Panel     Status: None   Collection Time: 05/06/16  9:00 PM  Result Value Ref Range Status   Specimen Description BLOOD LEFT ARM  Final   Special Requests BOTTLES DRAWN AEROBIC AND ANAEROBIC 8 CC  Final   Culture   Final    NO GROWTH 5 DAYS Performed at [facility]    Report Status 05/11/2016 FINAL  Final  Culture, blood (Routine X 2) w Reflex to ID Panel     Status: None   Collection Time: 05/06/16  9:00 PM  Result Value Ref Range Status   Specimen Description BLOOD RIGHT HAND  Final   Special Requests BOTTLES DRAWN AEROBIC AND ANAEROBIC 5 CC  Final   Culture   Final    NO GROWTH 5 DAYS Performed at [facility]    Report Status 05/11/2016 FINAL  Final  Culture, Urine     Status: None   Collection Time: 05/06/16  9:11 PM  Result Value Ref Range Status   Specimen Description URINE, CLEAN CATCH  Final   Special Requests Normal  Final   Culture NO GROWTH Performed at [facility]   Final   Report Status 05/08/2016 FINAL  Final  Culture, Urine     Status: None   Collection Time: 05/11/16 10:03 AM  Result Value Ref Range Status   Specimen Description URINE, CLEAN CATCH  Final   Special Requests NONE  Final   Culture NO GROWTH Performed at [facility]   Final   Report Status 05/12/2016 FINAL  Final          Radiology Studies: US Renal  Result Date: 05/10/2016 CLINICAL DATA:  Acute renal failure EXAM: RENAL / URINARY  TRACT ULTRASOUND COMPLETE COMPARISON:  02/20/2013 FINDINGS: Right Kidney: Length: 12.0 cm. Echogenicity within normal limits. No mass or hydronephrosis visualized. Left Kidney: Length: 17.3 cm. Echogenicity within normal limits. No mass or hydronephrosis visualized. There is a minimally complex cyst in the central left kidney. Bladder: Appears normal for degree of bladder distention. IMPRESSION: The left kidney has become enlarged without evidence of hydronephrosis. This is of unknown significance. Differential diagnosis includes an acute process such as acute tibial necrosis or a chronic process such as infiltration with tumor. Consider further imaging to characterize. The right kidney is within normal limits. Electronically Signed   By: Jolaine ClickArthur  Hoss M.D.   On: 05/10/2016 15:34        Scheduled Meds: . atorvastatin  10 mg Oral q1800  . budesonide  0.25 mg Nebulization BID  . famotidine  20 mg Oral Daily  . hydrocortisone-pramoxine  1 application Rectal QID  . insulin aspart  0-20 Units Subcutaneous TID WC  . insulin aspart  0-5 Units Subcutaneous QHS  . ipratropium-albuterol  3 mL Nebulization Q6H WA  . lip balm  1 application Topical BID  . montelukast  10 mg Oral QHS  . nystatin   Topical TID  . piperacillin-tazobactam (ZOSYN)  IV  3.375 g Intravenous Q8H  . sertraline  100 mg Oral Daily  . vitamin C  500 mg Oral BID   Continuous Infusions: . sodium chloride 50 mL/hr at 05/09/16 1700     LOS: 9 days    Time spent: 35 minutes    Reece Mcbroom, MD [facility] Pager [phone]  If 7PM-7AM, please contact night-coverage [email or web address] Password [email or web address] 05/12/2016, 10:59 AM

## 2016-05-12 NOTE — Progress Notes (Signed)
[name] Progress Note   Subjective: good UOP, creat down 1.6 today, no c/o  Vitals:   05/11/16 1624 05/11/16 1900 05/11/16 2123 05/12/16 0549  BP: 119/81 137/74  (!) 151/88  Pulse: 76 86  87  Resp: 18 18  18   Temp: 99.3 F (37.4 C) 98.1 F (36.7 C)  98.3 F (36.8 C)  TempSrc: Oral Oral  Oral  SpO2: 98% 94% 96% 100%  Weight:      Height:        Inpatient medications: . atorvastatin  10 mg Oral q1800  . budesonide  0.25 mg Nebulization BID  . famotidine  20 mg Oral Daily  . hydrocortisone-pramoxine  1 application Rectal QID  . insulin aspart  0-20 Units Subcutaneous TID WC  . insulin aspart  0-5 Units Subcutaneous QHS  . ipratropium-albuterol  3 mL Nebulization Q6H WA  . lip balm  1 application Topical BID  . montelukast  10 mg Oral QHS  . nystatin   Topical TID  . piperacillin-tazobactam (ZOSYN)  IV  3.375 g Intravenous Q8H  . sertraline  100 mg Oral Daily  . vitamin C  500 mg Oral BID   . sodium chloride 50 mL/hr at 05/09/16 1700   fentaNYL (SUBLIMAZE) injection, hydrocortisone-pramoxine, magic mouthwash, oxyCODONE, witch hazel-glycerin  Exam: Gen obese WF no distress, lying flat No rash, cyanosis or gangrene Sclera anicteric, throat clear and moist  No jvd or bruits Chest very poor air movement diffusely, no rales/ wheezing RRR no MRG Abd soft ntnd no mass or ascites +bs obese GU defer MS no joint effusions or deformity Ext no LE or UE edema / no wounds or ulcers Neuro is alert, Ox 3 , nf   Clinda 900 mg and gent 5mg /kg x 1 dose on 05/06/16 peri-op Lasix 20 mg IV x 3 over first 5d inpt Ibuprofen IV 600 mg  x 1 11/25 Vanc IV 6 gm over 48 hrs 11/24-25  UNa 28 UCr 38 UA - 6-30 wbc, 0-5 rbc, 6.0  Assessment: 1. AKI - due to ATN from combination IV lasix/ nsaids/ IV gent / IV vanc which were all given over the same 72 hr period.  Creat down today, except continued improvement.  Cont IVF another 1-2 days. Avoid nephrotoxic agents.  No  need for renal f/u at this point.  Will sign off.   2. HTN - BP's good, not on any bp meds 3. Rectal bleed sp repeat hemorrhoid surgery 11/24 4. Mental retardation 5. Chron resp failure on home O2 (OSA/ asthma/ OHS)  Plan - as above   Shelley Moselleob Averleigh Savary MD [facility] pager [phone]   05/12/2016, 7:23 AM    Recent Labs Lab 05/07/16 0401 05/08/16 0327 05/09/16 0318  05/11/16 0510 05/11/16 1625 05/12/16 0553  NA 136  137 139 141  < > 143 141 143  K 3.5  3.5 3.3* 3.2*  < > 4.2 3.7 4.2  CL 90*  91* 94* 98*  < > 105 104 106  CO2 36*  38* 37* 35*  < > 31 31 32  GLUCOSE 197*  201* 145* 132*  < > 145* 126* 155*  BUN 12  12 14 16   < > 17 18 17   CREATININE 0.68  0.71 0.69 1.73*  < > 1.90* 1.81* 1.66*  CALCIUM 8.0*  8.1* 8.1* 8.0*  < > 8.3* 8.4* 8.6*  PHOS 3.1 5.9* 5.3*  --   --   --   --   < > =  values in this interval not displayed.  Recent Labs Lab 05/07/16 0401 05/08/16 0327 05/09/16 0318  ALBUMIN 2.7* 2.7* 2.5*    Recent Labs Lab 05/09/16 0318 05/10/16 0945 05/11/16 0510  WBC 3.8* 3.2* 3.2*  NEUTROABS 2.2 1.7 1.3*  HGB 8.0* 7.9* 7.4*  HCT 27.9* 28.1* 26.3*  MCV 80.9 81.9 82.7  PLT 251 281 261   Iron/TIBC/Ferritin/ %Sat    Component Value Date/Time   FERRITIN 7 (L) 07/29/2015 2315

## 2016-05-12 NOTE — Progress Notes (Signed)
Pt refused to wear nocturnal bipap tonight.  Pt was encouraged to call should she change her mind.  RN aware.

## 2016-05-12 NOTE — Progress Notes (Signed)
Pharmacy Antibiotic Note  Shelley [name] is a 50 y.o. female admitted on 05/03/2016 with bleeding hemorrhoids s/p hemorrhoidectomy on 11/24. Patient currently on Zosyn day #6 for suspected aspiration PNA.  Afebrile.  WBC borderline low. Azotemia improving - nephrology recommended continue IVF for 1-2 more days, avoid nephrotoxins. Blood, sputum, urine cultures negative  Plan:  Continue Zosyn 3.375g IV Q8H infused over 4hrs  Need to establish planned duration of therapy - suggest considering 7 days  ______________________________   Height: 5\' 7"  (170.2 cm) Weight: 245 lb 9.6 oz (111.4 kg) IBW/kg (Calculated) : 61.6  Temp (24hrs), Avg:98.6 F (37 C), Min:98.1 F (36.7 C), Max:99.3 F (37.4 C)   Recent Labs Lab 05/06/16 2100  05/08/16 0327 05/09/16 0318 05/09/16 1548 05/10/16 0557 05/10/16 0945 05/11/16 0510 05/11/16 0841 05/11/16 1625 05/12/16 0553  WBC 5.1  < > 6.1 3.8*  --   --  3.2* 3.2*  --   --  3.1*  CREATININE  --   < > 0.69 1.73* 1.84* 1.84*  --  1.90*  --  1.81* 1.66*  LATICACIDVEN 1.8  --   --   --   --   --   --   --   --   --   --   VANCOTROUGH  --   --   --   --   --   --   --   --  7*  --   --   < > = values in this interval not displayed.  Estimated Creatinine Clearance: 52.2 mL/min (by C-G formula based on SCr of 1.66 mg/dL (H)).    Allergies  Allergen Reactions  . Fish Allergy Swelling  . Pepto-Bismol [Bismuth Subsalicylate] Nausea And Vomiting  . Cortisone Itching and Rash    Injections and cream only.  PO ok.  . Trichophyton Itching    Also sneezing accompanying as well   Antimicrobials this admission:  11/24 Zosyn >>  11/24 Vancomycin >> 11/26  Dose adjustments this admission:  n/a  Microbiology results:  11/24 BCx: NG-F 11/24 UCx: NG-F 11/24 Sputum: normal flora - F 11/24 MRSA PCR, surgical screen: negative   Thank you for allowing pharmacy to be a part of this patient's care.   Elie Goodyandy Adreona Brand, PharmD, BCPS Pager:  [phone] 05/12/2016  10:51 AM

## 2016-05-13 DIAGNOSIS — F79 Unspecified intellectual disabilities: Secondary | ICD-10-CM

## 2016-05-13 DIAGNOSIS — F419 Anxiety disorder, unspecified: Secondary | ICD-10-CM

## 2016-05-13 LAB — CBC
HCT: 26.3 % — ABNORMAL LOW (ref 36.0–46.0)
HEMOGLOBIN: 7.6 g/dL — AB (ref 12.0–15.0)
MCH: 23.7 pg — AB (ref 26.0–34.0)
MCHC: 28.9 g/dL — AB (ref 30.0–36.0)
MCV: 81.9 fL (ref 78.0–100.0)
Platelets: 265 10*3/uL (ref 150–400)
RBC: 3.21 MIL/uL — AB (ref 3.87–5.11)
RDW: 21.9 % — ABNORMAL HIGH (ref 11.5–15.5)
WBC: 3.9 10*3/uL — ABNORMAL LOW (ref 4.0–10.5)

## 2016-05-13 LAB — BASIC METABOLIC PANEL
Anion gap: 7 (ref 5–15)
BUN: 17 mg/dL (ref 6–20)
CHLORIDE: 105 mmol/L (ref 101–111)
CO2: 32 mmol/L (ref 22–32)
Calcium: 8.5 mg/dL — ABNORMAL LOW (ref 8.9–10.3)
Creatinine, Ser: 1.53 mg/dL — ABNORMAL HIGH (ref 0.44–1.00)
GFR calc Af Amer: 45 mL/min — ABNORMAL LOW (ref >60)
GFR calc non Af Amer: 39 mL/min — ABNORMAL LOW (ref >60)
GLUCOSE: 148 mg/dL — AB (ref 65–99)
POTASSIUM: 4.4 mmol/L (ref 3.5–5.1)
Sodium: 144 mmol/L (ref 135–145)

## 2016-05-13 LAB — GLUCOSE, CAPILLARY
GLUCOSE-CAPILLARY: 158 mg/dL — AB (ref 65–99)
GLUCOSE-CAPILLARY: 89 mg/dL (ref 65–99)
GLUCOSE-CAPILLARY: 159 mg/dL — AB (ref 65–99)
Glucose-Capillary: 136 mg/dL — ABNORMAL HIGH (ref 65–99)

## 2016-05-13 NOTE — Progress Notes (Signed)
Physical Therapy Treatment Patient Details Name: Shelley [name] MRN: [id number] DOB: 05/04/1966 Today's Date: 05/13/2016    History of Present Illness Patient is a 50 yo female who lives in a Group Home setting admitted 05/03/16 with rectal bleed.  s/p hemorrhoidectomy 05/05/16, pt with post op VDRF, extubated11/26/17.   PMH:  home O2, mental retardation, anxiety, arthritis, DM, GIB, HTN    PT Comments    Pt in bed on 4 lts nasal 94%.  [facility] in [facility].  Pt required 4 lts O2 to achieve sats above 90%.  Noted dyspnea but appears close to prior level.   Follow Up Recommendations  Home health PT;Supervision for mobility/OOB Maryjean Ka([place])     Equipment Recommendations       Recommendations for Other Services       Precautions / Restrictions Precautions Precautions: Fall Precaution Comments: 2 falls in past 1 year/Home O2 Restrictions Weight Bearing Restrictions: No    Mobility  Bed Mobility Overal bed mobility: Needs Assistance Bed Mobility: Supine to Sit;Sit to Supine     Supine to sit: Min assist Sit to supine: Mod assist   General bed mobility comments: increased time with heavy use of rails and side rolling due to recent rectum surgery(pain)   Transfers Overall transfer level: Needs assistance Equipment used: Rolling walker (2 wheeled) Transfers: Sit to/from Stand Sit to Stand: Min guard         General transfer comment: one VC on safety with turns  Ambulation/Gait Ambulation/Gait assistance: Min guard Ambulation Distance (Feet): 38 Feet Assistive device: Rolling walker (2 wheeled) Gait Pattern/deviations: Step-to pattern;Trunk flexed Gait velocity: decreased   General Gait Details: x 4 standing rest breaks due to dyspnea.  Pt required 4 lts O2 to achieve sats >90.  pt stated she usually needs 3 lts.     Stairs            Wheelchair Mobility    Modified Rankin (Stroke Patients Only)       Balance                                    Cognition Arousal/Alertness: Awake/alert Behavior During Therapy: WFL for tasks assessed/performed Overall Cognitive Status: Within Functional Limits for tasks assessed                      Exercises      General Comments        Pertinent Vitals/Pain Pain Assessment: No/denies pain    Home Living                      Prior Function            PT Goals (current goals can now be found in the care plan section) Progress towards PT goals: Progressing toward goals    Frequency    Min 3X/week      PT Plan Current plan remains appropriate    Co-evaluation             End of Session Equipment Utilized During Treatment: Gait belt;Oxygen Activity Tolerance: Patient tolerated treatment well;No increased pain Patient left: in bed;with call bell/phone within reach     Time: 1105-1131 PT Time Calculation (min) (ACUTE ONLY): 26 min  Charges:  $Gait Training: 8-22 mins $Therapeutic Activity: 8-22 mins  G Codes:      Rica Koyanagi  PTA [facility]  Acute  Rehab Pager      [phone]

## 2016-05-13 NOTE — Progress Notes (Signed)
[facility]. Patient has a caretaker Kennon RoundsSally [phone] to assist patient with ADL's when needed. Kennon RoundsSally reports the [facility], pt had PT and a health aid. She reports if possible, a new home health service is preferable. LCSWA to inform RNCM.   Vivi BarrackNicole Deepak Bless, Theresia MajorsLCSWA, MSW Clinical Social Worker 5E and Psychiatric Service Line [phone]

## 2016-05-13 NOTE — Progress Notes (Signed)
PROGRESS NOTE    Shelley [name]  ZOX:[id number] DOB: 1965-12-05 DOA: 05/03/2016 PCP: Astrid Divine, MD    Brief Narrative:  50 y.o.female patient of Dr. Adria Devon. Known history of OSA/OHS on home BiPAP, Patient  on oxygen chronically at 2 L/m as well as 4 L/m at night while sleeping. Also has a known history of asthma. Patient has a history of nonadherence to noninvasive positive pressure ventilation at home. Patient presented with symptomatic anemia due to bleeding from hemorrhoids. Dr. Michaell Cowing performed hemorrhoidectomy on 11/23. Patient required reintubation and postanesthesia care unit and subsequently transferred to the intensive care unit for further treatment and stabilization postoperatively. She was subsequently transferred to telemetry to [facility] service.     Assessment & Plan:   Principal Problem:   Acute lower GI bleeding Active Problems:   Cognitive developmental delay   Obstructive sleep apnea   Chronic respiratory failure (HCC)   Iron deficiency anemia due to chronic blood loss   Hypokalemia   Diabetes mellitus (HCC)   Obesity, Class III, BMI 40-49.9 (morbid obesity) (HCC)   Bleeding internal hemorrhoids   Dependence on supplemental oxygen   Anxiety   Mental retardation   History of arteriovenous malformation (AVM)   Gastro-esophageal reflux disease without esophagitis   Acute blood loss anemia   Bleeding gastrointestinal   Obesity hypoventilation syndrome (HCC)   Chronic constipation   AVM (arteriovenous malformation) of colon   AKI (acute kidney injury) (HCC)   Acute on chronic respiratory failure (HCC)   Acute renal failure (ARF) (HCC)  #1 acute lower GI bleed secondary to bleeding internal hemorrhoids Status post hemorrhoidectomy 05/06/2016 per Dr. Michaell Cowing. Hemoglobin is 7.6 and stable. Status post 2 units packed red blood cells 05/03/2016 and 05/04/2016. Continue sitz baths. No NSAIDs. General surgery following and appreciate input and  recommendations.  #2 symptomatic anemia Secondary to problem #1. Patient status post 2 units packed red blood cells. H&H stable. Follow. Transfusion threshold hemoglobin less than 7.  #3 acute renal failure/acute kidney injury Likely secondary to ATN, multifactorial secondary to prerenal azotemia, IV diuretics, NSAIDs, IV gent, IV vancomycin. Patient euvolemic. Renal ultrasound with enlarged left kidney without evidence of hydronephrosis. Right kidney within normal limits. Renal function improving daily. Continue IV fluids for another 24-48 hours per nephrology. Follow.   #4 mental retardation   #5 acute on chronic respiratory failure Status post vent dependent respiratory failure postoperatively. Concern for right lower lobe pneumonia. Transitioned from IV Zosyn to oral Augmentin.  #6 asthma exacerbation Resolved. Continue Pulmicort and bronchodilators as needed.  #7 diabetes mellitus Hemoglobin A1c was 6. 03/11/2016. CBGs have ranged from 89-158. Sliding scale insulin.  #8 gastroesophageal reflux disease Continue Pepcid.  #9 obstructive sleep apnea BiPAP daily at bedtime.   DVT prophylaxis: SCDs Code Status: Full Family Communication: Updated patient. No family at bedside. Disposition Plan: Back to group home when medically stable and renal function back to baseline.   Consultants:   Nephrology: Dr.Shertz 05/11/2016  Procedures:   Chest x-ray 05/06/2016, 05/07/2016  Renal ultrasound 05/10/2016    Antimicrobials:   IV gentamicin 05/06/2006 1  IV clindamycin 05/06/2006 1  IV vancomycin 05/06/2016>>>. 05/08/2016  IV Zosyn 05/06/2016>>>> 05/11/2016  Oral Augmentin 05/12/2016   Subjective: Patient laying in bed. No chest pain. No shortness of breath. Denies any bleeding.  Objective: Vitals:   05/12/16 2055 05/13/16 0432 05/13/16 0617 05/13/16 0753  BP: 128/66  (!) 150/94   Pulse: 86  85   Resp: 18  18   Temp:  98.8 F (37.1 C)  98.4 F (36.9 C)    TempSrc: Oral  Oral   SpO2: 100%  99% 95%  Weight:  111.1 kg (245 lb)    Height:        Intake/Output Summary (Last 24 hours) at 05/13/16 1133 Last data filed at 05/13/16 1000  Gross per 24 hour  Intake          1778.33 ml  Output              400 ml  Net          1378.33 ml   Filed Weights   05/03/16 1905 05/12/16 0800 05/13/16 0432  Weight: 107 kg (235 lb 14.3 oz) 111.4 kg (245 lb 9.6 oz) 111.1 kg (245 lb)    Examination:  General exam: Appears calm and comfortable  Respiratory system: Clear to auscultation. Respiratory effort normal. Cardiovascular system: S1 & S2 heard, RRR. No JVD, murmurs, rubs, gallops or clicks. No pedal edema. Gastrointestinal system: Abdomen is nondistended, soft and nontender. No organomegaly or masses felt. Normal bowel sounds heard. Central nervous system: Alert and oriented. No focal neurological deficits. Extremities: Symmetric 5 x 5 power. Skin: No rashes, lesions or ulcers Psychiatry: Judgement and insight appear normal. Mood & affect appropriate.     Data Reviewed: I have personally reviewed following labs and imaging studies  CBC:  Recent Labs Lab 05/08/16 0327 05/09/16 0318 05/10/16 0945 05/11/16 0510 05/12/16 0553 05/13/16 0523  WBC 6.1 3.8* 3.2* 3.2* 3.1* 3.9*  NEUTROABS 2.7 2.2 1.7 1.3* 1.7  --   HGB 7.9* 8.0* 7.9* 7.4* 7.8* 7.6*  HCT 27.1* 27.9* 28.1* 26.3* 28.0* 26.3*  MCV 79.2 80.9 81.9 82.7 82.4 81.9  PLT 232 251 281 261 308 265   Basic Metabolic Panel:  Recent Labs Lab 05/06/16 1447 05/07/16 0401 05/08/16 0327 05/09/16 0318  05/10/16 0557 05/11/16 0510 05/11/16 1625 05/12/16 0553 05/13/16 0523  NA 139 136  137 139 141  < > 143 143 141 143 144  K 3.5 3.5  3.5 3.3* 3.2*  < > 4.1 4.2 3.7 4.2 4.4  CL 88* 90*  91* 94* 98*  < > 103 105 104 106 105  CO2 40* 36*  38* 37* 35*  < > 33* 31 31 32 32  GLUCOSE 187* 197*  201* 145* 132*  < > 146* 145* 126* 155* 148*  BUN 14 12  12 14 16   < > 13 17 18 17 17    CREATININE 0.64 0.68  0.71 0.69 1.73*  < > 1.84* 1.90* 1.81* 1.66* 1.53*  CALCIUM 8.3* 8.0*  8.1* 8.1* 8.0*  < > 8.3* 8.3* 8.4* 8.6* 8.5*  MG 1.4* 2.4 1.9 2.1  --  2.1  --   --   --   --   PHOS 2.1* 3.1 5.9* 5.3*  --   --   --   --   --   --   < > = values in this interval not displayed. GFR: Estimated Creatinine Clearance: 56.5 mL/min (by C-G formula based on SCr of 1.53 mg/dL (H)). Liver Function Tests:  Recent Labs Lab 05/06/16 1447 05/07/16 0401 05/08/16 0327 05/09/16 0318  ALBUMIN 2.9* 2.7* 2.7* 2.5*   No results for input(s): LIPASE, AMYLASE in the last 168 hours. No results for input(s): AMMONIA in the last 168 hours. Coagulation Profile: No results for input(s): INR, PROTIME in the last 168 hours. Cardiac Enzymes: No results for input(s): CKTOTAL, CKMB, CKMBINDEX, TROPONINI in the  last 168 hours. BNP (last 3 results) No results for input(s): PROBNP in the last 8760 hours. HbA1C: No results for input(s): HGBA1C in the last 72 hours. CBG:  Recent Labs Lab 05/12/16 0732 05/12/16 1153 05/12/16 1713 05/12/16 2109 05/13/16 0730  GLUCAP 127* 110* 157* 122* 136*   Lipid Profile: No results for input(s): CHOL, HDL, LDLCALC, TRIG, CHOLHDL, LDLDIRECT in the last 72 hours. Thyroid Function Tests: No results for input(s): TSH, T4TOTAL, FREET4, T3FREE, THYROIDAB in the last 72 hours. Anemia Panel: No results for input(s): VITAMINB12, FOLATE, FERRITIN, TIBC, IRON, RETICCTPCT in the last 72 hours. Sepsis Labs:  Recent Labs Lab 05/06/16 2100 05/07/16 0401 05/08/16 0327  PROCALCITON <0.10 <0.10 <0.10  LATICACIDVEN 1.8  --   --     Recent Results (from the past 240 hour(s))  Surgical pcr screen     Status: None   Collection Time: 05/06/16 12:02 AM  Result Value Ref Range Status   MRSA, PCR NEGATIVE NEGATIVE Final   Staphylococcus aureus NEGATIVE NEGATIVE Final    Comment:        The Xpert SA Assay (FDA approved for NASAL specimens in patients over 21 years of  age), is one component of a comprehensive surveillance program.  Test performance has been validated by [facility] for patients greater than or equal to 35 year old. It is not intended to diagnose infection nor to guide or monitor treatment.   Culture, respiratory (NON-Expectorated)     Status: None   Collection Time: 05/06/16  8:47 PM  Result Value Ref Range Status   Specimen Description TRACHEAL ASPIRATE  Final   Special Requests Normal  Final   Gram Stain   Final    ABUNDANT WBC PRESENT, PREDOMINANTLY PMN RARE SQUAMOUS EPITHELIAL CELLS PRESENT RARE BUDDING YEAST SEEN RARE GRAM POSITIVE COCCI IN PAIRS RARE GRAM VARIABLE ROD    Culture   Final    Consistent with normal respiratory flora. Performed at [facility]    Report Status 05/09/2016 FINAL  Final  Culture, blood (Routine X 2) w Reflex to ID Panel     Status: None   Collection Time: 05/06/16  9:00 PM  Result Value Ref Range Status   Specimen Description BLOOD LEFT ARM  Final   Special Requests BOTTLES DRAWN AEROBIC AND ANAEROBIC 8 CC  Final   Culture   Final    NO GROWTH 5 DAYS Performed at [facility]    Report Status 05/11/2016 FINAL  Final  Culture, blood (Routine X 2) w Reflex to ID Panel     Status: None   Collection Time: 05/06/16  9:00 PM  Result Value Ref Range Status   Specimen Description BLOOD RIGHT HAND  Final   Special Requests BOTTLES DRAWN AEROBIC AND ANAEROBIC 5 CC  Final   Culture   Final    NO GROWTH 5 DAYS Performed at [facility]    Report Status 05/11/2016 FINAL  Final  Culture, Urine     Status: None   Collection Time: 05/06/16  9:11 PM  Result Value Ref Range Status   Specimen Description URINE, CLEAN CATCH  Final   Special Requests Normal  Final   Culture NO GROWTH Performed at [facility]   Final   Report Status 05/08/2016 FINAL  Final  Culture, Urine     Status: None   Collection Time: 05/11/16 10:03 AM  Result Value Ref Range Status   Specimen  Description URINE, CLEAN CATCH  Final  Special Requests NONE  Final   Culture NO GROWTH [facility]   Final   Report Status 05/12/2016 FINAL  Final         Radiology Studies: No results found.      Scheduled Meds: . amoxicillin-clavulanate  1 tablet Oral Q12H  . atorvastatin  10 mg Oral q1800  . budesonide  0.25 mg Nebulization BID  . famotidine  20 mg Oral BID  . hydrocortisone-pramoxine  1 application Rectal QID  . insulin aspart  0-20 Units Subcutaneous TID WC  . insulin aspart  0-5 Units Subcutaneous QHS  . ipratropium-albuterol  3 mL Nebulization TID  . lip balm  1 application Topical BID  . montelukast  10 mg Oral QHS  . nystatin   Topical TID  . sertraline  100 mg Oral Daily  . vitamin C  500 mg Oral BID   Continuous Infusions: . sodium chloride 75 mL/hr at 05/13/16 0545     LOS: 10 days    Time spent: 35 minutes    Shelley Spizzirri, MD [facility] Pager [phone]  If 7PM-7AM, please contact night-coverage [email or web address] Password [email or web address] 05/13/2016, 11:33 AM

## 2016-05-13 NOTE — Progress Notes (Signed)
Pt refuses CPAP qhs.  Education provided.  RT will continue to monitor as needed.

## 2016-05-14 LAB — BASIC METABOLIC PANEL
ANION GAP: 8 (ref 5–15)
BUN: 14 mg/dL (ref 6–20)
CALCIUM: 8.6 mg/dL — AB (ref 8.9–10.3)
CO2: 33 mmol/L — ABNORMAL HIGH (ref 22–32)
Chloride: 101 mmol/L (ref 101–111)
Creatinine, Ser: 1.32 mg/dL — ABNORMAL HIGH (ref 0.44–1.00)
GFR calc Af Amer: 53 mL/min — ABNORMAL LOW (ref >60)
GFR, EST NON AFRICAN AMERICAN: 46 mL/min — AB (ref >60)
GLUCOSE: 149 mg/dL — AB (ref 65–99)
Potassium: 4.4 mmol/L (ref 3.5–5.1)
Sodium: 142 mmol/L (ref 135–145)

## 2016-05-14 LAB — CBC
HCT: 26.5 % — ABNORMAL LOW (ref 36.0–46.0)
HEMOGLOBIN: 7.4 g/dL — AB (ref 12.0–15.0)
MCH: 22.6 pg — ABNORMAL LOW (ref 26.0–34.0)
MCHC: 27.9 g/dL — AB (ref 30.0–36.0)
MCV: 81 fL (ref 78.0–100.0)
Platelets: 271 10*3/uL (ref 150–400)
RBC: 3.27 MIL/uL — ABNORMAL LOW (ref 3.87–5.11)
RDW: 21.7 % — AB (ref 11.5–15.5)
WBC: 3.9 10*3/uL — ABNORMAL LOW (ref 4.0–10.5)

## 2016-05-14 LAB — GLUCOSE, CAPILLARY
GLUCOSE-CAPILLARY: 141 mg/dL — AB (ref 65–99)
Glucose-Capillary: 138 mg/dL — ABNORMAL HIGH (ref 65–99)

## 2016-05-14 MED ORDER — IPRATROPIUM-ALBUTEROL 0.5-2.5 (3) MG/3ML IN SOLN: 3.0000 mL | Freq: Four times a day (QID) | RESPIRATORY_TRACT | 3 refills | Status: DC | PRN

## 2016-05-14 MED ORDER — OXYCODONE HCL 5 MG PO TABS: 5.0000 mg | ORAL_TABLET | ORAL | 0 refills | Status: DC | PRN

## 2016-05-14 MED ORDER — NYSTATIN 100000 UNIT/GM EX POWD: Freq: Three times a day (TID) | CUTANEOUS | 0 refills | Status: DC

## 2016-05-14 MED ORDER — HYDROCORTISONE ACE-PRAMOXINE 2.5-1 % RE CREA: 1.0000 "application " | TOPICAL_CREAM | Freq: Four times a day (QID) | RECTAL | 0 refills | Status: DC

## 2016-05-14 NOTE — Clinical Social Work Note (Signed)
OK per MD for d/c today- [facility] recommends [facility] PT.  Patient is aware and is agree with d/c plan. Per her permission- notified her friend Leodis RainsSally Ezzell who will come and pick her up around 4 pm. She will bring an oxygen tank with her for transport. Will notify RN of above.  RNCM will arrange home health.  No further SW needs identified. CSW signing off.  Lorri Frederickonna T. Jaci LazierCrowder, KentuckyLCSW [phone]  (weekend coverage)

## 2016-05-14 NOTE — Discharge Summary (Signed)
Physician Discharge Summary  ALISABETH SELKIRK ZOX:[id number] DOB: June 14, 1965 DOA: 05/03/2016  PCP: Astrid Divine, MD  Admit date: 05/03/2016 Discharge date: 05/14/2016  Time spent: 65 minutes  Recommendations for Outpatient Follow-up:  1. Follow-up with Astrid Divine, MD in 2 weeks. On follow-up patient will need a CBC done to follow-up on H&H. Patient will also need a basic metabolic profile done to follow-up on electrolytes and renal function. 2. Follow-up with Dr. Michaell Cowing, general surgery in 2-3 weeks.   Discharge Diagnoses:  Principal Problem:   Acute lower GI bleeding Active Problems:   Cognitive developmental delay   Obstructive sleep apnea   Chronic respiratory failure (HCC)   Iron deficiency anemia due to chronic blood loss   Hypokalemia   Diabetes mellitus (HCC)   Obesity, Class III, BMI 40-49.9 (morbid obesity) (HCC)   Bleeding internal hemorrhoids   Dependence on supplemental oxygen   Anxiety   Mental retardation   History of arteriovenous malformation (AVM)   Gastro-esophageal reflux disease without esophagitis   Acute blood loss anemia   Bleeding gastrointestinal   Obesity hypoventilation syndrome (HCC)   Chronic constipation   AVM (arteriovenous malformation) of colon   AKI (acute kidney injury) (HCC)   Acute on chronic respiratory failure (HCC)   Acute renal failure (ARF) (HCC)   Discharge Condition: Stable and improved.  Diet recommendation: Heart healthy  Filed Weights   05/12/16 0800 05/13/16 0432 05/14/16 0441  Weight: 111.4 kg (245 lb 9.6 oz) 111.1 kg (245 lb) 107.8 kg (237 lb 9.6 oz)    History of present illness:  Per Dr Scharlene Gloss is a 50 y.o. female with a history of mild MR, diverticulosis, internal hemorrhoids, cecal AVM, GERD, OHS, OSA, asthma on home oxygen, and chronic anemia who was brought by EMS to [facility] due to rectal bleeding.   She reported BRBPR with BMs for 3 days. No constipation or diarrhea, but bright  red blood follows every BM and otherwise she notes no bleeding. She has associated generalized abdominal mild pain "aching," nonradiating, waxing/waning. No medications tried. She denied palpitations, dyspnea, weakness, N/V, and fevers. She's had multiple admissions for similar presentations and reported hemorrhoid surgery performed by Dr. Elnoria Howard a week ago. On arrival she was in no distress, afebrile, HR 89, BP 128/57, SpO2 on 2L O2. Abdomen was benign with mild lower tenderness, no hemorrhoids or gross blood noted. Labs showed K 2.7, bicarb 41, Mg 1.5, and hgb 7.1 (from 9.7 at last check). She was given potassium, 1u PRBCs transfusion was ordered, and TRH was asked to admit. EDP contacted GI, Dr. Elnoria Howard for consultation.    Hospital Course:  #1 acute lower GI bleed secondary to bleeding internal hemorrhoids Status post hemorrhoidectomy 05/06/2016 per Dr. Michaell Cowing. Patient's surgery was successful. Patient was transfused a total of 2 units packed red blood cells  05/03/2016 and 05/04/2016. Patient was maintained on sitz baths. No NSAIDs. General surgery followed the patient during the hospitalization. Patient improved clinically and hemoglobin stabilized. Patient be discharged home in stable and improved condition and is to follow-up with general surgery as outpatient.   #2 symptomatic anemia Secondary to problem #1. Patient status post 2 units packed red blood cells. H&H stable. Outpatient follow-up.  #3 acute renal failure/acute kidney injury Likely secondary to ATN, multifactorial secondary to prerenal azotemia, IV diuretics, NSAIDs, IV gent, IV vancomycin. Patient euvolemic. Renal ultrasound with enlarged left kidney without evidence of hydronephrosis. Right kidney within normal limits. Nephrology was consulted and recommended gentle hydration.  Patient's renal function improved on a daily basis such that by day of discharge patient's creatinine was down to 1.32 from 1.90 during the hospitalization.  Patient will be discharged home in stable and improved condition on the to follow-up with PCP as outpatient.   #4 mental retardation   #5 acute on chronic respiratory failure Status post vent dependent respiratory failure postoperatively. Patient was on the critical care service subsequently extubated. Concern for right lower lobe pneumonia, and a such patient was placed empirically on IV Zosyn, IV vancomycin, IV clindamycin. Patient was also placed on breathing treatments. Patient improved clinically was subsequently transitioned to oral Augmentin. Patient completed her full course of antibiotic treatment and needs no further antibiotics on discharge.   #6 asthma exacerbation Resolved. Patient was maintained on Pulmicort and bronchodilators as needed.  #7 diabetes mellitus Hemoglobin A1c was 6. 03/11/2016. Patient was maintained on a sliding scale insulin.   #8 gastroesophageal reflux disease Patient was placed on Pepcid.  #9 obstructive sleep apnea BiPAP daily at bedtime.   Procedures:  Chest x-ray 05/06/2016, 05/07/2016  Renal ultrasound 05/10/2016  Consultations:  Nephrology: Dr.Shertz 05/11/2016  Gen. surgery: Dr. gross 05/05/2016  Pulmonary Dr. Maple HudsonYoung 05/05/2016   Discharge Exam: Vitals:   05/14/16 0441 05/14/16 1334  BP: (!) 152/85 138/79  Pulse: 87 87  Resp: 20 18  Temp: 98.1 F (36.7 C) 99.3 F (37.4 C)    General: NAD Cardiovascular: RRR Respiratory: CTAB  Discharge Instructions   Discharge Instructions    Call MD for:    Complete by:  As directed    Temperature > 101.75F   Call MD for:  extreme fatigue    Complete by:  As directed    Call MD for:  hives    Complete by:  As directed    Call MD for:  persistant nausea and vomiting    Complete by:  As directed    Call MD for:  redness, tenderness, or signs of infection (pain, swelling, redness, odor or green/yellow discharge around incision site)    Complete by:  As directed    Call MD  for:  severe uncontrolled pain    Complete by:  As directed    Diet - low sodium heart healthy    Complete by:  As directed    Start with bland, low residue diet for a few days, then advance to a heart healthy (low fat, high fiber) diet.  If you feel nauseated or constipated, simplify to a liquid only diet for 48 hours until you are feeling better (no more nausea, farting/passing gas, having a bowel movement, etc...).  If you cannot tolerate even drinking liquids, or feeling worse, let your surgeon know or go to the Emergency Department for help.   Diet general    Complete by:  As directed    Discharge instructions    Complete by:  As directed    Please see discharge instruction sheets.   Also refer to any handouts/printouts that may have been given from the [facility] surgery office (if you visited us there before surgery) Please call our office if you have any questions or concerns [phone]   Discharge wound care:    Complete by:  As directed    If you have closed incisions: Shower and bathe over these incisions with soap and water every day.  It is OK to wash over the dressings: they are waterproof. Remove all surgical dressings on postoperative day #3.  You do not need to  replace dressings over the closed incisions unless you feel more comfortable with a Band-Aid covering it.   If you have an open wound: That requires packing, so please see wound care instructions.   In general, remove all dressings, wash wound with soap and water and then replace with saline moistened gauze.  Do the dressing change at least every day.    Please call our office [phone] if you have further questions.   Driving Restrictions    Complete by:  As directed    No driving until off narcotics and can safely swerve away without pain during an emergency   Increase activity slowly    Complete by:  As directed    Increase activity slowly    Complete by:  As directed    Lifting restrictions    Complete by:   As directed    Avoid heavy lifting initially, <20 pounds at first.   Do not push through pain.   You have no specific weight limit: If it hurts to do, DON'T DO IT.    If you feel no pain, you are not injuring anything.  Pain will protect you from injury.   Coughing and sneezing are far more stressful to your incision than any lifting.   Avoid resuming heavy lifting (>50 pounds) or other intense activity until off all narcotic pain medications.   When want to exercise more, give yourself 2 weeks to gradually get back to full intense exercise/activity.   May shower / Bathe    Complete by:  As directed    SHOWER EVERY DAY.  It is fine for dressings or wounds to be washed/rinsed.  Use gentle soap & water.  This will help the incisions and/or wounds get clean & minimize infection.   May walk up steps    Complete by:  As directed    Sexual Activity Restrictions    Complete by:  As directed    Sexual activity as tolerated.  Do not push through pain.  Pain will protect you from injury.   Walk with assistance    Complete by:  As directed    Walk over an hour a day.  May use a walker/cane/companion to help with balance and stamina.     Current Discharge Medication List    START taking these medications   Details  hydrocortisone-pramoxine (ANALPRAM-HC) 2.5-1 % rectal cream Place 1 application rectally 4 (four) times daily. Qty: 30 g, Refills: 0    ipratropium-albuterol (DUONEB) 0.5-2.5 (3) MG/3ML SOLN Take 3 mLs by nebulization every 6 (six) hours as needed (SOB/WHEEZING). Qty: 360 mL, Refills: 3    nystatin (MYCOSTATIN/NYSTOP) powder Apply topically 3 (three) times daily. Qty: 15 g, Refills: 0    oxyCODONE (OXY IR/ROXICODONE) 5 MG immediate release tablet Take 1-3 tablets (5-15 mg total) by mouth every 4 (four) hours as needed for moderate pain, severe pain or breakthrough pain. Qty: 30 tablet, Refills: 0      CONTINUE these medications which have NOT CHANGED   Details  albuterol  (PROVENTIL) (2.5 MG/3ML) 0.083% nebulizer solution Take 3 mLs (2.5 mg total) by nebulization every 6 (six) hours as needed for wheezing or shortness of breath. Qty: 120 vial, Refills: 5    atorvastatin (LIPITOR) 10 MG tablet Take 10 mg by mouth daily.    budesonide (PULMICORT) 0.25 MG/2ML nebulizer solution Take 0.25 mg by nebulization 2 (two) times daily.     famotidine (PEPCID) 20 MG tablet Take 1 tablet (20 mg total) by mouth  at bedtime. Qty: 30 tablet, Refills: 0    FERREX 150 150 MG capsule Take 150 mg by mouth daily. Refills: 6    losartan-hydrochlorothiazide (HYZAAR) 100-12.5 MG tablet Take 1 tablet by mouth daily.     metFORMIN (GLUCOPHAGE) 500 MG tablet Take 1,000 mg by mouth every evening.     montelukast (SINGULAIR) 10 MG tablet Take 10 mg by mouth at bedtime.    !! omeprazole (PRILOSEC) 40 MG capsule Take 40 mg by mouth daily.     !! sertraline (ZOLOFT) 100 MG tablet Take 100 mg by mouth daily.     loratadine (CLARITIN) 10 MG tablet Take 10 mg by mouth daily as needed for allergies.     losartan-hydrochlorothiazide (HYZAAR) 50-12.5 MG tablet Take 1 tablet by mouth daily. Qty: 30 tablet, Refills: 0    !! omeprazole (PRILOSEC) 20 MG capsule Take 40 mg by mouth daily.    !! sertraline (ZOLOFT) 50 MG tablet Take 100 mg by mouth daily.      !! - Potential duplicate medications found. Please discuss with provider.     Allergies  Allergen Reactions  . Fish Allergy Swelling  . Pepto-Bismol [Bismuth Subsalicylate] Nausea And Vomiting  . Cortisone Itching and Rash    Injections and cream only.  PO ok.  . Trichophyton Itching    Also sneezing accompanying as well   Follow-up Information    GROSS,STEVEN C., MD. Schedule an appointment as soon as possible for a visit in 2 week(s).   Specialty:  General Surgery Why:  please call to make appointment for 2-3 weeks after discharge to follow up after your operation, To follow up after your hospital stay Contact  information: [address] [place] [address] [phone]        [facility] Follow up.   Specialty:  Home Health Services Why:  Home Health RN, Physical Therapy, Occupational Therapy, aide and Social Worker  Contact information: [address] [place] [address] [phone]        Astrid Divine, MD. Schedule an appointment as soon as possible for a visit in 2 week(s).   Specialty:  Family Medicine Contact information: [address] [place] [address] [phone]            The results of significant diagnostics from this hospitalization (including imaging, microbiology, ancillary and laboratory) are listed below for reference.    Significant Diagnostic Studies: US Renal  Result Date: 05/10/2016 CLINICAL DATA:  Acute renal failure EXAM: RENAL / URINARY TRACT ULTRASOUND COMPLETE COMPARISON:  02/20/2013 FINDINGS: Right Kidney: Length: 12.0 cm. Echogenicity within normal limits. No mass or hydronephrosis visualized. Left Kidney: Length: 17.3 cm. Echogenicity within normal limits. No mass or hydronephrosis visualized. There is a minimally complex cyst in the central left kidney. Bladder: Appears normal for degree of bladder distention. IMPRESSION: The left kidney has become enlarged without evidence of hydronephrosis. This is of unknown significance. Differential diagnosis includes an acute process such as acute tibial necrosis or a chronic process such as infiltration with tumor. Consider further imaging to characterize. The right kidney is within normal limits. Electronically Signed   By: Jolaine Click M.D.   On: 05/10/2016 15:34   Portable Chest Xray  Result Date: 05/07/2016 CLINICAL DATA:  Respiratory failure EXAM: PORTABLE CHEST 1 VIEW COMPARISON:  05/06/2016 FINDINGS: Cardiac shadow remains enlarged. Endotracheal tube is again seen and stable. The lungs are well aerated bilaterally. Minimal likely  atelectatic changes are  noted in the medial right lung base. No other focal abnormality is seen. IMPRESSION: Right basilar atelectasis. Electronically Signed   By: Alcide Clever M.D.   On: 05/07/2016 07:11   Dg Chest Port 1 View  Result Date: 05/06/2016 CLINICAL DATA:  Fever and asthma EXAM: PORTABLE CHEST 1 VIEW COMPARISON:  Chest radiograph 03/10/2016 FINDINGS: Endotracheal tube tip is at the level of the clavicular heads 3 cm above the inferior margin of the carina. Cardiac silhouette is unchanged size, mildly enlarged. There is no pulmonary edema or focal airspace consolidation. There is shallow lung inflation. No pneumothorax or sizable pleural effusion. IMPRESSION: 1. Radiographically appropriate positioning of the endotracheal tube. 2. Mild cardiomegaly without overt pulmonary edema. Electronically Signed   By: Deatra Robinson M.D.   On: 05/06/2016 20:41    Microbiology: Recent Results (from the past 240 hour(s))  Surgical pcr screen     Status: None   Collection Time: 05/06/16 12:02 AM  Result Value Ref Range Status   MRSA, PCR NEGATIVE NEGATIVE Final   Staphylococcus aureus NEGATIVE NEGATIVE Final    Comment:        The Xpert SA Assay (FDA approved for NASAL specimens in patients over 42 years of age), is one component of a comprehensive surveillance program.  Test performance has been validated by [facility] for patients greater than or equal to 20 year old. It is not intended to diagnose infection nor to guide or monitor treatment.   Culture, respiratory (NON-Expectorated)     Status: None   Collection Time: 05/06/16  8:47 PM  Result Value Ref Range Status   Specimen Description TRACHEAL ASPIRATE  Final   Special Requests Normal  Final   Gram Stain   Final    ABUNDANT WBC PRESENT, PREDOMINANTLY PMN RARE SQUAMOUS EPITHELIAL CELLS PRESENT RARE BUDDING YEAST SEEN RARE GRAM POSITIVE COCCI IN PAIRS RARE GRAM VARIABLE ROD    Culture   Final    Consistent with normal  respiratory flora. Performed at [facility]    Report Status 05/09/2016 FINAL  Final  Culture, blood (Routine X 2) w Reflex to ID Panel     Status: None   Collection Time: 05/06/16  9:00 PM  Result Value Ref Range Status   Specimen Description BLOOD LEFT ARM  Final   Special Requests BOTTLES DRAWN AEROBIC AND ANAEROBIC 8 CC  Final   Culture   Final    NO GROWTH 5 DAYS Performed at [facility]    Report Status 05/11/2016 FINAL  Final  Culture, blood (Routine X 2) w Reflex to ID Panel     Status: None   Collection Time: 05/06/16  9:00 PM  Result Value Ref Range Status   Specimen Description BLOOD RIGHT HAND  Final   Special Requests BOTTLES DRAWN AEROBIC AND ANAEROBIC 5 CC  Final   Culture   Final    NO GROWTH 5 DAYS Performed at [facility]    Report Status 05/11/2016 FINAL  Final  Culture, Urine     Status: None   Collection Time: 05/06/16  9:11 PM  Result Value Ref Range Status   Specimen Description URINE, CLEAN CATCH  Final   Special Requests Normal  Final   Culture NO GROWTH Performed at [facility]   Final   Report Status 05/08/2016 FINAL  Final  Culture, Urine     Status: None   Collection Time: 05/11/16 10:03 AM  Result Value Ref Range Status   Specimen  Description URINE, CLEAN CATCH  Final   Special Requests NONE  Final   Culture NO GROWTH Performed at [facility]   Final   Report Status 05/12/2016 FINAL  Final     Labs: Basic Metabolic Panel:  Recent Labs Lab 05/08/16 0327 05/09/16 1610  05/10/16 0557 05/11/16 0510 05/11/16 1625 05/12/16 0553 05/13/16 0523 05/14/16 0557  NA 139 141  < > 143 143 141 143 144 142  K 3.3* 3.2*  < > 4.1 4.2 3.7 4.2 4.4 4.4  CL 94* 98*  < > 103 105 104 106 105 101  CO2 37* 35*  < > 33* 31 31 32 32 33*  GLUCOSE 145* 132*  < > 146* 145* 126* 155* 148* 149*  BUN 14 16  < > 13 17 18 17 17 14   CREATININE 0.69 1.73*  < > 1.84* 1.90* 1.81* 1.66* 1.53* 1.32*  CALCIUM 8.1* 8.0*  < >  8.3* 8.3* 8.4* 8.6* 8.5* 8.6*  MG 1.9 2.1  --  2.1  --   --   --   --   --   PHOS 5.9* 5.3*  --   --   --   --   --   --   --   < > = values in this interval not displayed. Liver Function Tests:  Recent Labs Lab 05/08/16 0327 05/09/16 0318  ALBUMIN 2.7* 2.5*   No results for input(s): LIPASE, AMYLASE in the last 168 hours. No results for input(s): AMMONIA in the last 168 hours. CBC:  Recent Labs Lab 05/08/16 0327 05/09/16 0318 05/10/16 0945 05/11/16 0510 05/12/16 0553 05/13/16 0523 05/14/16 0557  WBC 6.1 3.8* 3.2* 3.2* 3.1* 3.9* 3.9*  NEUTROABS 2.7 2.2 1.7 1.3* 1.7  --   --   HGB 7.9* 8.0* 7.9* 7.4* 7.8* 7.6* 7.4*  HCT 27.1* 27.9* 28.1* 26.3* 28.0* 26.3* 26.5*  MCV 79.2 80.9 81.9 82.7 82.4 81.9 81.0  PLT 232 251 281 261 308 265 271   Cardiac Enzymes: No results for input(s): CKTOTAL, CKMB, CKMBINDEX, TROPONINI in the last 168 hours. BNP: BNP (last 3 results) No results for input(s): BNP in the last 8760 hours.  ProBNP (last 3 results) No results for input(s): PROBNP in the last 8760 hours.  CBG:  Recent Labs Lab 05/13/16 1151 05/13/16 1652 05/13/16 2034 05/14/16 0729 05/14/16 1133  GLUCAP 158* 89 159* 141* 138*       Signed:  Arlind Klingerman MD.  [facility] 05/14/2016, 3:55 PM

## 2016-05-14 NOTE — Care Management Note (Signed)
Case Management Note  Patient Details  Name: Vickii Pennaenny A Steffek MRN: [id number] Date of Birth: 01/20/1966  Subjective/Objective:   Acute lower GI bleed s/t bleeding internal hemorrhoids                 Action/Plan: Discharge Planning: AVS reviewed: Chart reviewed. [facility] Contacted [facility] Liaison for resumption of care with PT, OT, aide and SW added. [facility] [place]    Berneice HeinrichPCP-GRIFFIN, ELAINE MD  Expected Discharge Date:   05/14/2016        Expected Discharge Plan:  Home w Home Health Services  In-House Referral:  Clinical Social Work  Discharge planning Services  CM Consult  Post Acute Care Choice:  Home Health Choice offered to:  Patient  DME Arranged:  N/A DME Agency:  NA  HH Arranged:  RN, PT, OT, Nurse's Aide, Social Work [employer] [facility]  Status of Service:  Completed, signed off  If discussed at [employer] Meetings, dates discussed:    Additional Comments:  Elliot CousinShavis, Alvin Rubano Ellen, RN 05/14/2016, 3:30 PM

## 2016-05-14 NOTE — Progress Notes (Addendum)
3:33 PM      [] Hide copied text [] Hover for attribution information Discharge instructions discussed with patient and caregiver. They verbalize understanding of all. Patient instructed to drink plenty of water over the next few days. Patient stable at time of discharge with stable vitals, in no distress, alert, oriented x4 and appropriate for her baseline mental status. IV access removed. Patient taken via wheelchair to personal vehicle in care of caregiver who [facility] 4 prescriptions given, including one for Oxycodone, patient informed this will cause constipation and to use miralax or colace per md recommendations.    Patient and caregiver expressing concern [facility] Request[facility] new [facility]. Alesia Case Manager notified and states she will [facility] [place] [facility] and that caregiver could attempt to find a new company if she would like, and caregiver needs to tell [facility] services. Caregiver informed and she will contact [facility].

## 2016-05-24 ENCOUNTER — Other Ambulatory Visit: Payer: Self-pay | Admitting: Family Medicine

## 2016-05-24 DIAGNOSIS — S37009A Unspecified injury of unspecified kidney, initial encounter: Secondary | ICD-10-CM

## 2016-06-14 DIAGNOSIS — K922 Gastrointestinal hemorrhage, unspecified: Secondary | ICD-10-CM | POA: Diagnosis not present

## 2016-06-14 DIAGNOSIS — R2681 Unsteadiness on feet: Secondary | ICD-10-CM | POA: Diagnosis not present

## 2016-06-14 DIAGNOSIS — R279 Unspecified lack of coordination: Secondary | ICD-10-CM | POA: Diagnosis not present

## 2016-06-14 DIAGNOSIS — M6281 Muscle weakness (generalized): Secondary | ICD-10-CM | POA: Diagnosis not present

## 2016-06-14 DIAGNOSIS — K648 Other hemorrhoids: Secondary | ICD-10-CM | POA: Diagnosis not present

## 2016-06-21 ENCOUNTER — Other Ambulatory Visit: Payer: Self-pay

## 2016-07-07 ENCOUNTER — Encounter: Payer: Self-pay | Admitting: Pulmonary Disease

## 2016-07-07 ENCOUNTER — Ambulatory Visit (INDEPENDENT_AMBULATORY_CARE_PROVIDER_SITE_OTHER): Payer: Medicare HMO | Admitting: Pulmonary Disease

## 2016-07-07 ENCOUNTER — Other Ambulatory Visit (INDEPENDENT_AMBULATORY_CARE_PROVIDER_SITE_OTHER): Payer: Medicare HMO

## 2016-07-07 VITALS — BP 108/78 | HR 82 | Ht 67.0 in | Wt 236.6 lb

## 2016-07-07 DIAGNOSIS — J45998 Other asthma: Secondary | ICD-10-CM | POA: Diagnosis not present

## 2016-07-07 DIAGNOSIS — E662 Morbid (severe) obesity with alveolar hypoventilation: Secondary | ICD-10-CM

## 2016-07-07 DIAGNOSIS — R0902 Hypoxemia: Secondary | ICD-10-CM | POA: Diagnosis not present

## 2016-07-07 DIAGNOSIS — K922 Gastrointestinal hemorrhage, unspecified: Secondary | ICD-10-CM | POA: Diagnosis not present

## 2016-07-07 DIAGNOSIS — J45909 Unspecified asthma, uncomplicated: Secondary | ICD-10-CM | POA: Diagnosis not present

## 2016-07-07 DIAGNOSIS — G4733 Obstructive sleep apnea (adult) (pediatric): Secondary | ICD-10-CM | POA: Diagnosis not present

## 2016-07-07 DIAGNOSIS — J9610 Chronic respiratory failure, unspecified whether with hypoxia or hypercapnia: Secondary | ICD-10-CM

## 2016-07-07 LAB — COMPREHENSIVE METABOLIC PANEL
ALK PHOS: 64 U/L (ref 39–117)
ALT: 11 U/L (ref 0–35)
AST: 12 U/L (ref 0–37)
Albumin: 3.7 g/dL (ref 3.5–5.2)
BILIRUBIN TOTAL: 0.2 mg/dL (ref 0.2–1.2)
BUN: 14 mg/dL (ref 6–23)
CO2: 40 meq/L — AB (ref 19–32)
CREATININE: 0.53 mg/dL (ref 0.40–1.20)
Calcium: 9.2 mg/dL (ref 8.4–10.5)
Chloride: 96 mEq/L (ref 96–112)
GFR: 129.28 mL/min (ref >60.00)
GLUCOSE: 104 mg/dL — AB (ref 70–99)
Potassium: 4 mEq/L (ref 3.5–5.1)
Sodium: 140 mEq/L (ref 135–145)
TOTAL PROTEIN: 8 g/dL (ref 6.0–8.3)

## 2016-07-07 LAB — CBC
HCT: 27.3 % — ABNORMAL LOW (ref 36.0–46.0)
MCHC: 32.1 g/dL (ref 30.0–36.0)
MCV: 74.3 fl — ABNORMAL LOW (ref 78.0–100.0)
PLATELETS: 188 10*3/uL (ref 150.0–400.0)
RBC: 3.68 Mil/uL — ABNORMAL LOW (ref 3.87–5.11)
RDW: 18.7 % — AB (ref 11.5–15.5)
WBC: 4.4 10*3/uL (ref 4.0–10.5)

## 2016-07-07 NOTE — Progress Notes (Signed)
Current Outpatient Prescriptions on File Prior to Visit  Medication Sig  . albuterol (PROVENTIL) (2.5 MG/3ML) 0.083% nebulizer solution Take 3 mLs (2.5 mg total) by nebulization every 6 (six) hours as needed for wheezing or shortness of breath.  Marland Kitchen. atorvastatin (LIPITOR) 10 MG tablet Take 10 mg by mouth daily.  . budesonide (PULMICORT) 0.25 MG/2ML nebulizer solution Take 0.25 mg by nebulization 2 (two) times daily.   . famotidine (PEPCID) 20 MG tablet Take 1 tablet (20 mg total) by mouth at bedtime.  Marland Kitchen. FERREX 150 150 MG capsule Take 150 mg by mouth daily.  . hydrocortisone-pramoxine (ANALPRAM-HC) 2.5-1 % rectal cream Place 1 application rectally 4 (four) times daily.  Marland Kitchen. ipratropium-albuterol (DUONEB) 0.5-2.5 (3) MG/3ML SOLN Take 3 mLs by nebulization every 6 (six) hours as needed (SOB/WHEEZING).  Marland Kitchen. loratadine (CLARITIN) 10 MG tablet Take 10 mg by mouth daily as needed for allergies.   Marland Kitchen. losartan-hydrochlorothiazide (HYZAAR) 100-12.5 MG tablet Take 1 tablet by mouth daily.   . metFORMIN (GLUCOPHAGE) 500 MG tablet Take 1,000 mg by mouth every evening.   . montelukast (SINGULAIR) 10 MG tablet Take 10 mg by mouth at bedtime.  Marland Kitchen. nystatin (MYCOSTATIN/NYSTOP) powder Apply topically 3 (three) times daily.  Marland Kitchen. omeprazole (PRILOSEC) 20 MG capsule Take 40 mg by mouth daily.  Marland Kitchen. omeprazole (PRILOSEC) 40 MG capsule Take 40 mg by mouth daily.   Marland Kitchen. oxyCODONE (OXY IR/ROXICODONE) 5 MG immediate release tablet Take 1-3 tablets (5-15 mg total) by mouth every 4 (four) hours as needed for moderate pain, severe pain or breakthrough pain.  Marland Kitchen. sertraline (ZOLOFT) 50 MG tablet Take 100 mg by mouth daily.    No current facility-administered medications on file prior to visit.     Chief Complaint  Patient presents with  . Follow-up    Pt not using BiPAP x 1 year. Using O2 @ 3 liters with exertion and rest with POC and 4 liters at night.  Denies any breathing issues other than a little discolored mucus production.     Sleep tests PSG 09/13/10>>AHI 98.6, SpO2 60% ONO with CPAP and 3 liters 01/13/12>>Test time 7 hrs 6 min. Mean SpO2 89%, low SpO2 62%. Spent 2 hrs 34 min with SpO2 < 88% CPAP 12/16/11 to 01/14/12>>Used on 6 of 30 nights with average 7 hrs 3 min. Average AHI 1.8 with CPAP 10 cm H2O. ONO with CPAP and 3 liters 01/13/12 >> Test time 7 hrs 6 min.  Mean SpO2 89%, low SpO2 62%.  Spent 2 hrs 34 min with SpO2 < 88%. ONO with CPAP and 4 liters 01/31/12 >> Test time 7 hrs 36 min.  Mean SpO2 91.6%, low SpO2 73%.  Spent 56 min with SpO2 < 88%. BPAP 02/20/12>>17/11 cm H2O with 4 liters oxygen.  Past medical history DM, HTN, HLD, Anxiety, GERD, HH, Developmental delay  Past surgical history, Family history, Social history, Allergies reviewed  Vital signs BP 108/78 (BP Location: Left Arm, Cuff Size: Normal)   Pulse 82   Ht 5\' 7"  (1.702 m)   Wt 236 lb 9.6 oz (107.3 kg)   SpO2 92%   BMI 37.06 kg/m   History of Present Illness: Shelley [name] is a 51 y.o. female with OSA, OHS, and asthma.  She was in hospital for lower GI bleeding complicated by PNA and asthma exacerbation.  Her breathing has been okay.  She is not having cough, wheeze, or sputum.  She denies abdominal pain and hasn't noticed any additional bleeding.  She sometimes wears  bipap.  She usually falls asleep before wearing it.  She noticed her sleep was much better in hospital when she wore bipap.  Physical Exam: General - pale, wearing oxygen ENT - no sinus tenderness, MP 4, no LAN Cardiac - regular, no murmur Chest - no wheeze, rales Back - no tenderness Abd - soft, non tender Ext - no edema Neuro - normal strength Skin - no rashes Psych - pleasant demeanor   CMP Latest Ref Rng & Units 05/14/2016 05/13/2016 05/12/2016  Glucose 65 - 99 mg/dL 696(E) 952(W) 413(K)  BUN 6 - 20 mg/dL 14 17 17   Creatinine 0.44 - 1.00 mg/dL 4.40(N) 0.27(O) 5.36(U)  Sodium 135 - 145 mmol/L 142 144 143  Potassium 3.5 - 5.1 mmol/L 4.4 4.4 4.2   Chloride 101 - 111 mmol/L 101 105 106  CO2 22 - 32 mmol/L 33(H) 32 32  Calcium 8.9 - 10.3 mg/dL 4.4(I) 3.4(V) 4.2(V)  Total Protein 6.5 - 8.1 g/dL - - -  Total Bilirubin 0.3 - 1.2 mg/dL - - -  Alkaline Phos 38 - 126 U/L - - -  AST 15 - 41 U/L - - -  ALT 14 - 54 U/L - - -    CBC Latest Ref Rng & Units 05/14/2016 05/13/2016 05/12/2016  WBC 4.0 - 10.5 K/uL 3.9(L) 3.9(L) 3.1(L)  Hemoglobin 12.0 - 15.0 g/dL 7.4(L) 7.6(L) 7.8(L)  Hematocrit 36.0 - 46.0 % 26.5(L) 26.3(L) 28.0(L)  Platelets 150 - 400 K/uL 271 265 308     Assessment/Plan:  Recent lower GI bleeding from internal hemorrhoids with anemia.. - will repeat CBC and CMET today and route results to her PCP  Obstructive sleep apnea. - she has difficulty with Bipap compliance - discussed techniques to have her use Bipap on regular basis  Chronic respiratory failure with hypoxia from OHS. - continue 4 liters oxygen with Bipap - she can adjust her oxygen during the day based on pulse ox reading with goal > 90% - she is to continue 4 liters oxygen at night with BiPAP  Persistent asthma. - continue pulmicort daily and singulair - prn albuterol  Morbid obesity. - discussed importance of weight loss  Goals of care. - DNR/DNI   Patient Instructions  Lab tests today  Follow up in 6 months   [name], MD [facility]/Sleep Pager:  [phone] 07/07/2016, 12:02 PM

## 2016-07-07 NOTE — Patient Instructions (Signed)
Lab tests today Follow up in 6 months 

## 2016-07-12 DIAGNOSIS — R41841 Cognitive communication deficit: Secondary | ICD-10-CM | POA: Diagnosis not present

## 2016-07-12 DIAGNOSIS — Z9181 History of falling: Secondary | ICD-10-CM | POA: Diagnosis not present

## 2016-07-12 DIAGNOSIS — M6281 Muscle weakness (generalized): Secondary | ICD-10-CM | POA: Diagnosis not present

## 2016-07-13 ENCOUNTER — Telehealth: Payer: Self-pay | Admitting: Pulmonary Disease

## 2016-07-13 DIAGNOSIS — R41841 Cognitive communication deficit: Secondary | ICD-10-CM | POA: Diagnosis not present

## 2016-07-13 DIAGNOSIS — R69 Illness, unspecified: Secondary | ICD-10-CM | POA: Diagnosis not present

## 2016-07-13 DIAGNOSIS — M6281 Muscle weakness (generalized): Secondary | ICD-10-CM | POA: Diagnosis not present

## 2016-07-13 DIAGNOSIS — Z9181 History of falling: Secondary | ICD-10-CM | POA: Diagnosis not present

## 2016-07-13 NOTE — Telephone Encounter (Signed)
CMP Latest Ref Rng & Units 07/07/2016 05/14/2016 05/13/2016  Glucose 70 - 99 mg/dL 454(U104(H) 981(X149(H) 914(N148(H)  BUN 6 - 23 mg/dL 14 14 17   Creatinine 0.40 - 1.20 mg/dL 8.290.53 5.62(Z1.32(H) 3.08(M1.53(H)  Sodium 135 - 145 mEq/L 140 142 144  Potassium 3.5 - 5.1 mEq/L 4.0 4.4 4.4  Chloride 96 - 112 mEq/L 96 101 105  CO2 19 - 32 mEq/L 40(H) 33(H) 32  Calcium 8.4 - 10.5 mg/dL 9.2 5.7(Q8.6(L) 4.6(N8.5(L)  Total Protein 6.0 - 8.3 g/dL 8.0 - -  Total Bilirubin 0.2 - 1.2 mg/dL 0.2 - -  Alkaline Phos 39 - 117 U/L 64 - -  AST 0 - 37 U/L 12 - -  ALT 0 - 35 U/L 11 - -    CBC Latest Ref Rng & Units 07/07/2016 05/14/2016 05/13/2016  WBC 4.0 - 10.5 K/uL 4.4 3.9(L) 3.9(L)  Hemoglobin 12.0 - 15.0 g/dL 8.8 Repeated and verified X2.(L) 7.4(L) 7.6(L)  Hematocrit 36.0 - 46.0 % 27.3(L) 26.5(L) 26.3(L)  Platelets 150.0 - 400.0 K/uL 188.0 271 265    Will have my nurse inform pt that blood counts look better, but still low.  She will need to have continued follow up with her PCP to monitor anemia.  Please fax lab tests from 07/07/16 to her PCP.

## 2016-07-14 DIAGNOSIS — R41841 Cognitive communication deficit: Secondary | ICD-10-CM | POA: Diagnosis not present

## 2016-07-14 DIAGNOSIS — Z9181 History of falling: Secondary | ICD-10-CM | POA: Diagnosis not present

## 2016-07-14 DIAGNOSIS — M6281 Muscle weakness (generalized): Secondary | ICD-10-CM | POA: Diagnosis not present

## 2016-07-15 DIAGNOSIS — Z9181 History of falling: Secondary | ICD-10-CM | POA: Diagnosis not present

## 2016-07-15 DIAGNOSIS — M6281 Muscle weakness (generalized): Secondary | ICD-10-CM | POA: Diagnosis not present

## 2016-07-15 DIAGNOSIS — R41841 Cognitive communication deficit: Secondary | ICD-10-CM | POA: Diagnosis not present

## 2016-07-15 NOTE — Telephone Encounter (Signed)
LM x 1 

## 2016-07-20 DIAGNOSIS — Z9181 History of falling: Secondary | ICD-10-CM | POA: Diagnosis not present

## 2016-07-20 DIAGNOSIS — R41841 Cognitive communication deficit: Secondary | ICD-10-CM | POA: Diagnosis not present

## 2016-07-20 DIAGNOSIS — M6281 Muscle weakness (generalized): Secondary | ICD-10-CM | POA: Diagnosis not present

## 2016-07-21 ENCOUNTER — Other Ambulatory Visit: Payer: Self-pay | Admitting: Family Medicine

## 2016-07-21 DIAGNOSIS — M6281 Muscle weakness (generalized): Secondary | ICD-10-CM | POA: Diagnosis not present

## 2016-07-21 DIAGNOSIS — N179 Acute kidney failure, unspecified: Secondary | ICD-10-CM

## 2016-07-21 DIAGNOSIS — Z9181 History of falling: Secondary | ICD-10-CM | POA: Diagnosis not present

## 2016-07-21 DIAGNOSIS — R41841 Cognitive communication deficit: Secondary | ICD-10-CM | POA: Diagnosis not present

## 2016-07-22 DIAGNOSIS — M6281 Muscle weakness (generalized): Secondary | ICD-10-CM | POA: Diagnosis not present

## 2016-07-22 DIAGNOSIS — Z9181 History of falling: Secondary | ICD-10-CM | POA: Diagnosis not present

## 2016-07-22 DIAGNOSIS — R41841 Cognitive communication deficit: Secondary | ICD-10-CM | POA: Diagnosis not present

## 2016-07-25 NOTE — Telephone Encounter (Signed)
Left message on patient voicemail to call back for medical results.

## 2016-07-25 NOTE — Telephone Encounter (Signed)
LM x 2

## 2016-07-26 ENCOUNTER — Ambulatory Visit
Admission: RE | Admit: 2016-07-26 | Discharge: 2016-07-26 | Disposition: A | Discharge status: Home / Self Care | Payer: Medicare HMO | Source: Ambulatory Visit | Attending: Family Medicine | Admitting: Family Medicine

## 2016-07-26 DIAGNOSIS — R69 Illness, unspecified: Secondary | ICD-10-CM | POA: Diagnosis not present

## 2016-07-26 DIAGNOSIS — M6281 Muscle weakness (generalized): Secondary | ICD-10-CM | POA: Diagnosis not present

## 2016-07-26 DIAGNOSIS — R41841 Cognitive communication deficit: Secondary | ICD-10-CM | POA: Diagnosis not present

## 2016-07-26 DIAGNOSIS — S37009A Unspecified injury of unspecified kidney, initial encounter: Secondary | ICD-10-CM

## 2016-07-26 DIAGNOSIS — Z9181 History of falling: Secondary | ICD-10-CM | POA: Diagnosis not present

## 2016-07-26 DIAGNOSIS — N179 Acute kidney failure, unspecified: Secondary | ICD-10-CM | POA: Diagnosis not present

## 2016-07-26 MED ORDER — IOPAMIDOL (ISOVUE-300) INJECTION 61%
100.0000 mL | Freq: Once | INTRAVENOUS | Status: AC | PRN
  Administered 2016-07-26: 100 mL via INTRAVENOUS

## 2016-07-27 DIAGNOSIS — Z9181 History of falling: Secondary | ICD-10-CM | POA: Diagnosis not present

## 2016-07-27 DIAGNOSIS — R41841 Cognitive communication deficit: Secondary | ICD-10-CM | POA: Diagnosis not present

## 2016-07-27 DIAGNOSIS — M6281 Muscle weakness (generalized): Secondary | ICD-10-CM | POA: Diagnosis not present

## 2016-07-27 NOTE — Telephone Encounter (Signed)
Left message for patient to contact office for medical results.

## 2016-07-28 DIAGNOSIS — M6281 Muscle weakness (generalized): Secondary | ICD-10-CM | POA: Diagnosis not present

## 2016-07-28 DIAGNOSIS — Z9181 History of falling: Secondary | ICD-10-CM | POA: Diagnosis not present

## 2016-07-28 DIAGNOSIS — R41841 Cognitive communication deficit: Secondary | ICD-10-CM | POA: Diagnosis not present

## 2016-07-29 DIAGNOSIS — Z9181 History of falling: Secondary | ICD-10-CM | POA: Diagnosis not present

## 2016-07-29 DIAGNOSIS — R41841 Cognitive communication deficit: Secondary | ICD-10-CM | POA: Diagnosis not present

## 2016-07-29 DIAGNOSIS — M6281 Muscle weakness (generalized): Secondary | ICD-10-CM | POA: Diagnosis not present

## 2016-08-01 NOTE — Telephone Encounter (Signed)
Left message for patient to call office for results. °

## 2016-08-01 NOTE — Telephone Encounter (Signed)
A letter asking pt to contact office for medical results is being put in outgoing mail using address on file.

## 2016-08-02 DIAGNOSIS — M6281 Muscle weakness (generalized): Secondary | ICD-10-CM | POA: Diagnosis not present

## 2016-08-02 DIAGNOSIS — R41841 Cognitive communication deficit: Secondary | ICD-10-CM | POA: Diagnosis not present

## 2016-08-02 DIAGNOSIS — Z9181 History of falling: Secondary | ICD-10-CM | POA: Diagnosis not present

## 2016-08-02 NOTE — Telephone Encounter (Signed)
Will close encounter as patient has been unable to be notified and letter mailed.

## 2016-08-03 DIAGNOSIS — R41841 Cognitive communication deficit: Secondary | ICD-10-CM | POA: Diagnosis not present

## 2016-08-03 DIAGNOSIS — M6281 Muscle weakness (generalized): Secondary | ICD-10-CM | POA: Diagnosis not present

## 2016-08-03 DIAGNOSIS — Z9181 History of falling: Secondary | ICD-10-CM | POA: Diagnosis not present

## 2016-08-04 DIAGNOSIS — M6281 Muscle weakness (generalized): Secondary | ICD-10-CM | POA: Diagnosis not present

## 2016-08-04 DIAGNOSIS — R41841 Cognitive communication deficit: Secondary | ICD-10-CM | POA: Diagnosis not present

## 2016-08-04 DIAGNOSIS — Z9181 History of falling: Secondary | ICD-10-CM | POA: Diagnosis not present

## 2016-08-05 ENCOUNTER — Telehealth: Payer: Self-pay | Admitting: Pulmonary Disease

## 2016-08-05 DIAGNOSIS — M6281 Muscle weakness (generalized): Secondary | ICD-10-CM | POA: Diagnosis not present

## 2016-08-05 DIAGNOSIS — R41841 Cognitive communication deficit: Secondary | ICD-10-CM | POA: Diagnosis not present

## 2016-08-05 DIAGNOSIS — Z9181 History of falling: Secondary | ICD-10-CM | POA: Diagnosis not present

## 2016-08-05 NOTE — Telephone Encounter (Signed)
Called and spoke with Shelley [name] and she is aware of lab results per VS.  Nothing further is needed.

## 2016-08-07 DIAGNOSIS — G4733 Obstructive sleep apnea (adult) (pediatric): Secondary | ICD-10-CM | POA: Diagnosis not present

## 2016-08-07 DIAGNOSIS — R0902 Hypoxemia: Secondary | ICD-10-CM | POA: Diagnosis not present

## 2016-08-07 DIAGNOSIS — J45909 Unspecified asthma, uncomplicated: Secondary | ICD-10-CM | POA: Diagnosis not present

## 2016-08-07 DIAGNOSIS — J9610 Chronic respiratory failure, unspecified whether with hypoxia or hypercapnia: Secondary | ICD-10-CM | POA: Diagnosis not present

## 2016-08-10 DIAGNOSIS — M6281 Muscle weakness (generalized): Secondary | ICD-10-CM | POA: Diagnosis not present

## 2016-08-10 DIAGNOSIS — R41841 Cognitive communication deficit: Secondary | ICD-10-CM | POA: Diagnosis not present

## 2016-08-10 DIAGNOSIS — Z9181 History of falling: Secondary | ICD-10-CM | POA: Diagnosis not present

## 2016-08-11 DIAGNOSIS — R41841 Cognitive communication deficit: Secondary | ICD-10-CM | POA: Diagnosis not present

## 2016-08-11 DIAGNOSIS — M6281 Muscle weakness (generalized): Secondary | ICD-10-CM | POA: Diagnosis not present

## 2016-08-11 DIAGNOSIS — Z9181 History of falling: Secondary | ICD-10-CM | POA: Diagnosis not present

## 2016-08-12 DIAGNOSIS — M6281 Muscle weakness (generalized): Secondary | ICD-10-CM | POA: Diagnosis not present

## 2016-08-12 DIAGNOSIS — R41841 Cognitive communication deficit: Secondary | ICD-10-CM | POA: Diagnosis not present

## 2016-08-12 DIAGNOSIS — Z9181 History of falling: Secondary | ICD-10-CM | POA: Diagnosis not present

## 2016-08-16 DIAGNOSIS — R41841 Cognitive communication deficit: Secondary | ICD-10-CM | POA: Diagnosis not present

## 2016-08-16 DIAGNOSIS — Z9181 History of falling: Secondary | ICD-10-CM | POA: Diagnosis not present

## 2016-08-16 DIAGNOSIS — M6281 Muscle weakness (generalized): Secondary | ICD-10-CM | POA: Diagnosis not present

## 2016-08-18 DIAGNOSIS — M6281 Muscle weakness (generalized): Secondary | ICD-10-CM | POA: Diagnosis not present

## 2016-08-18 DIAGNOSIS — Z9181 History of falling: Secondary | ICD-10-CM | POA: Diagnosis not present

## 2016-08-18 DIAGNOSIS — R41841 Cognitive communication deficit: Secondary | ICD-10-CM | POA: Diagnosis not present

## 2016-08-19 DIAGNOSIS — Z9181 History of falling: Secondary | ICD-10-CM | POA: Diagnosis not present

## 2016-08-19 DIAGNOSIS — M6281 Muscle weakness (generalized): Secondary | ICD-10-CM | POA: Diagnosis not present

## 2016-08-19 DIAGNOSIS — R41841 Cognitive communication deficit: Secondary | ICD-10-CM | POA: Diagnosis not present

## 2016-08-22 DIAGNOSIS — Z9181 History of falling: Secondary | ICD-10-CM | POA: Diagnosis not present

## 2016-08-22 DIAGNOSIS — R41841 Cognitive communication deficit: Secondary | ICD-10-CM | POA: Diagnosis not present

## 2016-08-22 DIAGNOSIS — M6281 Muscle weakness (generalized): Secondary | ICD-10-CM | POA: Diagnosis not present

## 2016-08-23 DIAGNOSIS — R41841 Cognitive communication deficit: Secondary | ICD-10-CM | POA: Diagnosis not present

## 2016-08-23 DIAGNOSIS — Z9181 History of falling: Secondary | ICD-10-CM | POA: Diagnosis not present

## 2016-08-23 DIAGNOSIS — M6281 Muscle weakness (generalized): Secondary | ICD-10-CM | POA: Diagnosis not present

## 2016-08-30 DIAGNOSIS — R69 Illness, unspecified: Secondary | ICD-10-CM | POA: Diagnosis not present

## 2016-08-31 DIAGNOSIS — R41841 Cognitive communication deficit: Secondary | ICD-10-CM | POA: Diagnosis not present

## 2016-08-31 DIAGNOSIS — Z9181 History of falling: Secondary | ICD-10-CM | POA: Diagnosis not present

## 2016-08-31 DIAGNOSIS — M6281 Muscle weakness (generalized): Secondary | ICD-10-CM | POA: Diagnosis not present

## 2016-09-01 DIAGNOSIS — R41841 Cognitive communication deficit: Secondary | ICD-10-CM | POA: Diagnosis not present

## 2016-09-01 DIAGNOSIS — M6281 Muscle weakness (generalized): Secondary | ICD-10-CM | POA: Diagnosis not present

## 2016-09-01 DIAGNOSIS — Z9181 History of falling: Secondary | ICD-10-CM | POA: Diagnosis not present

## 2016-09-04 DIAGNOSIS — J45909 Unspecified asthma, uncomplicated: Secondary | ICD-10-CM | POA: Diagnosis not present

## 2016-09-04 DIAGNOSIS — R0902 Hypoxemia: Secondary | ICD-10-CM | POA: Diagnosis not present

## 2016-09-04 DIAGNOSIS — G4733 Obstructive sleep apnea (adult) (pediatric): Secondary | ICD-10-CM | POA: Diagnosis not present

## 2016-09-04 DIAGNOSIS — J9610 Chronic respiratory failure, unspecified whether with hypoxia or hypercapnia: Secondary | ICD-10-CM | POA: Diagnosis not present

## 2016-09-08 DIAGNOSIS — Z9181 History of falling: Secondary | ICD-10-CM | POA: Diagnosis not present

## 2016-09-08 DIAGNOSIS — R41841 Cognitive communication deficit: Secondary | ICD-10-CM | POA: Diagnosis not present

## 2016-09-08 DIAGNOSIS — M6281 Muscle weakness (generalized): Secondary | ICD-10-CM | POA: Diagnosis not present

## 2016-09-09 DIAGNOSIS — R41841 Cognitive communication deficit: Secondary | ICD-10-CM | POA: Diagnosis not present

## 2016-09-09 DIAGNOSIS — M6281 Muscle weakness (generalized): Secondary | ICD-10-CM | POA: Diagnosis not present

## 2016-09-09 DIAGNOSIS — Z9181 History of falling: Secondary | ICD-10-CM | POA: Diagnosis not present

## 2016-09-12 DIAGNOSIS — R41841 Cognitive communication deficit: Secondary | ICD-10-CM | POA: Diagnosis not present

## 2016-09-14 DIAGNOSIS — R41841 Cognitive communication deficit: Secondary | ICD-10-CM | POA: Diagnosis not present

## 2016-09-19 DIAGNOSIS — R41841 Cognitive communication deficit: Secondary | ICD-10-CM | POA: Diagnosis not present

## 2016-09-23 DIAGNOSIS — R41841 Cognitive communication deficit: Secondary | ICD-10-CM | POA: Diagnosis not present

## 2016-09-26 DIAGNOSIS — R41841 Cognitive communication deficit: Secondary | ICD-10-CM | POA: Diagnosis not present

## 2016-09-27 DIAGNOSIS — R41841 Cognitive communication deficit: Secondary | ICD-10-CM | POA: Diagnosis not present

## 2016-09-28 DIAGNOSIS — R41841 Cognitive communication deficit: Secondary | ICD-10-CM | POA: Diagnosis not present

## 2016-10-05 DIAGNOSIS — G4733 Obstructive sleep apnea (adult) (pediatric): Secondary | ICD-10-CM | POA: Diagnosis not present

## 2016-10-05 DIAGNOSIS — R0902 Hypoxemia: Secondary | ICD-10-CM | POA: Diagnosis not present

## 2016-10-05 DIAGNOSIS — J45909 Unspecified asthma, uncomplicated: Secondary | ICD-10-CM | POA: Diagnosis not present

## 2016-10-05 DIAGNOSIS — J9610 Chronic respiratory failure, unspecified whether with hypoxia or hypercapnia: Secondary | ICD-10-CM | POA: Diagnosis not present

## 2016-11-04 DIAGNOSIS — J45909 Unspecified asthma, uncomplicated: Secondary | ICD-10-CM | POA: Diagnosis not present

## 2016-11-04 DIAGNOSIS — G4733 Obstructive sleep apnea (adult) (pediatric): Secondary | ICD-10-CM | POA: Diagnosis not present

## 2016-11-04 DIAGNOSIS — R0902 Hypoxemia: Secondary | ICD-10-CM | POA: Diagnosis not present

## 2016-11-04 DIAGNOSIS — J9610 Chronic respiratory failure, unspecified whether with hypoxia or hypercapnia: Secondary | ICD-10-CM | POA: Diagnosis not present

## 2016-12-05 DIAGNOSIS — G4733 Obstructive sleep apnea (adult) (pediatric): Secondary | ICD-10-CM | POA: Diagnosis not present

## 2016-12-05 DIAGNOSIS — J45909 Unspecified asthma, uncomplicated: Secondary | ICD-10-CM | POA: Diagnosis not present

## 2016-12-05 DIAGNOSIS — R0902 Hypoxemia: Secondary | ICD-10-CM | POA: Diagnosis not present

## 2016-12-05 DIAGNOSIS — J9610 Chronic respiratory failure, unspecified whether with hypoxia or hypercapnia: Secondary | ICD-10-CM | POA: Diagnosis not present

## 2016-12-06 ENCOUNTER — Encounter: Payer: Self-pay | Admitting: Pulmonary Disease

## 2016-12-06 ENCOUNTER — Ambulatory Visit (INDEPENDENT_AMBULATORY_CARE_PROVIDER_SITE_OTHER): Payer: Medicare HMO | Admitting: Pulmonary Disease

## 2016-12-06 VITALS — BP 124/68 | HR 111 | Ht 67.0 in | Wt 258.6 lb

## 2016-12-06 DIAGNOSIS — G4733 Obstructive sleep apnea (adult) (pediatric): Secondary | ICD-10-CM | POA: Diagnosis not present

## 2016-12-06 DIAGNOSIS — J9610 Chronic respiratory failure, unspecified whether with hypoxia or hypercapnia: Secondary | ICD-10-CM | POA: Diagnosis not present

## 2016-12-06 DIAGNOSIS — E662 Morbid (severe) obesity with alveolar hypoventilation: Secondary | ICD-10-CM

## 2016-12-06 DIAGNOSIS — J45998 Other asthma: Secondary | ICD-10-CM

## 2016-12-06 NOTE — Progress Notes (Signed)
Current Outpatient Prescriptions on File Prior to Visit  Medication Sig  . albuterol (PROVENTIL) (2.5 MG/3ML) 0.083% nebulizer solution Take 3 mLs (2.5 mg total) by nebulization every 6 (six) hours as needed for wheezing or shortness of breath.  Marland Kitchen atorvastatin (LIPITOR) 10 MG tablet Take 10 mg by mouth daily.  . budesonide (PULMICORT) 0.25 MG/2ML nebulizer solution Take 0.25 mg by nebulization 2 (two) times daily.   . famotidine (PEPCID) 20 MG tablet Take 1 tablet (20 mg total) by mouth at bedtime.  Marland Kitchen FERREX 150 150 MG capsule Take 150 mg by mouth daily.  . hydrocortisone-pramoxine (ANALPRAM-HC) 2.5-1 % rectal cream Place 1 application rectally 4 (four) times daily.  Marland Kitchen ipratropium-albuterol (DUONEB) 0.5-2.5 (3) MG/3ML SOLN Take 3 mLs by nebulization every 6 (six) hours as needed (SOB/WHEEZING).  Marland Kitchen loratadine (CLARITIN) 10 MG tablet Take 10 mg by mouth daily as needed for allergies.   Marland Kitchen losartan-hydrochlorothiazide (HYZAAR) 100-12.5 MG tablet Take 1 tablet by mouth daily.   . metFORMIN (GLUCOPHAGE) 500 MG tablet Take 1,000 mg by mouth every evening.   . montelukast (SINGULAIR) 10 MG tablet Take 10 mg by mouth at bedtime.  Marland Kitchen nystatin (MYCOSTATIN/NYSTOP) powder Apply topically 3 (three) times daily.  Marland Kitchen omeprazole (PRILOSEC) 20 MG capsule Take 40 mg by mouth daily.  Marland Kitchen omeprazole (PRILOSEC) 40 MG capsule Take 40 mg by mouth daily.   Marland Kitchen oxyCODONE (OXY IR/ROXICODONE) 5 MG immediate release tablet Take 1-3 tablets (5-15 mg total) by mouth every 4 (four) hours as needed for moderate pain, severe pain or breakthrough pain.  Marland Kitchen sertraline (ZOLOFT) 50 MG tablet Take 100 mg by mouth daily.    No current facility-administered medications on file prior to visit.     Chief Complaint  Patient presents with  . Follow-up    Pt follow up for OSA with BiPAP. Pt wears 4 liters continous of O2 DME [facility]. Pt sleeping okay, not using the BIPAP for last three months. Pt feeling okay with o2 and BIPAP.     Sleep  tests PSG 09/13/10>>AHI 98.6, SpO2 60% ONO with CPAP and 3 liters 01/13/12>>Test time 7 hrs 6 min. Mean SpO2 89%, low SpO2 62%. Spent 2 hrs 34 min with SpO2 < 88% CPAP 12/16/11 to 01/14/12>>Used on 6 of 30 nights with average 7 hrs 3 min. Average AHI 1.8 with CPAP 10 cm H2O. ONO with CPAP and 3 liters 01/13/12 >> Test time 7 hrs 6 min.  Mean SpO2 89%, low SpO2 62%.  Spent 2 hrs 34 min with SpO2 < 88%. ONO with CPAP and 4 liters 01/31/12 >> Test time 7 hrs 36 min.  Mean SpO2 91.6%, low SpO2 73%.  Spent 56 min with SpO2 < 88%. BPAP 02/20/12>>17/11 cm H2O with 4 liters oxygen.  Past medical history DM, HTN, HLD, Anxiety, GERD, HH, Developmental delay  Past surgical history, Family history, Social history, Allergies reviewed  Vital signs BP 124/68 (BP Location: Right Arm, Cuff Size: Normal)   Pulse (!) 111   Ht 5\' 7"  (1.702 m)   Wt 258 lb 9.6 oz (117.3 kg)   SpO2 98%   BMI 40.50 kg/m   History of Present Illness: Shelley [name] is a 51 y.o. female with OSA, OHS, and asthma.  She forgets to use Bipap.  She does sleep better when she uses machine.  She is using 4 liters oxygen.  She has gained weight.  She is not having much cough, wheeze, or sputum.  She has trouble with her breathing  in the hot weather.  Physical Exam:  General - pleasant, wearing oxygen Eyes - pupils reactive ENT - no sinus tenderness, no oral exudate, no LAN Cardiac - regular, no murmur Chest - no wheeze, rales Abd - soft, non tender Ext - no edema Skin - no rashes Neuro - normal strength Psych - normal mood  Assessment/Plan:  Obstructive sleep apnea. - she reports benefit from using Bipap - discussed techniques to help assist with compliance  Chronic respiratory failure with hypoxia from OHS. - continue 4 liters oxygen with Bipap - continue 4 liters oxygen during the day, and she can monitor her pulse ox to keep SpO2 > 90%  Persistent asthma. - continue pulmicort daily and singulair - prn  albuterol  Morbid obesity. - discussed importance of weight loss  Goals of care. - DNR/DNI   Patient Instructions  Follow up in 1 year   Coralyn HellingVineet Ostin Mathey, MD [facility]/Sleep Pager:  [phone] 12/06/2016, 2:16 PM

## 2016-12-06 NOTE — Patient Instructions (Signed)
Follow up in 1 year.

## 2017-01-04 DIAGNOSIS — J9610 Chronic respiratory failure, unspecified whether with hypoxia or hypercapnia: Secondary | ICD-10-CM | POA: Diagnosis not present

## 2017-01-04 DIAGNOSIS — G4733 Obstructive sleep apnea (adult) (pediatric): Secondary | ICD-10-CM | POA: Diagnosis not present

## 2017-01-04 DIAGNOSIS — R0902 Hypoxemia: Secondary | ICD-10-CM | POA: Diagnosis not present

## 2017-01-04 DIAGNOSIS — J45909 Unspecified asthma, uncomplicated: Secondary | ICD-10-CM | POA: Diagnosis not present

## 2017-01-17 DIAGNOSIS — I10 Essential (primary) hypertension: Secondary | ICD-10-CM | POA: Diagnosis not present

## 2017-01-17 DIAGNOSIS — G4733 Obstructive sleep apnea (adult) (pediatric): Secondary | ICD-10-CM | POA: Diagnosis not present

## 2017-01-17 DIAGNOSIS — Z0000 Encounter for general adult medical examination without abnormal findings: Secondary | ICD-10-CM | POA: Diagnosis not present

## 2017-01-17 DIAGNOSIS — E782 Mixed hyperlipidemia: Secondary | ICD-10-CM | POA: Diagnosis not present

## 2017-01-17 DIAGNOSIS — J4520 Mild intermittent asthma, uncomplicated: Secondary | ICD-10-CM | POA: Diagnosis not present

## 2017-01-17 DIAGNOSIS — D509 Iron deficiency anemia, unspecified: Secondary | ICD-10-CM | POA: Diagnosis not present

## 2017-01-17 DIAGNOSIS — E1165 Type 2 diabetes mellitus with hyperglycemia: Secondary | ICD-10-CM | POA: Diagnosis not present

## 2017-01-17 DIAGNOSIS — J9611 Chronic respiratory failure with hypoxia: Secondary | ICD-10-CM | POA: Diagnosis not present

## 2017-01-17 DIAGNOSIS — K219 Gastro-esophageal reflux disease without esophagitis: Secondary | ICD-10-CM | POA: Diagnosis not present

## 2017-01-17 DIAGNOSIS — R69 Illness, unspecified: Secondary | ICD-10-CM | POA: Diagnosis not present

## 2017-01-17 DIAGNOSIS — E119 Type 2 diabetes mellitus without complications: Secondary | ICD-10-CM | POA: Diagnosis not present

## 2017-01-18 DIAGNOSIS — R278 Other lack of coordination: Secondary | ICD-10-CM | POA: Diagnosis not present

## 2017-01-18 DIAGNOSIS — R488 Other symbolic dysfunctions: Secondary | ICD-10-CM | POA: Diagnosis not present

## 2017-01-18 DIAGNOSIS — R2689 Other abnormalities of gait and mobility: Secondary | ICD-10-CM | POA: Diagnosis not present

## 2017-01-18 DIAGNOSIS — R2681 Unsteadiness on feet: Secondary | ICD-10-CM | POA: Diagnosis not present

## 2017-01-18 DIAGNOSIS — M6281 Muscle weakness (generalized): Secondary | ICD-10-CM | POA: Diagnosis not present

## 2017-01-18 DIAGNOSIS — N393 Stress incontinence (female) (male): Secondary | ICD-10-CM | POA: Diagnosis not present

## 2017-01-25 DIAGNOSIS — M6281 Muscle weakness (generalized): Secondary | ICD-10-CM | POA: Diagnosis not present

## 2017-01-25 DIAGNOSIS — R488 Other symbolic dysfunctions: Secondary | ICD-10-CM | POA: Diagnosis not present

## 2017-01-25 DIAGNOSIS — R278 Other lack of coordination: Secondary | ICD-10-CM | POA: Diagnosis not present

## 2017-01-25 DIAGNOSIS — R2681 Unsteadiness on feet: Secondary | ICD-10-CM | POA: Diagnosis not present

## 2017-01-25 DIAGNOSIS — R2689 Other abnormalities of gait and mobility: Secondary | ICD-10-CM | POA: Diagnosis not present

## 2017-01-25 DIAGNOSIS — N393 Stress incontinence (female) (male): Secondary | ICD-10-CM | POA: Diagnosis not present

## 2017-01-26 DIAGNOSIS — R278 Other lack of coordination: Secondary | ICD-10-CM | POA: Diagnosis not present

## 2017-01-26 DIAGNOSIS — R488 Other symbolic dysfunctions: Secondary | ICD-10-CM | POA: Diagnosis not present

## 2017-01-26 DIAGNOSIS — N393 Stress incontinence (female) (male): Secondary | ICD-10-CM | POA: Diagnosis not present

## 2017-01-26 DIAGNOSIS — R2689 Other abnormalities of gait and mobility: Secondary | ICD-10-CM | POA: Diagnosis not present

## 2017-01-26 DIAGNOSIS — R2681 Unsteadiness on feet: Secondary | ICD-10-CM | POA: Diagnosis not present

## 2017-01-26 DIAGNOSIS — M6281 Muscle weakness (generalized): Secondary | ICD-10-CM | POA: Diagnosis not present

## 2017-01-30 DIAGNOSIS — N393 Stress incontinence (female) (male): Secondary | ICD-10-CM | POA: Diagnosis not present

## 2017-01-30 DIAGNOSIS — R278 Other lack of coordination: Secondary | ICD-10-CM | POA: Diagnosis not present

## 2017-01-30 DIAGNOSIS — R2681 Unsteadiness on feet: Secondary | ICD-10-CM | POA: Diagnosis not present

## 2017-01-30 DIAGNOSIS — M6281 Muscle weakness (generalized): Secondary | ICD-10-CM | POA: Diagnosis not present

## 2017-01-30 DIAGNOSIS — R2689 Other abnormalities of gait and mobility: Secondary | ICD-10-CM | POA: Diagnosis not present

## 2017-01-30 DIAGNOSIS — R488 Other symbolic dysfunctions: Secondary | ICD-10-CM | POA: Diagnosis not present

## 2017-01-31 DIAGNOSIS — R488 Other symbolic dysfunctions: Secondary | ICD-10-CM | POA: Diagnosis not present

## 2017-01-31 DIAGNOSIS — R278 Other lack of coordination: Secondary | ICD-10-CM | POA: Diagnosis not present

## 2017-01-31 DIAGNOSIS — M6281 Muscle weakness (generalized): Secondary | ICD-10-CM | POA: Diagnosis not present

## 2017-01-31 DIAGNOSIS — R2681 Unsteadiness on feet: Secondary | ICD-10-CM | POA: Diagnosis not present

## 2017-01-31 DIAGNOSIS — R2689 Other abnormalities of gait and mobility: Secondary | ICD-10-CM | POA: Diagnosis not present

## 2017-01-31 DIAGNOSIS — N393 Stress incontinence (female) (male): Secondary | ICD-10-CM | POA: Diagnosis not present

## 2017-02-04 DIAGNOSIS — J9610 Chronic respiratory failure, unspecified whether with hypoxia or hypercapnia: Secondary | ICD-10-CM | POA: Diagnosis not present

## 2017-02-04 DIAGNOSIS — R0902 Hypoxemia: Secondary | ICD-10-CM | POA: Diagnosis not present

## 2017-02-04 DIAGNOSIS — J45909 Unspecified asthma, uncomplicated: Secondary | ICD-10-CM | POA: Diagnosis not present

## 2017-02-04 DIAGNOSIS — G4733 Obstructive sleep apnea (adult) (pediatric): Secondary | ICD-10-CM | POA: Diagnosis not present

## 2017-02-06 DIAGNOSIS — N393 Stress incontinence (female) (male): Secondary | ICD-10-CM | POA: Diagnosis not present

## 2017-02-06 DIAGNOSIS — R278 Other lack of coordination: Secondary | ICD-10-CM | POA: Diagnosis not present

## 2017-02-06 DIAGNOSIS — M6281 Muscle weakness (generalized): Secondary | ICD-10-CM | POA: Diagnosis not present

## 2017-02-06 DIAGNOSIS — R488 Other symbolic dysfunctions: Secondary | ICD-10-CM | POA: Diagnosis not present

## 2017-02-06 DIAGNOSIS — R2681 Unsteadiness on feet: Secondary | ICD-10-CM | POA: Diagnosis not present

## 2017-02-06 DIAGNOSIS — R2689 Other abnormalities of gait and mobility: Secondary | ICD-10-CM | POA: Diagnosis not present

## 2017-02-08 DIAGNOSIS — R488 Other symbolic dysfunctions: Secondary | ICD-10-CM | POA: Diagnosis not present

## 2017-02-08 DIAGNOSIS — R2689 Other abnormalities of gait and mobility: Secondary | ICD-10-CM | POA: Diagnosis not present

## 2017-02-08 DIAGNOSIS — R278 Other lack of coordination: Secondary | ICD-10-CM | POA: Diagnosis not present

## 2017-02-08 DIAGNOSIS — M6281 Muscle weakness (generalized): Secondary | ICD-10-CM | POA: Diagnosis not present

## 2017-02-08 DIAGNOSIS — N393 Stress incontinence (female) (male): Secondary | ICD-10-CM | POA: Diagnosis not present

## 2017-02-08 DIAGNOSIS — R2681 Unsteadiness on feet: Secondary | ICD-10-CM | POA: Diagnosis not present

## 2017-02-14 DIAGNOSIS — R2681 Unsteadiness on feet: Secondary | ICD-10-CM | POA: Diagnosis not present

## 2017-02-14 DIAGNOSIS — N393 Stress incontinence (female) (male): Secondary | ICD-10-CM | POA: Diagnosis not present

## 2017-02-14 DIAGNOSIS — M6281 Muscle weakness (generalized): Secondary | ICD-10-CM | POA: Diagnosis not present

## 2017-02-14 DIAGNOSIS — R488 Other symbolic dysfunctions: Secondary | ICD-10-CM | POA: Diagnosis not present

## 2017-02-14 DIAGNOSIS — R278 Other lack of coordination: Secondary | ICD-10-CM | POA: Diagnosis not present

## 2017-02-14 DIAGNOSIS — R2689 Other abnormalities of gait and mobility: Secondary | ICD-10-CM | POA: Diagnosis not present

## 2017-02-21 DIAGNOSIS — R2689 Other abnormalities of gait and mobility: Secondary | ICD-10-CM | POA: Diagnosis not present

## 2017-02-21 DIAGNOSIS — N393 Stress incontinence (female) (male): Secondary | ICD-10-CM | POA: Diagnosis not present

## 2017-02-21 DIAGNOSIS — M6281 Muscle weakness (generalized): Secondary | ICD-10-CM | POA: Diagnosis not present

## 2017-02-21 DIAGNOSIS — R278 Other lack of coordination: Secondary | ICD-10-CM | POA: Diagnosis not present

## 2017-02-21 DIAGNOSIS — R2681 Unsteadiness on feet: Secondary | ICD-10-CM | POA: Diagnosis not present

## 2017-02-21 DIAGNOSIS — R488 Other symbolic dysfunctions: Secondary | ICD-10-CM | POA: Diagnosis not present

## 2017-02-22 DIAGNOSIS — R2689 Other abnormalities of gait and mobility: Secondary | ICD-10-CM | POA: Diagnosis not present

## 2017-02-22 DIAGNOSIS — M6281 Muscle weakness (generalized): Secondary | ICD-10-CM | POA: Diagnosis not present

## 2017-02-22 DIAGNOSIS — R278 Other lack of coordination: Secondary | ICD-10-CM | POA: Diagnosis not present

## 2017-02-22 DIAGNOSIS — R488 Other symbolic dysfunctions: Secondary | ICD-10-CM | POA: Diagnosis not present

## 2017-02-22 DIAGNOSIS — R2681 Unsteadiness on feet: Secondary | ICD-10-CM | POA: Diagnosis not present

## 2017-02-22 DIAGNOSIS — N393 Stress incontinence (female) (male): Secondary | ICD-10-CM | POA: Diagnosis not present

## 2017-02-23 DIAGNOSIS — R2681 Unsteadiness on feet: Secondary | ICD-10-CM | POA: Diagnosis not present

## 2017-02-23 DIAGNOSIS — M6281 Muscle weakness (generalized): Secondary | ICD-10-CM | POA: Diagnosis not present

## 2017-02-23 DIAGNOSIS — N393 Stress incontinence (female) (male): Secondary | ICD-10-CM | POA: Diagnosis not present

## 2017-02-23 DIAGNOSIS — R2689 Other abnormalities of gait and mobility: Secondary | ICD-10-CM | POA: Diagnosis not present

## 2017-02-23 DIAGNOSIS — R278 Other lack of coordination: Secondary | ICD-10-CM | POA: Diagnosis not present

## 2017-02-23 DIAGNOSIS — R488 Other symbolic dysfunctions: Secondary | ICD-10-CM | POA: Diagnosis not present

## 2017-02-24 DIAGNOSIS — R278 Other lack of coordination: Secondary | ICD-10-CM | POA: Diagnosis not present

## 2017-02-24 DIAGNOSIS — M6281 Muscle weakness (generalized): Secondary | ICD-10-CM | POA: Diagnosis not present

## 2017-02-24 DIAGNOSIS — R2689 Other abnormalities of gait and mobility: Secondary | ICD-10-CM | POA: Diagnosis not present

## 2017-02-24 DIAGNOSIS — N393 Stress incontinence (female) (male): Secondary | ICD-10-CM | POA: Diagnosis not present

## 2017-02-24 DIAGNOSIS — R488 Other symbolic dysfunctions: Secondary | ICD-10-CM | POA: Diagnosis not present

## 2017-02-24 DIAGNOSIS — R2681 Unsteadiness on feet: Secondary | ICD-10-CM | POA: Diagnosis not present

## 2017-02-27 DIAGNOSIS — R2681 Unsteadiness on feet: Secondary | ICD-10-CM | POA: Diagnosis not present

## 2017-02-27 DIAGNOSIS — M6281 Muscle weakness (generalized): Secondary | ICD-10-CM | POA: Diagnosis not present

## 2017-02-27 DIAGNOSIS — R2689 Other abnormalities of gait and mobility: Secondary | ICD-10-CM | POA: Diagnosis not present

## 2017-02-27 DIAGNOSIS — R278 Other lack of coordination: Secondary | ICD-10-CM | POA: Diagnosis not present

## 2017-02-27 DIAGNOSIS — N393 Stress incontinence (female) (male): Secondary | ICD-10-CM | POA: Diagnosis not present

## 2017-02-27 DIAGNOSIS — R488 Other symbolic dysfunctions: Secondary | ICD-10-CM | POA: Diagnosis not present

## 2017-02-28 DIAGNOSIS — R278 Other lack of coordination: Secondary | ICD-10-CM | POA: Diagnosis not present

## 2017-02-28 DIAGNOSIS — N393 Stress incontinence (female) (male): Secondary | ICD-10-CM | POA: Diagnosis not present

## 2017-02-28 DIAGNOSIS — R488 Other symbolic dysfunctions: Secondary | ICD-10-CM | POA: Diagnosis not present

## 2017-02-28 DIAGNOSIS — M6281 Muscle weakness (generalized): Secondary | ICD-10-CM | POA: Diagnosis not present

## 2017-02-28 DIAGNOSIS — R2689 Other abnormalities of gait and mobility: Secondary | ICD-10-CM | POA: Diagnosis not present

## 2017-02-28 DIAGNOSIS — R2681 Unsteadiness on feet: Secondary | ICD-10-CM | POA: Diagnosis not present

## 2017-03-03 DIAGNOSIS — R2681 Unsteadiness on feet: Secondary | ICD-10-CM | POA: Diagnosis not present

## 2017-03-03 DIAGNOSIS — R278 Other lack of coordination: Secondary | ICD-10-CM | POA: Diagnosis not present

## 2017-03-03 DIAGNOSIS — M6281 Muscle weakness (generalized): Secondary | ICD-10-CM | POA: Diagnosis not present

## 2017-03-03 DIAGNOSIS — N393 Stress incontinence (female) (male): Secondary | ICD-10-CM | POA: Diagnosis not present

## 2017-03-03 DIAGNOSIS — R488 Other symbolic dysfunctions: Secondary | ICD-10-CM | POA: Diagnosis not present

## 2017-03-03 DIAGNOSIS — R2689 Other abnormalities of gait and mobility: Secondary | ICD-10-CM | POA: Diagnosis not present

## 2017-03-06 DIAGNOSIS — R278 Other lack of coordination: Secondary | ICD-10-CM | POA: Diagnosis not present

## 2017-03-06 DIAGNOSIS — M6281 Muscle weakness (generalized): Secondary | ICD-10-CM | POA: Diagnosis not present

## 2017-03-06 DIAGNOSIS — R2689 Other abnormalities of gait and mobility: Secondary | ICD-10-CM | POA: Diagnosis not present

## 2017-03-06 DIAGNOSIS — N393 Stress incontinence (female) (male): Secondary | ICD-10-CM | POA: Diagnosis not present

## 2017-03-06 DIAGNOSIS — R488 Other symbolic dysfunctions: Secondary | ICD-10-CM | POA: Diagnosis not present

## 2017-03-06 DIAGNOSIS — R2681 Unsteadiness on feet: Secondary | ICD-10-CM | POA: Diagnosis not present

## 2017-03-07 DIAGNOSIS — J9610 Chronic respiratory failure, unspecified whether with hypoxia or hypercapnia: Secondary | ICD-10-CM | POA: Diagnosis not present

## 2017-03-07 DIAGNOSIS — G4733 Obstructive sleep apnea (adult) (pediatric): Secondary | ICD-10-CM | POA: Diagnosis not present

## 2017-03-07 DIAGNOSIS — J45909 Unspecified asthma, uncomplicated: Secondary | ICD-10-CM | POA: Diagnosis not present

## 2017-03-07 DIAGNOSIS — R0902 Hypoxemia: Secondary | ICD-10-CM | POA: Diagnosis not present

## 2017-03-08 DIAGNOSIS — R2681 Unsteadiness on feet: Secondary | ICD-10-CM | POA: Diagnosis not present

## 2017-03-08 DIAGNOSIS — R488 Other symbolic dysfunctions: Secondary | ICD-10-CM | POA: Diagnosis not present

## 2017-03-08 DIAGNOSIS — R278 Other lack of coordination: Secondary | ICD-10-CM | POA: Diagnosis not present

## 2017-03-08 DIAGNOSIS — R2689 Other abnormalities of gait and mobility: Secondary | ICD-10-CM | POA: Diagnosis not present

## 2017-03-08 DIAGNOSIS — E876 Hypokalemia: Secondary | ICD-10-CM | POA: Diagnosis not present

## 2017-03-08 DIAGNOSIS — M6281 Muscle weakness (generalized): Secondary | ICD-10-CM | POA: Diagnosis not present

## 2017-03-08 DIAGNOSIS — N393 Stress incontinence (female) (male): Secondary | ICD-10-CM | POA: Diagnosis not present

## 2017-03-09 DIAGNOSIS — Z1231 Encounter for screening mammogram for malignant neoplasm of breast: Secondary | ICD-10-CM | POA: Diagnosis not present

## 2017-03-09 DIAGNOSIS — Z803 Family history of malignant neoplasm of breast: Secondary | ICD-10-CM | POA: Diagnosis not present

## 2017-03-10 DIAGNOSIS — R278 Other lack of coordination: Secondary | ICD-10-CM | POA: Diagnosis not present

## 2017-03-10 DIAGNOSIS — R2689 Other abnormalities of gait and mobility: Secondary | ICD-10-CM | POA: Diagnosis not present

## 2017-03-10 DIAGNOSIS — R2681 Unsteadiness on feet: Secondary | ICD-10-CM | POA: Diagnosis not present

## 2017-03-10 DIAGNOSIS — M6281 Muscle weakness (generalized): Secondary | ICD-10-CM | POA: Diagnosis not present

## 2017-03-10 DIAGNOSIS — N393 Stress incontinence (female) (male): Secondary | ICD-10-CM | POA: Diagnosis not present

## 2017-03-10 DIAGNOSIS — R488 Other symbolic dysfunctions: Secondary | ICD-10-CM | POA: Diagnosis not present

## 2017-03-13 DIAGNOSIS — R2689 Other abnormalities of gait and mobility: Secondary | ICD-10-CM | POA: Diagnosis not present

## 2017-03-13 DIAGNOSIS — R488 Other symbolic dysfunctions: Secondary | ICD-10-CM | POA: Diagnosis not present

## 2017-03-13 DIAGNOSIS — R278 Other lack of coordination: Secondary | ICD-10-CM | POA: Diagnosis not present

## 2017-03-13 DIAGNOSIS — R2681 Unsteadiness on feet: Secondary | ICD-10-CM | POA: Diagnosis not present

## 2017-03-13 DIAGNOSIS — N393 Stress incontinence (female) (male): Secondary | ICD-10-CM | POA: Diagnosis not present

## 2017-03-13 DIAGNOSIS — M6281 Muscle weakness (generalized): Secondary | ICD-10-CM | POA: Diagnosis not present

## 2017-03-14 DIAGNOSIS — M6281 Muscle weakness (generalized): Secondary | ICD-10-CM | POA: Diagnosis not present

## 2017-03-14 DIAGNOSIS — R2681 Unsteadiness on feet: Secondary | ICD-10-CM | POA: Diagnosis not present

## 2017-03-14 DIAGNOSIS — N393 Stress incontinence (female) (male): Secondary | ICD-10-CM | POA: Diagnosis not present

## 2017-03-14 DIAGNOSIS — R2689 Other abnormalities of gait and mobility: Secondary | ICD-10-CM | POA: Diagnosis not present

## 2017-03-14 DIAGNOSIS — R488 Other symbolic dysfunctions: Secondary | ICD-10-CM | POA: Diagnosis not present

## 2017-03-14 DIAGNOSIS — R278 Other lack of coordination: Secondary | ICD-10-CM | POA: Diagnosis not present

## 2017-04-06 DIAGNOSIS — R0902 Hypoxemia: Secondary | ICD-10-CM | POA: Diagnosis not present

## 2017-04-06 DIAGNOSIS — G4733 Obstructive sleep apnea (adult) (pediatric): Secondary | ICD-10-CM | POA: Diagnosis not present

## 2017-04-06 DIAGNOSIS — J9610 Chronic respiratory failure, unspecified whether with hypoxia or hypercapnia: Secondary | ICD-10-CM | POA: Diagnosis not present

## 2017-04-06 DIAGNOSIS — J45909 Unspecified asthma, uncomplicated: Secondary | ICD-10-CM | POA: Diagnosis not present

## 2017-05-07 DIAGNOSIS — G4733 Obstructive sleep apnea (adult) (pediatric): Secondary | ICD-10-CM | POA: Diagnosis not present

## 2017-05-07 DIAGNOSIS — J9610 Chronic respiratory failure, unspecified whether with hypoxia or hypercapnia: Secondary | ICD-10-CM | POA: Diagnosis not present

## 2017-05-07 DIAGNOSIS — J45909 Unspecified asthma, uncomplicated: Secondary | ICD-10-CM | POA: Diagnosis not present

## 2017-05-07 DIAGNOSIS — R0902 Hypoxemia: Secondary | ICD-10-CM | POA: Diagnosis not present

## 2017-05-10 DIAGNOSIS — J45901 Unspecified asthma with (acute) exacerbation: Secondary | ICD-10-CM | POA: Diagnosis not present

## 2017-05-10 DIAGNOSIS — R69 Illness, unspecified: Secondary | ICD-10-CM | POA: Diagnosis not present

## 2017-05-10 DIAGNOSIS — J209 Acute bronchitis, unspecified: Secondary | ICD-10-CM | POA: Diagnosis not present

## 2017-05-25 ENCOUNTER — Other Ambulatory Visit: Payer: Self-pay | Admitting: Pulmonary Disease

## 2017-05-25 DIAGNOSIS — R69 Illness, unspecified: Secondary | ICD-10-CM | POA: Diagnosis not present

## 2017-06-06 DIAGNOSIS — J9610 Chronic respiratory failure, unspecified whether with hypoxia or hypercapnia: Secondary | ICD-10-CM | POA: Diagnosis not present

## 2017-06-06 DIAGNOSIS — R0902 Hypoxemia: Secondary | ICD-10-CM | POA: Diagnosis not present

## 2017-06-06 DIAGNOSIS — J45909 Unspecified asthma, uncomplicated: Secondary | ICD-10-CM | POA: Diagnosis not present

## 2017-06-06 DIAGNOSIS — G4733 Obstructive sleep apnea (adult) (pediatric): Secondary | ICD-10-CM | POA: Diagnosis not present

## 2017-07-07 DIAGNOSIS — J45909 Unspecified asthma, uncomplicated: Secondary | ICD-10-CM | POA: Diagnosis not present

## 2017-07-07 DIAGNOSIS — J9610 Chronic respiratory failure, unspecified whether with hypoxia or hypercapnia: Secondary | ICD-10-CM | POA: Diagnosis not present

## 2017-07-07 DIAGNOSIS — G4733 Obstructive sleep apnea (adult) (pediatric): Secondary | ICD-10-CM | POA: Diagnosis not present

## 2017-07-07 DIAGNOSIS — R0902 Hypoxemia: Secondary | ICD-10-CM | POA: Diagnosis not present

## 2017-08-04 DIAGNOSIS — E1121 Type 2 diabetes mellitus with diabetic nephropathy: Secondary | ICD-10-CM | POA: Diagnosis not present

## 2017-08-04 DIAGNOSIS — I129 Hypertensive chronic kidney disease with stage 1 through stage 4 chronic kidney disease, or unspecified chronic kidney disease: Secondary | ICD-10-CM | POA: Diagnosis not present

## 2017-08-04 DIAGNOSIS — E782 Mixed hyperlipidemia: Secondary | ICD-10-CM | POA: Diagnosis not present

## 2017-08-04 DIAGNOSIS — N181 Chronic kidney disease, stage 1: Secondary | ICD-10-CM | POA: Diagnosis not present

## 2017-08-04 DIAGNOSIS — J9611 Chronic respiratory failure with hypoxia: Secondary | ICD-10-CM | POA: Diagnosis not present

## 2017-08-04 DIAGNOSIS — D509 Iron deficiency anemia, unspecified: Secondary | ICD-10-CM | POA: Diagnosis not present

## 2017-08-04 DIAGNOSIS — Z23 Encounter for immunization: Secondary | ICD-10-CM | POA: Diagnosis not present

## 2017-08-07 DIAGNOSIS — G4733 Obstructive sleep apnea (adult) (pediatric): Secondary | ICD-10-CM | POA: Diagnosis not present

## 2017-08-07 DIAGNOSIS — J9610 Chronic respiratory failure, unspecified whether with hypoxia or hypercapnia: Secondary | ICD-10-CM | POA: Diagnosis not present

## 2017-08-07 DIAGNOSIS — R0902 Hypoxemia: Secondary | ICD-10-CM | POA: Diagnosis not present

## 2017-08-07 DIAGNOSIS — J45909 Unspecified asthma, uncomplicated: Secondary | ICD-10-CM | POA: Diagnosis not present

## 2017-09-04 DIAGNOSIS — J45909 Unspecified asthma, uncomplicated: Secondary | ICD-10-CM | POA: Diagnosis not present

## 2017-09-04 DIAGNOSIS — J9610 Chronic respiratory failure, unspecified whether with hypoxia or hypercapnia: Secondary | ICD-10-CM | POA: Diagnosis not present

## 2017-09-04 DIAGNOSIS — R0902 Hypoxemia: Secondary | ICD-10-CM | POA: Diagnosis not present

## 2017-09-04 DIAGNOSIS — G4733 Obstructive sleep apnea (adult) (pediatric): Secondary | ICD-10-CM | POA: Diagnosis not present

## 2017-10-05 DIAGNOSIS — G4733 Obstructive sleep apnea (adult) (pediatric): Secondary | ICD-10-CM | POA: Diagnosis not present

## 2017-10-05 DIAGNOSIS — J45909 Unspecified asthma, uncomplicated: Secondary | ICD-10-CM | POA: Diagnosis not present

## 2017-10-05 DIAGNOSIS — R0902 Hypoxemia: Secondary | ICD-10-CM | POA: Diagnosis not present

## 2017-10-05 DIAGNOSIS — J9610 Chronic respiratory failure, unspecified whether with hypoxia or hypercapnia: Secondary | ICD-10-CM | POA: Diagnosis not present

## 2017-11-04 DIAGNOSIS — J45909 Unspecified asthma, uncomplicated: Secondary | ICD-10-CM | POA: Diagnosis not present

## 2017-11-04 DIAGNOSIS — R0902 Hypoxemia: Secondary | ICD-10-CM | POA: Diagnosis not present

## 2017-11-04 DIAGNOSIS — G4733 Obstructive sleep apnea (adult) (pediatric): Secondary | ICD-10-CM | POA: Diagnosis not present

## 2017-11-04 DIAGNOSIS — J9610 Chronic respiratory failure, unspecified whether with hypoxia or hypercapnia: Secondary | ICD-10-CM | POA: Diagnosis not present

## 2017-11-21 ENCOUNTER — Encounter: Payer: Self-pay | Admitting: Pulmonary Disease

## 2017-11-21 ENCOUNTER — Ambulatory Visit: Payer: Medicare HMO | Admitting: Pulmonary Disease

## 2017-11-21 VITALS — BP 118/80 | HR 109 | Ht 67.0 in | Wt 254.0 lb

## 2017-11-21 DIAGNOSIS — J9610 Chronic respiratory failure, unspecified whether with hypoxia or hypercapnia: Secondary | ICD-10-CM

## 2017-11-21 DIAGNOSIS — R5381 Other malaise: Secondary | ICD-10-CM | POA: Diagnosis not present

## 2017-11-21 DIAGNOSIS — G4733 Obstructive sleep apnea (adult) (pediatric): Secondary | ICD-10-CM

## 2017-11-21 DIAGNOSIS — E662 Morbid (severe) obesity with alveolar hypoventilation: Secondary | ICD-10-CM | POA: Diagnosis not present

## 2017-11-21 DIAGNOSIS — J45998 Other asthma: Secondary | ICD-10-CM

## 2017-11-21 NOTE — Patient Instructions (Signed)
Follow up in 1 year.

## 2017-11-21 NOTE — Progress Notes (Signed)
[facility]  Chief Complaint  Patient presents with  . Follow-up    Pt has increase of SOB with exertion, wheezing, and coughing.  Pt is not using bipap machine at all.     Vital signs: BP 118/80 (BP Location: Left Arm, Cuff Size: Normal)   Pulse (!) 109   Ht 5\' 7"  (1.702 m)   Wt 254 lb (115.2 kg)   SpO2 97%   BMI 39.78 kg/m   History of Present Illness: Shelley [name] is a 52 y.o. female with OSA, OHS, and asthma.  She is here with her caregiver.    She hasn't been consistent with Bipap.  She gets tired and forgets.  She then starts falling asleep during the day.  She is not very active.  She gets winded quickly when she walks for short distance.  She will get wheeze from her throat area.  She recovers after resting for few minutes.  Not having chest pain, or palpitations.  She gets some cough with bubbly sputum.  Not having leg swelling, but gets cramps in her legs if she walks too far.  Physical Exam:  General - pleasant, wearing oxygen Eyes - pupils reactive, wears glasses ENT - no sinus tenderness, no oral exudate, no LAN, no stridor Cardiac - regular, no murmur Chest - no wheeze, rales Abd - soft, non tender Ext - no edema Skin - no rashes Neuro - normal strength Psych - normal mood   Assessment/Plan:  Obstructive sleep apnea. - she continues to have difficulty with compliance - discussed options to help get her to use Bipap on more regular basis  Chronic respiratory failure with hypoxia from OHS. - 4 liters oxygen with Bipap - she uses oxygen during the day with activity  Persistent asthma. - continue pulmicort daily and singulair qhs - prn albuterol  Morbid obesity with physical deconditioning. - most of her current systems seem related to this - discussed importance of weight loss - reviewed options to improve her exercise stamina  Goals of care. - DNR/DNI  Patient Instructions  Follow up in 1  year    [name], MD [facility] 11/21/2017, 9:41 AM  Flow Sheet  Sleep tests: PSG 09/13/10>>AHI 98.6, SpO2 60% ONO with CPAP and 3 liters 01/13/12>>Test time 7 hrs 6 min. Mean SpO2 89%, low SpO2 62%. Spent 2 hrs 34 min with SpO2 < 88% CPAP 12/16/11 to 01/14/12>>Used on 6 of 30 nights with average 7 hrs 3 min. Average AHI 1.8 with CPAP 10 cm H2O. ONO with CPAP and 3 liters 01/13/12 >> Test time 7 hrs 6 min.  Mean SpO2 89%, low SpO2 62%.  Spent 2 hrs 34 min with SpO2 < 88%. ONO with CPAP and 4 liters 01/31/12 >> Test time 7 hrs 36 min.  Mean SpO2 91.6%, low SpO2 73%.  Spent 56 min with SpO2 < 88%. BPAP 02/20/12>>17/11 cm H2O with 4 liters oxygen.  Past Medical History: She  has a past medical history of Anxiety, Arthritis, Asthma, AVM (arteriovenous malformation) of colon, Diabetes mellitus, Diverticulosis, GERD (gastroesophageal reflux disease), GI bleed (09/2015), Hemorrhoids, Hiatal hernia, Hyperlipidemia, Hypertension, Mental retardation, and Pneumonia.  Past Surgical History: She  has a past surgical history that includes Cholecystectomy; Eye surgery; Colonoscopy (N/A, 02/06/2013); Esophagogastroduodenoscopy (N/A, 02/06/2013); Colonoscopy (N/A, 07/31/2015); Hot hemostasis (N/A, 07/31/2015); Flexible sigmoidoscopy (N/A, 09/18/2015); and Hemorrhoid surgery (N/A, 05/06/2016).  Family History: Her family history includes Bone cancer in her father; COPD in her mother.  Social History: She  reports that she has never smoked. She has never used smokeless tobacco. She reports that she does not drink alcohol or use drugs.  Medications: Allergies as of 11/21/2017      Reactions   Fish Allergy Swelling   Pepto-bismol [bismuth Subsalicylate] Nausea And Vomiting   Cortisone Itching, Rash   Injections and cream only.  PO ok.   Trichophyton Itching   Also sneezing accompanying as well      Medication List        Accurate as of 11/21/17  9:41 AM. Always use your most recent  med list.          albuterol (2.5 MG/3ML) 0.083% nebulizer solution Commonly known as:  PROVENTIL TAKE 3 MLS (2.5 MG TOTAL) BY NEBULIZATION EVERY 6 HOURS IF NEEDED FOR WHEEZING/SHORTNESS OF BREATH   atorvastatin 10 MG tablet Commonly known as:  LIPITOR Take 10 mg by mouth daily.   budesonide 0.25 MG/2ML nebulizer solution Commonly known as:  PULMICORT Take 0.25 mg by nebulization 2 (two) times daily.   famotidine 20 MG tablet Commonly known as:  PEPCID Take 1 tablet (20 mg total) by mouth at bedtime.   FERREX 150 150 MG capsule Generic drug:  iron polysaccharides Take 150 mg by mouth daily.   hydrocortisone-pramoxine 2.5-1 % rectal cream Commonly known as:  ANALPRAM-HC Place 1 application rectally 4 (four) times daily.   ipratropium-albuterol 0.5-2.5 (3) MG/3ML Soln Commonly known as:  DUONEB Take 3 mLs by nebulization every 6 (six) hours as needed (SOB/WHEEZING).   loratadine 10 MG tablet Commonly known as:  CLARITIN Take 10 mg by mouth daily as needed for allergies.   losartan-hydrochlorothiazide 100-12.5 MG tablet Commonly known as:  HYZAAR Take 1 tablet by mouth daily.   metFORMIN 500 MG tablet Commonly known as:  GLUCOPHAGE Take 1,000 mg by mouth every evening.   montelukast 10 MG tablet Commonly known as:  SINGULAIR Take 10 mg by mouth at bedtime.   nystatin powder Commonly known as:  MYCOSTATIN/NYSTOP Apply topically 3 (three) times daily.   omeprazole 40 MG capsule Commonly known as:  PRILOSEC Take 40 mg by mouth daily.   sertraline 50 MG tablet Commonly known as:  ZOLOFT Take 100 mg by mouth daily.

## 2017-12-02 DIAGNOSIS — R69 Illness, unspecified: Secondary | ICD-10-CM | POA: Diagnosis not present

## 2017-12-05 DIAGNOSIS — E119 Type 2 diabetes mellitus without complications: Secondary | ICD-10-CM | POA: Diagnosis not present

## 2017-12-05 DIAGNOSIS — J9610 Chronic respiratory failure, unspecified whether with hypoxia or hypercapnia: Secondary | ICD-10-CM | POA: Diagnosis not present

## 2017-12-05 DIAGNOSIS — Z8631 Personal history of diabetic foot ulcer: Secondary | ICD-10-CM | POA: Diagnosis not present

## 2017-12-05 DIAGNOSIS — E1121 Type 2 diabetes mellitus with diabetic nephropathy: Secondary | ICD-10-CM | POA: Diagnosis not present

## 2017-12-05 DIAGNOSIS — N181 Chronic kidney disease, stage 1: Secondary | ICD-10-CM | POA: Diagnosis not present

## 2017-12-05 DIAGNOSIS — L84 Corns and callosities: Secondary | ICD-10-CM | POA: Diagnosis not present

## 2017-12-05 DIAGNOSIS — J45909 Unspecified asthma, uncomplicated: Secondary | ICD-10-CM | POA: Diagnosis not present

## 2017-12-05 DIAGNOSIS — R0902 Hypoxemia: Secondary | ICD-10-CM | POA: Diagnosis not present

## 2017-12-05 DIAGNOSIS — G4733 Obstructive sleep apnea (adult) (pediatric): Secondary | ICD-10-CM | POA: Diagnosis not present

## 2018-01-04 DIAGNOSIS — G4733 Obstructive sleep apnea (adult) (pediatric): Secondary | ICD-10-CM | POA: Diagnosis not present

## 2018-01-04 DIAGNOSIS — J9610 Chronic respiratory failure, unspecified whether with hypoxia or hypercapnia: Secondary | ICD-10-CM | POA: Diagnosis not present

## 2018-01-04 DIAGNOSIS — R0902 Hypoxemia: Secondary | ICD-10-CM | POA: Diagnosis not present

## 2018-01-04 DIAGNOSIS — J45909 Unspecified asthma, uncomplicated: Secondary | ICD-10-CM | POA: Diagnosis not present

## 2018-01-19 DIAGNOSIS — R0789 Other chest pain: Secondary | ICD-10-CM | POA: Diagnosis not present

## 2018-01-19 DIAGNOSIS — R350 Frequency of micturition: Secondary | ICD-10-CM | POA: Diagnosis not present

## 2018-02-04 DIAGNOSIS — G4733 Obstructive sleep apnea (adult) (pediatric): Secondary | ICD-10-CM | POA: Diagnosis not present

## 2018-02-04 DIAGNOSIS — J45909 Unspecified asthma, uncomplicated: Secondary | ICD-10-CM | POA: Diagnosis not present

## 2018-02-04 DIAGNOSIS — J9610 Chronic respiratory failure, unspecified whether with hypoxia or hypercapnia: Secondary | ICD-10-CM | POA: Diagnosis not present

## 2018-02-04 DIAGNOSIS — R0902 Hypoxemia: Secondary | ICD-10-CM | POA: Diagnosis not present

## 2018-03-07 DIAGNOSIS — R0902 Hypoxemia: Secondary | ICD-10-CM | POA: Diagnosis not present

## 2018-03-07 DIAGNOSIS — G4733 Obstructive sleep apnea (adult) (pediatric): Secondary | ICD-10-CM | POA: Diagnosis not present

## 2018-03-07 DIAGNOSIS — J9610 Chronic respiratory failure, unspecified whether with hypoxia or hypercapnia: Secondary | ICD-10-CM | POA: Diagnosis not present

## 2018-03-07 DIAGNOSIS — J45909 Unspecified asthma, uncomplicated: Secondary | ICD-10-CM | POA: Diagnosis not present

## 2018-03-14 DIAGNOSIS — R69 Illness, unspecified: Secondary | ICD-10-CM | POA: Diagnosis not present

## 2018-03-26 DIAGNOSIS — R69 Illness, unspecified: Secondary | ICD-10-CM | POA: Diagnosis not present

## 2018-03-26 DIAGNOSIS — K219 Gastro-esophageal reflux disease without esophagitis: Secondary | ICD-10-CM | POA: Diagnosis not present

## 2018-04-06 DIAGNOSIS — J45909 Unspecified asthma, uncomplicated: Secondary | ICD-10-CM | POA: Diagnosis not present

## 2018-04-06 DIAGNOSIS — J9610 Chronic respiratory failure, unspecified whether with hypoxia or hypercapnia: Secondary | ICD-10-CM | POA: Diagnosis not present

## 2018-04-06 DIAGNOSIS — R0902 Hypoxemia: Secondary | ICD-10-CM | POA: Diagnosis not present

## 2018-04-06 DIAGNOSIS — G4733 Obstructive sleep apnea (adult) (pediatric): Secondary | ICD-10-CM | POA: Diagnosis not present

## 2018-05-07 DIAGNOSIS — G4733 Obstructive sleep apnea (adult) (pediatric): Secondary | ICD-10-CM | POA: Diagnosis not present

## 2018-05-07 DIAGNOSIS — J9610 Chronic respiratory failure, unspecified whether with hypoxia or hypercapnia: Secondary | ICD-10-CM | POA: Diagnosis not present

## 2018-05-07 DIAGNOSIS — J45909 Unspecified asthma, uncomplicated: Secondary | ICD-10-CM | POA: Diagnosis not present

## 2018-05-07 DIAGNOSIS — R0902 Hypoxemia: Secondary | ICD-10-CM | POA: Diagnosis not present

## 2018-05-23 DIAGNOSIS — K219 Gastro-esophageal reflux disease without esophagitis: Secondary | ICD-10-CM | POA: Diagnosis not present

## 2018-05-23 DIAGNOSIS — R1033 Periumbilical pain: Secondary | ICD-10-CM | POA: Diagnosis not present

## 2018-05-23 DIAGNOSIS — K625 Hemorrhage of anus and rectum: Secondary | ICD-10-CM | POA: Diagnosis not present

## 2018-05-23 DIAGNOSIS — R197 Diarrhea, unspecified: Secondary | ICD-10-CM | POA: Diagnosis not present

## 2018-05-30 DIAGNOSIS — R296 Repeated falls: Secondary | ICD-10-CM | POA: Diagnosis not present

## 2018-05-30 DIAGNOSIS — N3941 Urge incontinence: Secondary | ICD-10-CM | POA: Diagnosis not present

## 2018-05-30 DIAGNOSIS — R278 Other lack of coordination: Secondary | ICD-10-CM | POA: Diagnosis not present

## 2018-05-30 DIAGNOSIS — R2681 Unsteadiness on feet: Secondary | ICD-10-CM | POA: Diagnosis not present

## 2018-05-30 DIAGNOSIS — M6281 Muscle weakness (generalized): Secondary | ICD-10-CM | POA: Diagnosis not present

## 2018-05-30 DIAGNOSIS — M2681 Anterior soft tissue impingement: Secondary | ICD-10-CM | POA: Diagnosis not present

## 2018-06-06 DIAGNOSIS — R0902 Hypoxemia: Secondary | ICD-10-CM | POA: Diagnosis not present

## 2018-06-06 DIAGNOSIS — J45909 Unspecified asthma, uncomplicated: Secondary | ICD-10-CM | POA: Diagnosis not present

## 2018-06-06 DIAGNOSIS — G4733 Obstructive sleep apnea (adult) (pediatric): Secondary | ICD-10-CM | POA: Diagnosis not present

## 2018-06-06 DIAGNOSIS — J9610 Chronic respiratory failure, unspecified whether with hypoxia or hypercapnia: Secondary | ICD-10-CM | POA: Diagnosis not present

## 2018-06-14 DIAGNOSIS — R278 Other lack of coordination: Secondary | ICD-10-CM | POA: Diagnosis not present

## 2018-06-14 DIAGNOSIS — R2681 Unsteadiness on feet: Secondary | ICD-10-CM | POA: Diagnosis not present

## 2018-06-14 DIAGNOSIS — N3941 Urge incontinence: Secondary | ICD-10-CM | POA: Diagnosis not present

## 2018-06-14 DIAGNOSIS — M6281 Muscle weakness (generalized): Secondary | ICD-10-CM | POA: Diagnosis not present

## 2018-06-14 DIAGNOSIS — R296 Repeated falls: Secondary | ICD-10-CM | POA: Diagnosis not present

## 2018-06-18 DIAGNOSIS — R2681 Unsteadiness on feet: Secondary | ICD-10-CM | POA: Diagnosis not present

## 2018-06-18 DIAGNOSIS — R278 Other lack of coordination: Secondary | ICD-10-CM | POA: Diagnosis not present

## 2018-06-18 DIAGNOSIS — R296 Repeated falls: Secondary | ICD-10-CM | POA: Diagnosis not present

## 2018-06-18 DIAGNOSIS — N3941 Urge incontinence: Secondary | ICD-10-CM | POA: Diagnosis not present

## 2018-06-18 DIAGNOSIS — M6281 Muscle weakness (generalized): Secondary | ICD-10-CM | POA: Diagnosis not present

## 2018-06-20 DIAGNOSIS — R2681 Unsteadiness on feet: Secondary | ICD-10-CM | POA: Diagnosis not present

## 2018-06-20 DIAGNOSIS — R278 Other lack of coordination: Secondary | ICD-10-CM | POA: Diagnosis not present

## 2018-06-20 DIAGNOSIS — N3941 Urge incontinence: Secondary | ICD-10-CM | POA: Diagnosis not present

## 2018-06-20 DIAGNOSIS — M6281 Muscle weakness (generalized): Secondary | ICD-10-CM | POA: Diagnosis not present

## 2018-06-20 DIAGNOSIS — R296 Repeated falls: Secondary | ICD-10-CM | POA: Diagnosis not present

## 2018-06-21 DIAGNOSIS — R2681 Unsteadiness on feet: Secondary | ICD-10-CM | POA: Diagnosis not present

## 2018-06-21 DIAGNOSIS — R296 Repeated falls: Secondary | ICD-10-CM | POA: Diagnosis not present

## 2018-06-21 DIAGNOSIS — R278 Other lack of coordination: Secondary | ICD-10-CM | POA: Diagnosis not present

## 2018-06-21 DIAGNOSIS — M6281 Muscle weakness (generalized): Secondary | ICD-10-CM | POA: Diagnosis not present

## 2018-06-21 DIAGNOSIS — N3941 Urge incontinence: Secondary | ICD-10-CM | POA: Diagnosis not present

## 2018-06-25 DIAGNOSIS — R197 Diarrhea, unspecified: Secondary | ICD-10-CM | POA: Diagnosis not present

## 2018-06-25 DIAGNOSIS — R278 Other lack of coordination: Secondary | ICD-10-CM | POA: Diagnosis not present

## 2018-06-25 DIAGNOSIS — N3941 Urge incontinence: Secondary | ICD-10-CM | POA: Diagnosis not present

## 2018-06-25 DIAGNOSIS — R2681 Unsteadiness on feet: Secondary | ICD-10-CM | POA: Diagnosis not present

## 2018-06-25 DIAGNOSIS — R296 Repeated falls: Secondary | ICD-10-CM | POA: Diagnosis not present

## 2018-06-25 DIAGNOSIS — M6281 Muscle weakness (generalized): Secondary | ICD-10-CM | POA: Diagnosis not present

## 2018-06-25 DIAGNOSIS — K219 Gastro-esophageal reflux disease without esophagitis: Secondary | ICD-10-CM | POA: Diagnosis not present

## 2018-06-27 DIAGNOSIS — R296 Repeated falls: Secondary | ICD-10-CM | POA: Diagnosis not present

## 2018-06-27 DIAGNOSIS — R278 Other lack of coordination: Secondary | ICD-10-CM | POA: Diagnosis not present

## 2018-06-27 DIAGNOSIS — N3941 Urge incontinence: Secondary | ICD-10-CM | POA: Diagnosis not present

## 2018-06-27 DIAGNOSIS — M6281 Muscle weakness (generalized): Secondary | ICD-10-CM | POA: Diagnosis not present

## 2018-06-27 DIAGNOSIS — R2681 Unsteadiness on feet: Secondary | ICD-10-CM | POA: Diagnosis not present

## 2018-07-04 DIAGNOSIS — M6281 Muscle weakness (generalized): Secondary | ICD-10-CM | POA: Diagnosis not present

## 2018-07-04 DIAGNOSIS — R278 Other lack of coordination: Secondary | ICD-10-CM | POA: Diagnosis not present

## 2018-07-04 DIAGNOSIS — N3941 Urge incontinence: Secondary | ICD-10-CM | POA: Diagnosis not present

## 2018-07-04 DIAGNOSIS — R2681 Unsteadiness on feet: Secondary | ICD-10-CM | POA: Diagnosis not present

## 2018-07-04 DIAGNOSIS — R296 Repeated falls: Secondary | ICD-10-CM | POA: Diagnosis not present

## 2018-07-06 DIAGNOSIS — N3941 Urge incontinence: Secondary | ICD-10-CM | POA: Diagnosis not present

## 2018-07-06 DIAGNOSIS — R2681 Unsteadiness on feet: Secondary | ICD-10-CM | POA: Diagnosis not present

## 2018-07-06 DIAGNOSIS — R296 Repeated falls: Secondary | ICD-10-CM | POA: Diagnosis not present

## 2018-07-06 DIAGNOSIS — M6281 Muscle weakness (generalized): Secondary | ICD-10-CM | POA: Diagnosis not present

## 2018-07-06 DIAGNOSIS — R278 Other lack of coordination: Secondary | ICD-10-CM | POA: Diagnosis not present

## 2018-07-07 DIAGNOSIS — G4733 Obstructive sleep apnea (adult) (pediatric): Secondary | ICD-10-CM | POA: Diagnosis not present

## 2018-07-07 DIAGNOSIS — J45909 Unspecified asthma, uncomplicated: Secondary | ICD-10-CM | POA: Diagnosis not present

## 2018-07-07 DIAGNOSIS — R0902 Hypoxemia: Secondary | ICD-10-CM | POA: Diagnosis not present

## 2018-07-07 DIAGNOSIS — J9610 Chronic respiratory failure, unspecified whether with hypoxia or hypercapnia: Secondary | ICD-10-CM | POA: Diagnosis not present

## 2018-07-09 DIAGNOSIS — R2681 Unsteadiness on feet: Secondary | ICD-10-CM | POA: Diagnosis not present

## 2018-07-09 DIAGNOSIS — R278 Other lack of coordination: Secondary | ICD-10-CM | POA: Diagnosis not present

## 2018-07-09 DIAGNOSIS — N3941 Urge incontinence: Secondary | ICD-10-CM | POA: Diagnosis not present

## 2018-07-09 DIAGNOSIS — R296 Repeated falls: Secondary | ICD-10-CM | POA: Diagnosis not present

## 2018-07-09 DIAGNOSIS — M6281 Muscle weakness (generalized): Secondary | ICD-10-CM | POA: Diagnosis not present

## 2018-07-10 ENCOUNTER — Other Ambulatory Visit: Payer: Self-pay

## 2018-07-10 ENCOUNTER — Encounter (HOSPITAL_COMMUNITY): Payer: Self-pay

## 2018-07-10 ENCOUNTER — Emergency Department (HOSPITAL_COMMUNITY): Payer: Medicare HMO

## 2018-07-10 ENCOUNTER — Emergency Department (HOSPITAL_COMMUNITY)
Admission: EM | Admit: 2018-07-10 | Discharge: 2018-07-11 | Disposition: A | Discharge status: Home / Self Care | Payer: Medicare HMO | Attending: Emergency Medicine | Admitting: Emergency Medicine

## 2018-07-10 DIAGNOSIS — R05 Cough: Secondary | ICD-10-CM | POA: Diagnosis not present

## 2018-07-10 DIAGNOSIS — N3000 Acute cystitis without hematuria: Secondary | ICD-10-CM

## 2018-07-10 DIAGNOSIS — R52 Pain, unspecified: Secondary | ICD-10-CM | POA: Diagnosis not present

## 2018-07-10 DIAGNOSIS — R059 Cough, unspecified: Secondary | ICD-10-CM

## 2018-07-10 DIAGNOSIS — E1165 Type 2 diabetes mellitus with hyperglycemia: Secondary | ICD-10-CM | POA: Diagnosis not present

## 2018-07-10 DIAGNOSIS — I10 Essential (primary) hypertension: Secondary | ICD-10-CM | POA: Diagnosis not present

## 2018-07-10 DIAGNOSIS — M79651 Pain in right thigh: Secondary | ICD-10-CM | POA: Insufficient documentation

## 2018-07-10 DIAGNOSIS — M25561 Pain in right knee: Secondary | ICD-10-CM | POA: Insufficient documentation

## 2018-07-10 DIAGNOSIS — I959 Hypotension, unspecified: Secondary | ICD-10-CM | POA: Diagnosis not present

## 2018-07-10 DIAGNOSIS — R0602 Shortness of breath: Secondary | ICD-10-CM | POA: Diagnosis not present

## 2018-07-10 LAB — COMPREHENSIVE METABOLIC PANEL
ALT: 14 U/L (ref 0–44)
AST: 23 U/L (ref 15–41)
Albumin: 3.5 g/dL (ref 3.5–5.0)
Alkaline Phosphatase: 59 U/L (ref 38–126)
Anion gap: 8 (ref 5–15)
BUN: 14 mg/dL (ref 6–20)
CO2: 39 mmol/L — AB (ref 22–32)
Calcium: 8.3 mg/dL — ABNORMAL LOW (ref 8.9–10.3)
Chloride: 85 mmol/L — ABNORMAL LOW (ref 98–111)
Creatinine, Ser: 0.8 mg/dL (ref 0.44–1.00)
GFR calc non Af Amer: >60 mL/min (ref >60)
Glucose, Bld: 291 mg/dL — ABNORMAL HIGH (ref 70–99)
Potassium: 3.5 mmol/L (ref 3.5–5.1)
Sodium: 132 mmol/L — ABNORMAL LOW (ref 135–145)
Total Bilirubin: 0.8 mg/dL (ref 0.3–1.2)
Total Protein: 8 g/dL (ref 6.5–8.1)

## 2018-07-10 LAB — CBC WITH DIFFERENTIAL/PLATELET
Abs Immature Granulocytes: 0 10*3/uL (ref 0.00–0.07)
BASOS PCT: 0 %
Basophils Absolute: 0 10*3/uL (ref 0.0–0.1)
EOS PCT: 0 %
Eosinophils Absolute: 0 10*3/uL (ref 0.0–0.5)
HEMATOCRIT: 32.6 % — AB (ref 36.0–46.0)
Hemoglobin: 9.7 g/dL — ABNORMAL LOW (ref 12.0–15.0)
Immature Granulocytes: 0 %
Lymphocytes Relative: 24 %
Lymphs Abs: 0.7 10*3/uL (ref 0.7–4.0)
MCH: 26.4 pg (ref 26.0–34.0)
MCHC: 29.8 g/dL — ABNORMAL LOW (ref 30.0–36.0)
MCV: 88.8 fL (ref 80.0–100.0)
Monocytes Absolute: 0.5 10*3/uL (ref 0.1–1.0)
Monocytes Relative: 16 %
Neutro Abs: 1.8 10*3/uL (ref 1.7–7.7)
Neutrophils Relative %: 60 %
Platelets: 127 10*3/uL — ABNORMAL LOW (ref 150–400)
RBC: 3.67 MIL/uL — ABNORMAL LOW (ref 3.87–5.11)
RDW: 13.6 % (ref 11.5–15.5)
WBC: 3 10*3/uL — ABNORMAL LOW (ref 4.0–10.5)
nRBC: 0 % (ref 0.0–0.2)

## 2018-07-10 MED ORDER — SODIUM CHLORIDE 0.9 % IV SOLN
INTRAVENOUS | Status: DC
  Administered 2018-07-10: 23:00:00 via INTRAVENOUS

## 2018-07-10 MED ORDER — ACETAMINOPHEN 325 MG PO TABS
650.0000 mg | ORAL_TABLET | Freq: Once | ORAL | Status: AC
  Administered 2018-07-10: 650 mg via ORAL
  Filled 2018-07-10: qty 2

## 2018-07-10 MED ORDER — TRAMADOL HCL 50 MG PO TABS
50.0000 mg | ORAL_TABLET | Freq: Once | ORAL | Status: AC
  Administered 2018-07-10: 50 mg via ORAL
  Filled 2018-07-10: qty 1

## 2018-07-10 NOTE — ED Provider Notes (Addendum)
[facility]-EMERGENCY DEPT Provider Note   CSN: [id number] Arrival date & time: 07/10/18  1920     History   Chief Complaint No chief complaint on file.   HPI Shelley [name] is a 53 y.o. female.  53 year old female presents with right knee and right thigh pain.  Patient thinks that she may have overdone it with recent exercises.  She also states that she fell while trying to use her walker.  Denies any head injury.  No complaints of hip discomfort.  Pain is mostly at the right lateral thigh.  She is able to bend and extend her knee fine.  Symptoms characterizes dull and worse with movement and better with remaining still.  No treatment used prior to arrival.     Past Medical History:  Diagnosis Date  . Anxiety   . Arthritis   . Asthma   . AVM (arteriovenous malformation) of colon   . Diabetes mellitus   . Diverticulosis   . GERD (gastroesophageal reflux disease)   . GI bleed 09/2015  . Hemorrhoids    Internal and external  . Hiatal hernia   . Hyperlipidemia   . Hypertension   . Mental retardation   . Pneumonia     Patient Active Problem List   Diagnosis Date Noted  . Acute renal failure (ARF) (HCC)   . AKI (acute kidney injury) (HCC) 05/09/2016  . Acute on chronic respiratory failure (HCC) 05/09/2016  . Chronic constipation 05/05/2016  . AVM (arteriovenous malformation) of colon 05/05/2016  . Acute lower GI bleeding 05/03/2016  . Rectal bleeding 03/11/2016  . Hyponatremia 03/11/2016  . Acute blood loss anemia   . Bleeding gastrointestinal   . Obesity hypoventilation syndrome (HCC)   . BiPAP (biphasic positive airway pressure) dependence   . Diabetes mellitus type 2, noninsulin dependent (HCC)   . Symptomatic anemia 07/30/2015  . Anemia 07/29/2015  . Lower GI bleed 07/29/2015  . Diverticulosis 07/29/2015  . History of arteriovenous malformation (AVM) 07/29/2015  . Gastro-esophageal reflux disease without esophagitis 07/29/2015  .  Obesity, Class III, BMI 40-49.9 (morbid obesity) (HCC) 03/06/2013  . Bleeding internal hemorrhoids 03/06/2013  . Dependence on supplemental oxygen 03/06/2013  . Anxiety   . Mental retardation   . Iron deficiency anemia due to chronic blood loss 02/06/2013  . Hypokalemia 02/06/2013  . Diabetes mellitus (HCC) 02/06/2013  . Physical deconditioning 11/08/2012  . Cognitive developmental delay 12/02/2011  . Obstructive sleep apnea 12/02/2011  . Chronic respiratory failure (HCC) 12/02/2011  . Benign essential hypertension 08/10/2010  . 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 ALLERGIC RHINITIS 08/02/2007    Past Surgical History:  Procedure Laterality Date  . CHOLECYSTECTOMY     2006  . COLONOSCOPY N/A 02/06/2013   Procedure: COLONOSCOPY;  Surgeon: Theda BelfastPatrick D Hung, MD;  Location: [facility] ENDOSCOPY;  Service: Endoscopy;  Laterality: N/A;  . COLONOSCOPY N/A 07/31/2015   Procedure: COLONOSCOPY;  Surgeon: Jeani HawkingPatrick Hung, MD;  [facility];  Service: Endoscopy;  Laterality: N/A;  . ESOPHAGOGASTRODUODENOSCOPY N/A 02/06/2013   Procedure: ESOPHAGOGASTRODUODENOSCOPY (EGD);  Surgeon: Theda BelfastPatrick D Hung, MD;  [facility];  Service: Endoscopy;  Laterality: N/A;  . EYE SURGERY    . FLEXIBLE SIGMOIDOSCOPY N/A 09/18/2015   Procedure: FLEXIBLE SIGMOIDOSCOPY;  Surgeon: Jeani HawkingPatrick Hung, MD;  [facility];  Service: Endoscopy;  Laterality: N/A;  . HEMORRHOID SURGERY N/A 05/06/2016   Procedure: HEMORRHOIDECTOMY, HEMORRHOID LIGATION;  Surgeon: Karie SodaSteven Gross, MD;  Location: [facility] ORS;  Service: General;  Laterality: N/A;  .  HOT HEMOSTASIS N/A 07/31/2015   Procedure: HOT HEMOSTASIS (ARGON PLASMA COAGULATION/BICAP);  Surgeon: Jeani Hawking, MD;  Location: [facility] ENDOSCOPY;  Service: Endoscopy;  Laterality: N/A;     OB History   No obstetric history on file.      Home Medications    Prior to Admission medications   Medication Sig Start Date End Date Taking? Authorizing Provider    albuterol (PROVENTIL) (2.5 MG/3ML) 0.083% nebulizer solution TAKE 3 MLS (2.5 MG TOTAL) BY NEBULIZATION EVERY 6 HOURS IF NEEDED FOR WHEEZING/SHORTNESS OF BREATH 05/25/17   [name], MD  atorvastatin (LIPITOR) 10 MG tablet Take 10 mg by mouth daily.    [provider]  budesonide (PULMICORT) 0.25 MG/2ML nebulizer solution Take 0.25 mg by nebulization 2 (two) times daily.     [provider]  famotidine (PEPCID) 20 MG tablet Take 1 tablet (20 mg total) by mouth at bedtime. 09/21/15   Albertine Grates, MD  FERREX 150 150 MG capsule Take 150 mg by mouth daily. 07/22/15   [provider]  hydrocortisone-pramoxine [facility]) 2.5-1 % rectal cream Place 1 application rectally 4 (four) times daily. 05/14/16   Rodolph Bong, MD  ipratropium-albuterol (DUONEB) 0.5-2.5 (3) MG/3ML SOLN Take 3 mLs by nebulization every 6 (six) hours as needed (SOB/WHEEZING). 05/14/16   Rodolph Bong, MD  loratadine (CLARITIN) 10 MG tablet Take 10 mg by mouth daily as needed for allergies.     [provider]  losartan-hydrochlorothiazide (HYZAAR) 100-12.5 MG tablet Take 1 tablet by mouth daily.  03/17/16   [provider]  metFORMIN (GLUCOPHAGE) 500 MG tablet Take 1,000 mg by mouth every evening.     [provider]  montelukast (SINGULAIR) 10 MG tablet Take 10 mg by mouth at bedtime.    [provider]  nystatin (MYCOSTATIN/NYSTOP) powder Apply topically 3 (three) times daily. 05/14/16   Rodolph Bong, MD  omeprazole (PRILOSEC) 40 MG capsule Take 40 mg by mouth daily.  04/04/16   [provider]  sertraline (ZOLOFT) 50 MG tablet Take 100 mg by mouth daily.     [provider]    Family History Family History  Problem Relation Age of Onset  . COPD Mother   . Bone cancer Father     Social History Social History   Tobacco Use  . Smoking status: Never Smoker  . Smokeless tobacco: Never Used  Substance Use Topics  . Alcohol use: No   . Drug use: No     Allergies   Fish allergy; Pepto-bismol [bismuth subsalicylate]; Cortisone; and Trichophyton   Review of Systems Review of Systems  All other systems reviewed and are negative.    Physical Exam Updated Vital Signs SpO2 98%   Physical Exam Vitals signs and nursing note reviewed.  Constitutional:      General: She is not in acute distress.    Appearance: Normal appearance. She is well-developed. She is not toxic-appearing.  HENT:     Head: Normocephalic and atraumatic.  Eyes:     General: Lids are normal.     Conjunctiva/sclera: Conjunctivae normal.     Pupils: Pupils are equal, round, and reactive to light.  Neck:     Musculoskeletal: Normal range of motion and neck supple.     Thyroid: No thyroid mass.     Trachea: No tracheal deviation.  Cardiovascular:     Rate and Rhythm: Normal rate and regular rhythm.     Heart sounds: Normal heart sounds. No murmur. No gallop.  Pulmonary:     Effort: Pulmonary effort is normal. No respiratory distress.     Breath sounds: Normal breath sounds. No stridor. No decreased breath sounds, wheezing, rhonchi or rales.  Abdominal:     General: Bowel sounds are normal. There is no distension.     Palpations: Abdomen is soft.     Tenderness: There is no abdominal tenderness. There is no rebound.  Musculoskeletal: Normal range of motion.        General: No tenderness.     Right knee: She exhibits normal range of motion, no swelling and no effusion.       Legs:  Skin:    General: Skin is warm and dry.     Findings: No abrasion or rash.  Neurological:     Mental Status: She is alert and oriented to person, place, and time.     GCS: GCS eye subscore is 4. GCS verbal subscore is 5. GCS motor subscore is 6.     Cranial Nerves: No cranial nerve deficit.     Sensory: No sensory deficit.  Psychiatric:        Speech: Speech normal.        Behavior: Behavior normal.      ED Treatments / Results  Labs (all labs  ordered are listed, but only abnormal results are displayed) Labs Reviewed - No data to display  EKG None  Radiology No results found.  Procedures Procedures (including critical care time)  Medications Ordered in ED Medications  traMADol (ULTRAM) tablet 50 mg (has no administration in time range)     Initial Impression / Assessment and Plan / ED Course  I have reviewed the triage vital signs and the nursing notes.  Pertinent labs & imaging results that were available during my care of the patient were reviewed by me and considered in my medical decision making (see chart for details).    Patient was to be discharged but then vital signs were performed and patient's temperature 102.9.  She complains of cough without abdominal pain.  No urinary symptoms.  Patient has no evidence of septic joint at this time.  X-rays of knee were negative.  Also patient's pain is at her left thigh.  Will order chest x-ray and labs.   11:31 PM Patient has no evidence of septic joint on her right knee.  Patient states that the pain in the right thigh began after she was doing physical therapy.  Chest x-ray shows bronchitis.  Slight leukopenia noted with a white count of 3.  Patient does have mild hyperglycemia to 291.  Urinalysis is pending.  Care signed out to Dr. Elesa MassedWard  Final Clinical Impressions(s) / ED Diagnoses   Final diagnoses:  None    ED Discharge Orders    None       Lorre NickAllen, Berlyn Saylor, MD 07/10/18 2214    Lorre NickAllen, Rhone Ozaki, MD 07/10/18 2332

## 2018-07-10 NOTE — ED Notes (Signed)
Bed: [facility] Expected date:  Expected time:  Means of arrival:  Comments: EMS 53yo fall from [place], right knee pain

## 2018-07-10 NOTE — ED Triage Notes (Signed)
Patient coming from [place] assistance living with c/o right knee pain. Patient to the right knee state 2 days ago. Patient also state she had a fall this morning 0730 this morning. This evening her knee state to hurt real bad and can not stand up.

## 2018-07-10 NOTE — ED Notes (Signed)
Patient transported to X-ray 

## 2018-07-11 DIAGNOSIS — R51 Headache: Secondary | ICD-10-CM | POA: Diagnosis not present

## 2018-07-11 DIAGNOSIS — M6281 Muscle weakness (generalized): Secondary | ICD-10-CM | POA: Diagnosis not present

## 2018-07-11 DIAGNOSIS — Z743 Need for continuous supervision: Secondary | ICD-10-CM | POA: Diagnosis not present

## 2018-07-11 DIAGNOSIS — N3941 Urge incontinence: Secondary | ICD-10-CM | POA: Diagnosis not present

## 2018-07-11 DIAGNOSIS — R2681 Unsteadiness on feet: Secondary | ICD-10-CM | POA: Diagnosis not present

## 2018-07-11 DIAGNOSIS — N3000 Acute cystitis without hematuria: Secondary | ICD-10-CM | POA: Diagnosis not present

## 2018-07-11 DIAGNOSIS — I10 Essential (primary) hypertension: Secondary | ICD-10-CM | POA: Diagnosis not present

## 2018-07-11 DIAGNOSIS — R278 Other lack of coordination: Secondary | ICD-10-CM | POA: Diagnosis not present

## 2018-07-11 DIAGNOSIS — R279 Unspecified lack of coordination: Secondary | ICD-10-CM | POA: Diagnosis not present

## 2018-07-11 DIAGNOSIS — M25561 Pain in right knee: Secondary | ICD-10-CM | POA: Diagnosis not present

## 2018-07-11 DIAGNOSIS — M79651 Pain in right thigh: Secondary | ICD-10-CM | POA: Diagnosis not present

## 2018-07-11 DIAGNOSIS — R69 Illness, unspecified: Secondary | ICD-10-CM | POA: Diagnosis not present

## 2018-07-11 DIAGNOSIS — R296 Repeated falls: Secondary | ICD-10-CM | POA: Diagnosis not present

## 2018-07-11 LAB — URINALYSIS, ROUTINE W REFLEX MICROSCOPIC
Bilirubin Urine: NEGATIVE
Glucose, UA: NEGATIVE mg/dL
Ketones, ur: 5 mg/dL — AB
Nitrite: NEGATIVE
Protein, ur: 100 mg/dL — AB
Specific Gravity, Urine: 1.028 (ref 1.005–1.030)
WBC, UA: >50 WBC/hpf — ABNORMAL HIGH (ref 0–5)
pH: 6 (ref 5.0–8.0)

## 2018-07-11 MED ORDER — CEPHALEXIN 500 MG PO CAPS: 500.0000 mg | ORAL_CAPSULE | Freq: Two times a day (BID) | ORAL | 0 refills | Status: DC

## 2018-07-11 MED ORDER — SODIUM CHLORIDE 0.9 % IV SOLN
1.0000 g | Freq: Once | INTRAVENOUS | Status: AC
  Administered 2018-07-11: 1 g via INTRAVENOUS
  Filled 2018-07-11: qty 10

## 2018-07-11 MED ORDER — TRAMADOL HCL 50 MG PO TABS: 50.0000 mg | ORAL_TABLET | Freq: Four times a day (QID) | ORAL | 0 refills | Status: AC | PRN

## 2018-07-11 NOTE — Discharge Instructions (Signed)
You may alternate Tylenol 1000 mg every 6 hours as needed for pain and Ibuprofen 800 mg every 8 hours as needed for pain.  Please take Ibuprofen with food. ° °

## 2018-07-11 NOTE — ED Notes (Signed)
[facility] called for transport.  

## 2018-07-11 NOTE — ED Provider Notes (Signed)
12:00 AM  Assumed care from Dr. Freida Busman.  Patient is a 53 year old female who comes in from a nursing home complaining of right lateral thigh pain and knee pain.  No history of injury.  States she has been doing physical therapy and thinks she may have overdone it.  No redness or warmth.  Full range of motion in this leg.  Found to be febrile here in the ED.  Likely viral URI.  Has had cough.  Chest x-ray shows bronchitis.  No infiltrate.  Labs show leukopenia which appears chronic.  Urinalysis pending.  Doubt septic joint.  No signs of cellulitis.  Patient does wear oxygen at her nursing facility.  1:00 AM  Pt able to ambulate with a walker which is her baseline.  Urine still pending.  1:40 AM  Pt appears to have a UTI.  Urine culture pending.  Previous urine cultures unremarkable in our system.  Will give IV ceftriaxone.  Once complete, will discharge patient back to nursing facility with prescription of tramadol as this seems to have controlled her pain.  I have reevaluated her leg and again there is no sign of cellulitis, septic arthritis, gout on exam.  No calf tenderness or swelling to suggest DVT.  Neurovascular intact distally.  Seems like a strained muscle.   At this time, I do not feel there is any life-threatening condition present. I have reviewed and discussed all results (EKG, imaging, lab, urine as appropriate) and exam findings with patient/family. I have reviewed nursing notes and appropriate previous records.  I feel the patient is safe to be discharged home without further emergent workup and can continue workup as an outpatient as needed. Discussed usual and customary return precautions. Patient/family verbalize understanding and are comfortable with this plan.  Outpatient follow-up has been provided as needed. All questions have been answered.    Ward, Layla Maw, DO 07/11/18 [phone]

## 2018-07-11 NOTE — ED Notes (Signed)
Pt ambulated with walker, as she does at home with minimal assist. Pt very slow to walk but stated she was too sleepy and didn't think she'd be able to make it home. Ward, MD notified.

## 2018-07-12 DIAGNOSIS — R2681 Unsteadiness on feet: Secondary | ICD-10-CM | POA: Diagnosis not present

## 2018-07-12 DIAGNOSIS — R296 Repeated falls: Secondary | ICD-10-CM | POA: Diagnosis not present

## 2018-07-12 DIAGNOSIS — N3941 Urge incontinence: Secondary | ICD-10-CM | POA: Diagnosis not present

## 2018-07-12 DIAGNOSIS — R278 Other lack of coordination: Secondary | ICD-10-CM | POA: Diagnosis not present

## 2018-07-12 DIAGNOSIS — M6281 Muscle weakness (generalized): Secondary | ICD-10-CM | POA: Diagnosis not present

## 2018-07-12 LAB — URINE CULTURE

## 2018-07-13 DIAGNOSIS — R278 Other lack of coordination: Secondary | ICD-10-CM | POA: Diagnosis not present

## 2018-07-13 DIAGNOSIS — N3941 Urge incontinence: Secondary | ICD-10-CM | POA: Diagnosis not present

## 2018-07-13 DIAGNOSIS — R2681 Unsteadiness on feet: Secondary | ICD-10-CM | POA: Diagnosis not present

## 2018-07-13 DIAGNOSIS — R296 Repeated falls: Secondary | ICD-10-CM | POA: Diagnosis not present

## 2018-07-13 DIAGNOSIS — M6281 Muscle weakness (generalized): Secondary | ICD-10-CM | POA: Diagnosis not present

## 2018-07-16 DIAGNOSIS — R296 Repeated falls: Secondary | ICD-10-CM | POA: Diagnosis not present

## 2018-07-16 DIAGNOSIS — R2681 Unsteadiness on feet: Secondary | ICD-10-CM | POA: Diagnosis not present

## 2018-07-16 DIAGNOSIS — N3941 Urge incontinence: Secondary | ICD-10-CM | POA: Diagnosis not present

## 2018-07-16 DIAGNOSIS — R278 Other lack of coordination: Secondary | ICD-10-CM | POA: Diagnosis not present

## 2018-07-16 DIAGNOSIS — M6281 Muscle weakness (generalized): Secondary | ICD-10-CM | POA: Diagnosis not present

## 2018-07-17 DIAGNOSIS — M6281 Muscle weakness (generalized): Secondary | ICD-10-CM | POA: Diagnosis not present

## 2018-07-17 DIAGNOSIS — R278 Other lack of coordination: Secondary | ICD-10-CM | POA: Diagnosis not present

## 2018-07-17 DIAGNOSIS — R296 Repeated falls: Secondary | ICD-10-CM | POA: Diagnosis not present

## 2018-07-17 DIAGNOSIS — N3941 Urge incontinence: Secondary | ICD-10-CM | POA: Diagnosis not present

## 2018-07-17 DIAGNOSIS — R2681 Unsteadiness on feet: Secondary | ICD-10-CM | POA: Diagnosis not present

## 2018-07-19 DIAGNOSIS — M6281 Muscle weakness (generalized): Secondary | ICD-10-CM | POA: Diagnosis not present

## 2018-07-19 DIAGNOSIS — N3941 Urge incontinence: Secondary | ICD-10-CM | POA: Diagnosis not present

## 2018-07-19 DIAGNOSIS — R278 Other lack of coordination: Secondary | ICD-10-CM | POA: Diagnosis not present

## 2018-07-19 DIAGNOSIS — R296 Repeated falls: Secondary | ICD-10-CM | POA: Diagnosis not present

## 2018-07-19 DIAGNOSIS — R2681 Unsteadiness on feet: Secondary | ICD-10-CM | POA: Diagnosis not present

## 2018-07-20 DIAGNOSIS — R296 Repeated falls: Secondary | ICD-10-CM | POA: Diagnosis not present

## 2018-07-20 DIAGNOSIS — M6281 Muscle weakness (generalized): Secondary | ICD-10-CM | POA: Diagnosis not present

## 2018-07-20 DIAGNOSIS — R278 Other lack of coordination: Secondary | ICD-10-CM | POA: Diagnosis not present

## 2018-07-20 DIAGNOSIS — N3941 Urge incontinence: Secondary | ICD-10-CM | POA: Diagnosis not present

## 2018-07-20 DIAGNOSIS — R2681 Unsteadiness on feet: Secondary | ICD-10-CM | POA: Diagnosis not present

## 2018-07-23 DIAGNOSIS — R2681 Unsteadiness on feet: Secondary | ICD-10-CM | POA: Diagnosis not present

## 2018-07-23 DIAGNOSIS — M6281 Muscle weakness (generalized): Secondary | ICD-10-CM | POA: Diagnosis not present

## 2018-07-23 DIAGNOSIS — N3941 Urge incontinence: Secondary | ICD-10-CM | POA: Diagnosis not present

## 2018-07-23 DIAGNOSIS — R278 Other lack of coordination: Secondary | ICD-10-CM | POA: Diagnosis not present

## 2018-07-23 DIAGNOSIS — R296 Repeated falls: Secondary | ICD-10-CM | POA: Diagnosis not present

## 2018-07-24 DIAGNOSIS — M6281 Muscle weakness (generalized): Secondary | ICD-10-CM | POA: Diagnosis not present

## 2018-07-24 DIAGNOSIS — N181 Chronic kidney disease, stage 1: Secondary | ICD-10-CM | POA: Diagnosis not present

## 2018-07-24 DIAGNOSIS — I129 Hypertensive chronic kidney disease with stage 1 through stage 4 chronic kidney disease, or unspecified chronic kidney disease: Secondary | ICD-10-CM | POA: Diagnosis not present

## 2018-07-24 DIAGNOSIS — J9611 Chronic respiratory failure with hypoxia: Secondary | ICD-10-CM | POA: Diagnosis not present

## 2018-07-24 DIAGNOSIS — N3941 Urge incontinence: Secondary | ICD-10-CM | POA: Diagnosis not present

## 2018-07-24 DIAGNOSIS — G4733 Obstructive sleep apnea (adult) (pediatric): Secondary | ICD-10-CM | POA: Diagnosis not present

## 2018-07-24 DIAGNOSIS — E1121 Type 2 diabetes mellitus with diabetic nephropathy: Secondary | ICD-10-CM | POA: Diagnosis not present

## 2018-07-24 DIAGNOSIS — N390 Urinary tract infection, site not specified: Secondary | ICD-10-CM | POA: Diagnosis not present

## 2018-07-24 DIAGNOSIS — R2681 Unsteadiness on feet: Secondary | ICD-10-CM | POA: Diagnosis not present

## 2018-07-24 DIAGNOSIS — R296 Repeated falls: Secondary | ICD-10-CM | POA: Diagnosis not present

## 2018-07-24 DIAGNOSIS — R278 Other lack of coordination: Secondary | ICD-10-CM | POA: Diagnosis not present

## 2018-07-24 DIAGNOSIS — D509 Iron deficiency anemia, unspecified: Secondary | ICD-10-CM | POA: Diagnosis not present

## 2018-07-24 DIAGNOSIS — Z0000 Encounter for general adult medical examination without abnormal findings: Secondary | ICD-10-CM | POA: Diagnosis not present

## 2018-07-24 DIAGNOSIS — E782 Mixed hyperlipidemia: Secondary | ICD-10-CM | POA: Diagnosis not present

## 2018-07-24 DIAGNOSIS — K219 Gastro-esophageal reflux disease without esophagitis: Secondary | ICD-10-CM | POA: Diagnosis not present

## 2018-08-07 DIAGNOSIS — R2689 Other abnormalities of gait and mobility: Secondary | ICD-10-CM | POA: Diagnosis not present

## 2018-08-07 DIAGNOSIS — Z7984 Long term (current) use of oral hypoglycemic drugs: Secondary | ICD-10-CM | POA: Diagnosis not present

## 2018-08-07 DIAGNOSIS — R0902 Hypoxemia: Secondary | ICD-10-CM | POA: Diagnosis not present

## 2018-08-07 DIAGNOSIS — J45909 Unspecified asthma, uncomplicated: Secondary | ICD-10-CM | POA: Diagnosis not present

## 2018-08-07 DIAGNOSIS — R32 Unspecified urinary incontinence: Secondary | ICD-10-CM | POA: Diagnosis not present

## 2018-08-07 DIAGNOSIS — G4733 Obstructive sleep apnea (adult) (pediatric): Secondary | ICD-10-CM | POA: Diagnosis not present

## 2018-08-07 DIAGNOSIS — J9610 Chronic respiratory failure, unspecified whether with hypoxia or hypercapnia: Secondary | ICD-10-CM | POA: Diagnosis not present

## 2018-08-07 DIAGNOSIS — J9611 Chronic respiratory failure with hypoxia: Secondary | ICD-10-CM | POA: Diagnosis not present

## 2018-08-07 DIAGNOSIS — E1121 Type 2 diabetes mellitus with diabetic nephropathy: Secondary | ICD-10-CM | POA: Diagnosis not present

## 2018-08-24 ENCOUNTER — Encounter (HOSPITAL_COMMUNITY): Payer: Self-pay | Admitting: Emergency Medicine

## 2018-08-24 ENCOUNTER — Emergency Department (HOSPITAL_COMMUNITY): Payer: Medicare HMO

## 2018-08-24 ENCOUNTER — Emergency Department (HOSPITAL_COMMUNITY)
Admission: EM | Admit: 2018-08-24 | Discharge: 2018-08-24 | Disposition: A | Discharge status: Home / Self Care | Payer: Medicare HMO | Attending: Emergency Medicine | Admitting: Emergency Medicine

## 2018-08-24 ENCOUNTER — Other Ambulatory Visit: Payer: Self-pay

## 2018-08-24 DIAGNOSIS — Z79899 Other long term (current) drug therapy: Secondary | ICD-10-CM | POA: Insufficient documentation

## 2018-08-24 DIAGNOSIS — M2550 Pain in unspecified joint: Secondary | ICD-10-CM | POA: Diagnosis not present

## 2018-08-24 DIAGNOSIS — Y999 Unspecified external cause status: Secondary | ICD-10-CM | POA: Insufficient documentation

## 2018-08-24 DIAGNOSIS — I10 Essential (primary) hypertension: Secondary | ICD-10-CM | POA: Diagnosis not present

## 2018-08-24 DIAGNOSIS — M25561 Pain in right knee: Secondary | ICD-10-CM | POA: Diagnosis not present

## 2018-08-24 DIAGNOSIS — E119 Type 2 diabetes mellitus without complications: Secondary | ICD-10-CM | POA: Diagnosis not present

## 2018-08-24 DIAGNOSIS — W19XXXA Unspecified fall, initial encounter: Secondary | ICD-10-CM | POA: Diagnosis not present

## 2018-08-24 DIAGNOSIS — R52 Pain, unspecified: Secondary | ICD-10-CM | POA: Diagnosis not present

## 2018-08-24 DIAGNOSIS — S8001XA Contusion of right knee, initial encounter: Secondary | ICD-10-CM | POA: Insufficient documentation

## 2018-08-24 DIAGNOSIS — Y929 Unspecified place or not applicable: Secondary | ICD-10-CM | POA: Diagnosis not present

## 2018-08-24 DIAGNOSIS — Y939 Activity, unspecified: Secondary | ICD-10-CM | POA: Insufficient documentation

## 2018-08-24 DIAGNOSIS — S80919A Unspecified superficial injury of unspecified knee, initial encounter: Secondary | ICD-10-CM | POA: Diagnosis not present

## 2018-08-24 DIAGNOSIS — Z7401 Bed confinement status: Secondary | ICD-10-CM | POA: Diagnosis not present

## 2018-08-24 DIAGNOSIS — S0990XA Unspecified injury of head, initial encounter: Secondary | ICD-10-CM | POA: Diagnosis not present

## 2018-08-24 MED ORDER — ACETAMINOPHEN 325 MG PO TABS
650.0000 mg | ORAL_TABLET | Freq: Once | ORAL | Status: AC
  Administered 2018-08-24: 650 mg via ORAL
  Filled 2018-08-24: qty 2

## 2018-08-24 NOTE — ED Provider Notes (Signed)
[facility] EMERGENCY DEPARTMENT Provider Note   CSN: [id number] Arrival date & time: 08/24/18  1955    History   Chief Complaint Chief Complaint  Patient presents with  . Fall    HPI DAVID KISSINGER is a 53 y.o. female.     The history is provided by the patient, medical records and the EMS personnel. No language interpreter was used.  Fall    TAIWAN SCHOOL is a 53 y.o. female who presents to the Emergency Department complaining of fall. Presents to the emergency department for evaluation following a fall. She was getting in no elevator with her walker when she said it got away from her and she fell forward, landing on both of her knees. She reports pain to her right knee. She was unable to get herself up. Unknown if she hit her head. No loss of consciousness. She denies any recent illnesses. She does have a history of COPD and is on oxygen at baseline. She does not take any blood thinners. Past Medical History:  Diagnosis Date  . Anxiety   . Arthritis   . Asthma   . AVM (arteriovenous malformation) of colon   . Diabetes mellitus   . Diverticulosis   . GERD (gastroesophageal reflux disease)   . GI bleed 09/2015  . Hemorrhoids    Internal and external  . Hiatal hernia   . Hyperlipidemia   . Hypertension   . Mental retardation   . Pneumonia     Patient Active Problem List   Diagnosis Date Noted  . Acute renal failure (ARF) (HCC)   . AKI (acute kidney injury) (HCC) 05/09/2016  . Acute on chronic respiratory failure (HCC) 05/09/2016  . Chronic constipation 05/05/2016  . AVM (arteriovenous malformation) of colon 05/05/2016  . Acute lower GI bleeding 05/03/2016  . Rectal bleeding 03/11/2016  . Hyponatremia 03/11/2016  . Acute blood loss anemia   . Bleeding gastrointestinal   . Obesity hypoventilation syndrome (HCC)   . BiPAP (biphasic positive airway pressure) dependence   . Diabetes mellitus type 2, noninsulin dependent (HCC)   . Symptomatic  anemia 07/30/2015  . Anemia 07/29/2015  . Lower GI bleed 07/29/2015  . Diverticulosis 07/29/2015  . History of arteriovenous malformation (AVM) 07/29/2015  . Gastro-esophageal reflux disease without esophagitis 07/29/2015  . Obesity, Class III, BMI 40-49.9 (morbid obesity) (HCC) 03/06/2013  . Bleeding internal hemorrhoids 03/06/2013  . Dependence on supplemental oxygen 03/06/2013  . Anxiety   . Mental retardation   . Iron deficiency anemia due to chronic blood loss 02/06/2013  . Hypokalemia 02/06/2013  . Diabetes mellitus (HCC) 02/06/2013  . Physical deconditioning 11/08/2012  . Cognitive developmental delay 12/02/2011  . Obstructive sleep apnea 12/02/2011  . Chronic respiratory failure (HCC) 12/02/2011  . Benign essential hypertension 08/10/2010  . 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 ALLERGIC RHINITIS 08/02/2007    Past Surgical History:  Procedure Laterality Date  . CHOLECYSTECTOMY     2006  . COLONOSCOPY N/A 02/06/2013   Procedure: COLONOSCOPY;  Surgeon: Theda Belfast, MD;  Location: [facility] ENDOSCOPY;  Service: Endoscopy;  Laterality: N/A;  . COLONOSCOPY N/A 07/31/2015   Procedure: COLONOSCOPY;  Surgeon: Jeani Hawking, MD;  Location: [facility] ENDOSCOPY;  Service: Endoscopy;  Laterality: N/A;  . ESOPHAGOGASTRODUODENOSCOPY N/A 02/06/2013   Procedure: ESOPHAGOGASTRODUODENOSCOPY (EGD);  Surgeon: Theda Belfast, MD;  [facility];  Service: Endoscopy;  Laterality: N/A;  . EYE SURGERY    . FLEXIBLE SIGMOIDOSCOPY N/A 09/18/2015   Procedure:  FLEXIBLE SIGMOIDOSCOPY;  Surgeon: Jeani Hawking, MD;  Location: [facility] ENDOSCOPY;  Service: Endoscopy;  Laterality: N/A;  . HEMORRHOID SURGERY N/A 05/06/2016   Procedure: HEMORRHOIDECTOMY, HEMORRHOID LIGATION;  Surgeon: Karie Soda, MD;  Location: [facility] ORS;  Service: General;  Laterality: N/A;  . HOT HEMOSTASIS N/A 07/31/2015   Procedure: HOT HEMOSTASIS (ARGON PLASMA COAGULATION/BICAP);  Surgeon: Jeani Hawking, MD;  Location: [facility]  ENDOSCOPY;  Service: Endoscopy;  Laterality: N/A;     OB History   No obstetric history on file.      Home Medications    Prior to Admission medications   Medication Sig Start Date End Date Taking? Authorizing Provider  acetaminophen (TYLENOL) 500 MG tablet Take 500 mg by mouth every 6 (six) hours as needed.   Yes [provider]  albuterol (PROVENTIL) (2.5 MG/3ML) 0.083% nebulizer solution TAKE 3 MLS (2.5 MG TOTAL) BY NEBULIZATION EVERY 6 HOURS IF NEEDED FOR WHEEZING/SHORTNESS OF BREATH Patient taking differently: Take 2.5 mg by nebulization every 6 (six) hours as needed for wheezing or shortness of breath.  05/25/17  Yes [name], MD  atorvastatin (LIPITOR) 10 MG tablet Take 10 mg by mouth daily.   Yes [provider]  budesonide (PULMICORT) 0.25 MG/2ML nebulizer solution Take 0.25 mg by nebulization 2 (two) times daily.    Yes [provider]  famotidine (PEPCID) 20 MG tablet Take 1 tablet (20 mg total) by mouth at bedtime. 09/21/15  Yes Albertine Grates, MD  FERREX 150 150 MG capsule Take 150 mg by mouth daily. 07/22/15  Yes [provider]  ipratropium-albuterol (DUONEB) 0.5-2.5 (3) MG/3ML SOLN Take 3 mLs by nebulization every 6 (six) hours as needed (SOB/WHEEZING). 05/14/16  Yes Rodolph Bong, MD  loratadine (CLARITIN) 10 MG tablet Take 10 mg by mouth daily as needed for allergies.    Yes [provider]  losartan-hydrochlorothiazide (HYZAAR) 100-12.5 MG tablet Take 1 tablet by mouth daily.  03/17/16  Yes [provider]  metFORMIN (GLUCOPHAGE) 500 MG tablet Take 1,000 mg by mouth every evening.    Yes [provider]  montelukast (SINGULAIR) 10 MG tablet Take 10 mg by mouth at bedtime.   Yes [provider]  omeprazole (PRILOSEC) 40 MG capsule Take 40 mg by mouth daily.  04/04/16  Yes [provider]  Potassium Chloride ER 20 MEQ TBCR Take 1 tablet by mouth daily. with food 06/04/18  Yes [provider]  sertraline (ZOLOFT) 50 MG tablet Take 100 mg by mouth daily.    Yes [provider]  traMADol (ULTRAM) 50 MG tablet Take 1 tablet (50 mg total) by mouth every 6 (six) hours as needed. Patient taking differently: Take 50 mg by mouth every 6 (six) hours as needed for moderate pain.  07/11/18  Yes Ward, Layla Maw, DO  cephALEXin (KEFLEX) 500 MG capsule Take 1 capsule (500 mg total) by mouth 2 (two) times daily. 07/11/18   Ward, Layla Maw, DO    Family History Family History  Problem Relation Age of Onset  . COPD Mother   . Bone cancer Father     Social History Social History   Tobacco Use  . Smoking status: Never Smoker  . Smokeless tobacco: Never Used  Substance Use Topics  . Alcohol use: No  . Drug use: No     Allergies   Fish allergy; Pepto-bismol [bismuth subsalicylate]; Cortisone; and Trichophyton   Review of Systems Review of Systems  All other systems reviewed and are negative.  Physical Exam Updated Vital Signs BP 104/80   Pulse (!) 112   Temp 98.4 F (36.9 C) (Oral)   Resp 20   SpO2 98%   Physical Exam Vitals signs and nursing note reviewed.  Constitutional:      Appearance: She is well-developed.  HENT:     Head: Normocephalic and atraumatic.  Cardiovascular:     Rate and Rhythm: Normal rate and regular rhythm.     Heart sounds: No murmur.  Pulmonary:     Effort: Pulmonary effort is normal. No respiratory distress.     Breath sounds: Normal breath sounds.  Abdominal:     Palpations: Abdomen is soft.     Tenderness: There is no abdominal tenderness. There is no guarding or rebound.  Musculoskeletal:     Comments: 2+ DP pulses bilaterally. There is mild swelling to the anterior right knee. Flexion extension is intact throughout the knee and his lower extremity. There is no hip tenderness to palpation. There is mild tenderness to palpation over the right anterior knee/patella.  Skin:    General: Skin is warm and dry.   Neurological:     Mental Status: She is alert and oriented to person, place, and time.  Psychiatric:        Behavior: Behavior normal.      ED Treatments / Results  Labs (all labs ordered are listed, but only abnormal results are displayed) Labs Reviewed - No data to display  EKG None  Radiology Dg Knee Complete 4 Views Right  Result Date: 08/24/2018 CLINICAL DATA:  Right knee pain after a fall today. EXAM: RIGHT KNEE - COMPLETE 4+ VIEW COMPARISON:  07/10/2018 FINDINGS: Degenerative changes in the right knee with lateral greater than medial compartment narrowing and associated osteophyte formation in all 3 compartments. No evidence of acute fracture or dislocation. No focal bone lesion or bone destruction. Bone cortex appears intact. No significant effusions. Soft tissues are unremarkable. IMPRESSION: Degenerative changes in the right knee. No acute bony abnormalities. Electronically Signed   By: Burman Nieves M.D.   On: 08/24/2018 20:43    Procedures Procedures (including critical care time)  Medications Ordered in ED Medications  acetaminophen (TYLENOL) tablet 650 mg (650 mg Oral Given 08/24/18 2101)     Initial Impression / Assessment and Plan / ED Course  I have reviewed the triage vital signs and the nursing notes.  Pertinent labs & imaging results that were available during my care of the patient were reviewed by me and considered in my medical decision making (see chart for details).        Patient here for evaluation of injuries following a mechanical fall. No evidence of significant head trauma on examination. She has mild tenderness to palpation over her right knee with preserved range of motion. Plain films are negative for fracture. She does have mild tachycardia on ED evaluation. On record review she has had multiple outpatient visits over the last several years with persistent similar tachycardia. Discussed with patient home care for knee contusion. She is  able to ambulate with a walker in the department without difficulty. Plan to discharge home with outpatient follow-up and return precautions.  Final Clinical Impressions(s) / ED Diagnoses   Final diagnoses:  Fall, initial encounter  Contusion of right knee, initial encounter    ED Discharge Orders    None       Tilden Fossa, MD 08/24/18 2127

## 2018-08-24 NOTE — ED Notes (Signed)
Pt ambulated from stretcher to exam room door with a walker. Pt's gait was steady.

## 2018-08-24 NOTE — ED Triage Notes (Signed)
Pt BIB [facility] from [place], mechanical fall today, denies LOC or hitting her head. Pt c/o right knee pain, denies head/neck/back pain. A&O x4.

## 2018-08-27 ENCOUNTER — Other Ambulatory Visit: Payer: Self-pay | Admitting: Family Medicine

## 2018-08-27 DIAGNOSIS — Z1231 Encounter for screening mammogram for malignant neoplasm of breast: Secondary | ICD-10-CM

## 2018-09-05 DIAGNOSIS — J9610 Chronic respiratory failure, unspecified whether with hypoxia or hypercapnia: Secondary | ICD-10-CM | POA: Diagnosis not present

## 2018-09-05 DIAGNOSIS — G4733 Obstructive sleep apnea (adult) (pediatric): Secondary | ICD-10-CM | POA: Diagnosis not present

## 2018-09-05 DIAGNOSIS — J45909 Unspecified asthma, uncomplicated: Secondary | ICD-10-CM | POA: Diagnosis not present

## 2018-09-05 DIAGNOSIS — R092 Respiratory arrest: Secondary | ICD-10-CM | POA: Diagnosis not present

## 2018-09-13 ENCOUNTER — Encounter: Payer: Self-pay | Admitting: Pulmonary Disease

## 2018-09-14 DIAGNOSIS — N390 Urinary tract infection, site not specified: Secondary | ICD-10-CM | POA: Diagnosis not present

## 2018-09-14 DIAGNOSIS — N3944 Nocturnal enuresis: Secondary | ICD-10-CM | POA: Diagnosis not present

## 2018-09-14 DIAGNOSIS — R350 Frequency of micturition: Secondary | ICD-10-CM | POA: Diagnosis not present

## 2018-09-14 DIAGNOSIS — N3941 Urge incontinence: Secondary | ICD-10-CM | POA: Diagnosis not present

## 2018-09-14 DIAGNOSIS — R351 Nocturia: Secondary | ICD-10-CM | POA: Diagnosis not present

## 2018-09-18 ENCOUNTER — Telehealth: Payer: Self-pay | Admitting: Pulmonary Disease

## 2018-09-18 ENCOUNTER — Ambulatory Visit: Payer: Self-pay | Admitting: Pulmonary Disease

## 2018-09-18 NOTE — Telephone Encounter (Signed)
ATC line rang busy we need a signed released form before any info given

## 2018-09-19 NOTE — Telephone Encounter (Signed)
Attempted to call x2 - line busy and unable to leave message.

## 2018-09-20 NOTE — Telephone Encounter (Signed)
Attempted to call pt's caregiver x2 but received busy tone and unable to leave a message. Will try back later.

## 2018-09-24 NOTE — Telephone Encounter (Signed)
ATC.Marland KitchenMarland KitchenLine still ringing busy.

## 2018-09-25 DIAGNOSIS — R351 Nocturia: Secondary | ICD-10-CM | POA: Diagnosis not present

## 2018-09-25 DIAGNOSIS — N390 Urinary tract infection, site not specified: Secondary | ICD-10-CM | POA: Diagnosis not present

## 2018-09-25 DIAGNOSIS — N3941 Urge incontinence: Secondary | ICD-10-CM | POA: Diagnosis not present

## 2018-09-25 NOTE — Telephone Encounter (Signed)
Attempted to call pt's new caregiver x2 but received a busy tone and unable to leave a message. Due to multiple attempts trying to reach, per triage protocol encounter will be closed.

## 2018-09-26 ENCOUNTER — Ambulatory Visit: Payer: Self-pay

## 2018-10-06 DIAGNOSIS — G4733 Obstructive sleep apnea (adult) (pediatric): Secondary | ICD-10-CM | POA: Diagnosis not present

## 2018-10-06 DIAGNOSIS — J45909 Unspecified asthma, uncomplicated: Secondary | ICD-10-CM | POA: Diagnosis not present

## 2018-10-06 DIAGNOSIS — J9610 Chronic respiratory failure, unspecified whether with hypoxia or hypercapnia: Secondary | ICD-10-CM | POA: Diagnosis not present

## 2018-10-06 DIAGNOSIS — R0902 Hypoxemia: Secondary | ICD-10-CM | POA: Diagnosis not present

## 2018-10-24 DIAGNOSIS — N3941 Urge incontinence: Secondary | ICD-10-CM | POA: Diagnosis not present

## 2018-10-24 DIAGNOSIS — R351 Nocturia: Secondary | ICD-10-CM | POA: Diagnosis not present

## 2018-11-01 ENCOUNTER — Ambulatory Visit: Payer: Medicare Other | Admitting: Podiatry

## 2018-11-01 ENCOUNTER — Encounter: Payer: Self-pay | Admitting: Podiatry

## 2018-11-01 ENCOUNTER — Other Ambulatory Visit: Payer: Self-pay

## 2018-11-01 DIAGNOSIS — R32 Unspecified urinary incontinence: Secondary | ICD-10-CM | POA: Insufficient documentation

## 2018-11-01 DIAGNOSIS — B351 Tinea unguium: Secondary | ICD-10-CM

## 2018-11-01 DIAGNOSIS — M79674 Pain in right toe(s): Secondary | ICD-10-CM

## 2018-11-01 DIAGNOSIS — E1149 Type 2 diabetes mellitus with other diabetic neurological complication: Secondary | ICD-10-CM | POA: Diagnosis not present

## 2018-11-01 DIAGNOSIS — E782 Mixed hyperlipidemia: Secondary | ICD-10-CM | POA: Insufficient documentation

## 2018-11-01 DIAGNOSIS — F39 Unspecified mood [affective] disorder: Secondary | ICD-10-CM | POA: Insufficient documentation

## 2018-11-01 DIAGNOSIS — M79675 Pain in left toe(s): Secondary | ICD-10-CM

## 2018-11-01 DIAGNOSIS — J4520 Mild intermittent asthma, uncomplicated: Secondary | ICD-10-CM | POA: Insufficient documentation

## 2018-11-01 DIAGNOSIS — L03032 Cellulitis of left toe: Secondary | ICD-10-CM

## 2018-11-01 MED ORDER — DOXYCYCLINE HYCLATE 100 MG PO TABS: 100.0000 mg | ORAL_TABLET | Freq: Two times a day (BID) | ORAL | 0 refills | Status: DC

## 2018-11-01 NOTE — Patient Instructions (Signed)

## 2018-11-05 DIAGNOSIS — G4733 Obstructive sleep apnea (adult) (pediatric): Secondary | ICD-10-CM | POA: Diagnosis not present

## 2018-11-05 DIAGNOSIS — J9610 Chronic respiratory failure, unspecified whether with hypoxia or hypercapnia: Secondary | ICD-10-CM | POA: Diagnosis not present

## 2018-11-05 DIAGNOSIS — J45909 Unspecified asthma, uncomplicated: Secondary | ICD-10-CM | POA: Diagnosis not present

## 2018-11-05 DIAGNOSIS — R0902 Hypoxemia: Secondary | ICD-10-CM | POA: Diagnosis not present

## 2018-11-07 ENCOUNTER — Telehealth: Payer: Self-pay | Admitting: *Deleted

## 2018-11-07 LAB — WOUND CULTURE
MICRO NUMBER:: 496586
SPECIMEN QUALITY:: ADEQUATE

## 2018-11-07 MED ORDER — CIPROFLOXACIN HCL 500 MG PO TABS: 500.0000 mg | ORAL_TABLET | Freq: Two times a day (BID) | ORAL | 0 refills | Status: DC

## 2018-11-07 NOTE — Telephone Encounter (Signed)
Received labs of 11/01/2018 wound culture.

## 2018-11-07 NOTE — Telephone Encounter (Signed)
I called pt's home phone and spoke with pt's caregiver - Annabelle Harman Cullins and informed of Dr. Gabriel Rung change of medication to Ciprofloxacin and to stop the doxycycline. Annabelle Harman states understanding and asked if she could shower pt before the appt tomorrow, and she was told to soak the foot.  I told Annabelle Harman to shower pt with the dressing on the toe, but if it came off during the shower that would be okay, because she would be soaking the foot after the shower, then redress the toe. Annabelle Harman states understanding.

## 2018-11-07 NOTE — Telephone Encounter (Signed)
can you switch her antibiotics to ciprofloxain 500mg  BID x 7 days? Thanks.

## 2018-11-08 ENCOUNTER — Encounter: Payer: Self-pay | Admitting: Podiatry

## 2018-11-08 ENCOUNTER — Other Ambulatory Visit: Payer: Self-pay

## 2018-11-08 ENCOUNTER — Ambulatory Visit: Payer: Medicare HMO | Admitting: Podiatry

## 2018-11-08 ENCOUNTER — Telehealth: Payer: Self-pay | Admitting: Podiatry

## 2018-11-08 ENCOUNTER — Ambulatory Visit: Payer: Self-pay

## 2018-11-08 VITALS — Temp 98.1°F

## 2018-11-08 DIAGNOSIS — L03032 Cellulitis of left toe: Secondary | ICD-10-CM

## 2018-11-08 DIAGNOSIS — B351 Tinea unguium: Secondary | ICD-10-CM

## 2018-11-08 NOTE — Telephone Encounter (Signed)
Left message informing Annabelle Harman - Caregiver, the ciprofloxacin had been called to the [facility] on 11/07/2018 at 10:17am to contact the pharmacy.

## 2018-11-08 NOTE — Telephone Encounter (Signed)
Patient caregiver called in and needs antibiotic sent to [facility] in [place] in [place] when possible. She was seen today but pharmacy stated it wasn't sent.

## 2018-11-09 ENCOUNTER — Telehealth: Payer: Self-pay | Admitting: *Deleted

## 2018-11-09 NOTE — Progress Notes (Signed)
Subjective:   Patient ID: Shelley [name], female   DOB: 53 y.o.   MRN: [id number]   HPI 53 year old female presents the office today with a caregiver for concerns of ingrown toenail, infection of the left big toe, lateral aspect which is been ongoing for greater than 1 week and she is noticed drainage over the last 2 days.  She states that she has noticed a scab overlying the nail as well.  She does note some redness around the area.  No pus.  No red streaks.  She is also asking the nails be trimmed today as they are thick elongated causing discomfort and she cannot trim herself.  She also has a cyst on the side of her right foot that does not hurt is been there for very long time.  No recent trauma or treatment.   Review of Systems  All other systems reviewed and are negative.  Past Medical History:  Diagnosis Date  . Anxiety   . Arthritis   . Asthma   . AVM (arteriovenous malformation) of colon   . Diabetes mellitus   . Diverticulosis   . GERD (gastroesophageal reflux disease)   . GI bleed 09/2015  . Hemorrhoids    Internal and external  . Hiatal hernia   . Hyperlipidemia   . Hypertension   . Mental retardation   . Pneumonia     Past Surgical History:  Procedure Laterality Date  . CHOLECYSTECTOMY     2006  . COLONOSCOPY N/A 02/06/2013   Procedure: COLONOSCOPY;  Surgeon: Theda BelfastPatrick D Hung, MD;  Location: [facility];  Service: Endoscopy;  Laterality: N/A;  . COLONOSCOPY N/A 07/31/2015   Procedure: COLONOSCOPY;  Surgeon: Jeani HawkingPatrick Hung, MD;  [facility];  Service: Endoscopy;  Laterality: N/A;  . ESOPHAGOGASTRODUODENOSCOPY N/A 02/06/2013   Procedure: ESOPHAGOGASTRODUODENOSCOPY (EGD);  Surgeon: Theda BelfastPatrick D Hung, MD;  [facility];  Service: Endoscopy;  Laterality: N/A;  . EYE SURGERY    . FLEXIBLE SIGMOIDOSCOPY N/A 09/18/2015   Procedure: FLEXIBLE SIGMOIDOSCOPY;  Surgeon: Jeani HawkingPatrick Hung, MD;  [facility];  Service: Endoscopy;  Laterality: N/A;  . HEMORRHOID  SURGERY N/A 05/06/2016   Procedure: HEMORRHOIDECTOMY, HEMORRHOID LIGATION;  Surgeon: Karie SodaSteven Gross, MD;  Location: [facility]S;  Service: General;  Laterality: N/A;  . HOT HEMOSTASIS N/A 07/31/2015   Procedure: HOT HEMOSTASIS (ARGON PLASMA COAGULATION/BICAP);  Surgeon: Jeani HawkingPatrick Hung, MD;  [facility];  Service: Endoscopy;  Laterality: N/A;     Current Outpatient Medications:  .  acetaminophen (TYLENOL) 500 MG tablet, Take 500 mg by mouth every 6 (six) hours as needed., Disp: , Rfl:  .  albuterol (PROVENTIL) (2.5 MG/3ML) 0.083% nebulizer solution, TAKE 3 MLS (2.5 MG TOTAL) BY NEBULIZATION EVERY 6 HOURS IF NEEDED FOR WHEEZING/SHORTNESS OF BREATH (Patient taking differently: Take 2.5 mg by nebulization every 6 (six) hours as needed for wheezing or shortness of breath. ), Disp: 360 mL, Rfl: 0 .  atorvastatin (LIPITOR) 10 MG tablet, Take 10 mg by mouth daily., Disp: , Rfl:  .  budesonide (PULMICORT) 0.25 MG/2ML nebulizer solution, Take 0.25 mg by nebulization 2 (two) times daily. , Disp: , Rfl:  .  ciprofloxacin (CIPRO) 500 MG tablet, Take 1 tablet (500 mg total) by mouth 2 (two) times daily., Disp: 14 tablet, Rfl: 0 .  doxycycline (VIBRA-TABS) 100 MG tablet, Take 1 tablet (100 mg total) by mouth 2 (two) times daily., Disp: 20 tablet, Rfl: 0 .  FERREX 150 150 MG capsule, Take 150 mg by mouth daily.,  Disp: , Rfl: 6 .  hydrochlorothiazide (HYDRODIURIL) 12.5 MG tablet, Take 12.5 mg by mouth every morning., Disp: , Rfl:  .  ipratropium-albuterol (DUONEB) 0.5-2.5 (3) MG/3ML SOLN, Take 3 mLs by nebulization every 6 (six) hours as needed (SOB/WHEEZING)., Disp: 360 mL, Rfl: 3 .  loratadine (CLARITIN) 10 MG tablet, Take 10 mg by mouth daily as needed for allergies. , Disp: , Rfl:  .  losartan (COZAAR) 100 MG tablet, Take 100 mg by mouth daily., Disp: , Rfl:  .  losartan-hydrochlorothiazide (HYZAAR) 100-12.5 MG tablet, Take 1 tablet by mouth daily. , Disp: , Rfl:  .  metFORMIN (GLUCOPHAGE) 500 MG tablet,  Take 1,000 mg by mouth every evening. , Disp: , Rfl:  .  montelukast (SINGULAIR) 10 MG tablet, Take 10 mg by mouth at bedtime., Disp: , Rfl:  .  MYRBETRIQ 25 MG TB24 tablet, Take 25 mg by mouth daily., Disp: , Rfl:  .  omeprazole (PRILOSEC) 40 MG capsule, Take 40 mg by mouth daily. , Disp: , Rfl:  .  Potassium Chloride ER 20 MEQ TBCR, Take 1 tablet by mouth daily. with food, Disp: , Rfl:  .  sertraline (ZOLOFT) 50 MG tablet, Take 100 mg by mouth daily. , Disp: , Rfl:  .  traMADol (ULTRAM) 50 MG tablet, Take 1 tablet (50 mg total) by mouth every 6 (six) hours as needed. (Patient taking differently: Take 50 mg by mouth every 6 (six) hours as needed for moderate pain. ), Disp: 15 tablet, Rfl: 0  Allergies  Allergen Reactions  . Fish Allergy Swelling  . Pepto-Bismol [Bismuth Subsalicylate] Nausea And Vomiting  . Cortisone Itching and Rash    Injections and cream only.  PO ok.  . Trichophyton Itching    Also sneezing accompanying as well         Objective:  Physical Exam  General: NAD  Dermatological: On the lateral aspect of the left hallux toenail there is incurvation there is localized edema and erythema to the nail border and small amount of purulence was identified.  There is no ascending cellulitis there is no fluctuation capitation.  There is tenderness palpation the nail border.  Overall nails are hypertrophic, dystrophic, discolored with yellow-brown discoloration.  The subjective tenderness nails 1-5 bilaterally Redness or drainage of the other toenail sites.  Vascular: Dorsalis Pedis artery and Posterior Tibial artery pedal pulses are 2/4 bilateral with immedate capillary fill time. There is no pain with calf compression, swelling, warmth, erythema.   Neruologic: Grossly intact via light touch bilateral.   Musculoskeletal: No gross boney pedal deformities bilateral. No pain, crepitus, or limitation noted with foot and ankle range of motion bilateral. Muscular strength 5/5 in all  groups tested bilateral.  Gait: Unassisted, Nonantalgic.       Assessment:   53 year old female left hallux paronychia, symptomatic onychomycosis     Plan:  -Treatment options discussed including all alternatives, risks, and complications -Etiology of symptoms were discussed -Nails are debrided x9 without any complications or bleeding. -At this time, recommended partial nail removal without chemical matricectomy to the left hallux aspect due to infection. Risks and complications were discussed with the patient for which they understand and  verbally consent to the procedure. Under sterile conditions a total of 3 mL of a mixture of 2% lidocaine plain and 0.5% Marcaine plain was infiltrated in a hallux block fashion. Once anesthetized, the skin was prepped in sterile fashion. A tourniquet was then applied. Next the latereal border of the hallux nail border  was sharply excised making sure to remove the entire offending nail border.  A wound culture was obtained.  Once the nail was  Removed, the area was debrided and the underlying skin was intact. The area was irrigated and hemostasis was obtained.  A dry sterile dressing was applied. After application of the dressing the tourniquet was removed and there is found to be an immediate capillary refill time to the digit. The patient tolerated the procedure well any complications. Post procedure instructions were discussed the patient for which he verbally understood. Follow-up in one week for nail check or sooner if any problems are to arise. Discussed signs/symptoms of worsening infection and directed to call the office immediately should any occur or go directly to the emergency room. In the meantime, encouraged to call the office with any questions, concerns, changes symptoms. -Doxycycline  RTC 1 week or sooner if needed for nail check  Vivi Barrack DPM

## 2018-11-09 NOTE — Telephone Encounter (Signed)
I informed Despina Hidden - [facility] statement that the medication order was received 11/07/2018 and was on the way to being delivered.

## 2018-11-09 NOTE — Telephone Encounter (Signed)
Shelley [name] states the new antibiotic is not at the pharmacy.

## 2018-11-09 NOTE — Telephone Encounter (Signed)
I reviewed Medications orders and there was confirmation ciprofloxacin orders had been received on 11/07/2018 at 10:17am and I informed Shelley [name] of this and I would call [facility] with the information and orders if not received.

## 2018-11-09 NOTE — Telephone Encounter (Signed)
[facility] - Olegario Messier states the ciprofloxacin had been received 11/07/2018 and was in the process of being delivered and she does not know why anyone would tell pt's caregiver it was not there.

## 2018-11-11 NOTE — Progress Notes (Signed)
Subjective: Shelley [name] is a 53 y.o.  female returns to office today for follow up evaluation after having left Hallux partial nail avulsion performed. Patient has been soaking using Epsom salts and applying topical antibiotic covered with bandaid daily.  She has not yet received the new antibiotic that was prescribed. Patient denies fevers, chills, nausea, vomiting. Denies any calf pain, chest pain, SOB.   Objective:  Vitals: Reviewed  General: NAD, with caregiver.   Dermatology: Skin is warm, dry and supple bilateral.  Left  hallux nail border appears to be clean, dry, with mild granular tissue and surrounding scab. There is no minimal surrounding erythema but no significant extending cellulitis, edema, drainage/purulence. The remaining nails appear unremarkable at this time. There are no other lesions or other signs of infection present.  Neurovascular status: Intact. No lower extremity swelling; No pain with calf compression bilateral.  Musculoskeletal: No tenderness to palpation of the left hallux nail folds. Muscular strength within normal limits bilateral.   Assesement and Plan: S/p partial nail avulsion, doing well.   -Continue soaking in epsom salts twice a day followed by antibiotic ointment and a band-aid. Can leave uncovered at night. Continue this until completely healed.  -If the area has not healed in 2 weeks, call the office for follow-up appointment, or sooner if any problems arise.  -Patient was antibiotics -Monitor for any signs/symptoms of infection. Call the office immediately if any occur or go directly to the emergency room. Call with any questions/concerns.  Ovid Curd, DPM

## 2018-11-21 DIAGNOSIS — S00451A Superficial foreign body of right ear, initial encounter: Secondary | ICD-10-CM | POA: Diagnosis not present

## 2018-11-21 DIAGNOSIS — S00452A Superficial foreign body of left ear, initial encounter: Secondary | ICD-10-CM | POA: Diagnosis not present

## 2018-11-22 ENCOUNTER — Ambulatory Visit: Payer: Medicare HMO | Admitting: Podiatry

## 2018-12-04 DIAGNOSIS — H5213 Myopia, bilateral: Secondary | ICD-10-CM | POA: Diagnosis not present

## 2018-12-04 DIAGNOSIS — H35033 Hypertensive retinopathy, bilateral: Secondary | ICD-10-CM | POA: Diagnosis not present

## 2018-12-04 DIAGNOSIS — E119 Type 2 diabetes mellitus without complications: Secondary | ICD-10-CM | POA: Diagnosis not present

## 2018-12-04 DIAGNOSIS — H52223 Regular astigmatism, bilateral: Secondary | ICD-10-CM | POA: Diagnosis not present

## 2018-12-04 DIAGNOSIS — H2513 Age-related nuclear cataract, bilateral: Secondary | ICD-10-CM | POA: Diagnosis not present

## 2018-12-04 DIAGNOSIS — H5032 Intermittent alternating esotropia: Secondary | ICD-10-CM | POA: Diagnosis not present

## 2018-12-04 DIAGNOSIS — H524 Presbyopia: Secondary | ICD-10-CM | POA: Diagnosis not present

## 2018-12-05 ENCOUNTER — Telehealth: Payer: Self-pay | Admitting: Pulmonary Disease

## 2018-12-05 NOTE — Telephone Encounter (Signed)
Called and spoke with Patient's caregiver Hinton Dyer. Hinton Dyer stated she has recently took over care for patient.  Hinton Dyer stated she is not sure os amount of O2 patient is to be on, nebs, or bipap.  Patient was last seen 11/2017, by VS.  Patient has mental issues per caregiver, and has problems with knowing what meds she is to be on.  Hinton Dyer stated DME is [facility], and she has had issues getting tanks filled. Hinton Dyer stated Patient is not using a bipap.  Hinton Dyer stated patient had neb meds, but they were all expired.  Hinton Dyer stated patient may need a new neb machine.  Hinton Dyer stated Patient experienced increased [facility] yesterday, while ambulating.  Hinton Dyer was unsure the amount of oxygen patient should be on. Per last OV- Patient is on 2 liters O2 during day, and 4 liters bipap at night.  Caregiver Hinton Dyer is coming with Patient for OV.  OV scheduled for 12/06/18, at 0930 with Beth,NP. Patient and caregiver covid screen questions negative. Nothing further at this time.

## 2018-12-06 ENCOUNTER — Ambulatory Visit: Payer: Medicare HMO | Admitting: Primary Care

## 2018-12-06 DIAGNOSIS — J45909 Unspecified asthma, uncomplicated: Secondary | ICD-10-CM | POA: Diagnosis not present

## 2018-12-06 DIAGNOSIS — R0902 Hypoxemia: Secondary | ICD-10-CM | POA: Diagnosis not present

## 2018-12-06 DIAGNOSIS — J9610 Chronic respiratory failure, unspecified whether with hypoxia or hypercapnia: Secondary | ICD-10-CM | POA: Diagnosis not present

## 2018-12-06 DIAGNOSIS — G4733 Obstructive sleep apnea (adult) (pediatric): Secondary | ICD-10-CM | POA: Diagnosis not present

## 2018-12-06 NOTE — Progress Notes (Deleted)
 @Patient  ID: Shelley PennaPenny A Beller, female    DOB: 02/21/1966, 53 y.o.   MRN: [id number]  No chief complaint on file.   Referring provider: Maurice SmallGriffin, Elaine, MD  HPI: 53 year old female, never smoked. PMH significant for OSA, obesity hypoventilation syndrome, moderate persistent asthma, chronic respiratory failure, HTN, type 2 diabetes, AVM, diverticulosis, GERD, hemorrhoids, hyperlipidemia, cognitive developmental delay, anxiety/depression, obesity. Patient of Dr. Craige CottaSood, last seen on 11/21/17. Difficulty with BiPAP compliance, uses 4L oxygen at night. Maintained on Pulmicort, Singulair and prn albuterol.   12/06/2018 Patient presents today for 1 year follow-up. Accompanied by her caregiver.     Sleep tests: PSG 09/13/10>>AHI 98.6, SpO2 60% ONO with CPAP and 3 liters 01/13/12>>Test time 7 hrs 6 min. Mean SpO2 89%, low SpO2 62%. Spent 2 hrs 34 min with SpO2 < 88% CPAP 12/16/11 to 01/14/12>>Used on 6 of 30 nights with average 7 hrs 3 min. Average AHI 1.8 with CPAP 10 cm H2O. ONO with CPAP and 3 liters 01/13/12 >>Test time 7 hrs 6 min. Mean SpO2 89%, low SpO2 62%. Spent 2 hrs 34 min with SpO2 <88%. ONO with CPAP and 4 liters 01/31/12 >>Test time 7 hrs 36 min. Mean SpO2 91.6%, low SpO2 73%. Spent 56 min with SpO2 <88%. BPAP 02/20/12>>17/11 cm H2O with 4 liters oxygen.  Allergies  Allergen Reactions  . Fish Allergy Swelling  . Pepto-Bismol [Bismuth Subsalicylate] Nausea And Vomiting  . Cortisone Itching and Rash    Injections and cream only.  PO ok.  . Trichophyton Itching    Also sneezing accompanying as well    Immunization History  Administered Date(s) Administered  . Influenza Split 03/07/2012, 03/13/2013, 03/07/2016, 02/21/2017  . Influenza Whole 03/14/2011  . Influenza,inj,Quad PF,6+ Mos 03/13/2014, 03/13/2015  . Pneumococcal-Unspecified 03/13/2010    Past Medical History:  Diagnosis Date  . Anxiety   . Arthritis   . Asthma   . AVM (arteriovenous malformation) of colon    . Diabetes mellitus   . Diverticulosis   . GERD (gastroesophageal reflux disease)   . GI bleed 09/2015  . Hemorrhoids    Internal and external  . Hiatal hernia   . Hyperlipidemia   . Hypertension   . Mental retardation   . Pneumonia     Tobacco History: Social History   Tobacco Use  Smoking Status Never Smoker  Smokeless Tobacco Never Used   Counseling given: Not Answered   Outpatient Medications Prior to Visit  Medication Sig Dispense Refill  . acetaminophen (TYLENOL) 500 MG tablet Take 500 mg by mouth every 6 (six) hours as needed.    Marland Kitchen. albuterol (PROVENTIL) (2.5 MG/3ML) 0.083% nebulizer solution TAKE 3 MLS (2.5 MG TOTAL) BY NEBULIZATION EVERY 6 HOURS IF NEEDED FOR WHEEZING/SHORTNESS OF BREATH (Patient taking differently: Take 2.5 mg by nebulization every 6 (six) hours as needed for wheezing or shortness of breath. ) 360 mL 0  . atorvastatin (LIPITOR) 10 MG tablet Take 10 mg by mouth daily.    . budesonide (PULMICORT) 0.25 MG/2ML nebulizer solution Take 0.25 mg by nebulization 2 (two) times daily.     . ciprofloxacin (CIPRO) 500 MG tablet Take 1 tablet (500 mg total) by mouth 2 (two) times daily. 14 tablet 0  . doxycycline (VIBRA-TABS) 100 MG tablet Take 1 tablet (100 mg total) by mouth 2 (two) times daily. 20 tablet 0  . FERREX 150 150 MG capsule Take 150 mg by mouth daily.  6  . hydrochlorothiazide (HYDRODIURIL) 12.5 MG tablet Take 12.5 mg by  mouth every morning.    Marland Kitchen ipratropium-albuterol (DUONEB) 0.5-2.5 (3) MG/3ML SOLN Take 3 mLs by nebulization every 6 (six) hours as needed (SOB/WHEEZING). 360 mL 3  . loratadine (CLARITIN) 10 MG tablet Take 10 mg by mouth daily as needed for allergies.     Marland Kitchen losartan (COZAAR) 100 MG tablet Take 100 mg by mouth daily.    Marland Kitchen losartan-hydrochlorothiazide (HYZAAR) 100-12.5 MG tablet Take 1 tablet by mouth daily.     . metFORMIN (GLUCOPHAGE) 500 MG tablet Take 1,000 mg by mouth every evening.     . montelukast (SINGULAIR) 10 MG tablet Take 10  mg by mouth at bedtime.    Marland Kitchen MYRBETRIQ 25 MG TB24 tablet Take 25 mg by mouth daily.    Marland Kitchen omeprazole (PRILOSEC) 40 MG capsule Take 40 mg by mouth daily.     . Potassium Chloride ER 20 MEQ TBCR Take 1 tablet by mouth daily. with food    . sertraline (ZOLOFT) 50 MG tablet Take 100 mg by mouth daily.     . traMADol (ULTRAM) 50 MG tablet Take 1 tablet (50 mg total) by mouth every 6 (six) hours as needed. (Patient taking differently: Take 50 mg by mouth every 6 (six) hours as needed for moderate pain. ) 15 tablet 0   No facility-administered medications prior to visit.       Review of Systems  Review of Systems   Physical Exam  There were no vitals taken for this visit. Physical Exam   Lab Results:  CBC    Component Value Date/Time   WBC 3.0 (L) 07/10/2018 2225   RBC 3.67 (L) 07/10/2018 2225   HGB 9.7 (L) 07/10/2018 2225   HCT 32.6 (L) 07/10/2018 2225   PLT 127 (L) 07/10/2018 2225   MCV 88.8 07/10/2018 2225   MCH 26.4 07/10/2018 2225   MCHC 29.8 (L) 07/10/2018 2225   RDW 13.6 07/10/2018 2225   LYMPHSABS 0.7 07/10/2018 2225   MONOABS 0.5 07/10/2018 2225   EOSABS 0.0 07/10/2018 2225   BASOSABS 0.0 07/10/2018 2225    BMET    Component Value Date/Time   NA 132 (L) 07/10/2018 2225   K 3.5 07/10/2018 2225   CL 85 (L) 07/10/2018 2225   CO2 39 (H) 07/10/2018 2225   GLUCOSE 291 (H) 07/10/2018 2225   BUN 14 07/10/2018 2225   CREATININE 0.80 07/10/2018 2225   CALCIUM 8.3 (L) 07/10/2018 2225   GFRNONAA >60 07/10/2018 2225   GFRAA >60 07/10/2018 2225    BNP No results found for: BNP  ProBNP    Component Value Date/Time   PROBNP 32.7 11/29/2011 2332    Imaging: No results found.   Assessment & Plan:   No problem-specific Assessment & Plan notes found for this encounter.     Martyn Ehrich, NP 12/06/2018

## 2018-12-07 NOTE — Progress Notes (Signed)
Virtual Visit via Telephone Note  I connected with Hansel Starling on 12/10/18 at 10:00 AM EDT by telephone and verified that I am speaking with the correct person using two identifiers.  Location: Patient: Writer: Office   I discussed the limitations, risks, security and privacy concerns of performing an evaluation and management service by telephone and the availability of in person appointments. I also discussed with the patient that there may be a patient responsible charge related to this service. The patient expressed understanding and agreed to proceed.  History of Present Illness: 53 year old female, never smoked. PMH significant for OSA, obesity hypoventilation syndrome, moderate persistent asthma, chronic respiratory failure, HTN, type 2 diabetes, AVM, diverticulosis, GERD, hemorrhoids, hyperlipidemia, cognitive developmental delay, anxiety/depression, obesity. Patient of Dr. Halford Chessman, last seen on 11/21/17. Difficulty with BiPAP compliance, uses 4L oxygen at night. Maintained on Pulmicort twice daily, Singulair and prn albuterol neb.   12/06/2018 Patient presents today for 1 year follow-up. Accompanied by her caregiver Hinton Dyer. Acute complaints of shortness of breath. Not currently using Pulmicort nebulizer as prescribed. Denies cough. She had low grade temperature today and was offered telephone visit. Associated anxiety and sweating which is typical for her. She has not been wearing her bipap. Caregiver is new and states patients nebulizers were all expired and needs clarification on medications and O2 use. She was using 2L on exertion and 4L with BIPAP during last visit. Needs new BIPAP machine missing pieces and dusty. She is wearing oxygen at night.   Sleep tests: PSG 09/13/10>>AHI 98.6, SpO2 60% ONO with CPAP and 3 liters 01/13/12>>Test time 7 hrs 6 min. Mean SpO2 89%, low SpO2 62%. Spent 2 hrs 34 min with SpO2 < 88% CPAP 12/16/11 to 01/14/12>>Used on 6 of 30 nights with average 7  hrs 3 min. Average AHI 1.8 with CPAP 10 cm H2O. ONO with CPAP and 3 liters 01/13/12 >>Test time 7 hrs 6 min. Mean SpO2 89%, low SpO2 62%. Spent 2 hrs 34 min with SpO2 <88%. ONO with CPAP and 4 liters 01/31/12 >>Test time 7 hrs 36 min. Mean SpO2 91.6%, low SpO2 73%. Spent 56 min with SpO2 <88%. BPAP 02/20/12>>17/11 cm H2O with 4 liters oxygen.    Observations/Objective:  - No obvious shortness of breath, wheezing or cough noted   Assessment and Plan:  OSA - Needs order for new CPAP machine - No pressure change - Continue 4L with BIPAP - FU in 6 weeks with download  Asthma: - Re-order Pulmicort twice daily; prn albuterol neb q 6 hours  - Needs order for new nebulizer machine   Low-grade temp - High risk, lives in group home - Needs COVID testing   Follow Up Instructions:  BIPAP compliance check in 6 weeks  FU in 6 months with Dr. Halford Chessman    I discussed the assessment and treatment plan with the patient. The patient was provided an opportunity to ask questions and all were answered. The patient agreed with the plan and demonstrated an understanding of the instructions.   The patient was advised to call back or seek an in-person evaluation if the symptoms worsen or if the condition fails to improve as anticipated.  I provided 20 minutes of non-face-to-face time during this encounter.   Martyn Ehrich, NP

## 2018-12-10 ENCOUNTER — Telehealth: Payer: Self-pay | Admitting: *Deleted

## 2018-12-10 ENCOUNTER — Ambulatory Visit (INDEPENDENT_AMBULATORY_CARE_PROVIDER_SITE_OTHER): Payer: Medicare HMO | Admitting: Primary Care

## 2018-12-10 ENCOUNTER — Encounter: Payer: Self-pay | Admitting: Primary Care

## 2018-12-10 ENCOUNTER — Other Ambulatory Visit: Payer: Self-pay

## 2018-12-10 DIAGNOSIS — J45909 Unspecified asthma, uncomplicated: Secondary | ICD-10-CM | POA: Diagnosis not present

## 2018-12-10 DIAGNOSIS — G4733 Obstructive sleep apnea (adult) (pediatric): Secondary | ICD-10-CM

## 2018-12-10 DIAGNOSIS — J4520 Mild intermittent asthma, uncomplicated: Secondary | ICD-10-CM | POA: Diagnosis not present

## 2018-12-10 DIAGNOSIS — R0902 Hypoxemia: Secondary | ICD-10-CM | POA: Diagnosis not present

## 2018-12-10 DIAGNOSIS — R69 Illness, unspecified: Secondary | ICD-10-CM | POA: Diagnosis not present

## 2018-12-10 DIAGNOSIS — Z20822 Contact with and (suspected) exposure to covid-19: Secondary | ICD-10-CM

## 2018-12-10 DIAGNOSIS — J45998 Other asthma: Secondary | ICD-10-CM | POA: Diagnosis not present

## 2018-12-10 DIAGNOSIS — J9610 Chronic respiratory failure, unspecified whether with hypoxia or hypercapnia: Secondary | ICD-10-CM | POA: Diagnosis not present

## 2018-12-10 MED ORDER — BUDESONIDE 0.25 MG/2ML IN SUSP: 0.2500 mg | Freq: Two times a day (BID) | RESPIRATORY_TRACT | 6 refills | Status: DC

## 2018-12-10 MED ORDER — ALBUTEROL SULFATE (2.5 MG/3ML) 0.083% IN NEBU: INHALATION_SOLUTION | RESPIRATORY_TRACT | 6 refills | Status: AC

## 2018-12-10 NOTE — Patient Instructions (Addendum)
COVID testing d/t low grade fever and shortness of breath   Use Pulmicort (budesonide) nebulizer twice a day scheduled (prescription sent to [facility])  Albuterol nebulizer every 6 hours as needed for breakthrough shortness of breath/wheezing  Bipap EVERY night with 4L oxygen (new machine ordered through DME company)  Use 2L oxygen on exertion   Follow up in 6 weeks with NP with BIPAP Download (can be televisit or OV if negative) Follow up in 6 months with Dr. Halford Chessman    CPAP and BPAP Information CPAP and BPAP are methods of helping a person breathe with the use of air pressure. CPAP stands for "continuous positive airway pressure." BPAP stands for "bi-level positive airway pressure." In both methods, air is blown through your nose or mouth and into your air passages to help you breathe well. CPAP and BPAP use different amounts of pressure to blow air. With CPAP, the amount of pressure stays the same while you breathe in and out. With BPAP, the amount of pressure is increased when you breathe in (inhale) so that you can take larger breaths. Your health care provider will recommend whether CPAP or BPAP would be more helpful for you. Why are CPAP and BPAP treatments used? CPAP or BPAP can be helpful if you have:  Sleep apnea.  Chronic obstructive pulmonary disease (COPD).  Heart failure.  Medical conditions that weaken the muscles of the chest including muscular dystrophy, or neurological diseases such as amyotrophic lateral sclerosis (ALS).  Other problems that cause breathing to be weak, abnormal, or difficult. CPAP is most commonly used for obstructive sleep apnea (OSA) to keep the airways from collapsing when the muscles relax during sleep. How is CPAP or BPAP administered? Both CPAP and BPAP are provided by a small machine with a flexible plastic tube that attaches to a plastic mask. You wear the mask. Air is blown through the mask into your nose or mouth. The amount of pressure that is  used to blow the air can be adjusted on the machine. Your health care provider will determine the pressure setting that should be used based on your individual needs. When should CPAP or BPAP be used? In most cases, the mask only needs to be worn during sleep. Generally, the mask needs to be worn throughout the night and during any daytime naps. People with certain medical conditions may also need to wear the mask at other times when they are awake. Follow instructions from your health care provider about when to use the machine. What are some tips for using the mask?   Because the mask needs to be snug, some people feel trapped or closed-in (claustrophobic) when first using the mask. If you feel this way, you may need to get used to the mask. One way to do this is by holding the mask loosely over your nose or mouth and then gradually applying the mask more snugly. You can also gradually increase the amount of time that you use the mask.  Masks are available in various types and sizes. Some fit over your mouth and nose while others fit over just your nose. If your mask does not fit well, talk with your health care provider about getting a different one.  If you are using a mask that fits over your nose and you tend to breathe through your mouth, a chin strap may be applied to help keep your mouth closed.  The CPAP and BPAP machines have alarms that may sound if the mask comes  off or develops a leak.  If you have trouble with the mask, it is very important that you talk with your health care provider about finding a way to make the mask easier to tolerate. Do not stop using the mask. Stopping the use of the mask could have a negative impact on your health. What are some tips for using the machine?  Place your CPAP or BPAP machine on a secure table or stand near an electrical outlet.  Know where the on/off switch is located on the machine.  Follow instructions from your health care provider about  how to set the pressure on your machine and when you should use it.  Do not eat or drink while the CPAP or BPAP machine is on. Food or fluids could get pushed into your lungs by the pressure of the CPAP or BPAP.  Do not smoke. Tobacco smoke residue can damage the machine.  For home use, CPAP and BPAP machines can be rented or purchased through home health care companies. Many different brands of machines are available. Renting a machine before purchasing may help you find out which particular machine works well for you.  Keep the CPAP or BPAP machine and attachments clean. Ask your health care provider for specific instructions. Get help right away if:  You have redness or open areas around your nose or mouth where the mask fits.  You have trouble using the CPAP or BPAP machine.  You cannot tolerate wearing the CPAP or BPAP mask.  You have pain, discomfort, and bloating in your abdomen. Summary  CPAP and BPAP are methods of helping a person breathe with the use of air pressure.  Both CPAP and BPAP are provided by a small machine with a flexible plastic tube that attaches to a plastic mask.  If you have trouble with the mask, it is very important that you talk with your health care provider about finding a way to make the mask easier to tolerate. This information is not intended to replace advice given to you by your health care provider. Make sure you discuss any questions you have with your health care provider. Document Released: 02/26/2004 Document Revised: 09/19/2018 Document Reviewed: 04/18/2016 Elsevier Patient Education  2020 [employer]

## 2018-12-10 NOTE — Telephone Encounter (Signed)
Covid test Received: Today Message Contents  Ratliff, Acie Fredrickson, CMA  [facility] [employer]        This patient needs a Covid test, due to low grade fever and she lives in a facility. Derl Barrow NP would like this test done as soon as possible. Thank   Spoke with pt's caregiver, Steffanie Dunn and pt was scheduled for testing on 12/11/18 at [facility] site. Advised caregiver that all occupants of the car will need to wear a mask and remain in car at app time. Understanding verbalized.

## 2018-12-11 ENCOUNTER — Other Ambulatory Visit: Payer: Self-pay

## 2018-12-11 DIAGNOSIS — Z20822 Contact with and (suspected) exposure to covid-19: Secondary | ICD-10-CM

## 2018-12-11 DIAGNOSIS — R6889 Other general symptoms and signs: Secondary | ICD-10-CM | POA: Diagnosis not present

## 2018-12-15 LAB — NOVEL CORONAVIRUS, NAA: SARS-CoV-2, NAA: NOT DETECTED

## 2018-12-20 ENCOUNTER — Other Ambulatory Visit: Payer: Self-pay

## 2018-12-20 ENCOUNTER — Ambulatory Visit
Admission: RE | Admit: 2018-12-20 | Discharge: 2018-12-20 | Disposition: A | Discharge status: Home / Self Care | Payer: Medicare HMO | Source: Ambulatory Visit | Attending: Family Medicine | Admitting: Family Medicine

## 2018-12-20 DIAGNOSIS — Z1231 Encounter for screening mammogram for malignant neoplasm of breast: Secondary | ICD-10-CM

## 2019-01-02 DIAGNOSIS — R69 Illness, unspecified: Secondary | ICD-10-CM | POA: Diagnosis not present

## 2019-01-05 DIAGNOSIS — R0902 Hypoxemia: Secondary | ICD-10-CM | POA: Diagnosis not present

## 2019-01-05 DIAGNOSIS — J45909 Unspecified asthma, uncomplicated: Secondary | ICD-10-CM | POA: Diagnosis not present

## 2019-01-05 DIAGNOSIS — J9610 Chronic respiratory failure, unspecified whether with hypoxia or hypercapnia: Secondary | ICD-10-CM | POA: Diagnosis not present

## 2019-01-05 DIAGNOSIS — G4733 Obstructive sleep apnea (adult) (pediatric): Secondary | ICD-10-CM | POA: Diagnosis not present

## 2019-01-09 ENCOUNTER — Other Ambulatory Visit: Payer: Self-pay

## 2019-01-09 ENCOUNTER — Encounter: Payer: Self-pay | Admitting: Podiatry

## 2019-01-09 ENCOUNTER — Ambulatory Visit: Payer: Medicare HMO | Admitting: Podiatry

## 2019-01-09 VITALS — Temp 97.3°F

## 2019-01-09 DIAGNOSIS — M79674 Pain in right toe(s): Secondary | ICD-10-CM

## 2019-01-09 DIAGNOSIS — B351 Tinea unguium: Secondary | ICD-10-CM | POA: Diagnosis not present

## 2019-01-09 DIAGNOSIS — M79675 Pain in left toe(s): Secondary | ICD-10-CM | POA: Diagnosis not present

## 2019-01-09 NOTE — Patient Instructions (Addendum)
Diabetes Mellitus and Foot Care Foot care is an important part of your health, especially when you have diabetes. Diabetes may cause you to have problems because of poor blood flow (circulation) to your feet and legs, which can cause your skin to:  Become thinner and drier.  Break more easily.  Heal more slowly.  Peel and crack. You may also have nerve damage (neuropathy) in your legs and feet, causing decreased feeling in them. This means that you may not notice minor injuries to your feet that could lead to more serious problems. Noticing and addressing any potential problems early is the best way to prevent future foot problems. How to care for your feet Foot hygiene  Wash your feet daily with warm water and mild soap. Do not use hot water. Then, pat your feet and the areas between your toes until they are completely dry. Do not soak your feet as this can dry your skin.  Trim your toenails straight across. Do not dig under them or around the cuticle. File the edges of your nails with an emery board or nail file.  Apply a moisturizing lotion or petroleum jelly to the skin on your feet and to dry, brittle toenails. Use lotion that does not contain alcohol and is unscented. Do not apply lotion between your toes. Shoes and socks  Wear clean socks or stockings every day. Make sure they are not too tight. Do not wear knee-high stockings since they may decrease blood flow to your legs.  Wear shoes that fit properly and have enough cushioning. Always look in your shoes before you put them on to be sure there are no objects inside.  To break in new shoes, wear them for just a few hours a day. This prevents injuries on your feet. Wounds, scrapes, corns, and calluses  Check your feet daily for blisters, cuts, bruises, sores, and redness. If you cannot see the bottom of your feet, use a mirror or ask someone for help.  Do not cut corns or calluses or try to remove them with medicine.  If you  find a minor scrape, cut, or break in the skin on your feet, keep it and the skin around it clean and dry. You may clean these areas with mild soap and water. Do not clean the area with peroxide, alcohol, or iodine.  If you have a wound, scrape, corn, or callus on your foot, look at it several times a day to make sure it is healing and not infected. Check for: ? Redness, swelling, or pain. ? Fluid or blood. ? Warmth. ? Pus or a bad smell. General instructions  Do not cross your legs. This may decrease blood flow to your feet.  Do not use heating pads or hot water bottles on your feet. They may burn your skin. If you have lost feeling in your feet or legs, you may not know this is happening until it is too late.  Protect your feet from hot and cold by wearing shoes, such as at the beach or on hot pavement.  Schedule a complete foot exam at least once a year (annually) or more often if you have foot problems. If you have foot problems, report any cuts, sores, or bruises to your health care provider immediately. Contact a health care provider if:  You have a medical condition that increases your risk of infection and you have any cuts, sores, or bruises on your feet.  You have an injury that is not   healing.  You have redness on your legs or feet.  You feel burning or tingling in your legs or feet.  You have pain or cramps in your legs and feet.  Your legs or feet are numb.  Your feet always feel cold.  You have pain around a toenail. Get help right away if:  You have a wound, scrape, corn, or callus on your foot and: ? You have pain, swelling, or redness that gets worse. ? You have fluid or blood coming from the wound, scrape, corn, or callus. ? Your wound, scrape, corn, or callus feels warm to the touch. ? You have pus or a bad smell coming from the wound, scrape, corn, or callus. ? You have a fever. ? You have a red line going up your leg. Summary  Check your feet every day  for cuts, sores, red spots, swelling, and blisters.  Moisturize feet and legs daily.  Wear shoes that fit properly and have enough cushioning.  If you have foot problems, report any cuts, sores, or bruises to your health care provider immediately.  Schedule a complete foot exam at least once a year (annually) or more often if you have foot problems. This information is not intended to replace advice given to you by your health care provider. Make sure you discuss any questions you have with your health care provider. Document Released: 05/27/2000 Document Revised: 07/12/2017 Document Reviewed: 07/01/2016 Elsevier Patient Education  2020 Elsevier Inc.   Onychomycosis/Fungal Toenails  WHAT IS IT? An infection that lies within the keratin of your nail plate that is caused by a fungus.  WHY ME? Fungal infections affect all ages, sexes, races, and creeds.  There may be many factors that predispose you to a fungal infection such as age, coexisting medical conditions such as diabetes, or an autoimmune disease; stress, medications, fatigue, genetics, etc.  Bottom line: fungus thrives in a warm, moist environment and your shoes offer such a location.  IS IT CONTAGIOUS? Theoretically, yes.  You do not want to share shoes, nail clippers or files with someone who has fungal toenails.  Walking around barefoot in the same room or sleeping in the same bed is unlikely to transfer the organism.  It is important to realize, however, that fungus can spread easily from one nail to the next on the same foot.  HOW DO WE TREAT THIS?  There are several ways to treat this condition.  Treatment may depend on many factors such as age, medications, pregnancy, liver and kidney conditions, etc.  It is best to ask your doctor which options are available to you.  1. No treatment.   Unlike many other medical concerns, you can live with this condition.  However for many people this can be a painful condition and may lead to  ingrown toenails or a bacterial infection.  It is recommended that you keep the nails cut short to help reduce the amount of fungal nail. 2. Topical treatment.  These range from herbal remedies to prescription strength nail lacquers.  About 40-50% effective, topicals require twice daily application for approximately 9 to 12 months or until an entirely new nail has grown out.  The most effective topicals are medical grade medications available through physicians offices. 3. Oral antifungal medications.  With an 80-90% cure rate, the most common oral medication requires 3 to 4 months of therapy and stays in your system for a year as the new nail grows out.  Oral antifungal medications do require   blood work to make sure it is a safe drug for you.  A liver function panel will be performed prior to starting the medication and after the first month of treatment.  It is important to have the blood work performed to avoid any harmful side effects.  In general, this medication safe but blood work is required. 4. Laser Therapy.  This treatment is performed by applying a specialized laser to the affected nail plate.  This therapy is noninvasive, fast, and non-painful.  It is not covered by insurance and is therefore, out of pocket.  The results have been very good with a 80-95% cure rate.  [facility] is the only practice in the area to offer this therapy. 5. Permanent Nail Avulsion.  Removing the entire nail so that a new nail will not grow back. 

## 2019-01-10 NOTE — Progress Notes (Signed)
Subjective:  Shelley [name] Shelley [name] presents to clinic today with cc of  painful, thick, discolored, elongated toenails 1-5 b/l that become tender and cannot cut because of thickness. Pain is aggravated when wearing enclosed shoe gear.  Maurice SmallGriffin, Elaine, MD is her PCP.   She is accompanied by her caregiver on today's visit.   Current Outpatient Medications:  .  acetaminophen (TYLENOL) 500 MG tablet, Take 500 mg by mouth every 6 (six) hours as needed., Disp: , Rfl:  .  albuterol (PROVENTIL) (2.5 MG/3ML) 0.083% nebulizer solution, TAKE 3 MLS (2.5 MG TOTAL) BY NEBULIZATION EVERY 6 HOURS IF NEEDED FOR WHEEZING/SHORTNESS OF BREATH, Disp: 360 mL, Rfl: 6 .  atorvastatin (LIPITOR) 10 MG tablet, Take 10 mg by mouth daily., Disp: , Rfl:  .  budesonide (PULMICORT) 0.25 MG/2ML nebulizer solution, Take 2 mLs (0.25 mg total) by nebulization 2 (two) times daily., Disp: 60 mL, Rfl: 6 .  FERREX 150 150 MG capsule, Take 150 mg by mouth daily., Disp: , Rfl: 6 .  hydrochlorothiazide (HYDRODIURIL) 12.5 MG tablet, Take 12.5 mg by mouth every morning., Disp: , Rfl:  .  loratadine (CLARITIN) 10 MG tablet, Take 10 mg by mouth daily as needed for allergies. , Disp: , Rfl:  .  losartan (COZAAR) 100 MG tablet, Take 100 mg by mouth daily., Disp: , Rfl:  .  losartan-hydrochlorothiazide (HYZAAR) 100-12.5 MG tablet, Take 1 tablet by mouth daily. , Disp: , Rfl:  .  metFORMIN (GLUCOPHAGE) 500 MG tablet, Take 1,000 mg by mouth every evening. , Disp: , Rfl:  .  montelukast (SINGULAIR) 10 MG tablet, Take 10 mg by mouth at bedtime., Disp: , Rfl:  .  MYRBETRIQ 25 MG TB24 tablet, Take 25 mg by mouth daily., Disp: , Rfl:  .  omeprazole (PRILOSEC) 40 MG capsule, Take 40 mg by mouth daily. , Disp: , Rfl:  .  Potassium Chloride ER 20 MEQ TBCR, Take 1 tablet by mouth daily. with food, Disp: , Rfl:  .  potassium chloride SA (K-DUR) 20 MEQ tablet, Take 20 mEq by mouth daily. with food, Disp: , Rfl:  .  sertraline (ZOLOFT) 100 MG tablet, Take 200  mg by mouth daily., Disp: , Rfl:  .  sertraline (ZOLOFT) 50 MG tablet, Take 100 mg by mouth daily. , Disp: , Rfl:  .  traMADol (ULTRAM) 50 MG tablet, Take 1 tablet (50 mg total) by mouth every 6 (six) hours as needed. (Patient taking differently: Take 50 mg by mouth every 6 (six) hours as needed for moderate pain. ), Disp: 15 tablet, Rfl: 0   Allergies  Allergen Reactions  . Fish Allergy Swelling  . Pepto-Bismol [Bismuth Subsalicylate] Nausea And Vomiting  . Cortisone Itching and Rash    Injections and cream only.  PO ok.  . Trichophyton Itching    Also sneezing accompanying as well     Objective: Vitals:   01/09/19 0936  Temp: (!) 97.3 F (36.3 C)    Physical Examination:  Vascular Examination: Capillary refill time immediate x 10 digits.  Palpable DP/PT pulses b/l.  Digital hair present b/l.  No edema noted b/l.  Skin temperature gradient WNL b/l.  Dermatological Examination: Skin with normal turgor, texture and tone b/l.  No open wounds b/l.  No interdigital macerations noted b/l.  Elongated, thick, discolored brittle toenails with subungual debris and pain on dorsal palpation of nailbeds 1-5 b/l. Incurvated nailplate b/l great toes with tenderness to palpation. No erythema, no edema, no drainage noted.  Musculoskeletal Examination:  Muscle strength 5/5 to all muscle groups b/l.  No pain, crepitus or joint discomfort with active/passive ROM.  Neurological Examination: Sensation intact 5/5 b/l with 10 gram monofilament.  Vibratory sensation intact b/l.  Proprioceptive sensation intact b/l.  Assessment: Mycotic nail infection with pain 1-5 b/l  Plan: 1. Toenails 1-5 b/l were debrided in length and girth without iatrogenic laceration. Patient allowed minimal debridement of b/l great toenails. Advised caregiver to monitor great toes for any acute changes. 2.  Continue soft, supportive shoe gear daily. 3.  Report any pedal injuries to medical  professional. 4.  Follow up 9 weeks. 5.  Patient/POA to call should there be Shelley question/concern in there interim.

## 2019-01-23 DIAGNOSIS — R351 Nocturia: Secondary | ICD-10-CM | POA: Diagnosis not present

## 2019-01-23 DIAGNOSIS — N3944 Nocturnal enuresis: Secondary | ICD-10-CM | POA: Diagnosis not present

## 2019-02-01 NOTE — Progress Notes (Signed)
Reviewed and agree with assessment/plan.   Sarenity Ramaker, MD [facility] 06/08/2016, 12:24 PM Pager:  [phone]  

## 2019-02-05 DIAGNOSIS — R0902 Hypoxemia: Secondary | ICD-10-CM | POA: Diagnosis not present

## 2019-02-05 DIAGNOSIS — J45909 Unspecified asthma, uncomplicated: Secondary | ICD-10-CM | POA: Diagnosis not present

## 2019-02-05 DIAGNOSIS — G4733 Obstructive sleep apnea (adult) (pediatric): Secondary | ICD-10-CM | POA: Diagnosis not present

## 2019-02-05 DIAGNOSIS — J9610 Chronic respiratory failure, unspecified whether with hypoxia or hypercapnia: Secondary | ICD-10-CM | POA: Diagnosis not present

## 2019-02-11 ENCOUNTER — Telehealth: Payer: Self-pay | Admitting: Pulmonary Disease

## 2019-02-11 DIAGNOSIS — Z9981 Dependence on supplemental oxygen: Secondary | ICD-10-CM

## 2019-02-11 NOTE — Telephone Encounter (Signed)
Spoke with Shelley [name] and advised her that I would ask VS about a portable concentrator. I stated that she may have to come in to qualify and that there are some companies that don't have them right now. She doesn't have the portable continuous tanks and stated that they could work with that just so she can go out of town. VS please advise if we could write order for pt.

## 2019-02-11 NOTE — Telephone Encounter (Signed)
It is after 5:00 PM EDT. Will hold in Triage to follow-up on tomorrow.

## 2019-02-11 NOTE — Telephone Encounter (Signed)
Okay to place order to assess for portable oxygen concentrator.

## 2019-02-12 NOTE — Telephone Encounter (Signed)
Spoke with [employer] and I was asking them did pt use oxygen at night with Bipap? They stated they would call me back with more information. She is requesting a portable tank and I am trying to figure out what type of portable tanks.POC to give her. Her caretaker passed away and another person is trying to help her and she doesn't know much about what pt needs. Will await a return call.

## 2019-02-13 NOTE — Telephone Encounter (Signed)
Hinton Dyer is returning phone call.  Hinton Dyer phone number is [phone].

## 2019-02-13 NOTE — Telephone Encounter (Signed)
Left message for Dana to call back

## 2019-02-14 NOTE — Telephone Encounter (Signed)
Dana Cullins returning call.  [phone].  Pt's caregiver trying to find out about getting portable oxygen for an upcoming trip.  Pt has upcoming appt.  Really needed to speak with someone.

## 2019-02-14 NOTE — Telephone Encounter (Signed)
Spoke with Shelley [name] and advised her to call the number below to receive service in the area that they are traveling and need oxygen. She understood and I placed the order for her to receive the smaller tanks since they don't have the small POC's available right now. Nothing further is needed.    [email or web address]  [phone]

## 2019-02-15 ENCOUNTER — Telehealth: Payer: Self-pay | Admitting: Pulmonary Disease

## 2019-02-15 NOTE — Telephone Encounter (Signed)
Spoke with Shelley [name], she stated that she gave the pt's caregiver two websites to go to for travel oxygen. This is the only way to receive travel oxygen and they must go to one of these websites. Nothing further is needed.  [email or web address]  [email or web address]

## 2019-02-20 ENCOUNTER — Other Ambulatory Visit: Payer: Self-pay

## 2019-02-20 ENCOUNTER — Ambulatory Visit (INDEPENDENT_AMBULATORY_CARE_PROVIDER_SITE_OTHER): Payer: Medicare HMO | Admitting: Pulmonary Disease

## 2019-02-20 ENCOUNTER — Encounter: Payer: Self-pay | Admitting: Pulmonary Disease

## 2019-02-20 VITALS — BP 118/68 | HR 67 | Ht 66.0 in | Wt 223.4 lb

## 2019-02-20 DIAGNOSIS — J9611 Chronic respiratory failure with hypoxia: Secondary | ICD-10-CM

## 2019-02-20 DIAGNOSIS — E662 Morbid (severe) obesity with alveolar hypoventilation: Secondary | ICD-10-CM | POA: Diagnosis not present

## 2019-02-20 DIAGNOSIS — G4733 Obstructive sleep apnea (adult) (pediatric): Secondary | ICD-10-CM

## 2019-02-20 DIAGNOSIS — Z23 Encounter for immunization: Secondary | ICD-10-CM

## 2019-02-20 NOTE — Progress Notes (Signed)
[facility]  Chief Complaint  Patient presents with  . Follow-up    bipap is broke, trying to get appt to get it fixed,DME [facility]    Constitutional:  BP 118/68 (BP Location: Right Arm, Cuff Size: Normal)   Pulse 67   Ht 5\' 6"  (1.676 m)   Wt 223 lb 6.4 oz (101.3 kg)   SpO2 93%   BMI 36.06 kg/m   Past Medical History:  PNA, Mental retardation, HTN, Hiatal hernia, GERD, Diverticulosis, GI bleed from colon AVM, DM, OA, Anxiety  Brief Summary:  Shelley [name] is a 53 y.o. female with OSA, OHS, and asthma.  She is with her new caregiver.  She hasn't been using Bipap consistently.  Her machine broke, and she is waiting to hear back from [facility].  Also hasn't been using oxygen consistently either.  She has lost about 22 lbs since I last saw her in 2019.  She has made adjustments to her diet.  She wasn't sure how much oxygen she needed to use.  She hasn't been coughing.  Not having sinus congestion, sore throat, or dry mouth.  Denies cough, wheeze, or chest congestion.  She hasn't been using pulmicort recently.    Physical Exam:   Appearance - well kempt   ENMT - clear nasal mucosa, midline nasal  septum, no oral exudates, no LAN, trachea midline  Respiratory - normal chest wall, normal respiratory effort, no accessory muscle use, no wheeze/rales  CV - s1s2 regular rate and rhythm, no murmurs, no peripheral edema, radial pulses symmetric  GI - soft, non tender, no masses  Lymph - no adenopathy noted in neck and axillary areas  MSK - normal gait  Ext - no cyanosis, clubbing, or joint inflammation noted  Skin - no rashes, lesions, or ulcers  Neuro - normal strength, oriented x 3  Psych - normal mood and affect   Assessment/Plan:   Obstructive sleep apnea. - her machine broke and she is waiting to hear back from [facility] to get new Bipap machine - she has been on 17/11 cm H2O Bipap; might be able to decrease pressure settings if she  continues to lose weight  Chronic respiratory failure with hypoxia from OHS. - improved with weight loss - can use 2 liters oxygen 24/7, including with Bipap at night  Persistent asthma. - singulair daily with prn albuterol - discussed indications for when she should use albuterol - don't think she needs pulmicort at this time - flu shot today  Obesity. - she has made significant progress with weight loss in the past year - encouraged her to keep up with weight loss efforts  Goals of care. - DNR/DNI   Patient Instructions  Continue using singulair 10 mg pill nightly Can you albuterol one vial nebulized every 6 hours as needed for cough, wheeze, chest congestion, or shortness of breath Call if you are having trouble getting a new Bipap machine Use 2 liters oxygen 24/7 Goal is to keep your oxygen level above 90%; make sure you aren't wearing nail polish on fingernail used to check oxygen level Will arrange for portable oxygen concentrator Flu shot today  Follow up in 4 months    Coralyn HellingVineet Shauna Bodkins, MD [facility] Pager: [phone] 02/20/2019, 11:42 AM  Flow Sheet    Sleep tests:  PSG 09/13/10>>AHI 98.6, SpO2 60% ONO with CPAP and 3 liters 01/13/12>>Test time 7 hrs 6 min. Mean SpO2 89%, low SpO2 62%. Spent 2 hrs 34  min with SpO2 < 88% CPAP 12/16/11 to 01/14/12>>Used on 6 of 30 nights with average 7 hrs 3 min. Average AHI 1.8 with CPAP 10 cm H2O. ONO with CPAP and 3 liters 01/13/12 >>Test time 7 hrs 6 min. Mean SpO2 89%, low SpO2 62%. Spent 2 hrs 34 min with SpO2 <88%. ONO with CPAP and 4 liters 01/31/12 >>Test time 7 hrs 36 min. Mean SpO2 91.6%, low SpO2 73%. Spent 56 min with SpO2 <88%. BPAP 02/20/12>>17/11 cm H2O with 4 liters oxygen.  Medications:   Allergies as of 02/20/2019      Reactions   Fish Allergy Swelling   Pepto-bismol [bismuth Subsalicylate] Nausea And Vomiting   Cortisone Itching, Rash   Injections and cream only.  PO ok.    Trichophyton Itching   Also sneezing accompanying as well      Medication List       Accurate as of February 20, 2019 11:42 AM. If you have any questions, ask your nurse or doctor.        STOP taking these medications   budesonide 0.25 MG/2ML nebulizer solution Commonly known as: PULMICORT Stopped by: Chesley Mires, MD     TAKE these medications   acetaminophen 500 MG tablet Commonly known as: TYLENOL Take 500 mg by mouth every 6 (six) hours as needed.   albuterol (2.5 MG/3ML) 0.083% nebulizer solution Commonly known as: PROVENTIL TAKE 3 MLS (2.5 MG TOTAL) BY NEBULIZATION EVERY 6 HOURS IF NEEDED FOR WHEEZING/SHORTNESS OF BREATH   atorvastatin 10 MG tablet Commonly known as: LIPITOR Take 10 mg by mouth daily.   Ferrex 150 150 MG capsule Generic drug: iron polysaccharides Take 150 mg by mouth daily.   hydrochlorothiazide 12.5 MG tablet Commonly known as: HYDRODIURIL Take 12.5 mg by mouth every morning.   loratadine 10 MG tablet Commonly known as: CLARITIN Take 10 mg by mouth daily as needed for allergies.   losartan 100 MG tablet Commonly known as: COZAAR Take 100 mg by mouth daily.   losartan-hydrochlorothiazide 100-12.5 MG tablet Commonly known as: HYZAAR Take 1 tablet by mouth daily.   metFORMIN 500 MG tablet Commonly known as: GLUCOPHAGE Take 1,000 mg by mouth every evening.   montelukast 10 MG tablet Commonly known as: SINGULAIR Take 10 mg by mouth at bedtime.   Myrbetriq 25 MG Tb24 tablet Generic drug: mirabegron ER Take 25 mg by mouth daily.   omeprazole 40 MG capsule Commonly known as: PRILOSEC Take 40 mg by mouth daily.   Potassium Chloride ER 20 MEQ Tbcr Take 1 tablet by mouth daily. with food   potassium chloride SA 20 MEQ tablet Commonly known as: K-DUR Take 20 mEq by mouth daily. with food   sertraline 100 MG tablet Commonly known as: ZOLOFT Take 200 mg by mouth daily.   sertraline 50 MG tablet Commonly known as: ZOLOFT Take 100  mg by mouth daily.   traMADol 50 MG tablet Commonly known as: ULTRAM Take 1 tablet (50 mg total) by mouth every 6 (six) hours as needed. What changed: reasons to take this       Past Surgical History:  She  has a past surgical history that includes Cholecystectomy; Eye surgery; Colonoscopy (N/A, 02/06/2013); Esophagogastroduodenoscopy (N/A, 02/06/2013); Colonoscopy (N/A, 07/31/2015); Hot hemostasis (N/A, 07/31/2015); Flexible sigmoidoscopy (N/A, 09/18/2015); and Hemorrhoid surgery (N/A, 05/06/2016).  Family History:  Her family history includes Bone cancer in her father; COPD in her mother.  Social History:  She  reports that she has never smoked. She has never  used smokeless tobacco. She reports that she does not drink alcohol or use drugs.

## 2019-02-20 NOTE — Patient Instructions (Signed)
Continue using singulair 10 mg pill nightly Can you albuterol one vial nebulized every 6 hours as needed for cough, wheeze, chest congestion, or shortness of breath Call if you are having trouble getting a new Bipap machine Use 2 liters oxygen 24/7 Goal is to keep your oxygen level above 90%; make sure you aren't wearing nail polish on fingernail used to check oxygen level Will arrange for portable oxygen concentrator Flu shot today  Follow up in 4 months

## 2019-02-21 ENCOUNTER — Telehealth: Payer: Self-pay | Admitting: Pulmonary Disease

## 2019-02-21 NOTE — Telephone Encounter (Signed)
Called Angela back from [employer] and she stated the patient has missed two scheduled appointments for the bipap machine. Patient caregiver made them aware they patient is not using the bipap machine as there are pieces missing from it.  In addition [facility] made the patient aware they do not have any POC's available, she was given the web site information again to contact to make a direct purchase. Patient was told it would be an out of pocket cost. This is the same information that was given to the patient by our office 02/11/19.   Levada Dy stated the order sent by our office 02/21/19 was already submitted, no need to reenter it. In addition, until the patient keeps her appointment with them on 02/26/19, they are not able to complete the original order for the bipap with bleed in O2 use.   The patient told [employer] she was going out of town on 03/01/19 and the tank she has does not fit into the car. However, the patient can go to one of their locations and pay out of pocket for a O2 cylinder which would be $15 a piece.   Info kept in triage as FYI.

## 2019-02-21 NOTE — Telephone Encounter (Signed)
Called back [employer], spoke with [place]. Levada Dy was on another call and could not pick up.  I asked him to let her know the patient had an OV 02/20/19 with Dr. Halford Chessman, to take a look at that note and the note dated 02/15/19 regarding O2 for travel use. That may help her with what she was looking for, and if not to give [place] a call back.

## 2019-02-26 DIAGNOSIS — J4520 Mild intermittent asthma, uncomplicated: Secondary | ICD-10-CM | POA: Diagnosis not present

## 2019-02-26 DIAGNOSIS — J9610 Chronic respiratory failure, unspecified whether with hypoxia or hypercapnia: Secondary | ICD-10-CM | POA: Diagnosis not present

## 2019-02-26 DIAGNOSIS — G4733 Obstructive sleep apnea (adult) (pediatric): Secondary | ICD-10-CM | POA: Diagnosis not present

## 2019-02-26 DIAGNOSIS — J45909 Unspecified asthma, uncomplicated: Secondary | ICD-10-CM | POA: Diagnosis not present

## 2019-02-26 DIAGNOSIS — R0902 Hypoxemia: Secondary | ICD-10-CM | POA: Diagnosis not present

## 2019-03-06 IMAGING — CR DG FEMUR 2+V*R*
5 series · 5 of 5 positions shown · non-contrast
Comparison: None.

CLINICAL DATA: Right knee pain.

EXAM:
RIGHT FEMUR 2 VIEWS

[t femur proximal ap right]
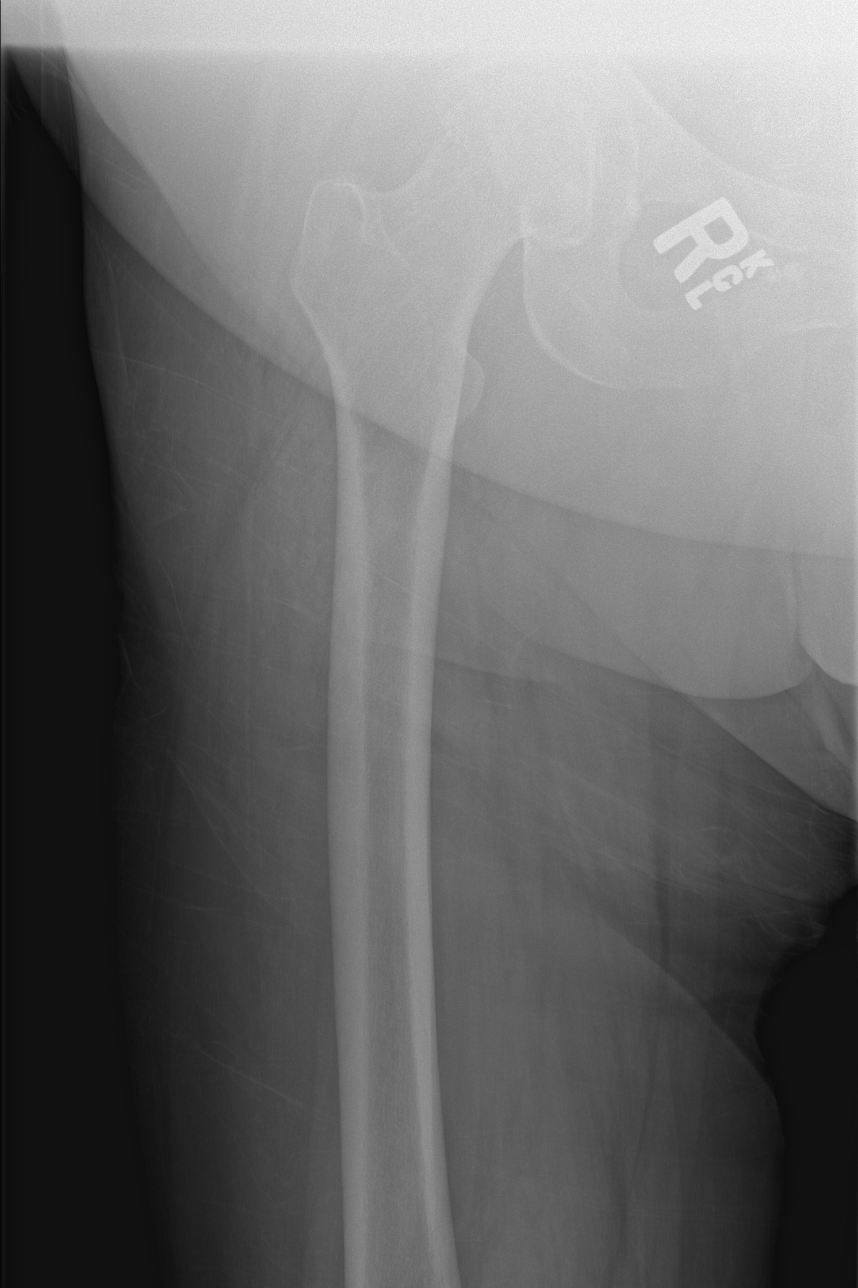

[t femur distal ap right]
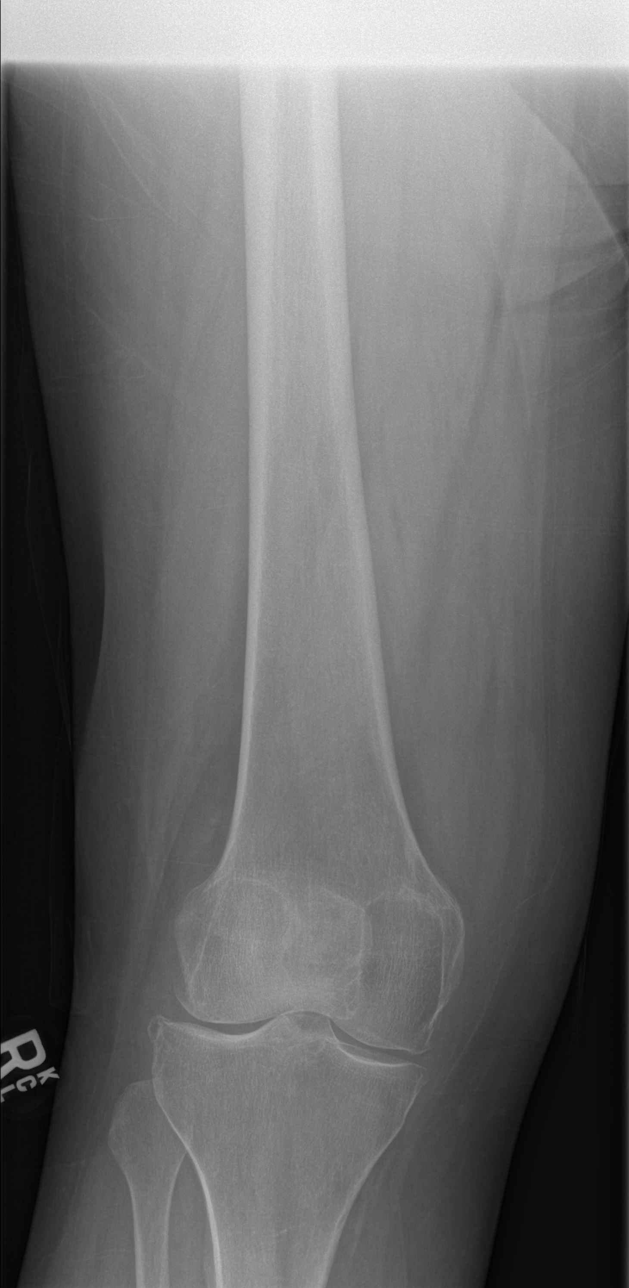

[t femur proximal lat right (1 of 2)]
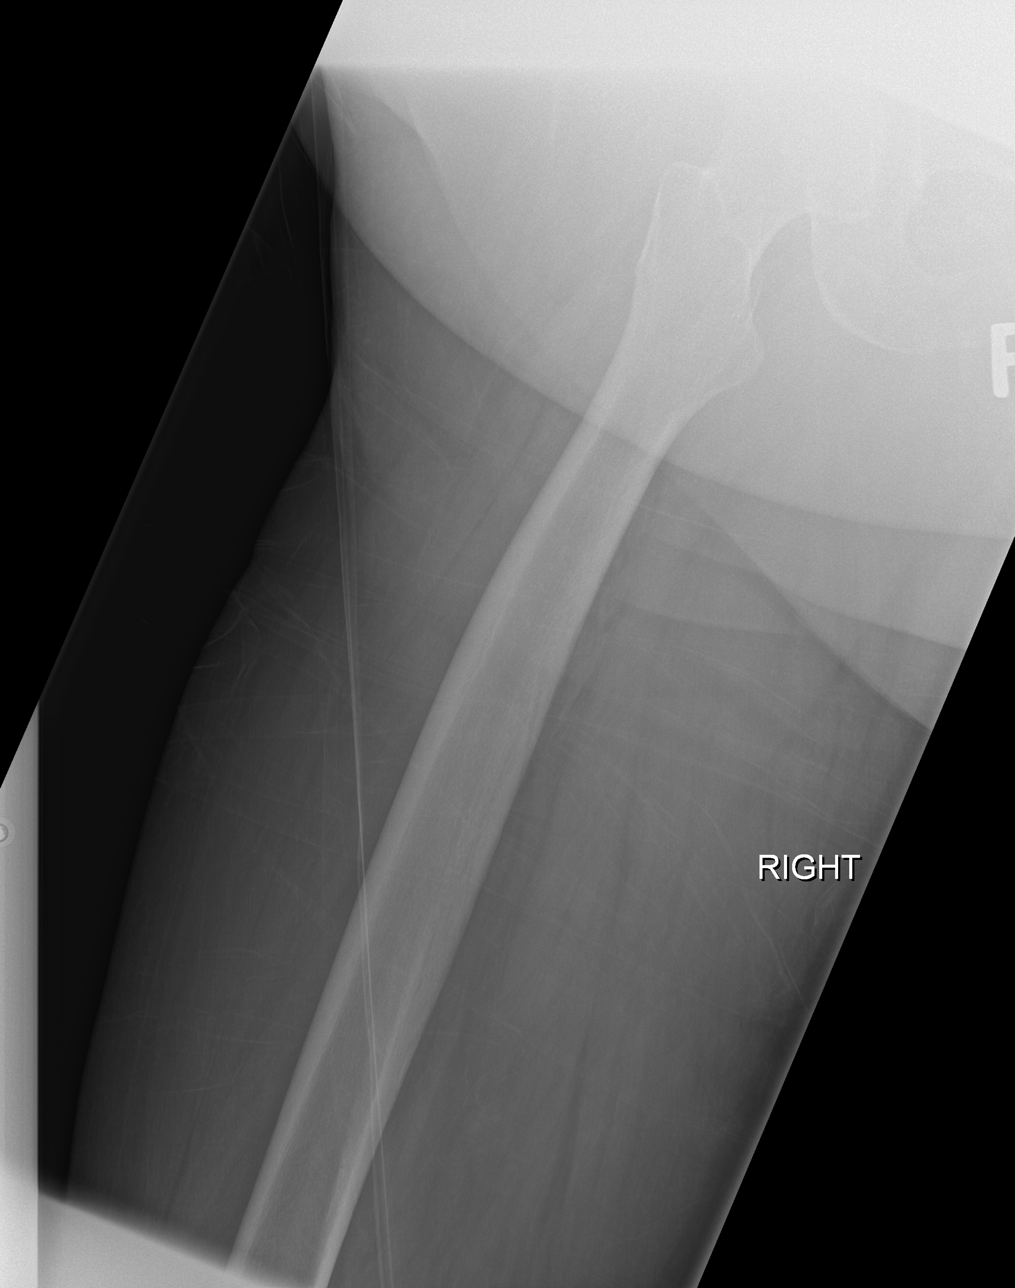

[t femur proximal lat right (2 of 2)]
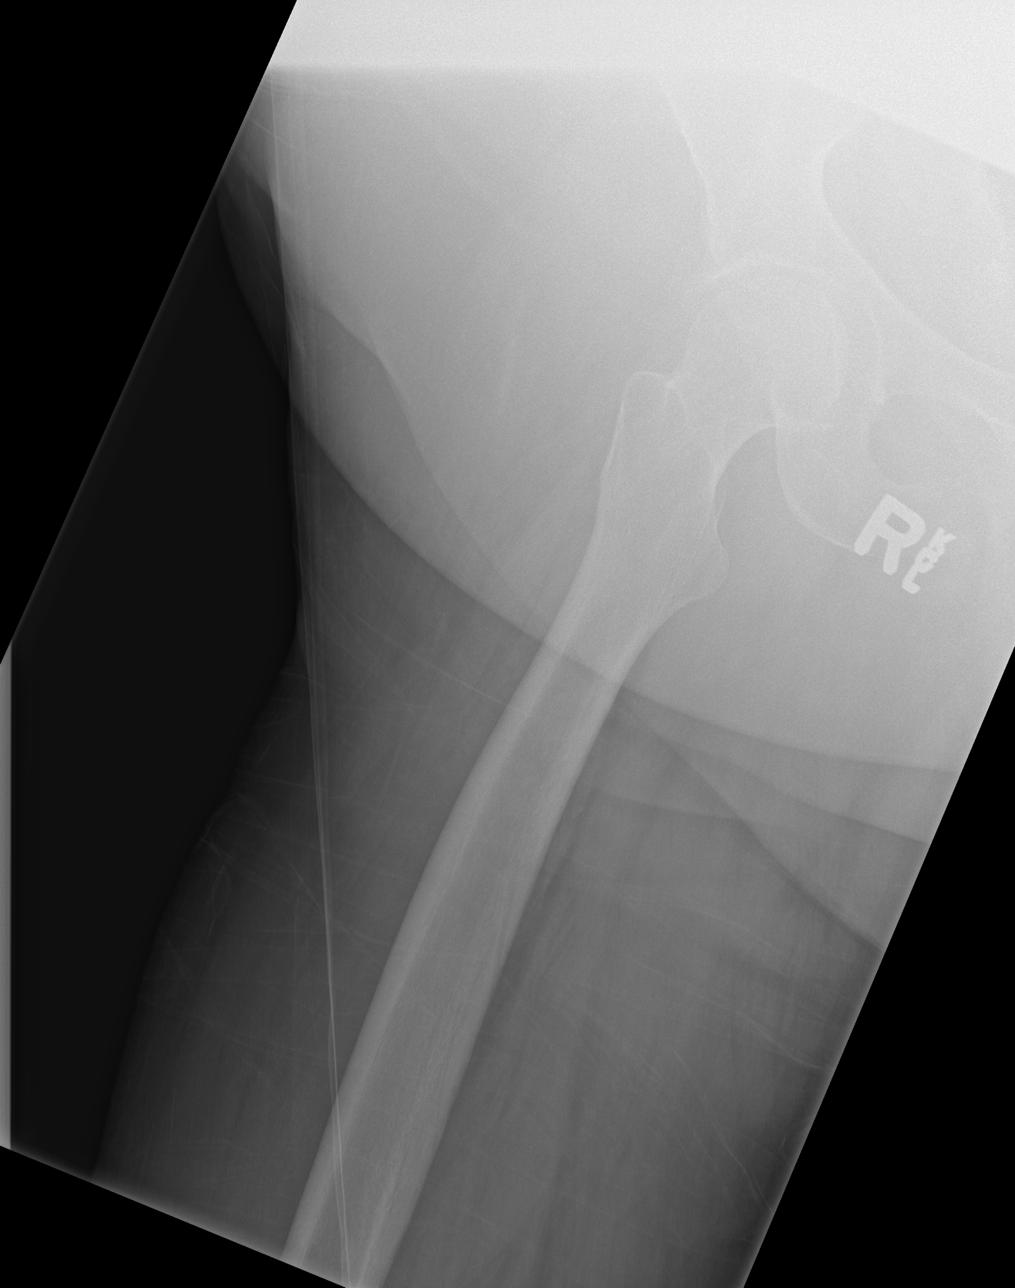

[x femur distal lat right]
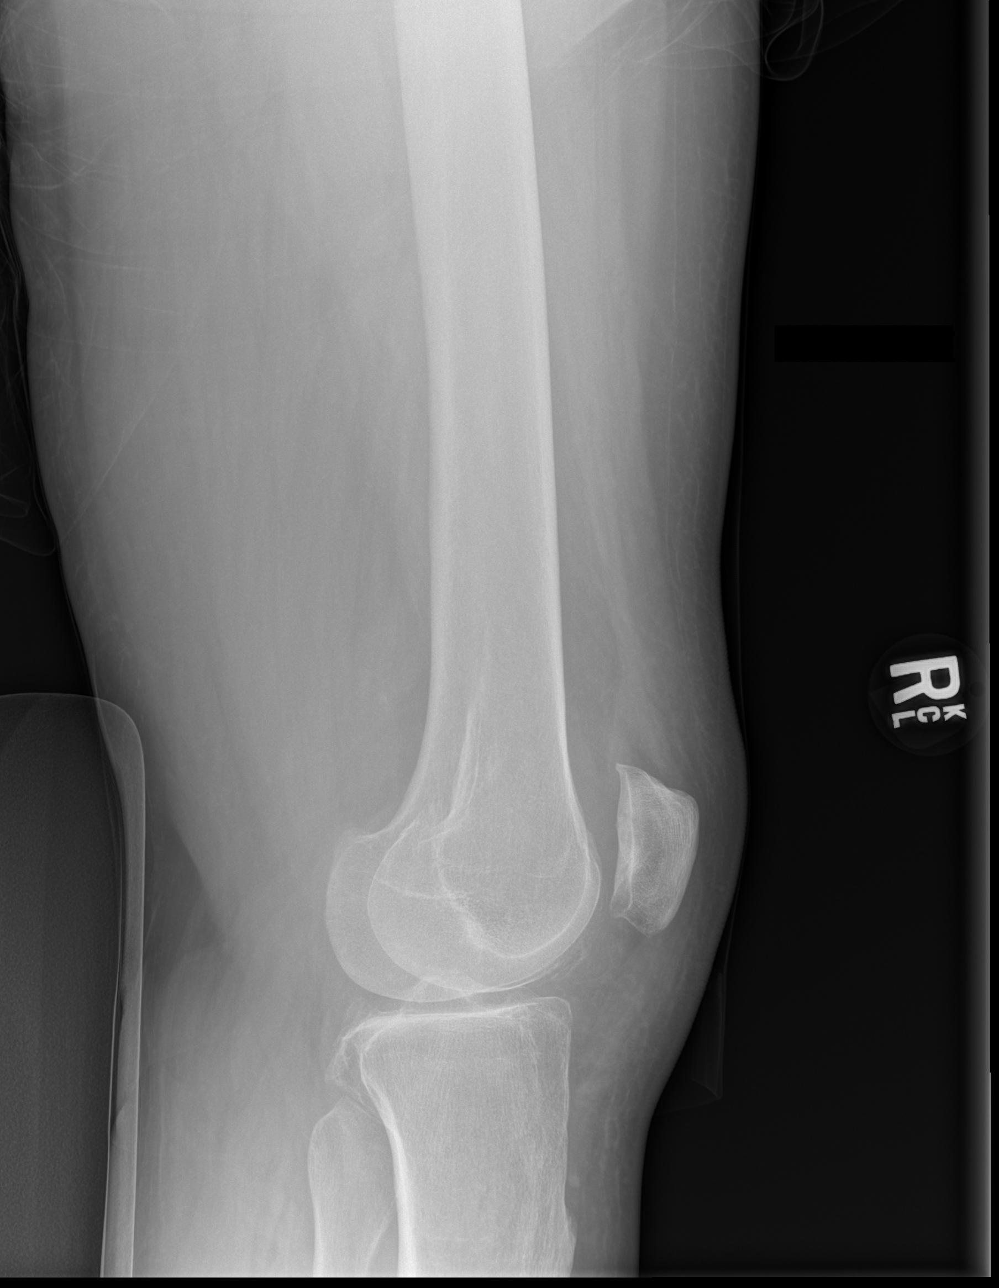

[5 of 5 positions shown; findings below may reference images not displayed]

FINDINGS: No evidence for femur fracture. No worrisome lytic or sclerotic
osseous abnormality. Degenerative changes are noted at the knee.
IMPRESSION: Degenerative changes at the knee without acute bony abnormality in
the femur.

## 2019-03-06 IMAGING — CR DG KNEE COMPLETE 4+V*R*
4 series · 4 of 4 positions shown · non-contrast
Comparison: None.

CLINICAL DATA: Right knee pain

EXAM:
RIGHT KNEE - COMPLETE 4+ VIEW

[t knee obl right (1 of 2)]
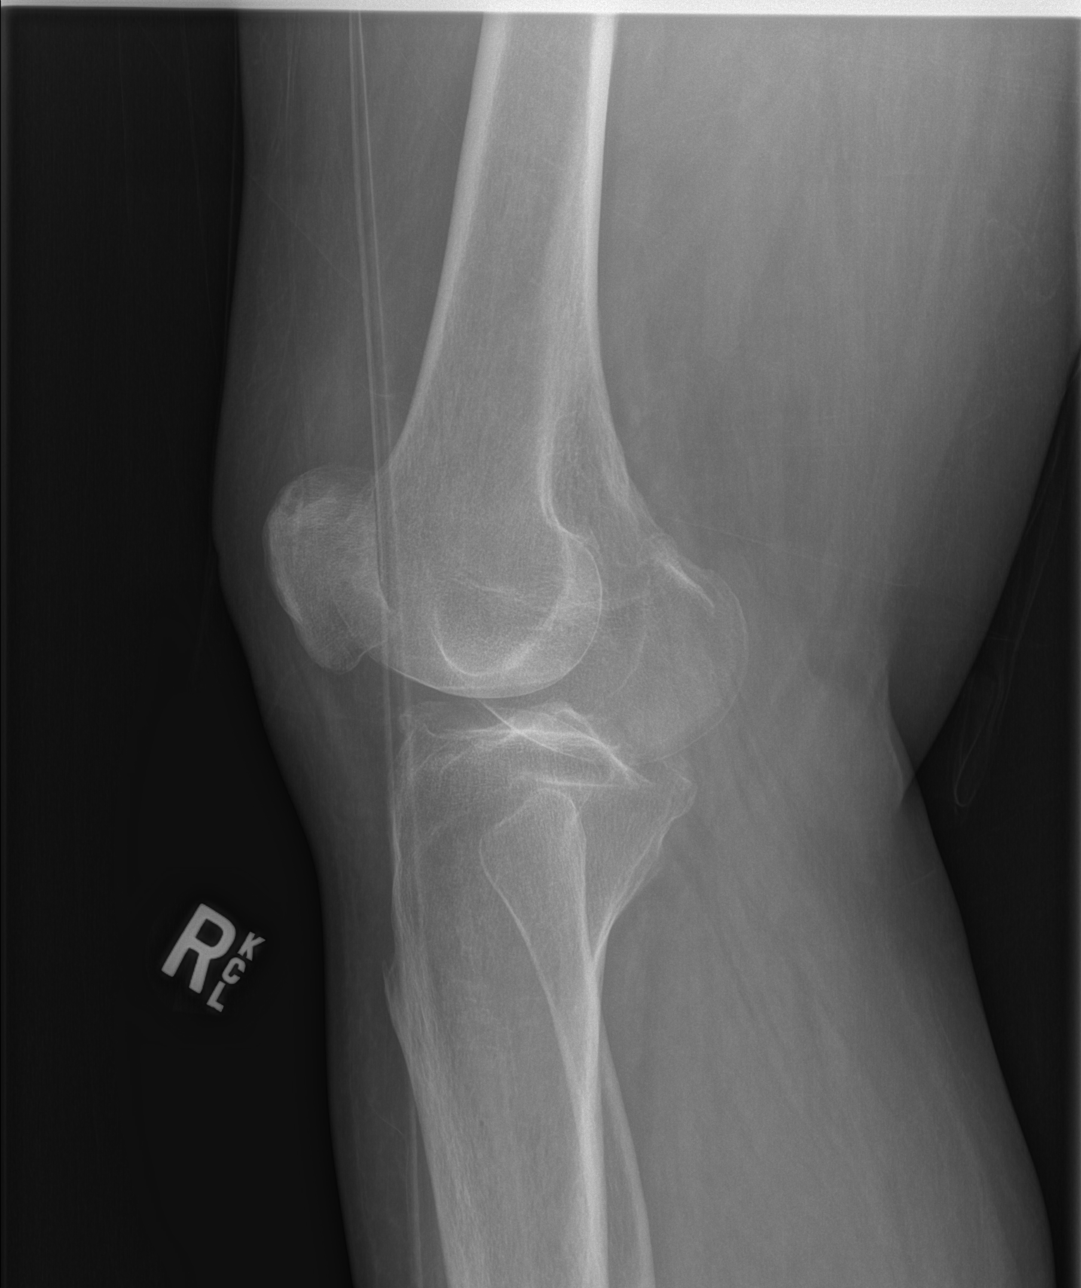

[t knee ap right]
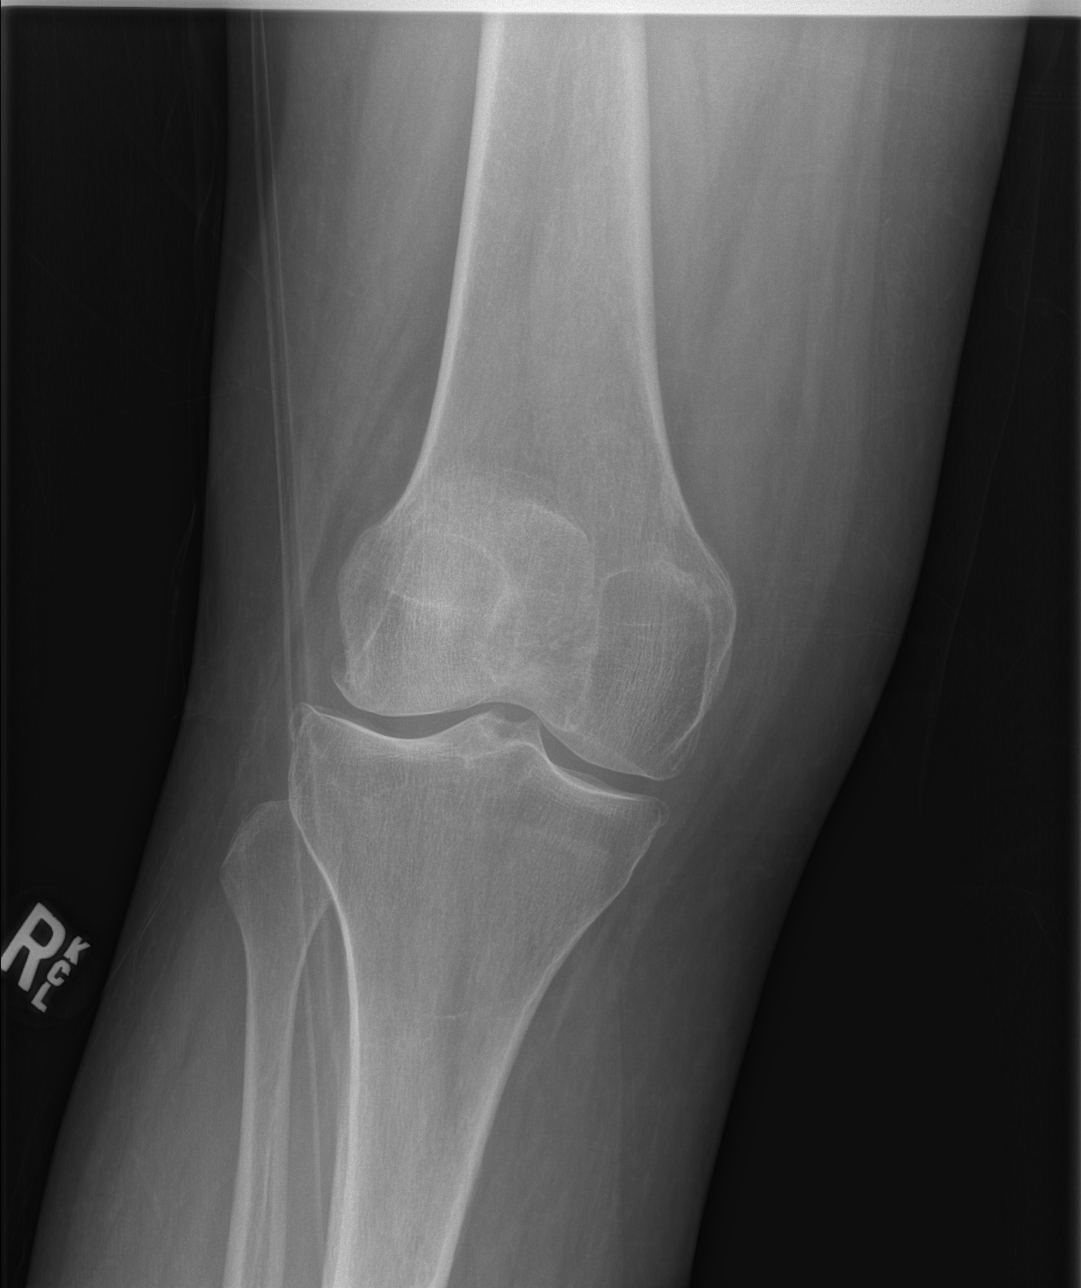

[t knee obl right (2 of 2)]
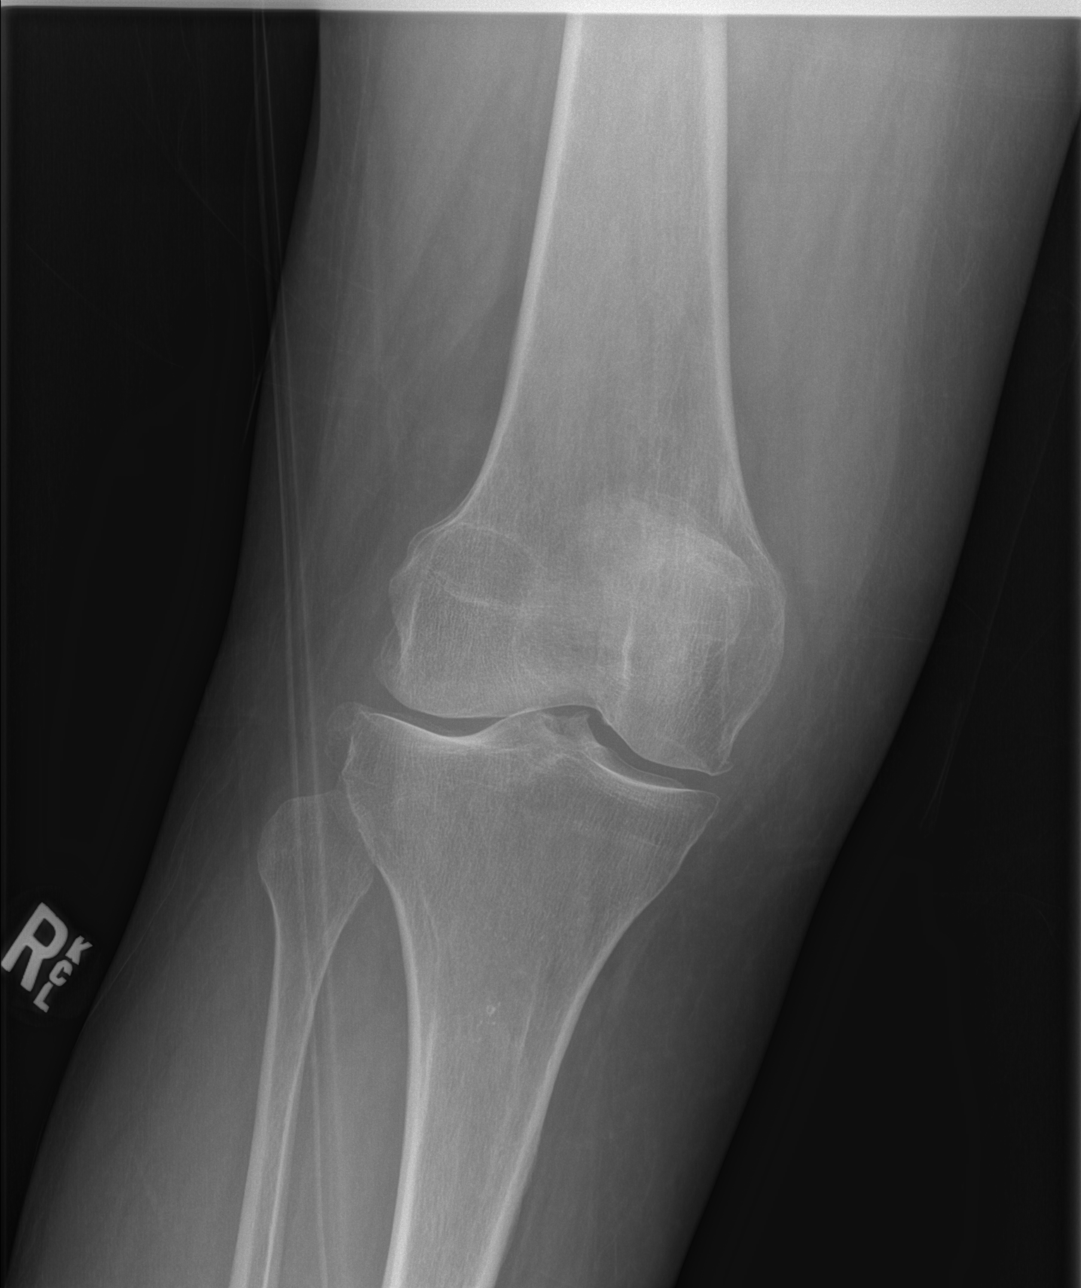

[x knee lat right]
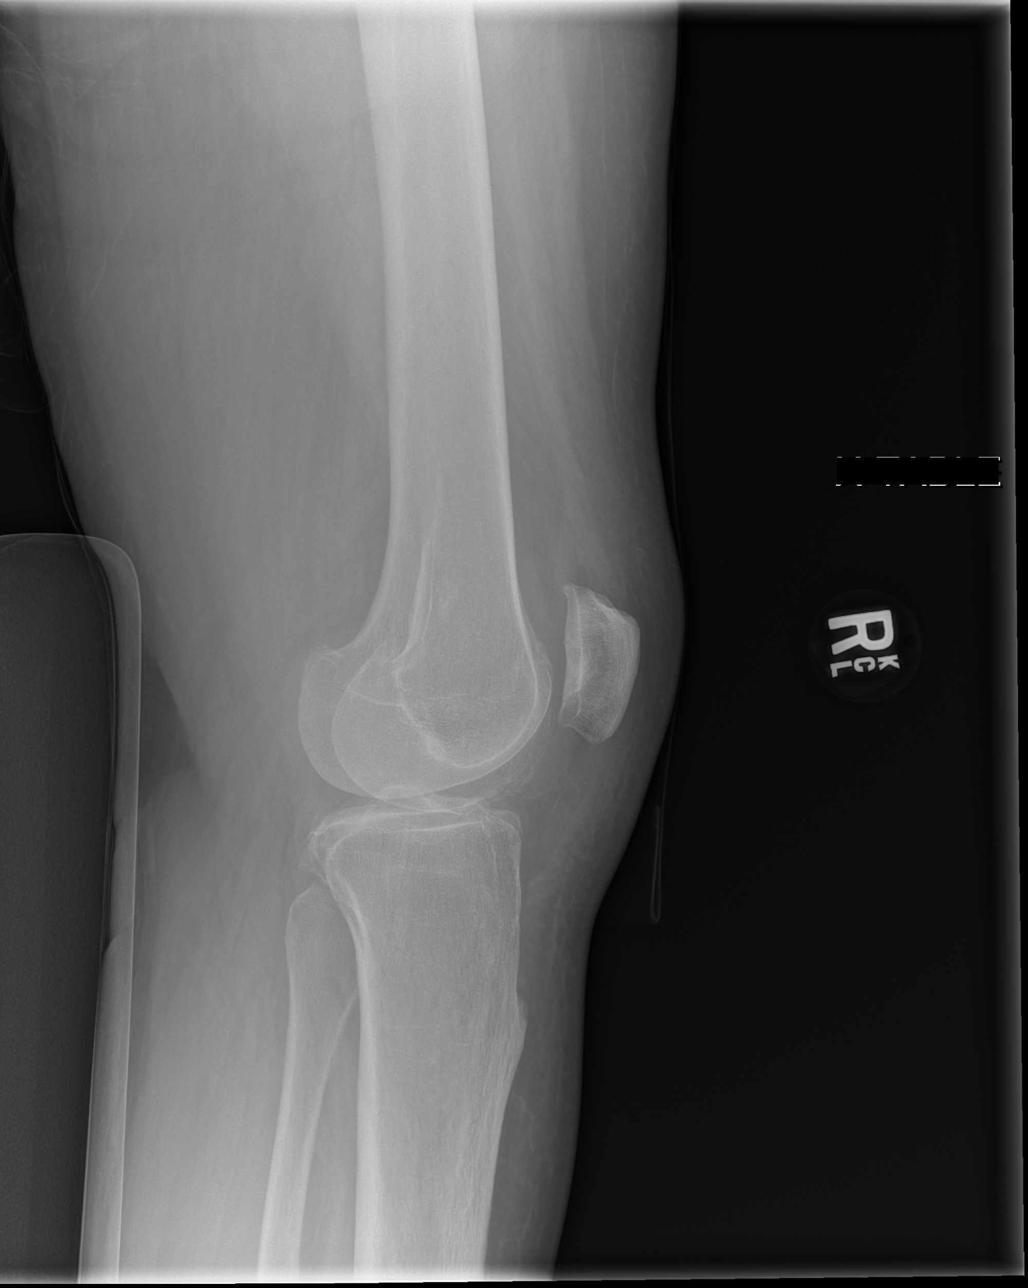

[4 of 4 positions shown; findings below may reference images not displayed]

FINDINGS: No fracture. No subluxation or dislocation. Degenerative spurring is
visible in all 3 compartments. Bones are diffusely demineralized.
IMPRESSION: Tricompartmental degenerative changes without acute bony findings.

## 2019-03-08 DIAGNOSIS — J9610 Chronic respiratory failure, unspecified whether with hypoxia or hypercapnia: Secondary | ICD-10-CM | POA: Diagnosis not present

## 2019-03-08 DIAGNOSIS — G4733 Obstructive sleep apnea (adult) (pediatric): Secondary | ICD-10-CM | POA: Diagnosis not present

## 2019-03-08 DIAGNOSIS — J45909 Unspecified asthma, uncomplicated: Secondary | ICD-10-CM | POA: Diagnosis not present

## 2019-03-08 DIAGNOSIS — R0902 Hypoxemia: Secondary | ICD-10-CM | POA: Diagnosis not present

## 2019-03-15 ENCOUNTER — Ambulatory Visit: Payer: Medicare HMO | Admitting: Podiatry

## 2019-03-17 ENCOUNTER — Encounter (HOSPITAL_BASED_OUTPATIENT_CLINIC_OR_DEPARTMENT_OTHER): Payer: Self-pay | Admitting: *Deleted

## 2019-03-17 ENCOUNTER — Other Ambulatory Visit: Payer: Self-pay

## 2019-03-17 ENCOUNTER — Emergency Department (HOSPITAL_BASED_OUTPATIENT_CLINIC_OR_DEPARTMENT_OTHER)
Admission: EM | Admit: 2019-03-17 | Discharge: 2019-03-17 | Disposition: A | Discharge status: Home / Self Care | Payer: Medicare HMO | Attending: Emergency Medicine | Admitting: Emergency Medicine

## 2019-03-17 DIAGNOSIS — Y999 Unspecified external cause status: Secondary | ICD-10-CM | POA: Diagnosis not present

## 2019-03-17 DIAGNOSIS — Z23 Encounter for immunization: Secondary | ICD-10-CM | POA: Insufficient documentation

## 2019-03-17 DIAGNOSIS — R69 Illness, unspecified: Secondary | ICD-10-CM | POA: Diagnosis not present

## 2019-03-17 DIAGNOSIS — J45909 Unspecified asthma, uncomplicated: Secondary | ICD-10-CM | POA: Insufficient documentation

## 2019-03-17 DIAGNOSIS — Y939 Activity, unspecified: Secondary | ICD-10-CM | POA: Insufficient documentation

## 2019-03-17 DIAGNOSIS — W01110A Fall on same level from slipping, tripping and stumbling with subsequent striking against sharp glass, initial encounter: Secondary | ICD-10-CM | POA: Diagnosis not present

## 2019-03-17 DIAGNOSIS — F99 Mental disorder, not otherwise specified: Secondary | ICD-10-CM | POA: Insufficient documentation

## 2019-03-17 DIAGNOSIS — S6992XA Unspecified injury of left wrist, hand and finger(s), initial encounter: Secondary | ICD-10-CM | POA: Diagnosis not present

## 2019-03-17 DIAGNOSIS — S59901A Unspecified injury of right elbow, initial encounter: Secondary | ICD-10-CM | POA: Diagnosis present

## 2019-03-17 DIAGNOSIS — S30811A Abrasion of abdominal wall, initial encounter: Secondary | ICD-10-CM

## 2019-03-17 DIAGNOSIS — S61211A Laceration without foreign body of left index finger without damage to nail, initial encounter: Secondary | ICD-10-CM | POA: Insufficient documentation

## 2019-03-17 DIAGNOSIS — I10 Essential (primary) hypertension: Secondary | ICD-10-CM | POA: Diagnosis not present

## 2019-03-17 DIAGNOSIS — Z79899 Other long term (current) drug therapy: Secondary | ICD-10-CM | POA: Insufficient documentation

## 2019-03-17 DIAGNOSIS — S51011A Laceration without foreign body of right elbow, initial encounter: Secondary | ICD-10-CM | POA: Diagnosis not present

## 2019-03-17 DIAGNOSIS — E119 Type 2 diabetes mellitus without complications: Secondary | ICD-10-CM | POA: Insufficient documentation

## 2019-03-17 DIAGNOSIS — S51012A Laceration without foreign body of left elbow, initial encounter: Secondary | ICD-10-CM | POA: Diagnosis not present

## 2019-03-17 DIAGNOSIS — W19XXXA Unspecified fall, initial encounter: Secondary | ICD-10-CM

## 2019-03-17 DIAGNOSIS — Y92199 Unspecified place in other specified residential institution as the place of occurrence of the external cause: Secondary | ICD-10-CM | POA: Diagnosis not present

## 2019-03-17 DIAGNOSIS — Z7984 Long term (current) use of oral hypoglycemic drugs: Secondary | ICD-10-CM | POA: Diagnosis not present

## 2019-03-17 MED ORDER — LIDOCAINE-EPINEPHRINE (PF) 2 %-1:200000 IJ SOLN
10.0000 mL | Freq: Once | INTRAMUSCULAR | Status: AC
  Administered 2019-03-17: 10 mL
  Filled 2019-03-17 (×2): qty 10

## 2019-03-17 MED ORDER — TETANUS-DIPHTH-ACELL PERTUSSIS 5-2.5-18.5 LF-MCG/0.5 IM SUSP
0.5000 mL | Freq: Once | INTRAMUSCULAR | Status: AC
  Administered 2019-03-17: 0.5 mL via INTRAMUSCULAR
  Filled 2019-03-17: qty 0.5

## 2019-03-17 NOTE — ED Triage Notes (Signed)
Pt is from a group home. States she fell this morning and landed on a glass table. She has Right elbow laceration, left index finger laceration, and abrasions to abdomen. Bandages not removed  In triage. She states she thinks there is glass in the wound on her abdomen

## 2019-03-17 NOTE — ED Notes (Signed)
ED Provider at bedside. 

## 2019-03-17 NOTE — ED Provider Notes (Signed)
..  Laceration Repair  Date/Time: 03/17/2019 4:20 PM Performed by: Tedd Sias, PA Authorized by: Tedd Sias, PA   Consent:    Consent obtained:  Verbal   Consent given by:  Patient   Risks discussed:  Infection, pain and poor wound healing   Alternatives discussed:  No treatment, delayed treatment and observation Anesthesia (see MAR for exact dosages):    Anesthesia method:  Local infiltration   Local anesthetic:  Lidocaine 2% WITH epi Laceration details:    Location:  Shoulder/arm   Shoulder/arm location:  R elbow   Length (cm):  2 Exploration:    Hemostasis achieved with:  Direct pressure   Contaminated: no   Treatment:    Area cleansed with:  Saline   Amount of cleaning:  Standard   Irrigation solution:  Sterile water   Irrigation method:  Pressure wash   Visualized foreign bodies/material removed: no   Skin repair:    Repair method:  Sutures   Suture size:  5-0   Suture material:  Prolene   Number of sutures:  6 Approximation:    Approximation:  Close Post-procedure details:    Dressing:  Antibiotic ointment and non-adherent dressing   Patient tolerance of procedure:  Tolerated well, no immediate complications      Tedd Sias, [place] 03/17/19 1622    Isla Pence, MD 03/17/19 1646

## 2019-03-17 NOTE — ED Provider Notes (Signed)
[facility] EMERGENCY DEPARTMENT Provider Note   CSN: [id number] Arrival date & time: 03/17/19  1343     History   Chief Complaint Chief Complaint  Patient presents with  . Fall    HPI Shelley [name] is a 53 y.o. female.     Pt presents to the ED today with a fall and lac to her left index finger, right elbow, and abdomen.  Pt has MR and lives in a group home.  She wears oxygen prn with a long hose.  The caregiver thinks she may have tripped and fell over the hose.  The pt landed on her glass table.  The pt did not hit her head.  She had no loc.  No bony pain.       Past Medical History:  Diagnosis Date  . Anxiety   . Arthritis   . Asthma   . AVM (arteriovenous malformation) of colon   . Diabetes mellitus   . Diverticulosis   . GERD (gastroesophageal reflux disease)   . GI bleed 09/2015  . Hemorrhoids    Internal and external  . Hiatal hernia   . Hyperlipidemia   . Hypertension   . Mental retardation   . Pneumonia     Patient Active Problem List   Diagnosis Date Noted  . 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 Asthma, mild intermittent 11/01/2018  . Combined fat and carbohydrate induced hyperlipemia 11/01/2018  . Incontinence 11/01/2018  . Acute renal failure (ARF) (HCC)   . AKI (acute kidney injury) (HCC) 05/09/2016  . Acute on chronic respiratory failure (HCC) 05/09/2016  . Chronic constipation 05/05/2016  . AVM (arteriovenous malformation) of colon 05/05/2016  . Acute lower GI bleeding 05/03/2016  . Rectal bleeding 03/11/2016  . Hyponatremia 03/11/2016  . Acute blood loss anemia   . Bleeding gastrointestinal   . Obesity hypoventilation syndrome (HCC)   . BiPAP (biphasic positive airway pressure) dependence   . Diabetes mellitus type 2, noninsulin dependent (HCC)   . Symptomatic anemia 07/30/2015  . Anemia 07/29/2015  . Lower GI bleed 07/29/2015  . Diverticulosis 07/29/2015  . History of arteriovenous malformation (AVM) 07/29/2015  .  Gastro-esophageal reflux disease without esophagitis 07/29/2015  . Hemorrhoid 06/19/2015  . Obesity, Class III, BMI 40-49.9 (morbid obesity) (HCC) 03/06/2013  . Bleeding internal hemorrhoids 03/06/2013  . Dependence on supplemental oxygen 03/06/2013  . Anxiety   . Mental retardation   . Iron deficiency anemia due to chronic blood loss 02/06/2013  . Hypokalemia 02/06/2013  . Diabetes mellitus (HCC) 02/06/2013  . Physical deconditioning 11/08/2012  . Major depression 05/15/2012  . Cognitive developmental delay 12/02/2011  . Obstructive sleep apnea 12/02/2011  . Chronic respiratory failure (HCC) 12/02/2011  . Combined hyperlipidemia associated with type 2 diabetes mellitus (HCC) 08/18/2011  . Benign essential hypertension 08/10/2010  . Moderate persistent asthma 08/10/2010  . 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 Colon, diverticulosis 12/08/2009  . ALLERGIC RHINITIS 08/02/2007    Past Surgical History:  Procedure Laterality Date  . CHOLECYSTECTOMY     2006  . COLONOSCOPY N/A 02/06/2013   Procedure: COLONOSCOPY;  Surgeon: Theda Belfast, MD;  Location: [facility] ENDOSCOPY;  Service: Endoscopy;  Laterality: N/A;  . COLONOSCOPY N/A 07/31/2015   Procedure: COLONOSCOPY;  Surgeon: Jeani Hawking, MD;  Location: [facility] ENDOSCOPY;  Service: Endoscopy;  Laterality: N/A;  . ESOPHAGOGASTRODUODENOSCOPY N/A 02/06/2013   Procedure: ESOPHAGOGASTRODUODENOSCOPY (EGD);  Surgeon: Theda Belfast, MD;  Location: [facility] ENDOSCOPY;  Service: Endoscopy;  Laterality: N/A;  .  EYE SURGERY    . FLEXIBLE SIGMOIDOSCOPY N/A 09/18/2015   Procedure: FLEXIBLE SIGMOIDOSCOPY;  Surgeon: Jeani HawkingPatrick Hung, MD;  [facility];  Service: Endoscopy;  Laterality: N/A;  . HEMORRHOID SURGERY N/A 05/06/2016   Procedure: HEMORRHOIDECTOMY, HEMORRHOID LIGATION;  Surgeon: Karie SodaSteven Gross, MD;  Location: [facility] ORS;  Service: General;  Laterality: N/A;  . HOT HEMOSTASIS N/A 07/31/2015   Procedure: HOT HEMOSTASIS (ARGON PLASMA  COAGULATION/BICAP);  Surgeon: Jeani HawkingPatrick Hung, MD;  [facility];  Service: Endoscopy;  Laterality: N/A;     OB History   No obstetric history on file.      Home Medications    Prior to Admission medications   Medication Sig Start Date End Date Taking? Authorizing Provider  acetaminophen (TYLENOL) 500 MG tablet Take 500 mg by mouth every 6 (six) hours as needed.    [provider]  albuterol (PROVENTIL) (2.5 MG/3ML) 0.083% nebulizer solution TAKE 3 MLS (2.5 MG TOTAL) BY NEBULIZATION EVERY 6 HOURS IF NEEDED FOR WHEEZING/SHORTNESS OF BREATH 12/10/18   Glenford BayleyWalsh, Elizabeth W, NP  atorvastatin (LIPITOR) 10 MG tablet Take 10 mg by mouth daily.    [provider]  FERREX 150 150 MG capsule Take 150 mg by mouth daily. 07/22/15   [provider]  hydrochlorothiazide (HYDRODIURIL) 12.5 MG tablet Take 12.5 mg by mouth every morning. 10/08/18   [provider]  loratadine (CLARITIN) 10 MG tablet Take 10 mg by mouth daily as needed for allergies.     [provider]  losartan (COZAAR) 100 MG tablet Take 100 mg by mouth daily. 10/08/18   [provider]  losartan-hydrochlorothiazide (HYZAAR) 100-12.5 MG tablet Take 1 tablet by mouth daily.  03/17/16   [provider]  metFORMIN (GLUCOPHAGE) 500 MG tablet Take 1,000 mg by mouth every evening.     [provider]  montelukast (SINGULAIR) 10 MG tablet Take 10 mg by mouth at bedtime.    [provider]  MYRBETRIQ 25 MG TB24 tablet Take 25 mg by mouth daily. 10/24/18   [provider]  omeprazole (PRILOSEC) 40 MG capsule Take 40 mg by mouth daily.  04/04/16   [provider]  Potassium Chloride ER 20 MEQ TBCR Take 1 tablet by mouth daily. with food 06/04/18   [provider]  potassium chloride SA (K-DUR) 20 MEQ tablet Take 20 mEq by mouth daily. with food 01/02/19   [provider]  sertraline (ZOLOFT) 100 MG tablet Take 200 mg by mouth daily.  01/02/19   [provider]  sertraline (ZOLOFT) 50 MG tablet Take 100 mg by mouth daily.     [provider]  traMADol (ULTRAM) 50 MG tablet Take 1 tablet (50 mg total) by mouth every 6 (six) hours as needed. Patient taking differently: Take 50 mg by mouth every 6 (six) hours as needed for moderate pain.  07/11/18   Ward, Layla MawKristen N, DO    Family History Family History  Problem Relation Age of Onset  . COPD Mother   . Bone cancer Father     Social History Social History   Tobacco Use  . Smoking status: Never Smoker  . Smokeless tobacco: Never Used  Substance Use Topics  . Alcohol use: No  . Drug use: No     Allergies   Fish allergy, Pepto-bismol [bismuth subsalicylate], Cortisone, and Trichophyton   Review of Systems Review of Systems  Skin: Positive for wound.  All other systems reviewed and are negative.    Physical Exam Updated  Vital Signs BP (!) 147/80 (BP Location: Left Arm)   Pulse (!) 111   Temp 99.4 F (37.4 C) (Oral)   Resp 18   Ht 5\' 7"  (1.702 m)   Wt 98.4 kg   SpO2 94%   BMI 33.99 kg/m   Physical Exam Vitals signs and nursing note reviewed.  Constitutional:      Appearance: Normal appearance. She is obese.  HENT:     Head: Normocephalic and atraumatic.     Right Ear: External ear normal.     Left Ear: External ear normal.     Nose: Nose normal.     Mouth/Throat:     Mouth: Mucous membranes are moist.     Pharynx: Oropharynx is clear.  Eyes:     Extraocular Movements: Extraocular movements intact.     Conjunctiva/sclera: Conjunctivae normal.     Pupils: Pupils are equal, round, and reactive to light.  Neck:     Musculoskeletal: Normal range of motion and neck supple.  Cardiovascular:     Rate and Rhythm: Normal rate and regular rhythm.     Pulses: Normal pulses.     Heart sounds: Normal heart sounds.  Pulmonary:     Effort: Pulmonary effort is normal.     Breath sounds: Normal breath sounds.  Abdominal:     General:  Abdomen is flat. Bowel sounds are normal.     Palpations: Abdomen is soft.  Musculoskeletal: Normal range of motion.  Skin:    Capillary Refill: Capillary refill takes less than 2 seconds.     Comments: Skin tear to left index finger. 2 cm lac to right elbow. Abrasions to abdomen.  Neurological:     General: No focal deficit present.     Mental Status: She is alert. Mental status is at baseline.  Psychiatric:        Mood and Affect: Mood normal.        Behavior: Behavior normal.        Thought Content: Thought content normal.        Judgment: Judgment normal.      ED Treatments / Results  Labs (all labs ordered are listed, but only abnormal results are displayed) Labs Reviewed - No data to display  EKG None  Radiology No results found.  Procedures Procedures (including critical care time)  Medications Ordered in ED Medications  lidocaine-EPINEPHrine (XYLOCAINE W/EPI) 2 %-1:200000 (PF) injection 10 mL (10 mLs Infiltration Given 03/17/19 1516)  Tdap (BOOSTRIX) injection 0.5 mL (0.5 mLs Intramuscular Given 03/17/19 1516)     Initial Impression / Assessment and Plan / ED Course  I have reviewed the triage vital signs and the nursing notes.  Pertinent labs & imaging results that were available during my care of the patient were reviewed by me and considered in my medical decision making (see chart for details).       Laceration repaired by PA [place].  Pt's wound cleaned and dressed.  Tetanus updated.  Pt knows to return in 1 week for suture removal.  Final Clinical Impressions(s) / ED Diagnoses   Final diagnoses:  Fall, initial encounter  Laceration of right elbow, initial encounter  Finger injury, left, initial encounter  Abrasion of abdominal wall, initial encounter    ED Discharge Orders    None       Isla Pence, MD 03/17/19 1622

## 2019-03-28 ENCOUNTER — Encounter (HOSPITAL_BASED_OUTPATIENT_CLINIC_OR_DEPARTMENT_OTHER): Payer: Self-pay | Admitting: Emergency Medicine

## 2019-03-28 ENCOUNTER — Other Ambulatory Visit: Payer: Self-pay

## 2019-03-28 ENCOUNTER — Emergency Department (HOSPITAL_BASED_OUTPATIENT_CLINIC_OR_DEPARTMENT_OTHER)
Admission: EM | Admit: 2019-03-28 | Discharge: 2019-03-28 | Disposition: A | Discharge status: Home / Self Care | Payer: Medicare HMO | Attending: Emergency Medicine | Admitting: Emergency Medicine

## 2019-03-28 DIAGNOSIS — S51011D Laceration without foreign body of right elbow, subsequent encounter: Secondary | ICD-10-CM | POA: Insufficient documentation

## 2019-03-28 DIAGNOSIS — R0902 Hypoxemia: Secondary | ICD-10-CM | POA: Diagnosis not present

## 2019-03-28 DIAGNOSIS — X58XXXD Exposure to other specified factors, subsequent encounter: Secondary | ICD-10-CM | POA: Diagnosis not present

## 2019-03-28 DIAGNOSIS — Z7984 Long term (current) use of oral hypoglycemic drugs: Secondary | ICD-10-CM | POA: Insufficient documentation

## 2019-03-28 DIAGNOSIS — F70 Mild intellectual disabilities: Secondary | ICD-10-CM | POA: Insufficient documentation

## 2019-03-28 DIAGNOSIS — J45909 Unspecified asthma, uncomplicated: Secondary | ICD-10-CM | POA: Diagnosis not present

## 2019-03-28 DIAGNOSIS — E7849 Other hyperlipidemia: Secondary | ICD-10-CM | POA: Insufficient documentation

## 2019-03-28 DIAGNOSIS — E1169 Type 2 diabetes mellitus with other specified complication: Secondary | ICD-10-CM | POA: Diagnosis not present

## 2019-03-28 DIAGNOSIS — Z4802 Encounter for removal of sutures: Secondary | ICD-10-CM

## 2019-03-28 DIAGNOSIS — R69 Illness, unspecified: Secondary | ICD-10-CM | POA: Diagnosis not present

## 2019-03-28 DIAGNOSIS — G4733 Obstructive sleep apnea (adult) (pediatric): Secondary | ICD-10-CM | POA: Diagnosis not present

## 2019-03-28 DIAGNOSIS — I10 Essential (primary) hypertension: Secondary | ICD-10-CM | POA: Diagnosis not present

## 2019-03-28 DIAGNOSIS — S51012D Laceration without foreign body of left elbow, subsequent encounter: Secondary | ICD-10-CM | POA: Diagnosis not present

## 2019-03-28 DIAGNOSIS — Z79899 Other long term (current) drug therapy: Secondary | ICD-10-CM | POA: Insufficient documentation

## 2019-03-28 DIAGNOSIS — J9610 Chronic respiratory failure, unspecified whether with hypoxia or hypercapnia: Secondary | ICD-10-CM | POA: Diagnosis not present

## 2019-03-28 DIAGNOSIS — Z888 Allergy status to other drugs, medicaments and biological substances status: Secondary | ICD-10-CM | POA: Insufficient documentation

## 2019-03-28 DIAGNOSIS — S51001A Unspecified open wound of right elbow, initial encounter: Secondary | ICD-10-CM | POA: Diagnosis not present

## 2019-03-28 DIAGNOSIS — J4520 Mild intermittent asthma, uncomplicated: Secondary | ICD-10-CM | POA: Diagnosis not present

## 2019-03-28 NOTE — ED Triage Notes (Signed)
Suture removal from right elbow - 11 days post placement.

## 2019-03-28 NOTE — ED Provider Notes (Signed)
[facility] EMERGENCY DEPARTMENT Provider Note   CSN: [id number] Arrival date & time: 03/28/19  1037     History   Chief Complaint Chief Complaint  Patient presents with  . Suture / Staple Removal    HPI Shelley [name] is a 53 y.o. female with history of electrical disability, hypertension, hyperlipidemia, diabetes, asthma, anxiety presenting for suture removal.  6 simple interrupted sutures were placed to the right elbow 11 days ago after a fall.  She reports no drainage or fever to the area and overall appears to be healing well.  She has no acute complaints.    The history is provided by the patient.    Past Medical History:  Diagnosis Date  . Anxiety   . Arthritis   . Asthma   . AVM (arteriovenous malformation) of colon   . Diabetes mellitus   . Diverticulosis   . GERD (gastroesophageal reflux disease)   . GI bleed 09/2015  . Hemorrhoids    Internal and external  . Hiatal hernia   . Hyperlipidemia   . Hypertension   . Mental retardation   . Pneumonia     Patient Active Problem List   Diagnosis Date Noted  . 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 Asthma, mild intermittent 11/01/2018  . Combined fat and carbohydrate induced hyperlipemia 11/01/2018  . Incontinence 11/01/2018  . Acute renal failure (ARF) (HCC)   . AKI (acute kidney injury) (HCC) 05/09/2016  . Acute on chronic respiratory failure (HCC) 05/09/2016  . Chronic constipation 05/05/2016  . AVM (arteriovenous malformation) of colon 05/05/2016  . Acute lower GI bleeding 05/03/2016  . Rectal bleeding 03/11/2016  . Hyponatremia 03/11/2016  . Acute blood loss anemia   . Bleeding gastrointestinal   . Obesity hypoventilation syndrome (HCC)   . BiPAP (biphasic positive airway pressure) dependence   . Diabetes mellitus type 2, noninsulin dependent (HCC)   . Symptomatic anemia 07/30/2015  . Anemia 07/29/2015  . Lower GI bleed 07/29/2015  . Diverticulosis 07/29/2015  . History of  arteriovenous malformation (AVM) 07/29/2015  . Gastro-esophageal reflux disease without esophagitis 07/29/2015  . Hemorrhoid 06/19/2015  . Obesity, Class III, BMI 40-49.9 (morbid obesity) (HCC) 03/06/2013  . Bleeding internal hemorrhoids 03/06/2013  . Dependence on supplemental oxygen 03/06/2013  . Anxiety   . Mental retardation   . Iron deficiency anemia due to chronic blood loss 02/06/2013  . Hypokalemia 02/06/2013  . Diabetes mellitus (HCC) 02/06/2013  . Physical deconditioning 11/08/2012  . Major depression 05/15/2012  . Cognitive developmental delay 12/02/2011  . Obstructive sleep apnea 12/02/2011  . Chronic respiratory failure (HCC) 12/02/2011  . Combined hyperlipidemia associated with type 2 diabetes mellitus (HCC) 08/18/2011  . Benign essential hypertension 08/10/2010  . Moderate persistent asthma 08/10/2010  . 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 Colon, diverticulosis 12/08/2009  . ALLERGIC RHINITIS 08/02/2007    Past Surgical History:  Procedure Laterality Date  . CHOLECYSTECTOMY     2006  . COLONOSCOPY N/A 02/06/2013   Procedure: COLONOSCOPY;  Surgeon: Theda BelfastPatrick D Hung, MD;  Location: [facility] ENDOSCOPY;  Service: Endoscopy;  Laterality: N/A;  . COLONOSCOPY N/A 07/31/2015   Procedure: COLONOSCOPY;  Surgeon: Jeani HawkingPatrick Hung, MD;  [facility];  Service: Endoscopy;  Laterality: N/A;  . ESOPHAGOGASTRODUODENOSCOPY N/A 02/06/2013   Procedure: ESOPHAGOGASTRODUODENOSCOPY (EGD);  Surgeon: Theda BelfastPatrick D Hung, MD;  [facility];  Service: Endoscopy;  Laterality: N/A;  . EYE SURGERY    . FLEXIBLE SIGMOIDOSCOPY N/A 09/18/2015   Procedure: FLEXIBLE SIGMOIDOSCOPY;  Surgeon: Carol Ada, MD;  Location: [facility] ENDOSCOPY;  Service: Endoscopy;  Laterality: N/A;  . HEMORRHOID SURGERY N/A 05/06/2016   Procedure: HEMORRHOIDECTOMY, HEMORRHOID LIGATION;  Surgeon: Michael Boston, MD;  Location: [facility] ORS;  Service: General;  Laterality: N/A;  . HOT HEMOSTASIS N/A 07/31/2015    Procedure: HOT HEMOSTASIS (ARGON PLASMA COAGULATION/BICAP);  Surgeon: Carol Ada, MD;  Location: [facility] ENDOSCOPY;  Service: Endoscopy;  Laterality: N/A;     OB History   No obstetric history on file.      Home Medications    Prior to Admission medications   Medication Sig Start Date End Date Taking? Authorizing Provider  acetaminophen (TYLENOL) 500 MG tablet Take 500 mg by mouth every 6 (six) hours as needed.    [provider]  albuterol (PROVENTIL) (2.5 MG/3ML) 0.083% nebulizer solution TAKE 3 MLS (2.5 MG TOTAL) BY NEBULIZATION EVERY 6 HOURS IF NEEDED FOR WHEEZING/SHORTNESS OF BREATH 12/10/18   Martyn Ehrich, NP  atorvastatin (LIPITOR) 10 MG tablet Take 10 mg by mouth daily.    [provider]  FERREX 150 150 MG capsule Take 150 mg by mouth daily. 07/22/15   [provider]  hydrochlorothiazide (HYDRODIURIL) 12.5 MG tablet Take 12.5 mg by mouth every morning. 10/08/18   [provider]  loratadine (CLARITIN) 10 MG tablet Take 10 mg by mouth daily as needed for allergies.     [provider]  losartan (COZAAR) 100 MG tablet Take 100 mg by mouth daily. 10/08/18   [provider]  losartan-hydrochlorothiazide (HYZAAR) 100-12.5 MG tablet Take 1 tablet by mouth daily.  03/17/16   [provider]  metFORMIN (GLUCOPHAGE) 500 MG tablet Take 1,000 mg by mouth every evening.     [provider]  montelukast (SINGULAIR) 10 MG tablet Take 10 mg by mouth at bedtime.    [provider]  MYRBETRIQ 25 MG TB24 tablet Take 25 mg by mouth daily. 10/24/18   [provider]  omeprazole (PRILOSEC) 40 MG capsule Take 40 mg by mouth daily.  04/04/16   [provider]  Potassium Chloride ER 20 MEQ TBCR Take 1 tablet by mouth daily. with food 06/04/18   [provider]  potassium chloride SA (K-DUR) 20 MEQ tablet Take 20 mEq by mouth daily. with food 01/02/19   [provider]  sertraline (ZOLOFT) 100  MG tablet Take 200 mg by mouth daily. 01/02/19   [provider]  sertraline (ZOLOFT) 50 MG tablet Take 100 mg by mouth daily.     [provider]  traMADol (ULTRAM) 50 MG tablet Take 1 tablet (50 mg total) by mouth every 6 (six) hours as needed. Patient taking differently: Take 50 mg by mouth every 6 (six) hours as needed for moderate pain.  07/11/18   Ward, Delice Bison, DO    Family History Family History  Problem Relation Age of Onset  . COPD Mother   . Bone cancer Father     Social History Social History   Tobacco Use  . Smoking status: Never Smoker  . Smokeless tobacco: Never Used  Substance Use Topics  . Alcohol use: No  . Drug use: No     Allergies   Fish allergy, Pepto-bismol [bismuth subsalicylate], Cortisone, and Trichophyton   Review of Systems Review of Systems  Constitutional: Negative for fever.  Skin: Positive for wound.     Physical Exam Updated Vital Signs BP 128/80 (BP Location: Left Arm)   Pulse 94   Temp 98.5 F (36.9  C) (Oral)   Resp 16   Ht 5\' 7"  (1.702 m)   Wt 98.4 kg   LMP 09/27/2012   SpO2 95%   BMI 33.99 kg/m   Physical Exam Vitals signs and nursing note reviewed.  Constitutional:      General: She is not in acute distress.    Appearance: She is well-developed.     Comments: Resting comfortably in chair  HENT:     Head: Normocephalic and atraumatic.  Eyes:     General:        Right eye: No discharge.        Left eye: No discharge.     Conjunctiva/sclera: Conjunctivae normal.  Neck:     Vascular: No JVD.     Trachea: No tracheal deviation.  Cardiovascular:     Rate and Rhythm: Normal rate.  Pulmonary:     Effort: Pulmonary effort is normal.  Abdominal:     General: There is no distension.  Musculoskeletal: Normal range of motion.     Comments: Normal range of motion of the right elbow  Skin:    General: Skin is warm and dry.     Findings: No erythema.     Comments: 6 simple interrupted sutures  overlying right elbow wound.  No wound dehiscence, erythema, tenderness, or abnormal drainage.  No induration or warmth.  Neurological:     Mental Status: She is alert.  Psychiatric:        Behavior: Behavior normal.      ED Treatments / Results  Labs (all labs ordered are listed, but only abnormal results are displayed) Labs Reviewed - No data to display  EKG None  Radiology No results found.  Procedures .Suture Removal  Date/Time: 03/28/2019 12:04 PM Performed by: 03/30/2019, PA-C Authorized by: Jeanie Sewer, PA-C   Consent:    Consent obtained:  Verbal   Consent given by:  Patient   Risks discussed:  Bleeding, pain and wound separation   Alternatives discussed:  Delayed treatment and no treatment Location:    Location:  Upper extremity   Upper extremity location:  Elbow   Elbow location:  R elbow Procedure details:    Wound appearance:  No signs of infection, good wound healing, clean, nontender and nonpurulent   Number of sutures removed:  6 Post-procedure details:    Post-removal:  No dressing applied   Patient tolerance of procedure:  Tolerated well, no immediate complications   (including critical care time)  Medications Ordered in ED Medications - No data to display   Initial Impression / Assessment and Plan / ED Course  I have reviewed the triage vital signs and the nursing notes.  Pertinent labs & imaging results that were available during my care of the patient were reviewed by me and considered in my medical decision making (see chart for details).        Patient presenting to the ED for suture removal as detailed above.  She is afebrile, vital signs are stable.  She is nontoxic in appearance.  No signs of secondary skin infection or underlying abscess.  She is resting comfortably in no apparent distress.  She tolerated suture removal without difficulty.  Discussed ongoing wound care and scar minimization.  Discussed strict ED return  precautions.  Patient and caregiver verbalized understanding of and agreement with plan and patient stable for discharge home at this time.  Final Clinical Impressions(s) / ED Diagnoses   Final diagnoses:  Visit for suture removal  ED Discharge Orders    None       Bennye Alm 03/28/19 1205    Alvira Monday, MD 03/30/19 [phone]

## 2019-03-28 NOTE — Discharge Instructions (Signed)
Keep the area clean and dry.  Can take baths or showers.  Can apply antibiotic ointment as needed.  Return to the emergency department if any concerning signs or symptoms develop such as fevers, abnormal drainage, severe swelling or pain.

## 2019-04-04 DIAGNOSIS — K219 Gastro-esophageal reflux disease without esophagitis: Secondary | ICD-10-CM | POA: Diagnosis not present

## 2019-04-04 DIAGNOSIS — R109 Unspecified abdominal pain: Secondary | ICD-10-CM | POA: Diagnosis not present

## 2019-04-07 DIAGNOSIS — J45909 Unspecified asthma, uncomplicated: Secondary | ICD-10-CM | POA: Diagnosis not present

## 2019-04-07 DIAGNOSIS — J9610 Chronic respiratory failure, unspecified whether with hypoxia or hypercapnia: Secondary | ICD-10-CM | POA: Diagnosis not present

## 2019-04-07 DIAGNOSIS — G4733 Obstructive sleep apnea (adult) (pediatric): Secondary | ICD-10-CM | POA: Diagnosis not present

## 2019-04-07 DIAGNOSIS — R0902 Hypoxemia: Secondary | ICD-10-CM | POA: Diagnosis not present

## 2019-04-17 DIAGNOSIS — Z1159 Encounter for screening for other viral diseases: Secondary | ICD-10-CM | POA: Diagnosis not present

## 2019-04-28 DIAGNOSIS — J4520 Mild intermittent asthma, uncomplicated: Secondary | ICD-10-CM | POA: Diagnosis not present

## 2019-04-28 DIAGNOSIS — R0902 Hypoxemia: Secondary | ICD-10-CM | POA: Diagnosis not present

## 2019-04-28 DIAGNOSIS — J45909 Unspecified asthma, uncomplicated: Secondary | ICD-10-CM | POA: Diagnosis not present

## 2019-04-28 DIAGNOSIS — G4733 Obstructive sleep apnea (adult) (pediatric): Secondary | ICD-10-CM | POA: Diagnosis not present

## 2019-04-28 DIAGNOSIS — J9610 Chronic respiratory failure, unspecified whether with hypoxia or hypercapnia: Secondary | ICD-10-CM | POA: Diagnosis not present

## 2019-05-08 DIAGNOSIS — R0902 Hypoxemia: Secondary | ICD-10-CM | POA: Diagnosis not present

## 2019-05-08 DIAGNOSIS — G4733 Obstructive sleep apnea (adult) (pediatric): Secondary | ICD-10-CM | POA: Diagnosis not present

## 2019-05-08 DIAGNOSIS — J45909 Unspecified asthma, uncomplicated: Secondary | ICD-10-CM | POA: Diagnosis not present

## 2019-05-08 DIAGNOSIS — J9610 Chronic respiratory failure, unspecified whether with hypoxia or hypercapnia: Secondary | ICD-10-CM | POA: Diagnosis not present

## 2019-05-17 DIAGNOSIS — R2681 Unsteadiness on feet: Secondary | ICD-10-CM | POA: Diagnosis not present

## 2019-05-17 DIAGNOSIS — M6281 Muscle weakness (generalized): Secondary | ICD-10-CM | POA: Diagnosis not present

## 2019-05-20 DIAGNOSIS — M6281 Muscle weakness (generalized): Secondary | ICD-10-CM | POA: Diagnosis not present

## 2019-05-20 DIAGNOSIS — R2681 Unsteadiness on feet: Secondary | ICD-10-CM | POA: Diagnosis not present

## 2019-05-23 DIAGNOSIS — M6281 Muscle weakness (generalized): Secondary | ICD-10-CM | POA: Diagnosis not present

## 2019-05-23 DIAGNOSIS — R2681 Unsteadiness on feet: Secondary | ICD-10-CM | POA: Diagnosis not present

## 2019-05-24 DIAGNOSIS — M6281 Muscle weakness (generalized): Secondary | ICD-10-CM | POA: Diagnosis not present

## 2019-05-24 DIAGNOSIS — R2681 Unsteadiness on feet: Secondary | ICD-10-CM | POA: Diagnosis not present

## 2019-05-28 DIAGNOSIS — R2681 Unsteadiness on feet: Secondary | ICD-10-CM | POA: Diagnosis not present

## 2019-05-28 DIAGNOSIS — M6281 Muscle weakness (generalized): Secondary | ICD-10-CM | POA: Diagnosis not present

## 2019-05-28 DIAGNOSIS — J45909 Unspecified asthma, uncomplicated: Secondary | ICD-10-CM | POA: Diagnosis not present

## 2019-05-28 DIAGNOSIS — J9610 Chronic respiratory failure, unspecified whether with hypoxia or hypercapnia: Secondary | ICD-10-CM | POA: Diagnosis not present

## 2019-05-28 DIAGNOSIS — G4733 Obstructive sleep apnea (adult) (pediatric): Secondary | ICD-10-CM | POA: Diagnosis not present

## 2019-05-28 DIAGNOSIS — J4520 Mild intermittent asthma, uncomplicated: Secondary | ICD-10-CM | POA: Diagnosis not present

## 2019-05-28 DIAGNOSIS — R0902 Hypoxemia: Secondary | ICD-10-CM | POA: Diagnosis not present

## 2019-05-29 DIAGNOSIS — R2681 Unsteadiness on feet: Secondary | ICD-10-CM | POA: Diagnosis not present

## 2019-05-29 DIAGNOSIS — M6281 Muscle weakness (generalized): Secondary | ICD-10-CM | POA: Diagnosis not present

## 2019-05-30 DIAGNOSIS — R2681 Unsteadiness on feet: Secondary | ICD-10-CM | POA: Diagnosis not present

## 2019-05-30 DIAGNOSIS — M6281 Muscle weakness (generalized): Secondary | ICD-10-CM | POA: Diagnosis not present

## 2019-06-03 DIAGNOSIS — M6281 Muscle weakness (generalized): Secondary | ICD-10-CM | POA: Diagnosis not present

## 2019-06-03 DIAGNOSIS — R2681 Unsteadiness on feet: Secondary | ICD-10-CM | POA: Diagnosis not present

## 2019-06-05 DIAGNOSIS — M6281 Muscle weakness (generalized): Secondary | ICD-10-CM | POA: Diagnosis not present

## 2019-06-05 DIAGNOSIS — R2681 Unsteadiness on feet: Secondary | ICD-10-CM | POA: Diagnosis not present

## 2019-06-06 DIAGNOSIS — M6281 Muscle weakness (generalized): Secondary | ICD-10-CM | POA: Diagnosis not present

## 2019-06-06 DIAGNOSIS — R2681 Unsteadiness on feet: Secondary | ICD-10-CM | POA: Diagnosis not present

## 2019-06-07 DIAGNOSIS — R0902 Hypoxemia: Secondary | ICD-10-CM | POA: Diagnosis not present

## 2019-06-07 DIAGNOSIS — J45909 Unspecified asthma, uncomplicated: Secondary | ICD-10-CM | POA: Diagnosis not present

## 2019-06-07 DIAGNOSIS — G4733 Obstructive sleep apnea (adult) (pediatric): Secondary | ICD-10-CM | POA: Diagnosis not present

## 2019-06-07 DIAGNOSIS — J9610 Chronic respiratory failure, unspecified whether with hypoxia or hypercapnia: Secondary | ICD-10-CM | POA: Diagnosis not present

## 2019-06-10 DIAGNOSIS — R2681 Unsteadiness on feet: Secondary | ICD-10-CM | POA: Diagnosis not present

## 2019-06-10 DIAGNOSIS — M6281 Muscle weakness (generalized): Secondary | ICD-10-CM | POA: Diagnosis not present

## 2019-06-13 DIAGNOSIS — M6281 Muscle weakness (generalized): Secondary | ICD-10-CM | POA: Diagnosis not present

## 2019-06-13 DIAGNOSIS — R2681 Unsteadiness on feet: Secondary | ICD-10-CM | POA: Diagnosis not present

## 2019-06-14 DIAGNOSIS — M6281 Muscle weakness (generalized): Secondary | ICD-10-CM | POA: Diagnosis not present

## 2019-06-14 DIAGNOSIS — R2681 Unsteadiness on feet: Secondary | ICD-10-CM | POA: Diagnosis not present

## 2019-06-18 DIAGNOSIS — R2681 Unsteadiness on feet: Secondary | ICD-10-CM | POA: Diagnosis not present

## 2019-06-18 DIAGNOSIS — M6281 Muscle weakness (generalized): Secondary | ICD-10-CM | POA: Diagnosis not present

## 2019-06-20 DIAGNOSIS — M6281 Muscle weakness (generalized): Secondary | ICD-10-CM | POA: Diagnosis not present

## 2019-06-20 DIAGNOSIS — R2681 Unsteadiness on feet: Secondary | ICD-10-CM | POA: Diagnosis not present

## 2019-06-21 DIAGNOSIS — M6281 Muscle weakness (generalized): Secondary | ICD-10-CM | POA: Diagnosis not present

## 2019-06-21 DIAGNOSIS — R2681 Unsteadiness on feet: Secondary | ICD-10-CM | POA: Diagnosis not present

## 2019-06-25 DIAGNOSIS — R2681 Unsteadiness on feet: Secondary | ICD-10-CM | POA: Diagnosis not present

## 2019-06-25 DIAGNOSIS — M6281 Muscle weakness (generalized): Secondary | ICD-10-CM | POA: Diagnosis not present

## 2019-06-28 DIAGNOSIS — R2681 Unsteadiness on feet: Secondary | ICD-10-CM | POA: Diagnosis not present

## 2019-06-28 DIAGNOSIS — J9610 Chronic respiratory failure, unspecified whether with hypoxia or hypercapnia: Secondary | ICD-10-CM | POA: Diagnosis not present

## 2019-06-28 DIAGNOSIS — R0902 Hypoxemia: Secondary | ICD-10-CM | POA: Diagnosis not present

## 2019-06-28 DIAGNOSIS — G4733 Obstructive sleep apnea (adult) (pediatric): Secondary | ICD-10-CM | POA: Diagnosis not present

## 2019-06-28 DIAGNOSIS — J4520 Mild intermittent asthma, uncomplicated: Secondary | ICD-10-CM | POA: Diagnosis not present

## 2019-06-28 DIAGNOSIS — M6281 Muscle weakness (generalized): Secondary | ICD-10-CM | POA: Diagnosis not present

## 2019-06-28 DIAGNOSIS — J45909 Unspecified asthma, uncomplicated: Secondary | ICD-10-CM | POA: Diagnosis not present

## 2019-07-02 DIAGNOSIS — R2681 Unsteadiness on feet: Secondary | ICD-10-CM | POA: Diagnosis not present

## 2019-07-02 DIAGNOSIS — R2689 Other abnormalities of gait and mobility: Secondary | ICD-10-CM | POA: Diagnosis not present

## 2019-07-02 DIAGNOSIS — M6281 Muscle weakness (generalized): Secondary | ICD-10-CM | POA: Diagnosis not present

## 2019-07-04 DIAGNOSIS — R2689 Other abnormalities of gait and mobility: Secondary | ICD-10-CM | POA: Diagnosis not present

## 2019-07-04 DIAGNOSIS — R2681 Unsteadiness on feet: Secondary | ICD-10-CM | POA: Diagnosis not present

## 2019-07-04 DIAGNOSIS — M6281 Muscle weakness (generalized): Secondary | ICD-10-CM | POA: Diagnosis not present

## 2019-07-08 DIAGNOSIS — G4733 Obstructive sleep apnea (adult) (pediatric): Secondary | ICD-10-CM | POA: Diagnosis not present

## 2019-07-08 DIAGNOSIS — R2689 Other abnormalities of gait and mobility: Secondary | ICD-10-CM | POA: Diagnosis not present

## 2019-07-08 DIAGNOSIS — J9610 Chronic respiratory failure, unspecified whether with hypoxia or hypercapnia: Secondary | ICD-10-CM | POA: Diagnosis not present

## 2019-07-08 DIAGNOSIS — R0902 Hypoxemia: Secondary | ICD-10-CM | POA: Diagnosis not present

## 2019-07-08 DIAGNOSIS — J45909 Unspecified asthma, uncomplicated: Secondary | ICD-10-CM | POA: Diagnosis not present

## 2019-07-08 DIAGNOSIS — M6281 Muscle weakness (generalized): Secondary | ICD-10-CM | POA: Diagnosis not present

## 2019-07-10 DIAGNOSIS — R2689 Other abnormalities of gait and mobility: Secondary | ICD-10-CM | POA: Diagnosis not present

## 2019-07-10 DIAGNOSIS — M6281 Muscle weakness (generalized): Secondary | ICD-10-CM | POA: Diagnosis not present

## 2019-07-16 DIAGNOSIS — Z20828 Contact with and (suspected) exposure to other viral communicable diseases: Secondary | ICD-10-CM | POA: Diagnosis not present

## 2019-07-16 DIAGNOSIS — M6281 Muscle weakness (generalized): Secondary | ICD-10-CM | POA: Diagnosis not present

## 2019-07-16 DIAGNOSIS — R2689 Other abnormalities of gait and mobility: Secondary | ICD-10-CM | POA: Diagnosis not present

## 2019-07-16 DIAGNOSIS — Z03818 Encounter for observation for suspected exposure to other biological agents ruled out: Secondary | ICD-10-CM | POA: Diagnosis not present

## 2019-07-29 DIAGNOSIS — R0902 Hypoxemia: Secondary | ICD-10-CM | POA: Diagnosis not present

## 2019-07-29 DIAGNOSIS — G4733 Obstructive sleep apnea (adult) (pediatric): Secondary | ICD-10-CM | POA: Diagnosis not present

## 2019-07-29 DIAGNOSIS — J9610 Chronic respiratory failure, unspecified whether with hypoxia or hypercapnia: Secondary | ICD-10-CM | POA: Diagnosis not present

## 2019-07-29 DIAGNOSIS — J4520 Mild intermittent asthma, uncomplicated: Secondary | ICD-10-CM | POA: Diagnosis not present

## 2019-07-29 DIAGNOSIS — J45909 Unspecified asthma, uncomplicated: Secondary | ICD-10-CM | POA: Diagnosis not present

## 2019-07-30 DIAGNOSIS — M6281 Muscle weakness (generalized): Secondary | ICD-10-CM | POA: Diagnosis not present

## 2019-07-30 DIAGNOSIS — R2689 Other abnormalities of gait and mobility: Secondary | ICD-10-CM | POA: Diagnosis not present

## 2019-08-01 DIAGNOSIS — M6281 Muscle weakness (generalized): Secondary | ICD-10-CM | POA: Diagnosis not present

## 2019-08-01 DIAGNOSIS — R2689 Other abnormalities of gait and mobility: Secondary | ICD-10-CM | POA: Diagnosis not present

## 2019-08-06 DIAGNOSIS — R2689 Other abnormalities of gait and mobility: Secondary | ICD-10-CM | POA: Diagnosis not present

## 2019-08-06 DIAGNOSIS — M6281 Muscle weakness (generalized): Secondary | ICD-10-CM | POA: Diagnosis not present

## 2019-08-08 DIAGNOSIS — R0902 Hypoxemia: Secondary | ICD-10-CM | POA: Diagnosis not present

## 2019-08-08 DIAGNOSIS — J45909 Unspecified asthma, uncomplicated: Secondary | ICD-10-CM | POA: Diagnosis not present

## 2019-08-08 DIAGNOSIS — M6281 Muscle weakness (generalized): Secondary | ICD-10-CM | POA: Diagnosis not present

## 2019-08-08 DIAGNOSIS — G4733 Obstructive sleep apnea (adult) (pediatric): Secondary | ICD-10-CM | POA: Diagnosis not present

## 2019-08-08 DIAGNOSIS — R2689 Other abnormalities of gait and mobility: Secondary | ICD-10-CM | POA: Diagnosis not present

## 2019-08-08 DIAGNOSIS — J9610 Chronic respiratory failure, unspecified whether with hypoxia or hypercapnia: Secondary | ICD-10-CM | POA: Diagnosis not present

## 2019-08-09 DIAGNOSIS — R2689 Other abnormalities of gait and mobility: Secondary | ICD-10-CM | POA: Diagnosis not present

## 2019-08-09 DIAGNOSIS — M6281 Muscle weakness (generalized): Secondary | ICD-10-CM | POA: Diagnosis not present

## 2019-08-12 DIAGNOSIS — R2689 Other abnormalities of gait and mobility: Secondary | ICD-10-CM | POA: Diagnosis not present

## 2019-08-12 DIAGNOSIS — M6281 Muscle weakness (generalized): Secondary | ICD-10-CM | POA: Diagnosis not present

## 2019-08-15 DIAGNOSIS — R2689 Other abnormalities of gait and mobility: Secondary | ICD-10-CM | POA: Diagnosis not present

## 2019-08-15 DIAGNOSIS — M6281 Muscle weakness (generalized): Secondary | ICD-10-CM | POA: Diagnosis not present

## 2019-08-16 DIAGNOSIS — R2689 Other abnormalities of gait and mobility: Secondary | ICD-10-CM | POA: Diagnosis not present

## 2019-08-16 DIAGNOSIS — M6281 Muscle weakness (generalized): Secondary | ICD-10-CM | POA: Diagnosis not present

## 2019-08-18 DIAGNOSIS — R2689 Other abnormalities of gait and mobility: Secondary | ICD-10-CM | POA: Diagnosis not present

## 2019-08-18 DIAGNOSIS — M6281 Muscle weakness (generalized): Secondary | ICD-10-CM | POA: Diagnosis not present

## 2019-08-20 DIAGNOSIS — M6281 Muscle weakness (generalized): Secondary | ICD-10-CM | POA: Diagnosis not present

## 2019-08-20 DIAGNOSIS — R2689 Other abnormalities of gait and mobility: Secondary | ICD-10-CM | POA: Diagnosis not present

## 2019-08-26 DIAGNOSIS — J9610 Chronic respiratory failure, unspecified whether with hypoxia or hypercapnia: Secondary | ICD-10-CM | POA: Diagnosis not present

## 2019-08-26 DIAGNOSIS — G4733 Obstructive sleep apnea (adult) (pediatric): Secondary | ICD-10-CM | POA: Diagnosis not present

## 2019-08-26 DIAGNOSIS — R0902 Hypoxemia: Secondary | ICD-10-CM | POA: Diagnosis not present

## 2019-08-26 DIAGNOSIS — M6281 Muscle weakness (generalized): Secondary | ICD-10-CM | POA: Diagnosis not present

## 2019-08-26 DIAGNOSIS — J4520 Mild intermittent asthma, uncomplicated: Secondary | ICD-10-CM | POA: Diagnosis not present

## 2019-08-26 DIAGNOSIS — R2689 Other abnormalities of gait and mobility: Secondary | ICD-10-CM | POA: Diagnosis not present

## 2019-08-26 DIAGNOSIS — J45909 Unspecified asthma, uncomplicated: Secondary | ICD-10-CM | POA: Diagnosis not present

## 2019-08-29 DIAGNOSIS — M6281 Muscle weakness (generalized): Secondary | ICD-10-CM | POA: Diagnosis not present

## 2019-08-29 DIAGNOSIS — R2689 Other abnormalities of gait and mobility: Secondary | ICD-10-CM | POA: Diagnosis not present

## 2019-09-02 DIAGNOSIS — R2689 Other abnormalities of gait and mobility: Secondary | ICD-10-CM | POA: Diagnosis not present

## 2019-09-02 DIAGNOSIS — M6281 Muscle weakness (generalized): Secondary | ICD-10-CM | POA: Diagnosis not present

## 2019-09-05 ENCOUNTER — Ambulatory Visit: Payer: Medicare HMO | Admitting: Primary Care

## 2019-09-05 DIAGNOSIS — J45909 Unspecified asthma, uncomplicated: Secondary | ICD-10-CM | POA: Diagnosis not present

## 2019-09-05 DIAGNOSIS — J9610 Chronic respiratory failure, unspecified whether with hypoxia or hypercapnia: Secondary | ICD-10-CM | POA: Diagnosis not present

## 2019-09-05 DIAGNOSIS — M6281 Muscle weakness (generalized): Secondary | ICD-10-CM | POA: Diagnosis not present

## 2019-09-05 DIAGNOSIS — G4733 Obstructive sleep apnea (adult) (pediatric): Secondary | ICD-10-CM | POA: Diagnosis not present

## 2019-09-05 DIAGNOSIS — R0902 Hypoxemia: Secondary | ICD-10-CM | POA: Diagnosis not present

## 2019-09-05 DIAGNOSIS — R2689 Other abnormalities of gait and mobility: Secondary | ICD-10-CM | POA: Diagnosis not present

## 2019-09-12 ENCOUNTER — Ambulatory Visit: Payer: Medicare HMO | Admitting: Primary Care

## 2019-09-12 ENCOUNTER — Other Ambulatory Visit: Payer: Self-pay

## 2019-09-12 ENCOUNTER — Encounter: Payer: Self-pay | Admitting: Primary Care

## 2019-09-12 VITALS — BP 106/68 | HR 98 | Temp 97.1°F | Ht 67.0 in | Wt 214.8 lb

## 2019-09-12 DIAGNOSIS — M6281 Muscle weakness (generalized): Secondary | ICD-10-CM | POA: Diagnosis not present

## 2019-09-12 DIAGNOSIS — J4520 Mild intermittent asthma, uncomplicated: Secondary | ICD-10-CM

## 2019-09-12 DIAGNOSIS — Z6833 Body mass index (BMI) 33.0-33.9, adult: Secondary | ICD-10-CM | POA: Diagnosis not present

## 2019-09-12 DIAGNOSIS — J9610 Chronic respiratory failure, unspecified whether with hypoxia or hypercapnia: Secondary | ICD-10-CM | POA: Diagnosis not present

## 2019-09-12 DIAGNOSIS — R2689 Other abnormalities of gait and mobility: Secondary | ICD-10-CM | POA: Diagnosis not present

## 2019-09-12 DIAGNOSIS — G4733 Obstructive sleep apnea (adult) (pediatric): Secondary | ICD-10-CM | POA: Diagnosis not present

## 2019-09-12 DIAGNOSIS — E662 Morbid (severe) obesity with alveolar hypoventilation: Secondary | ICD-10-CM

## 2019-09-12 NOTE — Assessment & Plan Note (Signed)
-   Continue 2L oxygen at night °

## 2019-09-12 NOTE — Assessment & Plan Note (Addendum)
-   Stable; maintained on prn albuterol hfa and singulair - No recent exacerbations  - Off Pulmicort nebulizers

## 2019-09-12 NOTE — Progress Notes (Signed)
@Patient  ID: , female    DOB: November 18, 1965, 54 y.o.   MRN: [id number]  Chief Complaint  Patient presents with  . Follow-up    Referring provider: [id number], MD  HPI: 54 year old female, never smoked. PMH significant for OSA, obesity hypoventilation syndrome, moderate persistent asthma, chronic respiratory failure, HTN, type 2 diabetes, AVM, diverticulosis, GERD, hemorrhoids, hyperlipidemia, cognitive developmental delay, anxiety/depression, obesity. Patient of Dr. 40. Difficulty with BiPAP compliance, uses 4L oxygen at night. Maintained on Singulair and prn albuterol neb.   09/12/2019 Patient presents today for follow-up visit. She is accompanied by caregiver. She is doing very well. She did get new BIPAP machine from [employer] but has not been wearing it. States that she forget to put it on. She does wear oxygen at night. Denies nocturnal apnea symptoms or waking up short of breath. She has lost another 9 lbs for a total of 30 lbs since 2019. No recent asthma execrations. Continues take Singulair 10mg  daily. Doing fine off Pulmicort nebulizer. Denies shortness of breath, wheezing or cough.    Sleep tests: PSG 09/13/10>>AHI 98.6, SpO2 60% ONO with CPAP and 3 liters 01/13/12>>Test time 7 hrs 6 min. Mean SpO2 89%, low SpO2 62%. Spent 2 hrs 34 min with SpO2 < 88% CPAP 12/16/11 to 01/14/12>>Used on 6 of 30 nights with average 7 hrs 3 min. Average AHI 1.8 with CPAP 10 cm H2O. ONO with CPAP and 3 liters 01/13/12 >>Test time 7 hrs 6 min. Mean SpO2 89%, low SpO2 62%. Spent 2 hrs 34 min with SpO2 <88%. ONO with CPAP and 4 liters 01/31/12 >>Test time 7 hrs 36 min. Mean SpO2 91.6%, low SpO2 73%. Spent 56 min with SpO2 <88%. BPAP 02/20/12>>17/11 cm H2O with 4 liters oxygen.  Allergies  Allergen Reactions  . Fish Allergy Swelling  . Pepto-Bismol [Bismuth Subsalicylate] Nausea And Vomiting  . Cortisone Itching and Rash    Injections and cream only.  PO ok.  . Trichophyton  Itching    Also sneezing accompanying as well    Immunization History  Administered Date(s) Administered  . Influenza Split 03/07/2012, 03/13/2013, 03/07/2016, 02/21/2017  . Influenza Whole 03/14/2011  . Influenza,inj,Quad PF,6+ Mos 03/13/2014, 03/13/2015, 03/14/2018, 02/20/2019  . Moderna SARS-COVID-2 Vaccination 07/15/2019, 08/12/2019  . Pneumococcal-Unspecified 03/13/2010  . Tdap 03/17/2019    Past Medical History:  Diagnosis Date  . Anxiety   . Arthritis   . Asthma   . AVM (arteriovenous malformation) of colon   . Diabetes mellitus   . Diverticulosis   . GERD (gastroesophageal reflux disease)   . GI bleed 09/2015  . Hemorrhoids    Internal and external  . Hiatal hernia   . Hyperlipidemia   . Hypertension   . Mental retardation   . Pneumonia     Tobacco History: Social History   Tobacco Use  Smoking Status Never Smoker  Smokeless Tobacco Never Used   Counseling given: Not Answered   Outpatient Medications Prior to Visit  Medication Sig Dispense Refill  . acetaminophen (TYLENOL) 500 MG tablet Take 500 mg by mouth every 6 (six) hours as needed.    05/17/2019 albuterol (PROVENTIL) (2.5 MG/3ML) 0.083% nebulizer solution TAKE 3 MLS (2.5 MG TOTAL) BY NEBULIZATION EVERY 6 HOURS IF NEEDED FOR WHEEZING/SHORTNESS OF BREATH 360 mL 6  . atorvastatin (LIPITOR) 10 MG tablet Take 10 mg by mouth daily.    10/2015 FERREX 150 150 MG capsule Take 150 mg by mouth daily.  6  . hydrochlorothiazide (HYDRODIURIL) 12.5  MG tablet Take 12.5 mg by mouth every morning.    . loratadine (CLARITIN) 10 MG tablet Take 10 mg by mouth daily as needed for allergies.     Marland Kitchen losartan (COZAAR) 100 MG tablet Take 100 mg by mouth daily.    Marland Kitchen losartan-hydrochlorothiazide (HYZAAR) 100-12.5 MG tablet Take 1 tablet by mouth daily.     . metFORMIN (GLUCOPHAGE) 500 MG tablet Take 1,000 mg by mouth every evening.     . montelukast (SINGULAIR) 10 MG tablet Take 10 mg by mouth at bedtime.    Marland Kitchen MYRBETRIQ 25 MG TB24 tablet Take  25 mg by mouth daily.    Marland Kitchen omeprazole (PRILOSEC) 40 MG capsule Take 40 mg by mouth daily.     . Potassium Chloride ER 20 MEQ TBCR Take 1 tablet by mouth daily. with food    . potassium chloride SA (K-DUR) 20 MEQ tablet Take 20 mEq by mouth daily. with food    . sertraline (ZOLOFT) 100 MG tablet Take 200 mg by mouth daily.    . sertraline (ZOLOFT) 50 MG tablet Take 100 mg by mouth daily.     . traMADol (ULTRAM) 50 MG tablet Take 1 tablet (50 mg total) by mouth every 6 (six) hours as needed. (Patient taking differently: Take 50 mg by mouth every 6 (six) hours as needed for moderate pain. ) 15 tablet 0   No facility-administered medications prior to visit.    Review of Systems  Review of Systems  Constitutional: Negative.   Respiratory: Negative for cough, shortness of breath and wheezing.    Physical Exam  BP 106/68 (BP Location: Left Arm, Cuff Size: Normal)   Pulse 98   Temp (!) 97.1 F (36.2 C) (Temporal)   Ht 5\' 7"  (1.702 m)   Wt 214 lb 12.8 oz (97.4 kg)   LMP 09/27/2012   SpO2 100% Comment: RA  BMI 33.64 kg/m  Physical Exam Constitutional:      Appearance: Normal appearance.  HENT:     Mouth/Throat:     Mouth: Mucous membranes are moist.     Pharynx: Oropharynx is clear.  Cardiovascular:     Rate and Rhythm: Normal rate and regular rhythm.  Pulmonary:     Effort: Pulmonary effort is normal.     Breath sounds: Normal breath sounds.  Neurological:     Mental Status: She is alert. Mental status is at baseline.  Psychiatric:        Mood and Affect: Mood normal.      Lab Results:  CBC    Component Value Date/Time   WBC 3.0 (L) 07/10/2018 2225   RBC 3.67 (L) 07/10/2018 2225   HGB 9.7 (L) 07/10/2018 2225   HCT 32.6 (L) 07/10/2018 2225   PLT 127 (L) 07/10/2018 2225   MCV 88.8 07/10/2018 2225   MCH 26.4 07/10/2018 2225   MCHC 29.8 (L) 07/10/2018 2225   RDW 13.6 07/10/2018 2225   LYMPHSABS 0.7 07/10/2018 2225   MONOABS 0.5 07/10/2018 2225   EOSABS 0.0  07/10/2018 2225   BASOSABS 0.0 07/10/2018 2225    BMET    Component Value Date/Time   NA 132 (L) 07/10/2018 2225   K 3.5 07/10/2018 2225   CL 85 (L) 07/10/2018 2225   CO2 39 (H) 07/10/2018 2225   GLUCOSE 291 (H) 07/10/2018 2225   BUN 14 07/10/2018 2225   CREATININE 0.80 07/10/2018 2225   CALCIUM 8.3 (L) 07/10/2018 2225   GFRNONAA >60 07/10/2018 2225   GFRAA >60  07/10/2018 2225    BNP No results found for: BNP  ProBNP    Component Value Date/Time   PROBNP 32.7 11/29/2011 2332    Imaging: No results found.   Assessment & Plan:   Obstructive sleep apnea - Patient has lost close to 30 pounds since 2019. She has a difficult time remembering to wear BIPAP at night. Recommend rechecking split night sleep study d/t weight loss. May be able to discontinue use or lower BIPAP setting  - Follow-up in 6 months with Dr. Craige Cotta   Chronic respiratory failure (HCC) - Continue 2L oxygen at night   Asthma, mild intermittent - Stable; maintained on prn albuterol hfa and singulair - No recent exacerbations  - Off Pulmicort nebulizers   BMI 33.0-33.9,adult - Improved; BMI was >40 - Continue to encourage healthy weight loss and exercise   Glenford Bayley, NP 09/12/2019

## 2019-09-12 NOTE — Assessment & Plan Note (Addendum)
-   Improved; BMI was >40 - Continue to encourage healthy weight loss and exercise

## 2019-09-12 NOTE — Assessment & Plan Note (Addendum)
-   Patient has lost close to 30 pounds since 2019. She has a difficult time remembering to wear BIPAP at night. Recommend rechecking split night sleep study d/t weight loss. May be able to discontinue use or lower BIPAP setting  - Follow-up in 6 months with Dr. Craige Cotta

## 2019-09-12 NOTE — Patient Instructions (Addendum)
Recommendations: Set up mychart account  Congratulations on weight loss, goal is under 200 (great job!!!)  OSA: Recommend checking Split night sleep study   Asthma: Continue Singular 10mg  at bedtime Continue Albuterol rescue inhaler 2 puffs every 4-6 hours as needed for shortness of breath or wheezing   Chronic respiratory failure:  Use oxygen during the day as needed keep O2 >90% Continue 2L oxygen at bedtime   Follow-up: 6 months with Dr. 

## 2019-09-13 DIAGNOSIS — G4733 Obstructive sleep apnea (adult) (pediatric): Secondary | ICD-10-CM | POA: Diagnosis not present

## 2019-09-13 DIAGNOSIS — J9610 Chronic respiratory failure, unspecified whether with hypoxia or hypercapnia: Secondary | ICD-10-CM | POA: Diagnosis not present

## 2019-09-13 DIAGNOSIS — J45909 Unspecified asthma, uncomplicated: Secondary | ICD-10-CM | POA: Diagnosis not present

## 2019-09-13 DIAGNOSIS — J4520 Mild intermittent asthma, uncomplicated: Secondary | ICD-10-CM | POA: Diagnosis not present

## 2019-09-16 DIAGNOSIS — M6281 Muscle weakness (generalized): Secondary | ICD-10-CM | POA: Diagnosis not present

## 2019-09-16 DIAGNOSIS — R2689 Other abnormalities of gait and mobility: Secondary | ICD-10-CM | POA: Diagnosis not present

## 2019-09-19 DIAGNOSIS — Z1389 Encounter for screening for other disorder: Secondary | ICD-10-CM | POA: Diagnosis not present

## 2019-09-19 DIAGNOSIS — E1121 Type 2 diabetes mellitus with diabetic nephropathy: Secondary | ICD-10-CM | POA: Diagnosis not present

## 2019-09-19 DIAGNOSIS — N181 Chronic kidney disease, stage 1: Secondary | ICD-10-CM | POA: Diagnosis not present

## 2019-09-19 DIAGNOSIS — J4520 Mild intermittent asthma, uncomplicated: Secondary | ICD-10-CM | POA: Diagnosis not present

## 2019-09-19 DIAGNOSIS — M6281 Muscle weakness (generalized): Secondary | ICD-10-CM | POA: Diagnosis not present

## 2019-09-19 DIAGNOSIS — Z0000 Encounter for general adult medical examination without abnormal findings: Secondary | ICD-10-CM | POA: Diagnosis not present

## 2019-09-19 DIAGNOSIS — D649 Anemia, unspecified: Secondary | ICD-10-CM | POA: Diagnosis not present

## 2019-09-19 DIAGNOSIS — R2689 Other abnormalities of gait and mobility: Secondary | ICD-10-CM | POA: Diagnosis not present

## 2019-09-19 DIAGNOSIS — J9611 Chronic respiratory failure with hypoxia: Secondary | ICD-10-CM | POA: Diagnosis not present

## 2019-09-19 DIAGNOSIS — I129 Hypertensive chronic kidney disease with stage 1 through stage 4 chronic kidney disease, or unspecified chronic kidney disease: Secondary | ICD-10-CM | POA: Diagnosis not present

## 2019-09-19 DIAGNOSIS — K219 Gastro-esophageal reflux disease without esophagitis: Secondary | ICD-10-CM | POA: Diagnosis not present

## 2019-09-26 DIAGNOSIS — J9610 Chronic respiratory failure, unspecified whether with hypoxia or hypercapnia: Secondary | ICD-10-CM | POA: Diagnosis not present

## 2019-09-26 DIAGNOSIS — R0902 Hypoxemia: Secondary | ICD-10-CM | POA: Diagnosis not present

## 2019-09-26 DIAGNOSIS — G4733 Obstructive sleep apnea (adult) (pediatric): Secondary | ICD-10-CM | POA: Diagnosis not present

## 2019-09-26 DIAGNOSIS — J45909 Unspecified asthma, uncomplicated: Secondary | ICD-10-CM | POA: Diagnosis not present

## 2019-09-26 DIAGNOSIS — J4520 Mild intermittent asthma, uncomplicated: Secondary | ICD-10-CM | POA: Diagnosis not present

## 2019-10-05 ENCOUNTER — Other Ambulatory Visit (HOSPITAL_COMMUNITY)
Admission: RE | Admit: 2019-10-05 | Discharge: 2019-10-05 | Disposition: A | Discharge status: Home / Self Care | Payer: Medicare HMO | Source: Ambulatory Visit | Attending: Pulmonary Disease | Admitting: Pulmonary Disease

## 2019-10-05 DIAGNOSIS — Z01812 Encounter for preprocedural laboratory examination: Secondary | ICD-10-CM | POA: Diagnosis not present

## 2019-10-05 DIAGNOSIS — Z20822 Contact with and (suspected) exposure to covid-19: Secondary | ICD-10-CM | POA: Diagnosis not present

## 2019-10-05 LAB — SARS CORONAVIRUS 2 (TAT 6-24 HRS): SARS Coronavirus 2: NEGATIVE

## 2019-10-05 NOTE — Progress Notes (Signed)
Pt sts she tested + for covid in Feb/21. Pt no table to produce the results, so pt was tested today for her procedure on 4/27. If pt's results are +, it will be up to the ordering physician if the procedure will still take place. Pt was asymptomatic at the time of testing.

## 2019-10-06 DIAGNOSIS — J45909 Unspecified asthma, uncomplicated: Secondary | ICD-10-CM | POA: Diagnosis not present

## 2019-10-06 DIAGNOSIS — J9610 Chronic respiratory failure, unspecified whether with hypoxia or hypercapnia: Secondary | ICD-10-CM | POA: Diagnosis not present

## 2019-10-06 DIAGNOSIS — G4733 Obstructive sleep apnea (adult) (pediatric): Secondary | ICD-10-CM | POA: Diagnosis not present

## 2019-10-06 DIAGNOSIS — R0902 Hypoxemia: Secondary | ICD-10-CM | POA: Diagnosis not present

## 2019-10-08 ENCOUNTER — Other Ambulatory Visit: Payer: Self-pay

## 2019-10-08 ENCOUNTER — Ambulatory Visit (HOSPITAL_BASED_OUTPATIENT_CLINIC_OR_DEPARTMENT_OTHER): Payer: Medicare HMO | Attending: Primary Care | Admitting: Pulmonary Disease

## 2019-10-08 DIAGNOSIS — E662 Morbid (severe) obesity with alveolar hypoventilation: Secondary | ICD-10-CM

## 2019-10-08 DIAGNOSIS — G4733 Obstructive sleep apnea (adult) (pediatric): Secondary | ICD-10-CM | POA: Diagnosis not present

## 2019-10-13 DIAGNOSIS — G4733 Obstructive sleep apnea (adult) (pediatric): Secondary | ICD-10-CM | POA: Diagnosis not present

## 2019-10-13 DIAGNOSIS — J4520 Mild intermittent asthma, uncomplicated: Secondary | ICD-10-CM | POA: Diagnosis not present

## 2019-10-13 DIAGNOSIS — J45909 Unspecified asthma, uncomplicated: Secondary | ICD-10-CM | POA: Diagnosis not present

## 2019-10-13 DIAGNOSIS — J9610 Chronic respiratory failure, unspecified whether with hypoxia or hypercapnia: Secondary | ICD-10-CM | POA: Diagnosis not present

## 2019-10-17 ENCOUNTER — Telehealth: Payer: Self-pay | Admitting: Pulmonary Disease

## 2019-10-17 DIAGNOSIS — G4733 Obstructive sleep apnea (adult) (pediatric): Secondary | ICD-10-CM

## 2019-10-17 DIAGNOSIS — J9611 Chronic respiratory failure with hypoxia: Secondary | ICD-10-CM

## 2019-10-17 DIAGNOSIS — E662 Morbid (severe) obesity with alveolar hypoventilation: Secondary | ICD-10-CM

## 2019-10-17 NOTE — Telephone Encounter (Signed)
PSG 10/08/19 >> AHI 36.2, SpO2 low 67%.  Failed CPAP.  Did well with Bipap 21/17 cm H2O with 4 liters O2.   Please let her know her sleep study shows she still has severe sleep apnea.  She needs to have her Bipap setting adjusted to 21/17 cm H2O and needs to use 4 liters oxygen at night with Bipap.  I have sent orders to her DME for these changes.

## 2019-10-17 NOTE — Telephone Encounter (Signed)
Spoke with patient's caregiver Annabelle Harman. She verbalized understanding. She is aware the order was sent to [employer]. Advised her to call us back if she hasn't heard anything within the next week, to call us back.   Nothing further needed at time of call.

## 2019-10-17 NOTE — Procedures (Signed)
    Patient Name: Shelley [name], Shelley [name] Date: 10/08/2019 Gender: Female D.O.B: 1966-05-13 Age (years): 59 Referring Provider: Geraldo Pitter NP Height (inches): 65 Interpreting Physician: Chesley Mires MD, ABSM Weight (lbs): 211 RPSGT: Carolin Coy BMI: 35 MRN: [id number] Neck Size: 15.50  CLINICAL INFORMATION The patient is referred for a split night study with BPAP. Most recent polysomnogram was on 10/08/2019. Most recent titration study dated 02/20/2012 was optimal at 17.11cm H2O.  MEDICATIONS Medications self-administered by patient taken the night of the study : N/A  SLEEP STUDY TECHNIQUE As per the AASM Manual for the Scoring of Sleep and Associated Events v2.3 (April 2016) with a hypopnea requiring 4% desaturations.  The channels recorded and monitored were frontal, central and occipital EEG, electrooculogram (EOG), submentalis EMG (chin), nasal and oral airflow, thoracic and abdominal wall motion, anterior tibialis EMG, snore microphone, electrocardiogram, and pulse oximetry. Bi-level positive airway pressure (BiPAP) was initiated when the patient met split night criteria and was titrated according to treat sleep-disordered breathing.  RESPIRATORY PARAMETERS Diagnostic  Total AHI (/hr): 36.2 RDI (/hr): 54.7 OA Index (/hr): 11.7 CA Index (/hr): 1.1 REM AHI (/hr): 61.4 NREM AHI (/hr): 35.8 Supine AHI (/hr): N/A Non-supine AHI (/hr): 55.12 Min O2 Sat (%): 67.0 Mean O2 (%): 91.5 Time below 88% (min): 16.4   Titration  Optimal IPAP Pressure (cm): 21 Optimal EPAP Pressure (cm): 17 AHI at Optimal Pressure (/hr): 0.0 Min O2 at Optimal Pressure (%): 87.0 Sleep % at Optimal (%): 100 Supine % at Optimal (%): 0     SLEEP ARCHITECTURE The study was initiated at 10:09:39 PM and terminated at 5:26:52 AM. The total recorded time was 437.2 minutes. EEG confirmed total sleep time was 340.9 minutes yielding a sleep efficiency of 78.0%%. Sleep onset after lights out was 10.4 minutes  with a REM latency of 214.5 minutes. The patient spent 34.9%% of the night in stage N1 sleep, 27.9%% in stage N2 sleep, 0.0%% in stage N3 and 37.2% in REM. Wake after sleep onset (WASO) was 86.0 minutes. The Arousal Index was 47.7/hour.  LEG MOVEMENT DATA The total Periodic Limb Movements of Sleep (PLMS) were 0. The PLMS index was 0.0 .  CARDIAC DATA The 2 lead EKG demonstrated sinus rhythm. The mean heart rate was 100.0 beats per minute. Other EKG findings include: PVCs.  IMPRESSIONS - Severe obstructive sleep apnea with an AHI of 36.2 and SpO2 low of 67%. - She was tried on CPAP up to 17 cm H2O.  She continued to have respiratory events. - After transitioning to Bipap, she did well with Bipap 21/17 cm H2O. - She continued to have oxygen desaturations in the abscence of other respiratory events.  These lasted for more than 5 minutes.  These resolved once she had 4 liters oxygen added to Bipap 21/17 cm H2O.  DIAGNOSIS - Obstructive Sleep Apnea  - Sleep Related Hypoxia/Hypoventilation  RECOMMENDATIONS - She should continue Bipap with setting of 21/17 cm H2O with 4 liters oxygen. - She was fitted with a Medium size Resmed Full Face Mask AirFit F30 mask and heated humidification.  [Electronically signed] 10/17/2019 02:03 PM  Chesley Mires MD, [place], American Board of Sleep Medicine   NPI: [id number]

## 2019-10-26 DIAGNOSIS — J4520 Mild intermittent asthma, uncomplicated: Secondary | ICD-10-CM | POA: Diagnosis not present

## 2019-10-26 DIAGNOSIS — R0902 Hypoxemia: Secondary | ICD-10-CM | POA: Diagnosis not present

## 2019-10-26 DIAGNOSIS — J9610 Chronic respiratory failure, unspecified whether with hypoxia or hypercapnia: Secondary | ICD-10-CM | POA: Diagnosis not present

## 2019-10-26 DIAGNOSIS — G4733 Obstructive sleep apnea (adult) (pediatric): Secondary | ICD-10-CM | POA: Diagnosis not present

## 2019-10-26 DIAGNOSIS — J45909 Unspecified asthma, uncomplicated: Secondary | ICD-10-CM | POA: Diagnosis not present

## 2019-11-05 DIAGNOSIS — J9610 Chronic respiratory failure, unspecified whether with hypoxia or hypercapnia: Secondary | ICD-10-CM | POA: Diagnosis not present

## 2019-11-05 DIAGNOSIS — J45909 Unspecified asthma, uncomplicated: Secondary | ICD-10-CM | POA: Diagnosis not present

## 2019-11-05 DIAGNOSIS — G4733 Obstructive sleep apnea (adult) (pediatric): Secondary | ICD-10-CM | POA: Diagnosis not present

## 2019-11-05 DIAGNOSIS — R0902 Hypoxemia: Secondary | ICD-10-CM | POA: Diagnosis not present

## 2019-11-13 DIAGNOSIS — J4520 Mild intermittent asthma, uncomplicated: Secondary | ICD-10-CM | POA: Diagnosis not present

## 2019-11-13 DIAGNOSIS — J45909 Unspecified asthma, uncomplicated: Secondary | ICD-10-CM | POA: Diagnosis not present

## 2019-11-13 DIAGNOSIS — G4733 Obstructive sleep apnea (adult) (pediatric): Secondary | ICD-10-CM | POA: Diagnosis not present

## 2019-11-13 DIAGNOSIS — J9610 Chronic respiratory failure, unspecified whether with hypoxia or hypercapnia: Secondary | ICD-10-CM | POA: Diagnosis not present

## 2019-11-26 DIAGNOSIS — J45909 Unspecified asthma, uncomplicated: Secondary | ICD-10-CM | POA: Diagnosis not present

## 2019-11-26 DIAGNOSIS — J4520 Mild intermittent asthma, uncomplicated: Secondary | ICD-10-CM | POA: Diagnosis not present

## 2019-11-26 DIAGNOSIS — J9610 Chronic respiratory failure, unspecified whether with hypoxia or hypercapnia: Secondary | ICD-10-CM | POA: Diagnosis not present

## 2019-11-26 DIAGNOSIS — R0902 Hypoxemia: Secondary | ICD-10-CM | POA: Diagnosis not present

## 2019-11-26 DIAGNOSIS — G4733 Obstructive sleep apnea (adult) (pediatric): Secondary | ICD-10-CM | POA: Diagnosis not present

## 2019-12-02 DIAGNOSIS — K219 Gastro-esophageal reflux disease without esophagitis: Secondary | ICD-10-CM | POA: Diagnosis not present

## 2019-12-02 DIAGNOSIS — G3184 Mild cognitive impairment, so stated: Secondary | ICD-10-CM | POA: Diagnosis not present

## 2019-12-02 DIAGNOSIS — R69 Illness, unspecified: Secondary | ICD-10-CM | POA: Diagnosis not present

## 2019-12-02 DIAGNOSIS — E119 Type 2 diabetes mellitus without complications: Secondary | ICD-10-CM | POA: Diagnosis not present

## 2019-12-02 DIAGNOSIS — I10 Essential (primary) hypertension: Secondary | ICD-10-CM | POA: Diagnosis not present

## 2019-12-02 DIAGNOSIS — D509 Iron deficiency anemia, unspecified: Secondary | ICD-10-CM | POA: Diagnosis not present

## 2019-12-02 DIAGNOSIS — J9611 Chronic respiratory failure with hypoxia: Secondary | ICD-10-CM | POA: Diagnosis not present

## 2019-12-02 DIAGNOSIS — J45909 Unspecified asthma, uncomplicated: Secondary | ICD-10-CM | POA: Diagnosis not present

## 2019-12-02 DIAGNOSIS — E785 Hyperlipidemia, unspecified: Secondary | ICD-10-CM | POA: Diagnosis not present

## 2019-12-06 DIAGNOSIS — J9610 Chronic respiratory failure, unspecified whether with hypoxia or hypercapnia: Secondary | ICD-10-CM | POA: Diagnosis not present

## 2019-12-06 DIAGNOSIS — G4733 Obstructive sleep apnea (adult) (pediatric): Secondary | ICD-10-CM | POA: Diagnosis not present

## 2019-12-06 DIAGNOSIS — J45909 Unspecified asthma, uncomplicated: Secondary | ICD-10-CM | POA: Diagnosis not present

## 2019-12-06 DIAGNOSIS — R0902 Hypoxemia: Secondary | ICD-10-CM | POA: Diagnosis not present

## 2019-12-20 DIAGNOSIS — J4520 Mild intermittent asthma, uncomplicated: Secondary | ICD-10-CM | POA: Diagnosis not present

## 2019-12-20 DIAGNOSIS — J9610 Chronic respiratory failure, unspecified whether with hypoxia or hypercapnia: Secondary | ICD-10-CM | POA: Diagnosis not present

## 2019-12-20 DIAGNOSIS — G4733 Obstructive sleep apnea (adult) (pediatric): Secondary | ICD-10-CM | POA: Diagnosis not present

## 2019-12-20 DIAGNOSIS — J45909 Unspecified asthma, uncomplicated: Secondary | ICD-10-CM | POA: Diagnosis not present

## 2020-01-02 DIAGNOSIS — J069 Acute upper respiratory infection, unspecified: Secondary | ICD-10-CM | POA: Diagnosis not present

## 2020-01-02 DIAGNOSIS — R05 Cough: Secondary | ICD-10-CM | POA: Diagnosis not present

## 2020-01-05 DIAGNOSIS — J9610 Chronic respiratory failure, unspecified whether with hypoxia or hypercapnia: Secondary | ICD-10-CM | POA: Diagnosis not present

## 2020-01-05 DIAGNOSIS — G4733 Obstructive sleep apnea (adult) (pediatric): Secondary | ICD-10-CM | POA: Diagnosis not present

## 2020-01-05 DIAGNOSIS — J45909 Unspecified asthma, uncomplicated: Secondary | ICD-10-CM | POA: Diagnosis not present

## 2020-01-05 DIAGNOSIS — R0902 Hypoxemia: Secondary | ICD-10-CM | POA: Diagnosis not present

## 2020-01-13 DIAGNOSIS — R05 Cough: Secondary | ICD-10-CM | POA: Diagnosis not present

## 2020-01-13 DIAGNOSIS — B354 Tinea corporis: Secondary | ICD-10-CM | POA: Diagnosis not present

## 2020-01-13 DIAGNOSIS — J309 Allergic rhinitis, unspecified: Secondary | ICD-10-CM | POA: Diagnosis not present

## 2020-01-20 DIAGNOSIS — J4520 Mild intermittent asthma, uncomplicated: Secondary | ICD-10-CM | POA: Diagnosis not present

## 2020-01-20 DIAGNOSIS — J45909 Unspecified asthma, uncomplicated: Secondary | ICD-10-CM | POA: Diagnosis not present

## 2020-01-20 DIAGNOSIS — G4733 Obstructive sleep apnea (adult) (pediatric): Secondary | ICD-10-CM | POA: Diagnosis not present

## 2020-01-20 DIAGNOSIS — J9610 Chronic respiratory failure, unspecified whether with hypoxia or hypercapnia: Secondary | ICD-10-CM | POA: Diagnosis not present

## 2020-02-04 DIAGNOSIS — M6281 Muscle weakness (generalized): Secondary | ICD-10-CM | POA: Diagnosis not present

## 2020-02-04 DIAGNOSIS — R2681 Unsteadiness on feet: Secondary | ICD-10-CM | POA: Diagnosis not present

## 2020-02-05 DIAGNOSIS — J45909 Unspecified asthma, uncomplicated: Secondary | ICD-10-CM | POA: Diagnosis not present

## 2020-02-05 DIAGNOSIS — G4733 Obstructive sleep apnea (adult) (pediatric): Secondary | ICD-10-CM | POA: Diagnosis not present

## 2020-02-05 DIAGNOSIS — R0902 Hypoxemia: Secondary | ICD-10-CM | POA: Diagnosis not present

## 2020-02-05 DIAGNOSIS — J9610 Chronic respiratory failure, unspecified whether with hypoxia or hypercapnia: Secondary | ICD-10-CM | POA: Diagnosis not present

## 2020-02-10 DIAGNOSIS — R2681 Unsteadiness on feet: Secondary | ICD-10-CM | POA: Diagnosis not present

## 2020-02-10 DIAGNOSIS — M6281 Muscle weakness (generalized): Secondary | ICD-10-CM | POA: Diagnosis not present

## 2020-02-12 DIAGNOSIS — R2681 Unsteadiness on feet: Secondary | ICD-10-CM | POA: Diagnosis not present

## 2020-02-12 DIAGNOSIS — M6281 Muscle weakness (generalized): Secondary | ICD-10-CM | POA: Diagnosis not present

## 2020-02-18 DIAGNOSIS — M6281 Muscle weakness (generalized): Secondary | ICD-10-CM | POA: Diagnosis not present

## 2020-02-18 DIAGNOSIS — R2681 Unsteadiness on feet: Secondary | ICD-10-CM | POA: Diagnosis not present

## 2020-02-19 DIAGNOSIS — R2681 Unsteadiness on feet: Secondary | ICD-10-CM | POA: Diagnosis not present

## 2020-02-19 DIAGNOSIS — M6281 Muscle weakness (generalized): Secondary | ICD-10-CM | POA: Diagnosis not present

## 2020-02-20 DIAGNOSIS — J4520 Mild intermittent asthma, uncomplicated: Secondary | ICD-10-CM | POA: Diagnosis not present

## 2020-02-20 DIAGNOSIS — J45909 Unspecified asthma, uncomplicated: Secondary | ICD-10-CM | POA: Diagnosis not present

## 2020-02-20 DIAGNOSIS — G4733 Obstructive sleep apnea (adult) (pediatric): Secondary | ICD-10-CM | POA: Diagnosis not present

## 2020-02-20 DIAGNOSIS — J9610 Chronic respiratory failure, unspecified whether with hypoxia or hypercapnia: Secondary | ICD-10-CM | POA: Diagnosis not present

## 2020-02-21 ENCOUNTER — Ambulatory Visit: Payer: Medicare HMO | Admitting: Pulmonary Disease

## 2020-02-25 DIAGNOSIS — R2681 Unsteadiness on feet: Secondary | ICD-10-CM | POA: Diagnosis not present

## 2020-02-25 DIAGNOSIS — M6281 Muscle weakness (generalized): Secondary | ICD-10-CM | POA: Diagnosis not present

## 2020-02-28 DIAGNOSIS — M6281 Muscle weakness (generalized): Secondary | ICD-10-CM | POA: Diagnosis not present

## 2020-02-28 DIAGNOSIS — R2681 Unsteadiness on feet: Secondary | ICD-10-CM | POA: Diagnosis not present

## 2020-03-02 DIAGNOSIS — R2681 Unsteadiness on feet: Secondary | ICD-10-CM | POA: Diagnosis not present

## 2020-03-02 DIAGNOSIS — M6281 Muscle weakness (generalized): Secondary | ICD-10-CM | POA: Diagnosis not present

## 2020-03-06 DIAGNOSIS — R2681 Unsteadiness on feet: Secondary | ICD-10-CM | POA: Diagnosis not present

## 2020-03-06 DIAGNOSIS — M6281 Muscle weakness (generalized): Secondary | ICD-10-CM | POA: Diagnosis not present

## 2020-03-06 DIAGNOSIS — R69 Illness, unspecified: Secondary | ICD-10-CM | POA: Diagnosis not present

## 2020-03-07 DIAGNOSIS — J45909 Unspecified asthma, uncomplicated: Secondary | ICD-10-CM | POA: Diagnosis not present

## 2020-03-07 DIAGNOSIS — G4733 Obstructive sleep apnea (adult) (pediatric): Secondary | ICD-10-CM | POA: Diagnosis not present

## 2020-03-07 DIAGNOSIS — J9610 Chronic respiratory failure, unspecified whether with hypoxia or hypercapnia: Secondary | ICD-10-CM | POA: Diagnosis not present

## 2020-03-07 DIAGNOSIS — R0902 Hypoxemia: Secondary | ICD-10-CM | POA: Diagnosis not present

## 2020-03-10 DIAGNOSIS — M6281 Muscle weakness (generalized): Secondary | ICD-10-CM | POA: Diagnosis not present

## 2020-03-10 DIAGNOSIS — R2681 Unsteadiness on feet: Secondary | ICD-10-CM | POA: Diagnosis not present

## 2020-03-13 DIAGNOSIS — R2681 Unsteadiness on feet: Secondary | ICD-10-CM | POA: Diagnosis not present

## 2020-03-13 DIAGNOSIS — M6281 Muscle weakness (generalized): Secondary | ICD-10-CM | POA: Diagnosis not present

## 2020-03-16 DIAGNOSIS — R2681 Unsteadiness on feet: Secondary | ICD-10-CM | POA: Diagnosis not present

## 2020-03-16 DIAGNOSIS — M6281 Muscle weakness (generalized): Secondary | ICD-10-CM | POA: Diagnosis not present

## 2020-03-19 DIAGNOSIS — M6281 Muscle weakness (generalized): Secondary | ICD-10-CM | POA: Diagnosis not present

## 2020-03-19 DIAGNOSIS — R2681 Unsteadiness on feet: Secondary | ICD-10-CM | POA: Diagnosis not present

## 2020-03-24 DIAGNOSIS — M6281 Muscle weakness (generalized): Secondary | ICD-10-CM | POA: Diagnosis not present

## 2020-03-24 DIAGNOSIS — R2681 Unsteadiness on feet: Secondary | ICD-10-CM | POA: Diagnosis not present

## 2020-03-26 DIAGNOSIS — R2681 Unsteadiness on feet: Secondary | ICD-10-CM | POA: Diagnosis not present

## 2020-03-26 DIAGNOSIS — M6281 Muscle weakness (generalized): Secondary | ICD-10-CM | POA: Diagnosis not present

## 2020-04-03 DIAGNOSIS — R2681 Unsteadiness on feet: Secondary | ICD-10-CM | POA: Diagnosis not present

## 2020-04-03 DIAGNOSIS — M6281 Muscle weakness (generalized): Secondary | ICD-10-CM | POA: Diagnosis not present

## 2020-04-06 DIAGNOSIS — J45909 Unspecified asthma, uncomplicated: Secondary | ICD-10-CM | POA: Diagnosis not present

## 2020-04-06 DIAGNOSIS — R0902 Hypoxemia: Secondary | ICD-10-CM | POA: Diagnosis not present

## 2020-04-06 DIAGNOSIS — M6281 Muscle weakness (generalized): Secondary | ICD-10-CM | POA: Diagnosis not present

## 2020-04-06 DIAGNOSIS — G4733 Obstructive sleep apnea (adult) (pediatric): Secondary | ICD-10-CM | POA: Diagnosis not present

## 2020-04-06 DIAGNOSIS — R2681 Unsteadiness on feet: Secondary | ICD-10-CM | POA: Diagnosis not present

## 2020-04-06 DIAGNOSIS — J9610 Chronic respiratory failure, unspecified whether with hypoxia or hypercapnia: Secondary | ICD-10-CM | POA: Diagnosis not present

## 2020-04-09 DIAGNOSIS — Z20828 Contact with and (suspected) exposure to other viral communicable diseases: Secondary | ICD-10-CM | POA: Diagnosis not present

## 2020-04-09 DIAGNOSIS — M6281 Muscle weakness (generalized): Secondary | ICD-10-CM | POA: Diagnosis not present

## 2020-04-09 DIAGNOSIS — R2681 Unsteadiness on feet: Secondary | ICD-10-CM | POA: Diagnosis not present

## 2020-04-09 DIAGNOSIS — Z03818 Encounter for observation for suspected exposure to other biological agents ruled out: Secondary | ICD-10-CM | POA: Diagnosis not present

## 2020-04-14 DIAGNOSIS — M6281 Muscle weakness (generalized): Secondary | ICD-10-CM | POA: Diagnosis not present

## 2020-04-14 DIAGNOSIS — R2681 Unsteadiness on feet: Secondary | ICD-10-CM | POA: Diagnosis not present

## 2020-04-16 DIAGNOSIS — R2681 Unsteadiness on feet: Secondary | ICD-10-CM | POA: Diagnosis not present

## 2020-04-16 DIAGNOSIS — M6281 Muscle weakness (generalized): Secondary | ICD-10-CM | POA: Diagnosis not present

## 2020-04-21 DIAGNOSIS — R2681 Unsteadiness on feet: Secondary | ICD-10-CM | POA: Diagnosis not present

## 2020-04-21 DIAGNOSIS — M6281 Muscle weakness (generalized): Secondary | ICD-10-CM | POA: Diagnosis not present

## 2020-04-22 DIAGNOSIS — M6281 Muscle weakness (generalized): Secondary | ICD-10-CM | POA: Diagnosis not present

## 2020-04-22 DIAGNOSIS — R2681 Unsteadiness on feet: Secondary | ICD-10-CM | POA: Diagnosis not present

## 2020-04-30 DIAGNOSIS — R2681 Unsteadiness on feet: Secondary | ICD-10-CM | POA: Diagnosis not present

## 2020-04-30 DIAGNOSIS — M6281 Muscle weakness (generalized): Secondary | ICD-10-CM | POA: Diagnosis not present

## 2020-05-03 DIAGNOSIS — M6281 Muscle weakness (generalized): Secondary | ICD-10-CM | POA: Diagnosis not present

## 2020-05-03 DIAGNOSIS — R2681 Unsteadiness on feet: Secondary | ICD-10-CM | POA: Diagnosis not present

## 2020-05-05 DIAGNOSIS — M6281 Muscle weakness (generalized): Secondary | ICD-10-CM | POA: Diagnosis not present

## 2020-05-05 DIAGNOSIS — R2681 Unsteadiness on feet: Secondary | ICD-10-CM | POA: Diagnosis not present

## 2020-05-07 DIAGNOSIS — J45909 Unspecified asthma, uncomplicated: Secondary | ICD-10-CM | POA: Diagnosis not present

## 2020-05-07 DIAGNOSIS — J9610 Chronic respiratory failure, unspecified whether with hypoxia or hypercapnia: Secondary | ICD-10-CM | POA: Diagnosis not present

## 2020-05-07 DIAGNOSIS — R0902 Hypoxemia: Secondary | ICD-10-CM | POA: Diagnosis not present

## 2020-05-07 DIAGNOSIS — G4733 Obstructive sleep apnea (adult) (pediatric): Secondary | ICD-10-CM | POA: Diagnosis not present

## 2020-05-14 DIAGNOSIS — R2681 Unsteadiness on feet: Secondary | ICD-10-CM | POA: Diagnosis not present

## 2020-05-14 DIAGNOSIS — M6281 Muscle weakness (generalized): Secondary | ICD-10-CM | POA: Diagnosis not present

## 2020-05-19 DIAGNOSIS — M6281 Muscle weakness (generalized): Secondary | ICD-10-CM | POA: Diagnosis not present

## 2020-05-19 DIAGNOSIS — R2681 Unsteadiness on feet: Secondary | ICD-10-CM | POA: Diagnosis not present

## 2020-05-20 DIAGNOSIS — J9611 Chronic respiratory failure with hypoxia: Secondary | ICD-10-CM | POA: Diagnosis not present

## 2020-05-20 DIAGNOSIS — K219 Gastro-esophageal reflux disease without esophagitis: Secondary | ICD-10-CM | POA: Diagnosis not present

## 2020-05-20 DIAGNOSIS — N181 Chronic kidney disease, stage 1: Secondary | ICD-10-CM | POA: Diagnosis not present

## 2020-05-20 DIAGNOSIS — Q2730 Arteriovenous malformation, site unspecified: Secondary | ICD-10-CM | POA: Diagnosis not present

## 2020-05-20 DIAGNOSIS — I129 Hypertensive chronic kidney disease with stage 1 through stage 4 chronic kidney disease, or unspecified chronic kidney disease: Secondary | ICD-10-CM | POA: Diagnosis not present

## 2020-05-20 DIAGNOSIS — E1121 Type 2 diabetes mellitus with diabetic nephropathy: Secondary | ICD-10-CM | POA: Diagnosis not present

## 2020-05-20 DIAGNOSIS — D509 Iron deficiency anemia, unspecified: Secondary | ICD-10-CM | POA: Diagnosis not present

## 2020-05-20 DIAGNOSIS — J4520 Mild intermittent asthma, uncomplicated: Secondary | ICD-10-CM | POA: Diagnosis not present

## 2020-05-20 DIAGNOSIS — Z6829 Body mass index (BMI) 29.0-29.9, adult: Secondary | ICD-10-CM | POA: Diagnosis not present

## 2020-05-20 DIAGNOSIS — K649 Unspecified hemorrhoids: Secondary | ICD-10-CM | POA: Diagnosis not present

## 2020-05-22 DIAGNOSIS — R2681 Unsteadiness on feet: Secondary | ICD-10-CM | POA: Diagnosis not present

## 2020-05-22 DIAGNOSIS — M6281 Muscle weakness (generalized): Secondary | ICD-10-CM | POA: Diagnosis not present

## 2020-06-06 DIAGNOSIS — J9610 Chronic respiratory failure, unspecified whether with hypoxia or hypercapnia: Secondary | ICD-10-CM | POA: Diagnosis not present

## 2020-06-06 DIAGNOSIS — J45909 Unspecified asthma, uncomplicated: Secondary | ICD-10-CM | POA: Diagnosis not present

## 2020-06-06 DIAGNOSIS — G4733 Obstructive sleep apnea (adult) (pediatric): Secondary | ICD-10-CM | POA: Diagnosis not present

## 2020-06-06 DIAGNOSIS — R0902 Hypoxemia: Secondary | ICD-10-CM | POA: Diagnosis not present

## 2020-07-01 DIAGNOSIS — K219 Gastro-esophageal reflux disease without esophagitis: Secondary | ICD-10-CM | POA: Diagnosis not present

## 2020-07-01 DIAGNOSIS — G4733 Obstructive sleep apnea (adult) (pediatric): Secondary | ICD-10-CM | POA: Diagnosis not present

## 2020-07-01 DIAGNOSIS — E785 Hyperlipidemia, unspecified: Secondary | ICD-10-CM | POA: Diagnosis not present

## 2020-07-01 DIAGNOSIS — J9611 Chronic respiratory failure with hypoxia: Secondary | ICD-10-CM | POA: Diagnosis not present

## 2020-07-01 DIAGNOSIS — I10 Essential (primary) hypertension: Secondary | ICD-10-CM | POA: Diagnosis not present

## 2020-07-01 DIAGNOSIS — E119 Type 2 diabetes mellitus without complications: Secondary | ICD-10-CM | POA: Diagnosis not present

## 2020-07-01 DIAGNOSIS — R69 Illness, unspecified: Secondary | ICD-10-CM | POA: Diagnosis not present

## 2020-07-01 DIAGNOSIS — J45909 Unspecified asthma, uncomplicated: Secondary | ICD-10-CM | POA: Diagnosis not present

## 2020-07-07 DIAGNOSIS — J9610 Chronic respiratory failure, unspecified whether with hypoxia or hypercapnia: Secondary | ICD-10-CM | POA: Diagnosis not present

## 2020-07-07 DIAGNOSIS — R0902 Hypoxemia: Secondary | ICD-10-CM | POA: Diagnosis not present

## 2020-07-07 DIAGNOSIS — J45909 Unspecified asthma, uncomplicated: Secondary | ICD-10-CM | POA: Diagnosis not present

## 2020-07-07 DIAGNOSIS — G4733 Obstructive sleep apnea (adult) (pediatric): Secondary | ICD-10-CM | POA: Diagnosis not present

## 2020-08-07 DIAGNOSIS — J9610 Chronic respiratory failure, unspecified whether with hypoxia or hypercapnia: Secondary | ICD-10-CM | POA: Diagnosis not present

## 2020-08-07 DIAGNOSIS — J45909 Unspecified asthma, uncomplicated: Secondary | ICD-10-CM | POA: Diagnosis not present

## 2020-08-07 DIAGNOSIS — R0902 Hypoxemia: Secondary | ICD-10-CM | POA: Diagnosis not present

## 2020-08-07 DIAGNOSIS — G4733 Obstructive sleep apnea (adult) (pediatric): Secondary | ICD-10-CM | POA: Diagnosis not present

## 2020-09-04 DIAGNOSIS — J9610 Chronic respiratory failure, unspecified whether with hypoxia or hypercapnia: Secondary | ICD-10-CM | POA: Diagnosis not present

## 2020-09-04 DIAGNOSIS — J45909 Unspecified asthma, uncomplicated: Secondary | ICD-10-CM | POA: Diagnosis not present

## 2020-09-04 DIAGNOSIS — G4733 Obstructive sleep apnea (adult) (pediatric): Secondary | ICD-10-CM | POA: Diagnosis not present

## 2020-09-04 DIAGNOSIS — R0902 Hypoxemia: Secondary | ICD-10-CM | POA: Diagnosis not present

## 2020-09-11 DIAGNOSIS — R2689 Other abnormalities of gait and mobility: Secondary | ICD-10-CM | POA: Diagnosis not present

## 2020-09-11 DIAGNOSIS — M6281 Muscle weakness (generalized): Secondary | ICD-10-CM | POA: Diagnosis not present

## 2020-09-16 DIAGNOSIS — M6281 Muscle weakness (generalized): Secondary | ICD-10-CM | POA: Diagnosis not present

## 2020-09-16 DIAGNOSIS — R2689 Other abnormalities of gait and mobility: Secondary | ICD-10-CM | POA: Diagnosis not present

## 2020-09-18 DIAGNOSIS — R2689 Other abnormalities of gait and mobility: Secondary | ICD-10-CM | POA: Diagnosis not present

## 2020-09-18 DIAGNOSIS — M6281 Muscle weakness (generalized): Secondary | ICD-10-CM | POA: Diagnosis not present

## 2020-09-24 DIAGNOSIS — R2689 Other abnormalities of gait and mobility: Secondary | ICD-10-CM | POA: Diagnosis not present

## 2020-09-24 DIAGNOSIS — M6281 Muscle weakness (generalized): Secondary | ICD-10-CM | POA: Diagnosis not present

## 2020-09-28 DIAGNOSIS — M6281 Muscle weakness (generalized): Secondary | ICD-10-CM | POA: Diagnosis not present

## 2020-09-28 DIAGNOSIS — R2689 Other abnormalities of gait and mobility: Secondary | ICD-10-CM | POA: Diagnosis not present

## 2020-09-29 DIAGNOSIS — R2689 Other abnormalities of gait and mobility: Secondary | ICD-10-CM | POA: Diagnosis not present

## 2020-09-29 DIAGNOSIS — M6281 Muscle weakness (generalized): Secondary | ICD-10-CM | POA: Diagnosis not present

## 2020-10-01 DIAGNOSIS — M6281 Muscle weakness (generalized): Secondary | ICD-10-CM | POA: Diagnosis not present

## 2020-10-01 DIAGNOSIS — R2689 Other abnormalities of gait and mobility: Secondary | ICD-10-CM | POA: Diagnosis not present

## 2020-10-05 DIAGNOSIS — J9610 Chronic respiratory failure, unspecified whether with hypoxia or hypercapnia: Secondary | ICD-10-CM | POA: Diagnosis not present

## 2020-10-05 DIAGNOSIS — G4733 Obstructive sleep apnea (adult) (pediatric): Secondary | ICD-10-CM | POA: Diagnosis not present

## 2020-10-05 DIAGNOSIS — J45909 Unspecified asthma, uncomplicated: Secondary | ICD-10-CM | POA: Diagnosis not present

## 2020-10-05 DIAGNOSIS — R0902 Hypoxemia: Secondary | ICD-10-CM | POA: Diagnosis not present

## 2020-10-06 DIAGNOSIS — R2689 Other abnormalities of gait and mobility: Secondary | ICD-10-CM | POA: Diagnosis not present

## 2020-10-06 DIAGNOSIS — M6281 Muscle weakness (generalized): Secondary | ICD-10-CM | POA: Diagnosis not present

## 2020-10-09 DIAGNOSIS — M6281 Muscle weakness (generalized): Secondary | ICD-10-CM | POA: Diagnosis not present

## 2020-10-09 DIAGNOSIS — R2689 Other abnormalities of gait and mobility: Secondary | ICD-10-CM | POA: Diagnosis not present

## 2020-10-13 DIAGNOSIS — R2689 Other abnormalities of gait and mobility: Secondary | ICD-10-CM | POA: Diagnosis not present

## 2020-10-13 DIAGNOSIS — M6281 Muscle weakness (generalized): Secondary | ICD-10-CM | POA: Diagnosis not present

## 2020-10-16 DIAGNOSIS — R2689 Other abnormalities of gait and mobility: Secondary | ICD-10-CM | POA: Diagnosis not present

## 2020-10-16 DIAGNOSIS — M6281 Muscle weakness (generalized): Secondary | ICD-10-CM | POA: Diagnosis not present

## 2020-10-19 DIAGNOSIS — M6281 Muscle weakness (generalized): Secondary | ICD-10-CM | POA: Diagnosis not present

## 2020-10-19 DIAGNOSIS — R2689 Other abnormalities of gait and mobility: Secondary | ICD-10-CM | POA: Diagnosis not present

## 2020-11-04 DIAGNOSIS — J45909 Unspecified asthma, uncomplicated: Secondary | ICD-10-CM | POA: Diagnosis not present

## 2020-11-04 DIAGNOSIS — G4733 Obstructive sleep apnea (adult) (pediatric): Secondary | ICD-10-CM | POA: Diagnosis not present

## 2020-11-04 DIAGNOSIS — R0902 Hypoxemia: Secondary | ICD-10-CM | POA: Diagnosis not present

## 2020-11-04 DIAGNOSIS — J9610 Chronic respiratory failure, unspecified whether with hypoxia or hypercapnia: Secondary | ICD-10-CM | POA: Diagnosis not present

## 2020-11-24 DIAGNOSIS — R269 Unspecified abnormalities of gait and mobility: Secondary | ICD-10-CM | POA: Diagnosis not present

## 2020-11-24 DIAGNOSIS — F39 Unspecified mood [affective] disorder: Secondary | ICD-10-CM | POA: Diagnosis not present

## 2020-11-24 DIAGNOSIS — K219 Gastro-esophageal reflux disease without esophagitis: Secondary | ICD-10-CM | POA: Diagnosis not present

## 2020-11-24 DIAGNOSIS — E1121 Type 2 diabetes mellitus with diabetic nephropathy: Secondary | ICD-10-CM | POA: Diagnosis not present

## 2020-11-24 DIAGNOSIS — Z1389 Encounter for screening for other disorder: Secondary | ICD-10-CM | POA: Diagnosis not present

## 2020-11-24 DIAGNOSIS — Z1331 Encounter for screening for depression: Secondary | ICD-10-CM | POA: Diagnosis not present

## 2020-11-24 DIAGNOSIS — Z0000 Encounter for general adult medical examination without abnormal findings: Secondary | ICD-10-CM | POA: Diagnosis not present

## 2020-11-24 DIAGNOSIS — S50819A Abrasion of unspecified forearm, initial encounter: Secondary | ICD-10-CM | POA: Diagnosis not present

## 2020-11-24 DIAGNOSIS — D509 Iron deficiency anemia, unspecified: Secondary | ICD-10-CM | POA: Diagnosis not present

## 2020-11-24 DIAGNOSIS — R69 Illness, unspecified: Secondary | ICD-10-CM | POA: Diagnosis not present

## 2020-11-24 DIAGNOSIS — E782 Mixed hyperlipidemia: Secondary | ICD-10-CM | POA: Diagnosis not present

## 2020-11-24 DIAGNOSIS — I129 Hypertensive chronic kidney disease with stage 1 through stage 4 chronic kidney disease, or unspecified chronic kidney disease: Secondary | ICD-10-CM | POA: Diagnosis not present

## 2020-12-05 DIAGNOSIS — R0902 Hypoxemia: Secondary | ICD-10-CM | POA: Diagnosis not present

## 2020-12-05 DIAGNOSIS — J45909 Unspecified asthma, uncomplicated: Secondary | ICD-10-CM | POA: Diagnosis not present

## 2020-12-05 DIAGNOSIS — J9610 Chronic respiratory failure, unspecified whether with hypoxia or hypercapnia: Secondary | ICD-10-CM | POA: Diagnosis not present

## 2020-12-05 DIAGNOSIS — G4733 Obstructive sleep apnea (adult) (pediatric): Secondary | ICD-10-CM | POA: Diagnosis not present

## 2021-01-04 DIAGNOSIS — J45909 Unspecified asthma, uncomplicated: Secondary | ICD-10-CM | POA: Diagnosis not present

## 2021-01-04 DIAGNOSIS — G4733 Obstructive sleep apnea (adult) (pediatric): Secondary | ICD-10-CM | POA: Diagnosis not present

## 2021-01-04 DIAGNOSIS — J9610 Chronic respiratory failure, unspecified whether with hypoxia or hypercapnia: Secondary | ICD-10-CM | POA: Diagnosis not present

## 2021-01-04 DIAGNOSIS — R0902 Hypoxemia: Secondary | ICD-10-CM | POA: Diagnosis not present

## 2021-02-04 DIAGNOSIS — G4733 Obstructive sleep apnea (adult) (pediatric): Secondary | ICD-10-CM | POA: Diagnosis not present

## 2021-02-04 DIAGNOSIS — R0902 Hypoxemia: Secondary | ICD-10-CM | POA: Diagnosis not present

## 2021-02-04 DIAGNOSIS — J9610 Chronic respiratory failure, unspecified whether with hypoxia or hypercapnia: Secondary | ICD-10-CM | POA: Diagnosis not present

## 2021-02-04 DIAGNOSIS — J45909 Unspecified asthma, uncomplicated: Secondary | ICD-10-CM | POA: Diagnosis not present

## 2021-03-07 DIAGNOSIS — G4733 Obstructive sleep apnea (adult) (pediatric): Secondary | ICD-10-CM | POA: Diagnosis not present

## 2021-03-07 DIAGNOSIS — J9610 Chronic respiratory failure, unspecified whether with hypoxia or hypercapnia: Secondary | ICD-10-CM | POA: Diagnosis not present

## 2021-03-07 DIAGNOSIS — R0902 Hypoxemia: Secondary | ICD-10-CM | POA: Diagnosis not present

## 2021-03-07 DIAGNOSIS — J45909 Unspecified asthma, uncomplicated: Secondary | ICD-10-CM | POA: Diagnosis not present

## 2021-03-26 DIAGNOSIS — Z23 Encounter for immunization: Secondary | ICD-10-CM | POA: Diagnosis not present

## 2021-04-06 DIAGNOSIS — J9610 Chronic respiratory failure, unspecified whether with hypoxia or hypercapnia: Secondary | ICD-10-CM | POA: Diagnosis not present

## 2021-04-06 DIAGNOSIS — J45909 Unspecified asthma, uncomplicated: Secondary | ICD-10-CM | POA: Diagnosis not present

## 2021-04-06 DIAGNOSIS — R0902 Hypoxemia: Secondary | ICD-10-CM | POA: Diagnosis not present

## 2021-04-06 DIAGNOSIS — G4733 Obstructive sleep apnea (adult) (pediatric): Secondary | ICD-10-CM | POA: Diagnosis not present

## 2021-05-05 DIAGNOSIS — W19XXXA Unspecified fall, initial encounter: Secondary | ICD-10-CM | POA: Diagnosis not present

## 2021-05-05 DIAGNOSIS — T148XXA Other injury of unspecified body region, initial encounter: Secondary | ICD-10-CM | POA: Diagnosis not present

## 2021-05-07 DIAGNOSIS — J45909 Unspecified asthma, uncomplicated: Secondary | ICD-10-CM | POA: Diagnosis not present

## 2021-05-07 DIAGNOSIS — R0902 Hypoxemia: Secondary | ICD-10-CM | POA: Diagnosis not present

## 2021-05-07 DIAGNOSIS — G4733 Obstructive sleep apnea (adult) (pediatric): Secondary | ICD-10-CM | POA: Diagnosis not present

## 2021-05-07 DIAGNOSIS — J9610 Chronic respiratory failure, unspecified whether with hypoxia or hypercapnia: Secondary | ICD-10-CM | POA: Diagnosis not present

## 2021-05-18 DIAGNOSIS — R2689 Other abnormalities of gait and mobility: Secondary | ICD-10-CM | POA: Diagnosis not present

## 2021-05-18 DIAGNOSIS — R296 Repeated falls: Secondary | ICD-10-CM | POA: Diagnosis not present

## 2021-05-20 DIAGNOSIS — R2689 Other abnormalities of gait and mobility: Secondary | ICD-10-CM | POA: Diagnosis not present

## 2021-05-20 DIAGNOSIS — R296 Repeated falls: Secondary | ICD-10-CM | POA: Diagnosis not present

## 2021-05-21 DIAGNOSIS — R2689 Other abnormalities of gait and mobility: Secondary | ICD-10-CM | POA: Diagnosis not present

## 2021-05-21 DIAGNOSIS — R296 Repeated falls: Secondary | ICD-10-CM | POA: Diagnosis not present

## 2021-05-25 DIAGNOSIS — R296 Repeated falls: Secondary | ICD-10-CM | POA: Diagnosis not present

## 2021-05-25 DIAGNOSIS — R2689 Other abnormalities of gait and mobility: Secondary | ICD-10-CM | POA: Diagnosis not present

## 2021-05-26 ENCOUNTER — Encounter (HOSPITAL_COMMUNITY): Payer: Self-pay

## 2021-05-26 ENCOUNTER — Other Ambulatory Visit: Payer: Self-pay

## 2021-05-26 ENCOUNTER — Emergency Department (HOSPITAL_COMMUNITY): Payer: Medicare HMO

## 2021-05-26 ENCOUNTER — Emergency Department (HOSPITAL_COMMUNITY)
Admission: EM | Admit: 2021-05-26 | Discharge: 2021-05-26 | Disposition: A | Discharge status: Home / Self Care | Payer: Medicare HMO | Attending: Emergency Medicine | Admitting: Emergency Medicine

## 2021-05-26 DIAGNOSIS — R69 Illness, unspecified: Secondary | ICD-10-CM | POA: Diagnosis not present

## 2021-05-26 DIAGNOSIS — S99921A Unspecified injury of right foot, initial encounter: Secondary | ICD-10-CM | POA: Diagnosis not present

## 2021-05-26 DIAGNOSIS — Z7984 Long term (current) use of oral hypoglycemic drugs: Secondary | ICD-10-CM | POA: Insufficient documentation

## 2021-05-26 DIAGNOSIS — X509XXA Other and unspecified overexertion or strenuous movements or postures, initial encounter: Secondary | ICD-10-CM | POA: Diagnosis not present

## 2021-05-26 DIAGNOSIS — J4540 Moderate persistent asthma, uncomplicated: Secondary | ICD-10-CM | POA: Diagnosis not present

## 2021-05-26 DIAGNOSIS — I10 Essential (primary) hypertension: Secondary | ICD-10-CM | POA: Diagnosis not present

## 2021-05-26 DIAGNOSIS — S93601A Unspecified sprain of right foot, initial encounter: Secondary | ICD-10-CM | POA: Diagnosis not present

## 2021-05-26 DIAGNOSIS — Z79899 Other long term (current) drug therapy: Secondary | ICD-10-CM | POA: Insufficient documentation

## 2021-05-26 DIAGNOSIS — R2689 Other abnormalities of gait and mobility: Secondary | ICD-10-CM | POA: Diagnosis not present

## 2021-05-26 DIAGNOSIS — E119 Type 2 diabetes mellitus without complications: Secondary | ICD-10-CM | POA: Insufficient documentation

## 2021-05-26 DIAGNOSIS — R531 Weakness: Secondary | ICD-10-CM | POA: Diagnosis not present

## 2021-05-26 DIAGNOSIS — M79672 Pain in left foot: Secondary | ICD-10-CM | POA: Diagnosis not present

## 2021-05-26 DIAGNOSIS — W19XXXA Unspecified fall, initial encounter: Secondary | ICD-10-CM | POA: Diagnosis not present

## 2021-05-26 DIAGNOSIS — M7989 Other specified soft tissue disorders: Secondary | ICD-10-CM | POA: Diagnosis not present

## 2021-05-26 DIAGNOSIS — R296 Repeated falls: Secondary | ICD-10-CM | POA: Diagnosis not present

## 2021-05-26 DIAGNOSIS — Z743 Need for continuous supervision: Secondary | ICD-10-CM | POA: Diagnosis not present

## 2021-05-26 NOTE — ED Provider Notes (Signed)
[facility]   CSN: [id number] Arrival date & time: 05/26/21  1216     History   Shelley [name] is a 55 y.o. female.  HPI   Pt presents to the ED with complaints of foot pain.  Pt states she twisted her foot last night and hit it on a door.  She is having pain now in her right foot.  Pt states it hurts to walk.  She is not having any trouble with chest pain, shortness of breath.  No numbness or weakness.  No fevers or chills.  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   Asthma, mild intermittent 11/01/2018   Combined fat and carbohydrate induced hyperlipemia 11/01/2018   Incontinence 11/01/2018   Acute renal failure (ARF) (HCC)    AKI (acute kidney injury) (HCC) 05/09/2016   Acute on chronic respiratory failure (HCC) 05/09/2016   Chronic constipation 05/05/2016   AVM (arteriovenous malformation) of colon 05/05/2016   Acute lower GI bleeding 05/03/2016   Rectal bleeding 03/11/2016   Hyponatremia 03/11/2016   Acute blood loss anemia    Bleeding gastrointestinal    Obesity hypoventilation syndrome (HCC)    BiPAP (biphasic positive airway pressure) dependence    Diabetes mellitus type 2, noninsulin dependent (HCC)    Symptomatic anemia 07/30/2015   Anemia 07/29/2015   Lower GI bleed 07/29/2015   Diverticulosis 07/29/2015   History of arteriovenous malformation (AVM) 07/29/2015   Gastro-esophageal reflux disease without esophagitis 07/29/2015   Hemorrhoid 06/19/2015   Bleeding internal  hemorrhoids 03/06/2013   Dependence on supplemental oxygen 03/06/2013   Anxiety    Mental retardation    Iron deficiency anemia due to chronic blood loss 02/06/2013   Hypokalemia 02/06/2013   Diabetes mellitus (HCC) 02/06/2013   Physical deconditioning 11/08/2012   Major depression 05/15/2012   Cognitive developmental delay 12/02/2011   Obstructive sleep apnea 12/02/2011   Chronic respiratory failure (HCC) 12/02/2011   Combined hyperlipidemia associated with type 2 diabetes mellitus (HCC) 08/18/2011   Benign essential hypertension 08/10/2010   Moderate persistent asthma 08/10/2010   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   Colon, diverticulosis 12/08/2009   ALLERGIC RHINITIS 08/02/2007    Past Surgical History:  Procedure Laterality Date   CHOLECYSTECTOMY     2006   COLONOSCOPY N/A 02/06/2013   Procedure: COLONOSCOPY;  Surgeon: Theda BelfastPatrick D Hung, MD;  Location: [facility] ENDOSCOPY;  Service: Endoscopy;  Laterality: N/A;   COLONOSCOPY N/A 07/31/2015   Procedure: COLONOSCOPY;  Surgeon: Jeani HawkingPatrick Hung, MD;  [facility];  Service: Endoscopy;  Laterality: N/A;   ESOPHAGOGASTRODUODENOSCOPY N/A 02/06/2013   Procedure: ESOPHAGOGASTRODUODENOSCOPY (EGD);  Surgeon: Theda BelfastPatrick D Hung, MD;  [facility];  Service: Endoscopy;  Laterality: N/A;   EYE SURGERY     FLEXIBLE SIGMOIDOSCOPY N/A 09/18/2015   Procedure: FLEXIBLE SIGMOIDOSCOPY;  Surgeon: Jeani HawkingPatrick Hung, MD;  [facility];  Service: Endoscopy;  Laterality: N/A;   HEMORRHOID SURGERY N/A 05/06/2016  Procedure: HEMORRHOIDECTOMY, HEMORRHOID LIGATION;  Surgeon: Karie Soda, MD;  Location: [facility] ORS;  Service: General;  Laterality: N/A;   HOT HEMOSTASIS N/A 07/31/2015   Procedure: HOT HEMOSTASIS (ARGON PLASMA COAGULATION/BICAP);  Surgeon: Jeani Hawking, MD;  Location: [facility] ENDOSCOPY;  Service: Endoscopy;  Laterality: N/A;     OB History   No obstetric history on file.     Family History  Problem Relation Age of Onset    COPD Mother    Bone cancer Father     Social History   Tobacco Use   Smoking status: Never   Smokeless tobacco: Never  Vaping Use   Vaping Use: Never used  Substance Use Topics   Alcohol use: No   Drug use: No    Home Medications Prior to Admission medications   Medication Sig Start Date End Date Taking? Authorizing Provider  acetaminophen (TYLENOL) 500 MG tablet Take 500 mg by mouth every 6 (six) hours as needed.    [provider]  albuterol (PROVENTIL) (2.5 MG/3ML) 0.083% nebulizer solution TAKE 3 MLS (2.5 MG TOTAL) BY NEBULIZATION EVERY 6 HOURS IF NEEDED FOR WHEEZING/SHORTNESS OF BREATH 12/10/18   Glenford Bayley, NP  atorvastatin (LIPITOR) 10 MG tablet Take 10 mg by mouth daily.    [provider]  FERREX 150 150 MG capsule Take 150 mg by mouth daily. 07/22/15   [provider]  hydrochlorothiazide (HYDRODIURIL) 12.5 MG tablet Take 12.5 mg by mouth every morning. 10/08/18   [provider]  loratadine (CLARITIN) 10 MG tablet Take 10 mg by mouth daily as needed for allergies.     [provider]  losartan (COZAAR) 100 MG tablet Take 100 mg by mouth daily. 10/08/18   [provider]  losartan-hydrochlorothiazide (HYZAAR) 100-12.5 MG tablet Take 1 tablet by mouth daily.  03/17/16   [provider]  metFORMIN (GLUCOPHAGE) 500 MG tablet Take 1,000 mg by mouth every evening.     [provider]  montelukast (SINGULAIR) 10 MG tablet Take 10 mg by mouth at bedtime.    [provider]  MYRBETRIQ 25 MG TB24 tablet Take 25 mg by mouth daily. 10/24/18   [provider]  omeprazole (PRILOSEC) 40 MG capsule Take 40 mg by mouth daily.  04/04/16   [provider]  Potassium Chloride ER 20 MEQ TBCR Take 1 tablet by mouth daily. with food 06/04/18   [provider]  potassium chloride SA (K-DUR) 20 MEQ tablet Take 20 mEq by mouth daily. with food 01/02/19   [provider]   sertraline (ZOLOFT) 100 MG tablet Take 200 mg by mouth daily. 01/02/19   [provider]  sertraline (ZOLOFT) 50 MG tablet Take 100 mg by mouth daily.     [provider]  traMADol (ULTRAM) 50 MG tablet Take 1 tablet (50 mg total) by mouth every 6 (six) hours as needed. Patient taking differently: Take 50 mg by mouth every 6 (six) hours as needed for moderate pain.  07/11/18   Ward, Layla Maw, DO    Allergies    Fish allergy, Pepto-bismol [bismuth subsalicylate], Cortisone, and Trichophyton  Review of Systems   Review of Systems  All other systems reviewed and are negative.  Physical Exam Updated Vital Signs BP 124/81 (BP Location: Right Arm)    Pulse (!) 106    Temp 99.9 F (37.7 C) (Oral)    Resp 20    LMP 09/27/2012    SpO2 94%   Physical Exam Vitals and nursing  note reviewed.  Constitutional:      General: She is not in acute distress.    Appearance: She is well-developed.  HENT:     Head: Normocephalic and atraumatic.     Right Ear: External ear normal.     Left Ear: External ear normal.  Eyes:     General: No scleral icterus.       Right eye: No discharge.        Left eye: No discharge.     Conjunctiva/sclera: Conjunctivae normal.  Neck:     Trachea: No tracheal deviation.  Cardiovascular:     Rate and Rhythm: Normal rate.  Pulmonary:     Effort: Pulmonary effort is normal. No respiratory distress.     Breath sounds: No stridor.  Abdominal:     General: There is no distension.  Musculoskeletal:        General: Swelling and tenderness present. No deformity.     Cervical back: Neck supple.     Comments: Mild ttp right midfoot, no ankle tenderness, no leg ttp.  Skin:    General: Skin is warm and dry.     Findings: No rash.  Neurological:     Mental Status: She is alert.     Cranial Nerves: Cranial nerve deficit: no gross deficits.    ED Results / Procedures / Treatments   Labs (all labs ordered are listed, but only abnormal results are  displayed) Labs Reviewed - No data to display  EKG None  Radiology DG Foot Complete Right  Result Date: 05/26/2021 CLINICAL DATA:  Fall with right dorsal foot pain. EXAM: RIGHT FOOT COMPLETE - 3+ VIEW COMPARISON:  None. FINDINGS: Dorsal soft tissue swelling about the midfoot on the lateral film. Small calcaneal spur. No acute fracture or dislocation. IMPRESSION: Soft tissue swelling, without acute osseous abnormality. Electronically Signed   By: Abigail Miyamoto M.D.   On: 05/26/2021 13:48    Procedures Procedures   Medications Ordered in ED Medications - No data to display  ED Course  I have reviewed the triage vital signs and the nursing notes.  Pertinent labs & imaging results that were available during my care of the patient were reviewed by me and considered in my medical decision making (see chart for details).  Clinical Course as of 05/26/21 1515  Wed May 26, 2021  1457 Xray without acute findings [JK]    Clinical Course User Index [JK] Dorie Rank, MD   MDM Rules/Calculators/A&P                           Xray negative for fx.  Consistent with contusion, strain.  Will provide aso splint for comfort.  Stable for discharge. Final Clinical Impression(s) / ED Diagnoses Final diagnoses:  Foot sprain, right, initial encounter    Rx / DC Orders ED Discharge Orders          Ordered    Walker rolling        05/26/21 1515             Dorie Rank, MD 05/26/21 1515

## 2021-05-26 NOTE — ED Provider Notes (Signed)
Emergency Medicine Provider Triage Evaluation Note  Shelley [name] , a 55 y.o. female  was evaluated in triage.  Pt complains of right foot pain after injury / fall last night. Patient endorses 8/10 pain with walking. Denies any other symptoms at this time, including chills, diaphoresis, chest pain, shortness of breath, nausea, vomiting. Is on oxygen at baseline. Denies numbness / tingling of right foot.  Review of Systems  Positive: Foot pain Negative: As above  Physical Exam  BP 124/81 (BP Location: Right Arm)    Pulse (!) 106    Temp 99.9 F (37.7 C) (Oral)    Resp 20    LMP 09/27/2012    SpO2 94%  Gen:   Awake, no distress   Resp:  Normal effort  MSK:   Moves extremities without difficulty  Other:  Ttp medial malleolus minimally, midfoot on right significant. No deformity noted, mild swelling  Pulse recheck at 98bpm on my exam  Medical Decision Making  Medically screening exam initiated at 1:17 PM.  Appropriate orders placed.  Kamri A Dewalt was informed that the remainder of the evaluation will be completed by another provider, this initial triage assessment does not replace that evaluation, and the importance of remaining in the ED until their evaluation is complete.  Foot pain.   [place] 05/26/21 1319    Bethann Berkshire, MD 05/26/21 1623

## 2021-05-26 NOTE — ED Triage Notes (Signed)
Per EMS, frequent falls due to "weakness" but appears to be anxiety related-coming from [facility]-took EMS and fire to lift her onto stretcher-complaining of right foot pain

## 2021-05-26 NOTE — Discharge Instructions (Signed)
Take over the counter medications as needed, use the walker and brace as needed for comfort

## 2021-05-27 DIAGNOSIS — R296 Repeated falls: Secondary | ICD-10-CM | POA: Diagnosis not present

## 2021-05-27 DIAGNOSIS — R2689 Other abnormalities of gait and mobility: Secondary | ICD-10-CM | POA: Diagnosis not present

## 2021-05-31 DIAGNOSIS — R296 Repeated falls: Secondary | ICD-10-CM | POA: Diagnosis not present

## 2021-05-31 DIAGNOSIS — R2689 Other abnormalities of gait and mobility: Secondary | ICD-10-CM | POA: Diagnosis not present

## 2021-06-01 DIAGNOSIS — R296 Repeated falls: Secondary | ICD-10-CM | POA: Diagnosis not present

## 2021-06-01 DIAGNOSIS — R2689 Other abnormalities of gait and mobility: Secondary | ICD-10-CM | POA: Diagnosis not present

## 2021-06-02 DIAGNOSIS — J9611 Chronic respiratory failure with hypoxia: Secondary | ICD-10-CM | POA: Diagnosis not present

## 2021-06-02 DIAGNOSIS — E782 Mixed hyperlipidemia: Secondary | ICD-10-CM | POA: Diagnosis not present

## 2021-06-02 DIAGNOSIS — R2689 Other abnormalities of gait and mobility: Secondary | ICD-10-CM | POA: Diagnosis not present

## 2021-06-02 DIAGNOSIS — R69 Illness, unspecified: Secondary | ICD-10-CM | POA: Diagnosis not present

## 2021-06-02 DIAGNOSIS — E1121 Type 2 diabetes mellitus with diabetic nephropathy: Secondary | ICD-10-CM | POA: Diagnosis not present

## 2021-06-02 DIAGNOSIS — R269 Unspecified abnormalities of gait and mobility: Secondary | ICD-10-CM | POA: Diagnosis not present

## 2021-06-02 DIAGNOSIS — R32 Unspecified urinary incontinence: Secondary | ICD-10-CM | POA: Diagnosis not present

## 2021-06-02 DIAGNOSIS — I129 Hypertensive chronic kidney disease with stage 1 through stage 4 chronic kidney disease, or unspecified chronic kidney disease: Secondary | ICD-10-CM | POA: Diagnosis not present

## 2021-06-03 DIAGNOSIS — R2689 Other abnormalities of gait and mobility: Secondary | ICD-10-CM | POA: Diagnosis not present

## 2021-06-03 DIAGNOSIS — R296 Repeated falls: Secondary | ICD-10-CM | POA: Diagnosis not present

## 2021-06-06 DIAGNOSIS — G4733 Obstructive sleep apnea (adult) (pediatric): Secondary | ICD-10-CM | POA: Diagnosis not present

## 2021-06-06 DIAGNOSIS — R0902 Hypoxemia: Secondary | ICD-10-CM | POA: Diagnosis not present

## 2021-06-06 DIAGNOSIS — J45909 Unspecified asthma, uncomplicated: Secondary | ICD-10-CM | POA: Diagnosis not present

## 2021-06-06 DIAGNOSIS — J9610 Chronic respiratory failure, unspecified whether with hypoxia or hypercapnia: Secondary | ICD-10-CM | POA: Diagnosis not present

## 2021-06-08 DIAGNOSIS — R2689 Other abnormalities of gait and mobility: Secondary | ICD-10-CM | POA: Diagnosis not present

## 2021-06-08 DIAGNOSIS — R296 Repeated falls: Secondary | ICD-10-CM | POA: Diagnosis not present

## 2021-06-09 DIAGNOSIS — R296 Repeated falls: Secondary | ICD-10-CM | POA: Diagnosis not present

## 2021-06-09 DIAGNOSIS — R2689 Other abnormalities of gait and mobility: Secondary | ICD-10-CM | POA: Diagnosis not present

## 2021-06-11 ENCOUNTER — Other Ambulatory Visit: Payer: Self-pay

## 2021-06-11 ENCOUNTER — Ambulatory Visit: Payer: Medicare HMO | Admitting: Primary Care

## 2021-06-11 ENCOUNTER — Encounter: Payer: Self-pay | Admitting: Primary Care

## 2021-06-11 VITALS — BP 130/70 | HR 94 | Temp 99.5°F | Ht 64.0 in | Wt 232.2 lb

## 2021-06-11 DIAGNOSIS — J9611 Chronic respiratory failure with hypoxia: Secondary | ICD-10-CM | POA: Diagnosis not present

## 2021-06-11 DIAGNOSIS — G4733 Obstructive sleep apnea (adult) (pediatric): Secondary | ICD-10-CM

## 2021-06-11 NOTE — Patient Instructions (Signed)
°  Continue to wear oxygen 24/7  You need to start wearing your BIPAP again every night  I would recommend bring her supplies and machine to [facility] to see what supplies she needs or is missing  Orders: Walk patient to qualify for POC  Please provide patient with [employer] phone number  Follow-up: 6-8 weeks with Dr. Craige Cotta or Waynetta Sandy NP

## 2021-06-11 NOTE — Progress Notes (Signed)
@Patient  ID: , female    DOB: [birth year], 55 y.o.   MRN: [id number]  Chief Complaint  Patient presents with   Follow-up    osa    Referring provider: [id number], MD  HPI: 55 year old female, never smoked. PMH significant for OSA, obesity hypoventilation syndrome, moderate persistent asthma, chronic respiratory failure, HTN, type 2 diabetes, AVM, diverticulosis, GERD, hemorrhoids, hyperlipidemia, cognitive developmental delay, anxiety/depression, obesity. Patient of Dr. 53. Difficulty with BiPAP compliance, uses 4L oxygen at night. Maintained on Singulair and prn albuterol neb.   Previous [facility] pulmonary encounter: 09/12/2019 Patient presents today for follow-up visit. She is accompanied by caregiver. She is doing very well. She did get new BIPAP machine from [facility] but has not been wearing it. States that she forget to put it on. She does wear oxygen at night. Denies nocturnal apnea symptoms or waking up short of breath. She has lost another 9 lbs for a total of 30 lbs since 2019. No recent asthma exacerbations. Continues to take Singulair 10mg  daily. Doing fine off Pulmicort nebulizer. Denies shortness of breath, wheezing or cough.   06/11/2021- Interim hx  Patient presents today for 6 month follow-up. Accompanied by care-take from facility. She has been having falls getting into the elevator d/t anxiety. She was moved to the first floor. She has fallen 6-7 times in the last month. She has been evaluated in the ED. She is receiving physical therapy. She was not using her oxygen, reports that she desaturates with ambulation. She is now wearing her oxygen all the time and her caretaker reports that she appears to be doing better. She is not wearing BIPAP at night. They would like her to be evaluated for POC. She often runs out of oxygen tanks. If insurance will not cover her POA has said that they will cover. DME company is [employer]. Denies shortness of breath, wheezing or  cough  No BIPAP download in airview BIPAP Pressure 17/11 Mask mirage quattro size small  Humidifier level 4  Sleep tests: PSG 09/13/10>>AHI 98.6, SpO2 60% ONO with CPAP and 3 liters 01/13/12>>Test time 7 hrs 6 min. Mean SpO2 89%, low SpO2 62%. Spent 2 hrs 34 min with SpO2 < 88% CPAP 12/16/11 to 01/14/12>>Used on 6 of 30 nights with average 7 hrs 3 min. Average AHI 1.8 with CPAP 10 cm H2O. ONO with CPAP and 3 liters 01/13/12 >> Test time 7 hrs 6 min.  Mean SpO2 89%, low SpO2 62%.  Spent 2 hrs 34 min with SpO2 < 88%. ONO with CPAP and 4 liters 01/31/12 >> Test time 7 hrs 36 min.  Mean SpO2 91.6%, low SpO2 73%.  Spent 56 min with SpO2 < 88%. BPAP 02/20/12>>17/11 cm H2O with 4 liters oxygen.  Allergies  Allergen Reactions   Fish Allergy Swelling   Pepto-Bismol [Bismuth Subsalicylate] Nausea And Vomiting   Cortisone Itching and Rash    Injections and cream only.  PO ok.   Trichophyton Itching    Also sneezing accompanying as well    Immunization History  Administered Date(s) Administered   H1N1 05/26/2008   Influenza Split 03/13/2009, 03/15/2010, 03/07/2012, 03/13/2013, 04/17/2015, 03/07/2016, 02/21/2017, 04/23/2020   Influenza Whole 03/14/2011   Influenza,inj,Quad PF,6+ Mos 03/13/2014, 03/13/2015, 02/22/2016, 08/04/2017, 03/14/2018, 02/20/2019   Influenza,inj,quad, With Preservative 04/04/2014   Moderna Covid-19 Vaccine Bivalent Booster 15yrs & up 05/14/2021   Moderna Sars-Covid-2 Vaccination 07/15/2019, 08/12/2019, 04/20/2020, 01/09/2021   Pneumococcal Polysaccharide-23 12/28/2002, 01/15/2007   Pneumococcal-Unspecified 03/13/2010   Td 06/13/2006,  12/28/2006   Tdap 07/24/2018, 03/17/2019    Past Medical History:  Diagnosis Date   Anxiety    Arthritis    Asthma    AVM (arteriovenous malformation) of colon    Diabetes mellitus    Diverticulosis    GERD (gastroesophageal reflux disease)    GI bleed 09/2015   Hemorrhoids    Internal and external   Hiatal hernia     Hyperlipidemia    Hypertension    Mental retardation    Pneumonia     Tobacco History: Social History   Tobacco Use  Smoking Status Never  Smokeless Tobacco Never   Counseling given: Not Answered   Outpatient Medications Prior to Visit  Medication Sig Dispense Refill   acetaminophen (TYLENOL) 500 MG tablet Take 500 mg by mouth every 6 (six) hours as needed.     albuterol (PROVENTIL) (2.5 MG/3ML) 0.083% nebulizer solution TAKE 3 MLS (2.5 MG TOTAL) BY NEBULIZATION EVERY 6 HOURS IF NEEDED FOR WHEEZING/SHORTNESS OF BREATH 360 mL 6   atorvastatin (LIPITOR) 10 MG tablet Take 10 mg by mouth daily.     FERREX 150 150 MG capsule Take 150 mg by mouth daily.  6   hydrochlorothiazide (HYDRODIURIL) 12.5 MG tablet Take 12.5 mg by mouth every morning.     loratadine (CLARITIN) 10 MG tablet Take 10 mg by mouth daily as needed for allergies.      losartan (COZAAR) 100 MG tablet Take 100 mg by mouth daily.     losartan-hydrochlorothiazide (HYZAAR) 100-12.5 MG tablet Take 1 tablet by mouth daily.      metFORMIN (GLUCOPHAGE) 500 MG tablet Take 1,000 mg by mouth every evening.      montelukast (SINGULAIR) 10 MG tablet Take 10 mg by mouth at bedtime.     MYRBETRIQ 25 MG TB24 tablet Take 25 mg by mouth daily.     omeprazole (PRILOSEC) 40 MG capsule Take 40 mg by mouth daily.      Potassium Chloride ER 20 MEQ TBCR Take 1 tablet by mouth daily. with food     potassium chloride SA (K-DUR) 20 MEQ tablet Take 20 mEq by mouth daily. with food     sertraline (ZOLOFT) 100 MG tablet Take 200 mg by mouth daily.     sertraline (ZOLOFT) 50 MG tablet Take 100 mg by mouth daily.      traMADol (ULTRAM) 50 MG tablet Take 1 tablet (50 mg total) by mouth every 6 (six) hours as needed. (Patient taking differently: Take 50 mg by mouth every 6 (six) hours as needed for moderate pain.) 15 tablet 0   No facility-administered medications prior to visit.      Review of Systems  Review of Systems  Constitutional:  Negative.   HENT: Negative.    Respiratory: Negative.  Negative for cough, chest tightness, shortness of breath and wheezing.   Neurological: Negative.     Physical Exam  BP 130/70 (BP Location: Right Arm, Cuff Size: Normal)    Pulse 94    Temp 99.5 F (37.5 C) (Oral)    Ht 5\' 4"  (1.626 m)    Wt 232 lb 3.2 oz (105.3 kg)    LMP 09/27/2012    SpO2 99% Comment: 2L   BMI 39.86 kg/m  Physical Exam Constitutional:      Appearance: Normal appearance.  HENT:     Head: Normocephalic and atraumatic.  Cardiovascular:     Rate and Rhythm: Normal rate and regular rhythm.     Comments: No leg  swelling Pulmonary:     Effort: Pulmonary effort is normal.     Breath sounds: Normal breath sounds.  Neurological:     General: No focal deficit present.     Mental Status: She is alert and oriented to person, place, and time. Mental status is at baseline.  Psychiatric:        Mood and Affect: Mood normal.        Behavior: Behavior normal.     Lab Results:  CBC    Component Value Date/Time   WBC 3.0 (L) 07/10/2018 2225   RBC 3.67 (L) 07/10/2018 2225   HGB 9.7 (L) 07/10/2018 2225   HCT 32.6 (L) 07/10/2018 2225   PLT 127 (L) 07/10/2018 2225   MCV 88.8 07/10/2018 2225   MCH 26.4 07/10/2018 2225   MCHC 29.8 (L) 07/10/2018 2225   RDW 13.6 07/10/2018 2225   LYMPHSABS 0.7 07/10/2018 2225   MONOABS 0.5 07/10/2018 2225   EOSABS 0.0 07/10/2018 2225   BASOSABS 0.0 07/10/2018 2225    BMET    Component Value Date/Time   NA 132 (L) 07/10/2018 2225   K 3.5 07/10/2018 2225   CL 85 (L) 07/10/2018 2225   CO2 39 (H) 07/10/2018 2225   GLUCOSE 291 (H) 07/10/2018 2225   BUN 14 07/10/2018 2225   CREATININE 0.80 07/10/2018 2225   CALCIUM 8.3 (L) 07/10/2018 2225   GFRNONAA >60 07/10/2018 2225   GFRAA >60 07/10/2018 2225    BNP No results found for: BNP  ProBNP    Component Value Date/Time   PROBNP 32.7 11/29/2011 2332    Imaging: DG Foot Complete Right  Result Date: 05/26/2021 CLINICAL  DATA:  Fall with right dorsal foot pain. EXAM: RIGHT FOOT COMPLETE - 3+ VIEW COMPARISON:  None. FINDINGS: Dorsal soft tissue swelling about the midfoot on the lateral film. Small calcaneal spur. No acute fracture or dislocation. IMPRESSION: Soft tissue swelling, without acute osseous abnormality. Electronically Signed   By: Jeronimo Greaves M.D.   On: 05/26/2021 13:48     Assessment & Plan:   Chronic respiratory failure (HCC) - Patient reports having more falls at her facility, she started wearing oxygen 24/7 and is reports doing better. She was only able to walk one lap today, lowest her O2 dropped was 92% RA. We have placed an order for best fit for POC.   Obstructive sleep apnea - Patient is not wearing BIPAP, no data in airview. Strongly encourage patient resume use and discussed risk of not treating her OSA. BIPAP pressure 17/11. DME is [facility]. FU in 6-8 weeks with Dr. Craige Cotta or Waynetta Sandy NP for compliance check.      Glenford Bayley, NP 06/14/2021

## 2021-06-14 NOTE — Assessment & Plan Note (Signed)
-   Patient reports having more falls at her facility, she started wearing oxygen 24/7 and is reports doing better. She was only able to walk one lap today, lowest her O2 dropped was 92% RA. We have placed an order for best fit for POC.

## 2021-06-14 NOTE — Assessment & Plan Note (Addendum)
-   Patient is not wearing BIPAP, no data in airview. Strongly encourage patient resume use and discussed risk of not treating her OSA. BIPAP pressure 17/11. DME is [facility]. FU in 6-8 weeks with Dr. Halford Chessman or Eustaquio Maize NP for compliance check.

## 2021-06-15 NOTE — Progress Notes (Signed)
Reviewed and agree with assessment/plan.   [name], MD [facility] Pulmonary/Critical Care 06/15/2021, 8:05 AM Pager:  [phone]

## 2021-06-18 DIAGNOSIS — R296 Repeated falls: Secondary | ICD-10-CM | POA: Diagnosis not present

## 2021-06-18 DIAGNOSIS — R2689 Other abnormalities of gait and mobility: Secondary | ICD-10-CM | POA: Diagnosis not present

## 2021-06-23 DIAGNOSIS — R2689 Other abnormalities of gait and mobility: Secondary | ICD-10-CM | POA: Diagnosis not present

## 2021-06-23 DIAGNOSIS — R296 Repeated falls: Secondary | ICD-10-CM | POA: Diagnosis not present

## 2021-06-25 DIAGNOSIS — R296 Repeated falls: Secondary | ICD-10-CM | POA: Diagnosis not present

## 2021-06-25 DIAGNOSIS — R2689 Other abnormalities of gait and mobility: Secondary | ICD-10-CM | POA: Diagnosis not present

## 2021-06-30 DIAGNOSIS — R296 Repeated falls: Secondary | ICD-10-CM | POA: Diagnosis not present

## 2021-06-30 DIAGNOSIS — R2689 Other abnormalities of gait and mobility: Secondary | ICD-10-CM | POA: Diagnosis not present

## 2021-07-05 DIAGNOSIS — R2689 Other abnormalities of gait and mobility: Secondary | ICD-10-CM | POA: Diagnosis not present

## 2021-07-05 DIAGNOSIS — R296 Repeated falls: Secondary | ICD-10-CM | POA: Diagnosis not present

## 2021-07-07 DIAGNOSIS — J9610 Chronic respiratory failure, unspecified whether with hypoxia or hypercapnia: Secondary | ICD-10-CM | POA: Diagnosis not present

## 2021-07-07 DIAGNOSIS — J45909 Unspecified asthma, uncomplicated: Secondary | ICD-10-CM | POA: Diagnosis not present

## 2021-07-07 DIAGNOSIS — R0902 Hypoxemia: Secondary | ICD-10-CM | POA: Diagnosis not present

## 2021-07-07 DIAGNOSIS — G4733 Obstructive sleep apnea (adult) (pediatric): Secondary | ICD-10-CM | POA: Diagnosis not present

## 2021-07-08 DIAGNOSIS — R296 Repeated falls: Secondary | ICD-10-CM | POA: Diagnosis not present

## 2021-07-08 DIAGNOSIS — R2689 Other abnormalities of gait and mobility: Secondary | ICD-10-CM | POA: Diagnosis not present

## 2021-07-14 DIAGNOSIS — R2689 Other abnormalities of gait and mobility: Secondary | ICD-10-CM | POA: Diagnosis not present

## 2021-07-14 DIAGNOSIS — R296 Repeated falls: Secondary | ICD-10-CM | POA: Diagnosis not present

## 2021-07-16 DIAGNOSIS — R296 Repeated falls: Secondary | ICD-10-CM | POA: Diagnosis not present

## 2021-07-16 DIAGNOSIS — R2689 Other abnormalities of gait and mobility: Secondary | ICD-10-CM | POA: Diagnosis not present

## 2021-07-19 DIAGNOSIS — R2689 Other abnormalities of gait and mobility: Secondary | ICD-10-CM | POA: Diagnosis not present

## 2021-07-19 DIAGNOSIS — R296 Repeated falls: Secondary | ICD-10-CM | POA: Diagnosis not present

## 2021-07-21 DIAGNOSIS — R296 Repeated falls: Secondary | ICD-10-CM | POA: Diagnosis not present

## 2021-07-21 DIAGNOSIS — R2689 Other abnormalities of gait and mobility: Secondary | ICD-10-CM | POA: Diagnosis not present

## 2021-07-22 ENCOUNTER — Telehealth: Payer: Self-pay | Admitting: *Deleted

## 2021-07-22 NOTE — Telephone Encounter (Signed)
Called and spoke with patient's caregiver Annabelle Harman Cullins) as patient is mentally challenged.  She verified that the patient is not wearing a CPAP machine, she refuses to wear it.  She is wearing the oxygen.  She wanted to verify the time of her appointment, she thought it was at 12 pm.  I let her know it was at 10 am and they would need to arrive by 9:45 am for check in.  She verbalized understanding.  Nothing further needed.

## 2021-07-23 ENCOUNTER — Ambulatory Visit: Payer: Medicare HMO | Admitting: Pulmonary Disease

## 2021-07-23 ENCOUNTER — Encounter: Payer: Self-pay | Admitting: Pulmonary Disease

## 2021-07-23 ENCOUNTER — Other Ambulatory Visit: Payer: Self-pay

## 2021-07-23 VITALS — BP 126/72 | HR 111 | Temp 99.1°F | Ht 64.0 in | Wt 227.6 lb

## 2021-07-23 DIAGNOSIS — J9611 Chronic respiratory failure with hypoxia: Secondary | ICD-10-CM | POA: Diagnosis not present

## 2021-07-23 DIAGNOSIS — E662 Morbid (severe) obesity with alveolar hypoventilation: Secondary | ICD-10-CM | POA: Diagnosis not present

## 2021-07-23 DIAGNOSIS — J4530 Mild persistent asthma, uncomplicated: Secondary | ICD-10-CM

## 2021-07-23 DIAGNOSIS — Z23 Encounter for immunization: Secondary | ICD-10-CM | POA: Diagnosis not present

## 2021-07-23 DIAGNOSIS — G4733 Obstructive sleep apnea (adult) (pediatric): Secondary | ICD-10-CM

## 2021-07-23 NOTE — Addendum Note (Signed)
Addended by: Wyvonne Lenz on: 07/23/2021 10:51 AM   Modules accepted: Orders

## 2021-07-23 NOTE — Progress Notes (Signed)
[facility]  Chief Complaint  Patient presents with   Follow-up    Pt states she has been doing okay since last visit.    Past Surgical History:  She  has a past surgical history that includes Cholecystectomy; Eye surgery; Colonoscopy (N/A, 02/06/2013); Esophagogastroduodenoscopy (N/A, 02/06/2013); Colonoscopy (N/A, 07/31/2015); Hot hemostasis (N/A, 07/31/2015); Flexible sigmoidoscopy (N/A, 09/18/2015); and Hemorrhoid surgery (N/A, 05/06/2016).  Past Medical History:  PNA, Mental retardation, HTN, Hiatal hernia, GERD, Diverticulosis, GI bleed from colon AVM, DM, OA, Anxiety  Constitutional:  BP 126/72 (BP Location: Right Arm, Patient Position: Sitting, Cuff Size: Large)    Pulse (!) 111    Temp 99.1 F (37.3 C) (Oral)    Ht 5\' 4"  (1.626 m)    Wt 227 lb 9.6 oz (103.2 kg)    LMP 09/27/2012    SpO2 96% Comment: 2L cont   BMI 39.07 kg/m   Brief Summary:  Shelley [name] is a 56 y.o. female with obstructive sleep apnea, obesity hypoventilation syndrome, chronic respiratory failure and asthma.      Subjective:   She is here with her aide.  I last saw her in September 2020.  More recently she has been seen by October 2020.  Her weight was 223 lbs when I last saw her.  She was told by [employer] that she wasn't allowed to get a POC.  She has trouble filling oxygen tanks, and gets confused whether these are filled.  Her oxygen tank today was in the red even though she thought it was full.  She stopped using Bipap.  She didn't understand why she needed to use Bipap and oxygen.  She is not having cough, wheeze, or sputum.  SpO2 dropped to 87% on room air after walking in office today.  She was recovered on 2 liters pulsed oxygen to > 92%.  Physical Exam:   Appearance - well kempt, wearing oxygen  ENMT - no sinus tenderness, no oral exudate, no LAN, Mallampati 3 airway, no stridor  Respiratory - equal breath sounds bilaterally, no wheezing or  rales  CV - s1s2 regular rate and rhythm, no murmurs  Ext - no clubbing, no edema  Skin - no rashes  Psych - normal mood and affect   Sleep Tests:  PSG 09/13/10 >> AHI 98.6, SpO2 60% ONO with CPAP and 3 liters 01/13/12 >> Test time 7 hrs 6 min. Mean SpO2 89%, low SpO2 62%. Spent 2 hrs 34 min with SpO2 < 88% ONO with CPAP and 3 liters 01/13/12 >> Test time 7 hrs 6 min.  Mean SpO2 89%, low SpO2 62%.  Spent 2 hrs 34 min with SpO2 < 88%. ONO with CPAP and 4 liters 01/31/12 >> Test time 7 hrs 36 min.  Mean SpO2 91.6%, low SpO2 73%.  Spent 56 min with SpO2 < 88%. BPAP 02/20/12 >> 17/11 cm H2O with 4 liters oxygen. PSG 10/08/19 >> AHI 36.2, SpO2 low 67%.  Failed CPAP.  Did well with Bipap 21/17 cm H2O with 4 liters O2.  Social History:  She  reports that she has never smoked. She has never used smokeless tobacco. She reports that she does not drink alcohol and does not use drugs.  Family History:  Her family history includes Bone cancer in her father; COPD in her mother.     Assessment/Plan:   Obstructive sleep apnea. - explained how sleep apnea can impact her health and that supplemental oxygen is not sufficient to control  sleep apnea - will arrange for split night sleep study again to assess current status of sleep apnea, and then arrange for PAP therapy again  Chronic respiratory failure from obesity hypoventilation syndrome. - discussed importance of weight loss - continue 2 liters oxygen 24/7 - will see if she can switch to an Inogen POC  Mild, persistent asthma. - continue singulair 10 mg qhs - prn albuterol  Time Spent Involved in Patient Care on Day of Examination:  36 minutes  Follow up:   Patient Instructions  Pneumonia 20 vaccine today  Will arrange for an Inogen portable oxygen concentrator  Will arrange for in lab sleep study and call to schedule follow up after sleep study reviewed  Medication List:   Allergies as of 07/23/2021       Reactions   Fish  Allergy Swelling   Pepto-bismol [bismuth Subsalicylate] Nausea And Vomiting   Cortisone Itching, Rash   Injections and cream only.  PO ok.   Trichophyton Itching   Also sneezing accompanying as well        Medication List        Accurate as of July 23, 2021 10:16 AM. If you have any questions, ask your nurse or doctor.          acetaminophen 500 MG tablet Commonly known as: TYLENOL Take 500 mg by mouth every 6 (six) hours as needed.   albuterol (2.5 MG/3ML) 0.083% nebulizer solution Commonly known as: PROVENTIL TAKE 3 MLS (2.5 MG TOTAL) BY NEBULIZATION EVERY 6 HOURS IF NEEDED FOR WHEEZING/SHORTNESS OF BREATH   atorvastatin 10 MG tablet Commonly known as: LIPITOR Take 10 mg by mouth daily.   Ferrex 150 150 MG capsule Generic drug: iron polysaccharides Take 150 mg by mouth daily.   hydrochlorothiazide 12.5 MG tablet Commonly known as: HYDRODIURIL Take 12.5 mg by mouth every morning.   loratadine 10 MG tablet Commonly known as: CLARITIN Take 10 mg by mouth daily as needed for allergies.   losartan 100 MG tablet Commonly known as: COZAAR Take 100 mg by mouth daily.   losartan-hydrochlorothiazide 100-12.5 MG tablet Commonly known as: HYZAAR Take 1 tablet by mouth daily.   metFORMIN 500 MG tablet Commonly known as: GLUCOPHAGE Take 1,000 mg by mouth every evening.   montelukast 10 MG tablet Commonly known as: SINGULAIR Take 10 mg by mouth at bedtime.   Myrbetriq 25 MG Tb24 tablet Generic drug: mirabegron ER Take 25 mg by mouth daily.   omeprazole 40 MG capsule Commonly known as: PRILOSEC Take 40 mg by mouth daily.   Potassium Chloride ER 20 MEQ Tbcr Take 1 tablet by mouth daily. with food   potassium chloride SA 20 MEQ tablet Commonly known as: KLOR-CON M Take 20 mEq by mouth daily. with food   sertraline 100 MG tablet Commonly known as: ZOLOFT Take 200 mg by mouth daily.   sertraline 50 MG tablet Commonly known as: ZOLOFT Take 100 mg by  mouth daily.   traMADol 50 MG tablet Commonly known as: ULTRAM Take 1 tablet (50 mg total) by mouth every 6 (six) hours as needed. What changed: reasons to take this        Signature:  [name], MD [facility] Pager - [phone] 07/23/2021, 10:16 AM

## 2021-07-23 NOTE — Patient Instructions (Signed)
Pneumonia 20 vaccine today  Will arrange for an Inogen portable oxygen concentrator  Will arrange for in lab sleep study and call to schedule follow up after sleep study reviewed

## 2021-07-27 DIAGNOSIS — J45909 Unspecified asthma, uncomplicated: Secondary | ICD-10-CM | POA: Diagnosis not present

## 2021-07-27 DIAGNOSIS — J4520 Mild intermittent asthma, uncomplicated: Secondary | ICD-10-CM | POA: Diagnosis not present

## 2021-07-27 DIAGNOSIS — G4733 Obstructive sleep apnea (adult) (pediatric): Secondary | ICD-10-CM | POA: Diagnosis not present

## 2021-07-27 DIAGNOSIS — J9610 Chronic respiratory failure, unspecified whether with hypoxia or hypercapnia: Secondary | ICD-10-CM | POA: Diagnosis not present

## 2021-07-30 DIAGNOSIS — R2689 Other abnormalities of gait and mobility: Secondary | ICD-10-CM | POA: Diagnosis not present

## 2021-07-30 DIAGNOSIS — R296 Repeated falls: Secondary | ICD-10-CM | POA: Diagnosis not present

## 2021-08-07 DIAGNOSIS — G4733 Obstructive sleep apnea (adult) (pediatric): Secondary | ICD-10-CM | POA: Diagnosis not present

## 2021-08-07 DIAGNOSIS — J45909 Unspecified asthma, uncomplicated: Secondary | ICD-10-CM | POA: Diagnosis not present

## 2021-08-07 DIAGNOSIS — R0902 Hypoxemia: Secondary | ICD-10-CM | POA: Diagnosis not present

## 2021-08-07 DIAGNOSIS — J9610 Chronic respiratory failure, unspecified whether with hypoxia or hypercapnia: Secondary | ICD-10-CM | POA: Diagnosis not present

## 2021-08-24 DIAGNOSIS — G4733 Obstructive sleep apnea (adult) (pediatric): Secondary | ICD-10-CM | POA: Diagnosis not present

## 2021-08-24 DIAGNOSIS — J4520 Mild intermittent asthma, uncomplicated: Secondary | ICD-10-CM | POA: Diagnosis not present

## 2021-08-24 DIAGNOSIS — J9610 Chronic respiratory failure, unspecified whether with hypoxia or hypercapnia: Secondary | ICD-10-CM | POA: Diagnosis not present

## 2021-08-24 DIAGNOSIS — J45909 Unspecified asthma, uncomplicated: Secondary | ICD-10-CM | POA: Diagnosis not present

## 2021-08-26 ENCOUNTER — Encounter (INDEPENDENT_AMBULATORY_CARE_PROVIDER_SITE_OTHER): Payer: Self-pay

## 2021-08-26 ENCOUNTER — Ambulatory Visit (HOSPITAL_BASED_OUTPATIENT_CLINIC_OR_DEPARTMENT_OTHER): Payer: Medicare HMO | Attending: Pulmonary Disease | Admitting: Pulmonary Disease

## 2021-08-26 ENCOUNTER — Other Ambulatory Visit: Payer: Self-pay

## 2021-08-26 DIAGNOSIS — J9611 Chronic respiratory failure with hypoxia: Secondary | ICD-10-CM | POA: Diagnosis not present

## 2021-08-26 DIAGNOSIS — Z9981 Dependence on supplemental oxygen: Secondary | ICD-10-CM | POA: Diagnosis not present

## 2021-08-26 DIAGNOSIS — G4733 Obstructive sleep apnea (adult) (pediatric): Secondary | ICD-10-CM

## 2021-08-26 DIAGNOSIS — G4736 Sleep related hypoventilation in conditions classified elsewhere: Secondary | ICD-10-CM | POA: Diagnosis not present

## 2021-08-26 DIAGNOSIS — E662 Morbid (severe) obesity with alveolar hypoventilation: Secondary | ICD-10-CM

## 2021-09-01 ENCOUNTER — Telehealth: Payer: Self-pay | Admitting: Pulmonary Disease

## 2021-09-01 DIAGNOSIS — G4733 Obstructive sleep apnea (adult) (pediatric): Secondary | ICD-10-CM

## 2021-09-01 NOTE — Telephone Encounter (Signed)
Called and spoke to Shelley [name] [facility] per patient in background) and explained study and cpap titration to her. She voiced understanding and said patient will do CPAP titration. I explained to them we will place an order and someone will reach out to have patient scheduled for study. Order was placed  ? ?

## 2021-09-01 NOTE — Telephone Encounter (Signed)
PSG 08/26/21 >> AHI 34.4, SpO2 low 81%, used 2 liters, didn't meet split night criteria ? ? ?Please inform her that her sleep study shows severe obstructive sleep apnea.  Please let her know she needs to have a titration sleep study to determine the best settings for her CPAP therapy and how much oxygen she needs at night.   ? ?If she is agreeable, then please place order for CPAP titration study.  Please specify that the study should start on CPAP at 10 cm H2O and they can switch to Bipap if needed. ? ? ? ?

## 2021-09-01 NOTE — Procedures (Signed)
? ? ? ?  Patient Name: Shelley [name], Shelley [name] ?Study Date: 08/26/2021 ?Gender: Female ?D.O.B: 10/24/1965 ?Age (years): 45 ?Referring Provider: Chesley Mires MD, ABSM ?Height (inches): 64 ?Interpreting Physician: Chesley Mires MD, ABSM ?Weight (lbs): 227 ?RPSGT: Laren Everts ?BMI: 39 ?MRN: [id number] ?Neck Size: 15.50 ? ?CLINICAL INFORMATION ?Sleep Study Type: NPSG ? ?Indication for sleep study: History of sleep apnea and previously on CPAP with supplemental oxygen.  She presents to reassess status of obstructive sleep apnea and determine whether she should resume CPAP therapy.  She uses 2 liters supplemental oxygen. ? ?Epworth Sleepiness Score: 16 ? ?Most recent polysomnogram dated 10/08/2019 revealed an AHI of 36.2/h and RDI of 54.7/h. Most recent titration study dated 10/08/2019 was optimal at 21.17cm H2O with an AHI of 0.0/h. ? ?SLEEP STUDY TECHNIQUE ?As per the AASM Manual for the Scoring of Sleep and Associated Events v2.3 (April 2016) with a hypopnea requiring 4% desaturations. ? ?The channels recorded and monitored were frontal, central and occipital EEG, electrooculogram (EOG), submentalis EMG (chin), nasal and oral airflow, thoracic and abdominal wall motion, anterior tibialis EMG, snore microphone, electrocardiogram, and pulse oximetry. ? ?MEDICATIONS ?Medications self-administered by patient taken the night of the study : N/A ? ?SLEEP ARCHITECTURE ?The study was initiated at 10:07:26 PM and ended at 4:46:45 AM. ? ?Sleep onset time was 17.0 minutes and the sleep efficiency was 67.7%%. The total sleep time was 270.5 minutes. ? ?Stage REM latency was 252.5 minutes. ? ?The patient spent 18.1%% of the night in stage N1 sleep, 79.1%% in stage N2 sleep, 0.0%% in stage N3 and 2.8% in REM. ? ?Alpha intrusion was absent. ? ?Supine sleep was 60.47%. ? ?RESPIRATORY PARAMETERS ?The overall apnea/hypopnea index (AHI) was 34.4 per hour. There were 56 total apneas, including 56 obstructive, 0 central and 0 mixed apneas. There  were 99 hypopneas and 70 RERAs. ? ?The AHI during Stage REM sleep was 64.0 per hour. ? ?AHI while supine was 33.0 per hour. ?She did not meet split night study criteria due to insufficient sleep in first portion of the test. ? ?The mean oxygen saturation was 94.7%. The minimum SpO2 during sleep was 81.0%.  The study was conducted with her using 2 liters supplemental oxygen. ? ?Moderate snoring was noted during this study. ? ?CARDIAC DATA ?The 2 lead EKG demonstrated sinus rhythm. The mean heart rate was 84.2 beats per minute. Other EKG findings include: None. ? ?LEG MOVEMENT DATA ?The total PLMS were 0 with a resulting PLMS index of 0.0. Associated arousal with leg movement index was 0.0 . ? ?IMPRESSIONS ?- Severe obstructive sleep apnea with an AHI of 34.4 and SpO2 low of 81% ?- She wore 2 liters supplemental oxygen during this study. ? ?DIAGNOSIS ?- Obstructive Sleep Apnea (G47.33) ?- Nocturnal Hypoxemia (G47.36) ? ?RECOMMENDATIONS ?- She should resume CPAP therapy with supplemental oxygen. ?- Avoid alcohol, sedatives and other CNS depressants that may worsen sleep apnea and disrupt normal sleep architecture. ?- Sleep hygiene should be reviewed to assess factors that may improve sleep quality. ?- Weight management and regular exercise should be initiated or continued if appropriate. ? ?[Electronically signed] 09/01/2021 08:18 AM ? ?Chesley Mires MD, ABSM ?Diplomate, Tax adviser of Sleep Medicine ?NPI: [id number] ? ?[place] ?PH: [phone]   FX: [phone] ?ACCREDITED BY THE [employer] SLE[employer]IC[employer] ? ?

## 2021-09-07 DIAGNOSIS — R269 Unspecified abnormalities of gait and mobility: Secondary | ICD-10-CM | POA: Diagnosis not present

## 2021-09-07 DIAGNOSIS — R32 Unspecified urinary incontinence: Secondary | ICD-10-CM | POA: Diagnosis not present

## 2021-09-07 DIAGNOSIS — K625 Hemorrhage of anus and rectum: Secondary | ICD-10-CM | POA: Diagnosis not present

## 2021-09-07 DIAGNOSIS — R2689 Other abnormalities of gait and mobility: Secondary | ICD-10-CM | POA: Diagnosis not present

## 2021-09-07 DIAGNOSIS — Z6838 Body mass index (BMI) 38.0-38.9, adult: Secondary | ICD-10-CM | POA: Diagnosis not present

## 2021-09-07 DIAGNOSIS — R69 Illness, unspecified: Secondary | ICD-10-CM | POA: Diagnosis not present

## 2021-09-24 DIAGNOSIS — J45909 Unspecified asthma, uncomplicated: Secondary | ICD-10-CM | POA: Diagnosis not present

## 2021-09-24 DIAGNOSIS — J9610 Chronic respiratory failure, unspecified whether with hypoxia or hypercapnia: Secondary | ICD-10-CM | POA: Diagnosis not present

## 2021-09-24 DIAGNOSIS — J4520 Mild intermittent asthma, uncomplicated: Secondary | ICD-10-CM | POA: Diagnosis not present

## 2021-09-24 DIAGNOSIS — G4733 Obstructive sleep apnea (adult) (pediatric): Secondary | ICD-10-CM | POA: Diagnosis not present

## 2021-10-12 ENCOUNTER — Encounter (HOSPITAL_BASED_OUTPATIENT_CLINIC_OR_DEPARTMENT_OTHER): Payer: Medicare HMO | Admitting: Pulmonary Disease

## 2021-10-25 DIAGNOSIS — R41841 Cognitive communication deficit: Secondary | ICD-10-CM | POA: Diagnosis not present

## 2021-10-25 DIAGNOSIS — R488 Other symbolic dysfunctions: Secondary | ICD-10-CM | POA: Diagnosis not present

## 2021-10-29 DIAGNOSIS — R488 Other symbolic dysfunctions: Secondary | ICD-10-CM | POA: Diagnosis not present

## 2021-10-29 DIAGNOSIS — R278 Other lack of coordination: Secondary | ICD-10-CM | POA: Diagnosis not present

## 2021-10-29 DIAGNOSIS — R296 Repeated falls: Secondary | ICD-10-CM | POA: Diagnosis not present

## 2021-10-29 DIAGNOSIS — M6281 Muscle weakness (generalized): Secondary | ICD-10-CM | POA: Diagnosis not present

## 2021-10-29 DIAGNOSIS — R41841 Cognitive communication deficit: Secondary | ICD-10-CM | POA: Diagnosis not present

## 2021-11-02 DIAGNOSIS — R278 Other lack of coordination: Secondary | ICD-10-CM | POA: Diagnosis not present

## 2021-11-02 DIAGNOSIS — R41841 Cognitive communication deficit: Secondary | ICD-10-CM | POA: Diagnosis not present

## 2021-11-02 DIAGNOSIS — M6281 Muscle weakness (generalized): Secondary | ICD-10-CM | POA: Diagnosis not present

## 2021-11-02 DIAGNOSIS — R488 Other symbolic dysfunctions: Secondary | ICD-10-CM | POA: Diagnosis not present

## 2021-11-02 DIAGNOSIS — R296 Repeated falls: Secondary | ICD-10-CM | POA: Diagnosis not present

## 2021-11-04 DIAGNOSIS — R278 Other lack of coordination: Secondary | ICD-10-CM | POA: Diagnosis not present

## 2021-11-04 DIAGNOSIS — R41841 Cognitive communication deficit: Secondary | ICD-10-CM | POA: Diagnosis not present

## 2021-11-04 DIAGNOSIS — R488 Other symbolic dysfunctions: Secondary | ICD-10-CM | POA: Diagnosis not present

## 2021-11-04 DIAGNOSIS — R296 Repeated falls: Secondary | ICD-10-CM | POA: Diagnosis not present

## 2021-11-04 DIAGNOSIS — M6281 Muscle weakness (generalized): Secondary | ICD-10-CM | POA: Diagnosis not present

## 2021-11-06 DIAGNOSIS — G4733 Obstructive sleep apnea (adult) (pediatric): Secondary | ICD-10-CM | POA: Diagnosis not present

## 2021-11-06 DIAGNOSIS — R69 Illness, unspecified: Secondary | ICD-10-CM | POA: Diagnosis not present

## 2021-11-06 DIAGNOSIS — E1140 Type 2 diabetes mellitus with diabetic neuropathy, unspecified: Secondary | ICD-10-CM | POA: Diagnosis not present

## 2021-11-06 DIAGNOSIS — Z008 Encounter for other general examination: Secondary | ICD-10-CM | POA: Diagnosis not present

## 2021-11-06 DIAGNOSIS — E785 Hyperlipidemia, unspecified: Secondary | ICD-10-CM | POA: Diagnosis not present

## 2021-11-06 DIAGNOSIS — N393 Stress incontinence (female) (male): Secondary | ICD-10-CM | POA: Diagnosis not present

## 2021-11-06 DIAGNOSIS — Z6835 Body mass index (BMI) 35.0-35.9, adult: Secondary | ICD-10-CM | POA: Diagnosis not present

## 2021-11-06 DIAGNOSIS — J9611 Chronic respiratory failure with hypoxia: Secondary | ICD-10-CM | POA: Diagnosis not present

## 2021-11-06 DIAGNOSIS — I10 Essential (primary) hypertension: Secondary | ICD-10-CM | POA: Diagnosis not present

## 2021-11-06 DIAGNOSIS — K219 Gastro-esophageal reflux disease without esophagitis: Secondary | ICD-10-CM | POA: Diagnosis not present

## 2021-11-06 DIAGNOSIS — B372 Candidiasis of skin and nail: Secondary | ICD-10-CM | POA: Diagnosis not present

## 2021-11-09 DIAGNOSIS — R488 Other symbolic dysfunctions: Secondary | ICD-10-CM | POA: Diagnosis not present

## 2021-11-09 DIAGNOSIS — R41841 Cognitive communication deficit: Secondary | ICD-10-CM | POA: Diagnosis not present

## 2021-11-10 DIAGNOSIS — M6281 Muscle weakness (generalized): Secondary | ICD-10-CM | POA: Diagnosis not present

## 2021-11-10 DIAGNOSIS — R278 Other lack of coordination: Secondary | ICD-10-CM | POA: Diagnosis not present

## 2021-11-10 DIAGNOSIS — R296 Repeated falls: Secondary | ICD-10-CM | POA: Diagnosis not present

## 2021-11-11 DIAGNOSIS — R41841 Cognitive communication deficit: Secondary | ICD-10-CM | POA: Diagnosis not present

## 2021-11-11 DIAGNOSIS — R488 Other symbolic dysfunctions: Secondary | ICD-10-CM | POA: Diagnosis not present

## 2021-11-12 DIAGNOSIS — R296 Repeated falls: Secondary | ICD-10-CM | POA: Diagnosis not present

## 2021-11-12 DIAGNOSIS — R278 Other lack of coordination: Secondary | ICD-10-CM | POA: Diagnosis not present

## 2021-11-12 DIAGNOSIS — M6281 Muscle weakness (generalized): Secondary | ICD-10-CM | POA: Diagnosis not present

## 2021-11-14 ENCOUNTER — Encounter (HOSPITAL_BASED_OUTPATIENT_CLINIC_OR_DEPARTMENT_OTHER): Payer: Medicare HMO | Admitting: Pulmonary Disease

## 2021-11-16 DIAGNOSIS — R488 Other symbolic dysfunctions: Secondary | ICD-10-CM | POA: Diagnosis not present

## 2021-11-16 DIAGNOSIS — R41841 Cognitive communication deficit: Secondary | ICD-10-CM | POA: Diagnosis not present

## 2021-11-16 DIAGNOSIS — M6281 Muscle weakness (generalized): Secondary | ICD-10-CM | POA: Diagnosis not present

## 2021-11-16 DIAGNOSIS — R278 Other lack of coordination: Secondary | ICD-10-CM | POA: Diagnosis not present

## 2021-11-16 DIAGNOSIS — R296 Repeated falls: Secondary | ICD-10-CM | POA: Diagnosis not present

## 2021-11-17 DIAGNOSIS — R488 Other symbolic dysfunctions: Secondary | ICD-10-CM | POA: Diagnosis not present

## 2021-11-17 DIAGNOSIS — R41841 Cognitive communication deficit: Secondary | ICD-10-CM | POA: Diagnosis not present

## 2021-11-18 DIAGNOSIS — R296 Repeated falls: Secondary | ICD-10-CM | POA: Diagnosis not present

## 2021-11-18 DIAGNOSIS — M6281 Muscle weakness (generalized): Secondary | ICD-10-CM | POA: Diagnosis not present

## 2021-11-18 DIAGNOSIS — R278 Other lack of coordination: Secondary | ICD-10-CM | POA: Diagnosis not present

## 2021-11-19 DIAGNOSIS — R296 Repeated falls: Secondary | ICD-10-CM | POA: Diagnosis not present

## 2021-11-19 DIAGNOSIS — M6281 Muscle weakness (generalized): Secondary | ICD-10-CM | POA: Diagnosis not present

## 2021-11-22 DIAGNOSIS — M6281 Muscle weakness (generalized): Secondary | ICD-10-CM | POA: Diagnosis not present

## 2021-11-22 DIAGNOSIS — R296 Repeated falls: Secondary | ICD-10-CM | POA: Diagnosis not present

## 2021-11-23 DIAGNOSIS — R488 Other symbolic dysfunctions: Secondary | ICD-10-CM | POA: Diagnosis not present

## 2021-11-23 DIAGNOSIS — R278 Other lack of coordination: Secondary | ICD-10-CM | POA: Diagnosis not present

## 2021-11-23 DIAGNOSIS — R296 Repeated falls: Secondary | ICD-10-CM | POA: Diagnosis not present

## 2021-11-23 DIAGNOSIS — R41841 Cognitive communication deficit: Secondary | ICD-10-CM | POA: Diagnosis not present

## 2021-11-23 DIAGNOSIS — M6281 Muscle weakness (generalized): Secondary | ICD-10-CM | POA: Diagnosis not present

## 2021-11-24 DIAGNOSIS — R488 Other symbolic dysfunctions: Secondary | ICD-10-CM | POA: Diagnosis not present

## 2021-11-24 DIAGNOSIS — M6281 Muscle weakness (generalized): Secondary | ICD-10-CM | POA: Diagnosis not present

## 2021-11-24 DIAGNOSIS — R41841 Cognitive communication deficit: Secondary | ICD-10-CM | POA: Diagnosis not present

## 2021-11-24 DIAGNOSIS — R296 Repeated falls: Secondary | ICD-10-CM | POA: Diagnosis not present

## 2021-11-26 DIAGNOSIS — R296 Repeated falls: Secondary | ICD-10-CM | POA: Diagnosis not present

## 2021-11-26 DIAGNOSIS — M6281 Muscle weakness (generalized): Secondary | ICD-10-CM | POA: Diagnosis not present

## 2021-11-26 DIAGNOSIS — R278 Other lack of coordination: Secondary | ICD-10-CM | POA: Diagnosis not present

## 2021-11-29 DIAGNOSIS — R296 Repeated falls: Secondary | ICD-10-CM | POA: Diagnosis not present

## 2021-11-29 DIAGNOSIS — M6281 Muscle weakness (generalized): Secondary | ICD-10-CM | POA: Diagnosis not present

## 2021-11-30 DIAGNOSIS — R278 Other lack of coordination: Secondary | ICD-10-CM | POA: Diagnosis not present

## 2021-11-30 DIAGNOSIS — R296 Repeated falls: Secondary | ICD-10-CM | POA: Diagnosis not present

## 2021-11-30 DIAGNOSIS — M6281 Muscle weakness (generalized): Secondary | ICD-10-CM | POA: Diagnosis not present

## 2021-12-01 DIAGNOSIS — M6281 Muscle weakness (generalized): Secondary | ICD-10-CM | POA: Diagnosis not present

## 2021-12-01 DIAGNOSIS — R41841 Cognitive communication deficit: Secondary | ICD-10-CM | POA: Diagnosis not present

## 2021-12-01 DIAGNOSIS — R488 Other symbolic dysfunctions: Secondary | ICD-10-CM | POA: Diagnosis not present

## 2021-12-01 DIAGNOSIS — R296 Repeated falls: Secondary | ICD-10-CM | POA: Diagnosis not present

## 2021-12-02 DIAGNOSIS — M6281 Muscle weakness (generalized): Secondary | ICD-10-CM | POA: Diagnosis not present

## 2021-12-02 DIAGNOSIS — R296 Repeated falls: Secondary | ICD-10-CM | POA: Diagnosis not present

## 2021-12-02 DIAGNOSIS — R278 Other lack of coordination: Secondary | ICD-10-CM | POA: Diagnosis not present

## 2021-12-03 DIAGNOSIS — R41841 Cognitive communication deficit: Secondary | ICD-10-CM | POA: Diagnosis not present

## 2021-12-03 DIAGNOSIS — R488 Other symbolic dysfunctions: Secondary | ICD-10-CM | POA: Diagnosis not present

## 2021-12-05 DIAGNOSIS — J45909 Unspecified asthma, uncomplicated: Secondary | ICD-10-CM | POA: Diagnosis not present

## 2021-12-05 DIAGNOSIS — G4733 Obstructive sleep apnea (adult) (pediatric): Secondary | ICD-10-CM | POA: Diagnosis not present

## 2021-12-05 DIAGNOSIS — J9610 Chronic respiratory failure, unspecified whether with hypoxia or hypercapnia: Secondary | ICD-10-CM | POA: Diagnosis not present

## 2021-12-05 DIAGNOSIS — R0902 Hypoxemia: Secondary | ICD-10-CM | POA: Diagnosis not present

## 2021-12-06 DIAGNOSIS — R488 Other symbolic dysfunctions: Secondary | ICD-10-CM | POA: Diagnosis not present

## 2021-12-06 DIAGNOSIS — R41841 Cognitive communication deficit: Secondary | ICD-10-CM | POA: Diagnosis not present

## 2021-12-08 DIAGNOSIS — R296 Repeated falls: Secondary | ICD-10-CM | POA: Diagnosis not present

## 2021-12-08 DIAGNOSIS — R278 Other lack of coordination: Secondary | ICD-10-CM | POA: Diagnosis not present

## 2021-12-08 DIAGNOSIS — M6281 Muscle weakness (generalized): Secondary | ICD-10-CM | POA: Diagnosis not present

## 2021-12-09 DIAGNOSIS — R278 Other lack of coordination: Secondary | ICD-10-CM | POA: Diagnosis not present

## 2021-12-09 DIAGNOSIS — M6281 Muscle weakness (generalized): Secondary | ICD-10-CM | POA: Diagnosis not present

## 2021-12-09 DIAGNOSIS — R296 Repeated falls: Secondary | ICD-10-CM | POA: Diagnosis not present

## 2021-12-10 DIAGNOSIS — R488 Other symbolic dysfunctions: Secondary | ICD-10-CM | POA: Diagnosis not present

## 2021-12-10 DIAGNOSIS — R296 Repeated falls: Secondary | ICD-10-CM | POA: Diagnosis not present

## 2021-12-10 DIAGNOSIS — M6281 Muscle weakness (generalized): Secondary | ICD-10-CM | POA: Diagnosis not present

## 2021-12-10 DIAGNOSIS — R41841 Cognitive communication deficit: Secondary | ICD-10-CM | POA: Diagnosis not present

## 2021-12-13 DIAGNOSIS — G4733 Obstructive sleep apnea (adult) (pediatric): Secondary | ICD-10-CM | POA: Diagnosis not present

## 2021-12-13 DIAGNOSIS — M6281 Muscle weakness (generalized): Secondary | ICD-10-CM | POA: Diagnosis not present

## 2021-12-13 DIAGNOSIS — R69 Illness, unspecified: Secondary | ICD-10-CM | POA: Diagnosis not present

## 2021-12-13 DIAGNOSIS — J9611 Chronic respiratory failure with hypoxia: Secondary | ICD-10-CM | POA: Diagnosis not present

## 2021-12-13 DIAGNOSIS — R296 Repeated falls: Secondary | ICD-10-CM | POA: Diagnosis not present

## 2021-12-13 DIAGNOSIS — E782 Mixed hyperlipidemia: Secondary | ICD-10-CM | POA: Diagnosis not present

## 2021-12-13 DIAGNOSIS — H35033 Hypertensive retinopathy, bilateral: Secondary | ICD-10-CM | POA: Diagnosis not present

## 2021-12-13 DIAGNOSIS — E1121 Type 2 diabetes mellitus with diabetic nephropathy: Secondary | ICD-10-CM | POA: Diagnosis not present

## 2021-12-13 DIAGNOSIS — I129 Hypertensive chronic kidney disease with stage 1 through stage 4 chronic kidney disease, or unspecified chronic kidney disease: Secondary | ICD-10-CM | POA: Diagnosis not present

## 2021-12-13 DIAGNOSIS — N181 Chronic kidney disease, stage 1: Secondary | ICD-10-CM | POA: Diagnosis not present

## 2021-12-15 DIAGNOSIS — R488 Other symbolic dysfunctions: Secondary | ICD-10-CM | POA: Diagnosis not present

## 2021-12-15 DIAGNOSIS — M6281 Muscle weakness (generalized): Secondary | ICD-10-CM | POA: Diagnosis not present

## 2021-12-15 DIAGNOSIS — R278 Other lack of coordination: Secondary | ICD-10-CM | POA: Diagnosis not present

## 2021-12-15 DIAGNOSIS — R296 Repeated falls: Secondary | ICD-10-CM | POA: Diagnosis not present

## 2021-12-15 DIAGNOSIS — R41841 Cognitive communication deficit: Secondary | ICD-10-CM | POA: Diagnosis not present

## 2021-12-16 DIAGNOSIS — R296 Repeated falls: Secondary | ICD-10-CM | POA: Diagnosis not present

## 2021-12-16 DIAGNOSIS — R278 Other lack of coordination: Secondary | ICD-10-CM | POA: Diagnosis not present

## 2021-12-16 DIAGNOSIS — M6281 Muscle weakness (generalized): Secondary | ICD-10-CM | POA: Diagnosis not present

## 2021-12-17 DIAGNOSIS — R278 Other lack of coordination: Secondary | ICD-10-CM | POA: Diagnosis not present

## 2021-12-17 DIAGNOSIS — M6281 Muscle weakness (generalized): Secondary | ICD-10-CM | POA: Diagnosis not present

## 2021-12-17 DIAGNOSIS — R41841 Cognitive communication deficit: Secondary | ICD-10-CM | POA: Diagnosis not present

## 2021-12-17 DIAGNOSIS — R488 Other symbolic dysfunctions: Secondary | ICD-10-CM | POA: Diagnosis not present

## 2021-12-17 DIAGNOSIS — R296 Repeated falls: Secondary | ICD-10-CM | POA: Diagnosis not present

## 2021-12-20 DIAGNOSIS — R41841 Cognitive communication deficit: Secondary | ICD-10-CM | POA: Diagnosis not present

## 2021-12-20 DIAGNOSIS — R488 Other symbolic dysfunctions: Secondary | ICD-10-CM | POA: Diagnosis not present

## 2021-12-21 DIAGNOSIS — R278 Other lack of coordination: Secondary | ICD-10-CM | POA: Diagnosis not present

## 2021-12-21 DIAGNOSIS — M6281 Muscle weakness (generalized): Secondary | ICD-10-CM | POA: Diagnosis not present

## 2021-12-21 DIAGNOSIS — R296 Repeated falls: Secondary | ICD-10-CM | POA: Diagnosis not present

## 2021-12-22 DIAGNOSIS — M6281 Muscle weakness (generalized): Secondary | ICD-10-CM | POA: Diagnosis not present

## 2021-12-22 DIAGNOSIS — R278 Other lack of coordination: Secondary | ICD-10-CM | POA: Diagnosis not present

## 2021-12-22 DIAGNOSIS — R296 Repeated falls: Secondary | ICD-10-CM | POA: Diagnosis not present

## 2021-12-23 DIAGNOSIS — R296 Repeated falls: Secondary | ICD-10-CM | POA: Diagnosis not present

## 2021-12-23 DIAGNOSIS — M6281 Muscle weakness (generalized): Secondary | ICD-10-CM | POA: Diagnosis not present

## 2021-12-23 DIAGNOSIS — R278 Other lack of coordination: Secondary | ICD-10-CM | POA: Diagnosis not present

## 2021-12-24 DIAGNOSIS — R488 Other symbolic dysfunctions: Secondary | ICD-10-CM | POA: Diagnosis not present

## 2021-12-24 DIAGNOSIS — R41841 Cognitive communication deficit: Secondary | ICD-10-CM | POA: Diagnosis not present

## 2021-12-27 DIAGNOSIS — R488 Other symbolic dysfunctions: Secondary | ICD-10-CM | POA: Diagnosis not present

## 2021-12-27 DIAGNOSIS — R41841 Cognitive communication deficit: Secondary | ICD-10-CM | POA: Diagnosis not present

## 2021-12-29 DIAGNOSIS — R296 Repeated falls: Secondary | ICD-10-CM | POA: Diagnosis not present

## 2021-12-29 DIAGNOSIS — R278 Other lack of coordination: Secondary | ICD-10-CM | POA: Diagnosis not present

## 2021-12-29 DIAGNOSIS — M6281 Muscle weakness (generalized): Secondary | ICD-10-CM | POA: Diagnosis not present

## 2021-12-30 DIAGNOSIS — M6281 Muscle weakness (generalized): Secondary | ICD-10-CM | POA: Diagnosis not present

## 2021-12-30 DIAGNOSIS — R296 Repeated falls: Secondary | ICD-10-CM | POA: Diagnosis not present

## 2021-12-31 DIAGNOSIS — M6281 Muscle weakness (generalized): Secondary | ICD-10-CM | POA: Diagnosis not present

## 2021-12-31 DIAGNOSIS — R296 Repeated falls: Secondary | ICD-10-CM | POA: Diagnosis not present

## 2021-12-31 DIAGNOSIS — R41841 Cognitive communication deficit: Secondary | ICD-10-CM | POA: Diagnosis not present

## 2021-12-31 DIAGNOSIS — R278 Other lack of coordination: Secondary | ICD-10-CM | POA: Diagnosis not present

## 2021-12-31 DIAGNOSIS — R488 Other symbolic dysfunctions: Secondary | ICD-10-CM | POA: Diagnosis not present

## 2022-01-03 DIAGNOSIS — R296 Repeated falls: Secondary | ICD-10-CM | POA: Diagnosis not present

## 2022-01-03 DIAGNOSIS — M6281 Muscle weakness (generalized): Secondary | ICD-10-CM | POA: Diagnosis not present

## 2022-01-04 DIAGNOSIS — M6281 Muscle weakness (generalized): Secondary | ICD-10-CM | POA: Diagnosis not present

## 2022-01-04 DIAGNOSIS — R278 Other lack of coordination: Secondary | ICD-10-CM | POA: Diagnosis not present

## 2022-01-04 DIAGNOSIS — G4733 Obstructive sleep apnea (adult) (pediatric): Secondary | ICD-10-CM | POA: Diagnosis not present

## 2022-01-04 DIAGNOSIS — R296 Repeated falls: Secondary | ICD-10-CM | POA: Diagnosis not present

## 2022-01-04 DIAGNOSIS — J9610 Chronic respiratory failure, unspecified whether with hypoxia or hypercapnia: Secondary | ICD-10-CM | POA: Diagnosis not present

## 2022-01-04 DIAGNOSIS — J45909 Unspecified asthma, uncomplicated: Secondary | ICD-10-CM | POA: Diagnosis not present

## 2022-01-04 DIAGNOSIS — R0902 Hypoxemia: Secondary | ICD-10-CM | POA: Diagnosis not present

## 2022-01-05 DIAGNOSIS — R41841 Cognitive communication deficit: Secondary | ICD-10-CM | POA: Diagnosis not present

## 2022-01-05 DIAGNOSIS — R488 Other symbolic dysfunctions: Secondary | ICD-10-CM | POA: Diagnosis not present

## 2022-01-06 DIAGNOSIS — R278 Other lack of coordination: Secondary | ICD-10-CM | POA: Diagnosis not present

## 2022-01-06 DIAGNOSIS — M6281 Muscle weakness (generalized): Secondary | ICD-10-CM | POA: Diagnosis not present

## 2022-01-06 DIAGNOSIS — R296 Repeated falls: Secondary | ICD-10-CM | POA: Diagnosis not present

## 2022-01-07 DIAGNOSIS — M6281 Muscle weakness (generalized): Secondary | ICD-10-CM | POA: Diagnosis not present

## 2022-01-07 DIAGNOSIS — R296 Repeated falls: Secondary | ICD-10-CM | POA: Diagnosis not present

## 2022-01-07 DIAGNOSIS — R488 Other symbolic dysfunctions: Secondary | ICD-10-CM | POA: Diagnosis not present

## 2022-01-07 DIAGNOSIS — R41841 Cognitive communication deficit: Secondary | ICD-10-CM | POA: Diagnosis not present

## 2022-01-10 DIAGNOSIS — M6281 Muscle weakness (generalized): Secondary | ICD-10-CM | POA: Diagnosis not present

## 2022-01-10 DIAGNOSIS — R296 Repeated falls: Secondary | ICD-10-CM | POA: Diagnosis not present

## 2022-01-10 DIAGNOSIS — R278 Other lack of coordination: Secondary | ICD-10-CM | POA: Diagnosis not present

## 2022-01-11 DIAGNOSIS — R488 Other symbolic dysfunctions: Secondary | ICD-10-CM | POA: Diagnosis not present

## 2022-01-11 DIAGNOSIS — R41841 Cognitive communication deficit: Secondary | ICD-10-CM | POA: Diagnosis not present

## 2022-01-12 DIAGNOSIS — M6281 Muscle weakness (generalized): Secondary | ICD-10-CM | POA: Diagnosis not present

## 2022-01-12 DIAGNOSIS — R488 Other symbolic dysfunctions: Secondary | ICD-10-CM | POA: Diagnosis not present

## 2022-01-12 DIAGNOSIS — R41841 Cognitive communication deficit: Secondary | ICD-10-CM | POA: Diagnosis not present

## 2022-01-12 DIAGNOSIS — R296 Repeated falls: Secondary | ICD-10-CM | POA: Diagnosis not present

## 2022-01-12 DIAGNOSIS — R278 Other lack of coordination: Secondary | ICD-10-CM | POA: Diagnosis not present

## 2022-01-13 DIAGNOSIS — R278 Other lack of coordination: Secondary | ICD-10-CM | POA: Diagnosis not present

## 2022-01-13 DIAGNOSIS — R296 Repeated falls: Secondary | ICD-10-CM | POA: Diagnosis not present

## 2022-01-13 DIAGNOSIS — M6281 Muscle weakness (generalized): Secondary | ICD-10-CM | POA: Diagnosis not present

## 2022-01-14 DIAGNOSIS — R296 Repeated falls: Secondary | ICD-10-CM | POA: Diagnosis not present

## 2022-01-14 DIAGNOSIS — M6281 Muscle weakness (generalized): Secondary | ICD-10-CM | POA: Diagnosis not present

## 2022-01-17 DIAGNOSIS — R278 Other lack of coordination: Secondary | ICD-10-CM | POA: Diagnosis not present

## 2022-01-17 DIAGNOSIS — M6281 Muscle weakness (generalized): Secondary | ICD-10-CM | POA: Diagnosis not present

## 2022-01-17 DIAGNOSIS — R296 Repeated falls: Secondary | ICD-10-CM | POA: Diagnosis not present

## 2022-01-18 DIAGNOSIS — R41841 Cognitive communication deficit: Secondary | ICD-10-CM | POA: Diagnosis not present

## 2022-01-18 DIAGNOSIS — R488 Other symbolic dysfunctions: Secondary | ICD-10-CM | POA: Diagnosis not present

## 2022-01-19 DIAGNOSIS — M6281 Muscle weakness (generalized): Secondary | ICD-10-CM | POA: Diagnosis not present

## 2022-01-19 DIAGNOSIS — R296 Repeated falls: Secondary | ICD-10-CM | POA: Diagnosis not present

## 2022-01-19 DIAGNOSIS — R278 Other lack of coordination: Secondary | ICD-10-CM | POA: Diagnosis not present

## 2022-01-20 DIAGNOSIS — R41841 Cognitive communication deficit: Secondary | ICD-10-CM | POA: Diagnosis not present

## 2022-01-20 DIAGNOSIS — R296 Repeated falls: Secondary | ICD-10-CM | POA: Diagnosis not present

## 2022-01-20 DIAGNOSIS — R278 Other lack of coordination: Secondary | ICD-10-CM | POA: Diagnosis not present

## 2022-01-20 DIAGNOSIS — R488 Other symbolic dysfunctions: Secondary | ICD-10-CM | POA: Diagnosis not present

## 2022-01-20 DIAGNOSIS — M6281 Muscle weakness (generalized): Secondary | ICD-10-CM | POA: Diagnosis not present

## 2022-01-21 DIAGNOSIS — R296 Repeated falls: Secondary | ICD-10-CM | POA: Diagnosis not present

## 2022-01-21 DIAGNOSIS — M6281 Muscle weakness (generalized): Secondary | ICD-10-CM | POA: Diagnosis not present

## 2022-01-21 DIAGNOSIS — R278 Other lack of coordination: Secondary | ICD-10-CM | POA: Diagnosis not present

## 2022-01-24 DIAGNOSIS — R488 Other symbolic dysfunctions: Secondary | ICD-10-CM | POA: Diagnosis not present

## 2022-01-24 DIAGNOSIS — R41841 Cognitive communication deficit: Secondary | ICD-10-CM | POA: Diagnosis not present

## 2022-01-25 DIAGNOSIS — R278 Other lack of coordination: Secondary | ICD-10-CM | POA: Diagnosis not present

## 2022-01-25 DIAGNOSIS — R296 Repeated falls: Secondary | ICD-10-CM | POA: Diagnosis not present

## 2022-01-25 DIAGNOSIS — M6281 Muscle weakness (generalized): Secondary | ICD-10-CM | POA: Diagnosis not present

## 2022-01-26 DIAGNOSIS — R488 Other symbolic dysfunctions: Secondary | ICD-10-CM | POA: Diagnosis not present

## 2022-01-26 DIAGNOSIS — R296 Repeated falls: Secondary | ICD-10-CM | POA: Diagnosis not present

## 2022-01-26 DIAGNOSIS — M6281 Muscle weakness (generalized): Secondary | ICD-10-CM | POA: Diagnosis not present

## 2022-01-26 DIAGNOSIS — R41841 Cognitive communication deficit: Secondary | ICD-10-CM | POA: Diagnosis not present

## 2022-01-26 DIAGNOSIS — R278 Other lack of coordination: Secondary | ICD-10-CM | POA: Diagnosis not present

## 2022-01-27 DIAGNOSIS — M6281 Muscle weakness (generalized): Secondary | ICD-10-CM | POA: Diagnosis not present

## 2022-01-27 DIAGNOSIS — R296 Repeated falls: Secondary | ICD-10-CM | POA: Diagnosis not present

## 2022-01-28 DIAGNOSIS — M6281 Muscle weakness (generalized): Secondary | ICD-10-CM | POA: Diagnosis not present

## 2022-01-28 DIAGNOSIS — R278 Other lack of coordination: Secondary | ICD-10-CM | POA: Diagnosis not present

## 2022-01-28 DIAGNOSIS — R296 Repeated falls: Secondary | ICD-10-CM | POA: Diagnosis not present

## 2022-01-31 DIAGNOSIS — R488 Other symbolic dysfunctions: Secondary | ICD-10-CM | POA: Diagnosis not present

## 2022-01-31 DIAGNOSIS — R41841 Cognitive communication deficit: Secondary | ICD-10-CM | POA: Diagnosis not present

## 2022-02-01 DIAGNOSIS — R278 Other lack of coordination: Secondary | ICD-10-CM | POA: Diagnosis not present

## 2022-02-01 DIAGNOSIS — R296 Repeated falls: Secondary | ICD-10-CM | POA: Diagnosis not present

## 2022-02-01 DIAGNOSIS — M6281 Muscle weakness (generalized): Secondary | ICD-10-CM | POA: Diagnosis not present

## 2022-02-01 DIAGNOSIS — R41841 Cognitive communication deficit: Secondary | ICD-10-CM | POA: Diagnosis not present

## 2022-02-01 DIAGNOSIS — R488 Other symbolic dysfunctions: Secondary | ICD-10-CM | POA: Diagnosis not present

## 2022-02-02 DIAGNOSIS — M6281 Muscle weakness (generalized): Secondary | ICD-10-CM | POA: Diagnosis not present

## 2022-02-02 DIAGNOSIS — R296 Repeated falls: Secondary | ICD-10-CM | POA: Diagnosis not present

## 2022-02-02 DIAGNOSIS — R278 Other lack of coordination: Secondary | ICD-10-CM | POA: Diagnosis not present

## 2022-02-03 DIAGNOSIS — R41841 Cognitive communication deficit: Secondary | ICD-10-CM | POA: Diagnosis not present

## 2022-02-03 DIAGNOSIS — R488 Other symbolic dysfunctions: Secondary | ICD-10-CM | POA: Diagnosis not present

## 2022-02-04 DIAGNOSIS — R296 Repeated falls: Secondary | ICD-10-CM | POA: Diagnosis not present

## 2022-02-04 DIAGNOSIS — J45909 Unspecified asthma, uncomplicated: Secondary | ICD-10-CM | POA: Diagnosis not present

## 2022-02-04 DIAGNOSIS — R278 Other lack of coordination: Secondary | ICD-10-CM | POA: Diagnosis not present

## 2022-02-04 DIAGNOSIS — R0902 Hypoxemia: Secondary | ICD-10-CM | POA: Diagnosis not present

## 2022-02-04 DIAGNOSIS — M6281 Muscle weakness (generalized): Secondary | ICD-10-CM | POA: Diagnosis not present

## 2022-02-04 DIAGNOSIS — G4733 Obstructive sleep apnea (adult) (pediatric): Secondary | ICD-10-CM | POA: Diagnosis not present

## 2022-02-04 DIAGNOSIS — J9610 Chronic respiratory failure, unspecified whether with hypoxia or hypercapnia: Secondary | ICD-10-CM | POA: Diagnosis not present

## 2022-02-07 DIAGNOSIS — R488 Other symbolic dysfunctions: Secondary | ICD-10-CM | POA: Diagnosis not present

## 2022-02-07 DIAGNOSIS — R41841 Cognitive communication deficit: Secondary | ICD-10-CM | POA: Diagnosis not present

## 2022-02-07 DIAGNOSIS — M6281 Muscle weakness (generalized): Secondary | ICD-10-CM | POA: Diagnosis not present

## 2022-02-07 DIAGNOSIS — R296 Repeated falls: Secondary | ICD-10-CM | POA: Diagnosis not present

## 2022-02-08 DIAGNOSIS — M6281 Muscle weakness (generalized): Secondary | ICD-10-CM | POA: Diagnosis not present

## 2022-02-08 DIAGNOSIS — J45909 Unspecified asthma, uncomplicated: Secondary | ICD-10-CM | POA: Diagnosis not present

## 2022-02-08 DIAGNOSIS — J4520 Mild intermittent asthma, uncomplicated: Secondary | ICD-10-CM | POA: Diagnosis not present

## 2022-02-08 DIAGNOSIS — R278 Other lack of coordination: Secondary | ICD-10-CM | POA: Diagnosis not present

## 2022-02-08 DIAGNOSIS — G4733 Obstructive sleep apnea (adult) (pediatric): Secondary | ICD-10-CM | POA: Diagnosis not present

## 2022-02-08 DIAGNOSIS — R296 Repeated falls: Secondary | ICD-10-CM | POA: Diagnosis not present

## 2022-02-08 DIAGNOSIS — J9610 Chronic respiratory failure, unspecified whether with hypoxia or hypercapnia: Secondary | ICD-10-CM | POA: Diagnosis not present

## 2022-02-09 DIAGNOSIS — M6281 Muscle weakness (generalized): Secondary | ICD-10-CM | POA: Diagnosis not present

## 2022-02-09 DIAGNOSIS — R296 Repeated falls: Secondary | ICD-10-CM | POA: Diagnosis not present

## 2022-02-09 DIAGNOSIS — R41841 Cognitive communication deficit: Secondary | ICD-10-CM | POA: Diagnosis not present

## 2022-02-09 DIAGNOSIS — R488 Other symbolic dysfunctions: Secondary | ICD-10-CM | POA: Diagnosis not present

## 2022-02-10 DIAGNOSIS — R41841 Cognitive communication deficit: Secondary | ICD-10-CM | POA: Diagnosis not present

## 2022-02-10 DIAGNOSIS — R278 Other lack of coordination: Secondary | ICD-10-CM | POA: Diagnosis not present

## 2022-02-10 DIAGNOSIS — M6281 Muscle weakness (generalized): Secondary | ICD-10-CM | POA: Diagnosis not present

## 2022-02-10 DIAGNOSIS — R296 Repeated falls: Secondary | ICD-10-CM | POA: Diagnosis not present

## 2022-02-10 DIAGNOSIS — R488 Other symbolic dysfunctions: Secondary | ICD-10-CM | POA: Diagnosis not present

## 2022-02-11 DIAGNOSIS — R296 Repeated falls: Secondary | ICD-10-CM | POA: Diagnosis not present

## 2022-02-11 DIAGNOSIS — R278 Other lack of coordination: Secondary | ICD-10-CM | POA: Diagnosis not present

## 2022-02-11 DIAGNOSIS — M6281 Muscle weakness (generalized): Secondary | ICD-10-CM | POA: Diagnosis not present

## 2022-02-15 DIAGNOSIS — R296 Repeated falls: Secondary | ICD-10-CM | POA: Diagnosis not present

## 2022-02-15 DIAGNOSIS — R2689 Other abnormalities of gait and mobility: Secondary | ICD-10-CM | POA: Diagnosis not present

## 2022-02-15 DIAGNOSIS — M6281 Muscle weakness (generalized): Secondary | ICD-10-CM | POA: Diagnosis not present

## 2022-02-15 DIAGNOSIS — R278 Other lack of coordination: Secondary | ICD-10-CM | POA: Diagnosis not present

## 2022-02-16 DIAGNOSIS — R488 Other symbolic dysfunctions: Secondary | ICD-10-CM | POA: Diagnosis not present

## 2022-02-16 DIAGNOSIS — M6281 Muscle weakness (generalized): Secondary | ICD-10-CM | POA: Diagnosis not present

## 2022-02-16 DIAGNOSIS — R296 Repeated falls: Secondary | ICD-10-CM | POA: Diagnosis not present

## 2022-02-16 DIAGNOSIS — R41841 Cognitive communication deficit: Secondary | ICD-10-CM | POA: Diagnosis not present

## 2022-02-16 DIAGNOSIS — R278 Other lack of coordination: Secondary | ICD-10-CM | POA: Diagnosis not present

## 2022-02-18 DIAGNOSIS — R296 Repeated falls: Secondary | ICD-10-CM | POA: Diagnosis not present

## 2022-02-18 DIAGNOSIS — R2689 Other abnormalities of gait and mobility: Secondary | ICD-10-CM | POA: Diagnosis not present

## 2022-02-18 DIAGNOSIS — M6281 Muscle weakness (generalized): Secondary | ICD-10-CM | POA: Diagnosis not present

## 2022-02-21 DIAGNOSIS — R278 Other lack of coordination: Secondary | ICD-10-CM | POA: Diagnosis not present

## 2022-02-21 DIAGNOSIS — R296 Repeated falls: Secondary | ICD-10-CM | POA: Diagnosis not present

## 2022-02-21 DIAGNOSIS — M6281 Muscle weakness (generalized): Secondary | ICD-10-CM | POA: Diagnosis not present

## 2022-02-22 DIAGNOSIS — R41841 Cognitive communication deficit: Secondary | ICD-10-CM | POA: Diagnosis not present

## 2022-02-22 DIAGNOSIS — R488 Other symbolic dysfunctions: Secondary | ICD-10-CM | POA: Diagnosis not present

## 2022-02-24 DIAGNOSIS — R278 Other lack of coordination: Secondary | ICD-10-CM | POA: Diagnosis not present

## 2022-02-24 DIAGNOSIS — R488 Other symbolic dysfunctions: Secondary | ICD-10-CM | POA: Diagnosis not present

## 2022-02-24 DIAGNOSIS — R296 Repeated falls: Secondary | ICD-10-CM | POA: Diagnosis not present

## 2022-02-24 DIAGNOSIS — M6281 Muscle weakness (generalized): Secondary | ICD-10-CM | POA: Diagnosis not present

## 2022-02-24 DIAGNOSIS — R41841 Cognitive communication deficit: Secondary | ICD-10-CM | POA: Diagnosis not present

## 2022-02-25 DIAGNOSIS — R488 Other symbolic dysfunctions: Secondary | ICD-10-CM | POA: Diagnosis not present

## 2022-02-25 DIAGNOSIS — R41841 Cognitive communication deficit: Secondary | ICD-10-CM | POA: Diagnosis not present

## 2022-02-28 DIAGNOSIS — R41841 Cognitive communication deficit: Secondary | ICD-10-CM | POA: Diagnosis not present

## 2022-02-28 DIAGNOSIS — R488 Other symbolic dysfunctions: Secondary | ICD-10-CM | POA: Diagnosis not present

## 2022-03-01 DIAGNOSIS — R69 Illness, unspecified: Secondary | ICD-10-CM | POA: Diagnosis not present

## 2022-03-01 DIAGNOSIS — R2689 Other abnormalities of gait and mobility: Secondary | ICD-10-CM | POA: Diagnosis not present

## 2022-03-01 DIAGNOSIS — Z23 Encounter for immunization: Secondary | ICD-10-CM | POA: Diagnosis not present

## 2022-03-01 DIAGNOSIS — R296 Repeated falls: Secondary | ICD-10-CM | POA: Diagnosis not present

## 2022-03-01 DIAGNOSIS — R278 Other lack of coordination: Secondary | ICD-10-CM | POA: Diagnosis not present

## 2022-03-01 DIAGNOSIS — M6281 Muscle weakness (generalized): Secondary | ICD-10-CM | POA: Diagnosis not present

## 2022-03-01 DIAGNOSIS — F411 Generalized anxiety disorder: Secondary | ICD-10-CM | POA: Diagnosis not present

## 2022-03-02 DIAGNOSIS — R41841 Cognitive communication deficit: Secondary | ICD-10-CM | POA: Diagnosis not present

## 2022-03-02 DIAGNOSIS — R488 Other symbolic dysfunctions: Secondary | ICD-10-CM | POA: Diagnosis not present

## 2022-03-03 DIAGNOSIS — R41841 Cognitive communication deficit: Secondary | ICD-10-CM | POA: Diagnosis not present

## 2022-03-03 DIAGNOSIS — R488 Other symbolic dysfunctions: Secondary | ICD-10-CM | POA: Diagnosis not present

## 2022-03-04 DIAGNOSIS — R296 Repeated falls: Secondary | ICD-10-CM | POA: Diagnosis not present

## 2022-03-04 DIAGNOSIS — M6281 Muscle weakness (generalized): Secondary | ICD-10-CM | POA: Diagnosis not present

## 2022-03-04 DIAGNOSIS — R278 Other lack of coordination: Secondary | ICD-10-CM | POA: Diagnosis not present

## 2022-03-07 DIAGNOSIS — R0902 Hypoxemia: Secondary | ICD-10-CM | POA: Diagnosis not present

## 2022-03-07 DIAGNOSIS — J45909 Unspecified asthma, uncomplicated: Secondary | ICD-10-CM | POA: Diagnosis not present

## 2022-03-07 DIAGNOSIS — G4733 Obstructive sleep apnea (adult) (pediatric): Secondary | ICD-10-CM | POA: Diagnosis not present

## 2022-03-07 DIAGNOSIS — J9610 Chronic respiratory failure, unspecified whether with hypoxia or hypercapnia: Secondary | ICD-10-CM | POA: Diagnosis not present

## 2022-03-08 DIAGNOSIS — M6281 Muscle weakness (generalized): Secondary | ICD-10-CM | POA: Diagnosis not present

## 2022-03-08 DIAGNOSIS — R278 Other lack of coordination: Secondary | ICD-10-CM | POA: Diagnosis not present

## 2022-03-08 DIAGNOSIS — R296 Repeated falls: Secondary | ICD-10-CM | POA: Diagnosis not present

## 2022-03-11 DIAGNOSIS — M6281 Muscle weakness (generalized): Secondary | ICD-10-CM | POA: Diagnosis not present

## 2022-03-11 DIAGNOSIS — R2689 Other abnormalities of gait and mobility: Secondary | ICD-10-CM | POA: Diagnosis not present

## 2022-03-11 DIAGNOSIS — J4520 Mild intermittent asthma, uncomplicated: Secondary | ICD-10-CM | POA: Diagnosis not present

## 2022-03-11 DIAGNOSIS — R296 Repeated falls: Secondary | ICD-10-CM | POA: Diagnosis not present

## 2022-03-11 DIAGNOSIS — J9610 Chronic respiratory failure, unspecified whether with hypoxia or hypercapnia: Secondary | ICD-10-CM | POA: Diagnosis not present

## 2022-03-11 DIAGNOSIS — J45909 Unspecified asthma, uncomplicated: Secondary | ICD-10-CM | POA: Diagnosis not present

## 2022-03-11 DIAGNOSIS — R278 Other lack of coordination: Secondary | ICD-10-CM | POA: Diagnosis not present

## 2022-03-11 DIAGNOSIS — G4733 Obstructive sleep apnea (adult) (pediatric): Secondary | ICD-10-CM | POA: Diagnosis not present

## 2022-03-14 DIAGNOSIS — M6281 Muscle weakness (generalized): Secondary | ICD-10-CM | POA: Diagnosis not present

## 2022-03-14 DIAGNOSIS — R296 Repeated falls: Secondary | ICD-10-CM | POA: Diagnosis not present

## 2022-03-14 DIAGNOSIS — R278 Other lack of coordination: Secondary | ICD-10-CM | POA: Diagnosis not present

## 2022-03-14 DIAGNOSIS — R2689 Other abnormalities of gait and mobility: Secondary | ICD-10-CM | POA: Diagnosis not present

## 2022-03-15 DIAGNOSIS — R2689 Other abnormalities of gait and mobility: Secondary | ICD-10-CM | POA: Diagnosis not present

## 2022-03-15 DIAGNOSIS — R296 Repeated falls: Secondary | ICD-10-CM | POA: Diagnosis not present

## 2022-03-15 DIAGNOSIS — M6281 Muscle weakness (generalized): Secondary | ICD-10-CM | POA: Diagnosis not present

## 2022-03-15 DIAGNOSIS — R278 Other lack of coordination: Secondary | ICD-10-CM | POA: Diagnosis not present

## 2022-03-16 DIAGNOSIS — M6281 Muscle weakness (generalized): Secondary | ICD-10-CM | POA: Diagnosis not present

## 2022-03-16 DIAGNOSIS — R2689 Other abnormalities of gait and mobility: Secondary | ICD-10-CM | POA: Diagnosis not present

## 2022-03-16 DIAGNOSIS — R278 Other lack of coordination: Secondary | ICD-10-CM | POA: Diagnosis not present

## 2022-03-16 DIAGNOSIS — R296 Repeated falls: Secondary | ICD-10-CM | POA: Diagnosis not present

## 2022-03-17 DIAGNOSIS — R278 Other lack of coordination: Secondary | ICD-10-CM | POA: Diagnosis not present

## 2022-03-17 DIAGNOSIS — R2689 Other abnormalities of gait and mobility: Secondary | ICD-10-CM | POA: Diagnosis not present

## 2022-03-17 DIAGNOSIS — R296 Repeated falls: Secondary | ICD-10-CM | POA: Diagnosis not present

## 2022-03-17 DIAGNOSIS — M6281 Muscle weakness (generalized): Secondary | ICD-10-CM | POA: Diagnosis not present

## 2022-03-18 DIAGNOSIS — R2689 Other abnormalities of gait and mobility: Secondary | ICD-10-CM | POA: Diagnosis not present

## 2022-03-18 DIAGNOSIS — R296 Repeated falls: Secondary | ICD-10-CM | POA: Diagnosis not present

## 2022-03-18 DIAGNOSIS — R278 Other lack of coordination: Secondary | ICD-10-CM | POA: Diagnosis not present

## 2022-03-18 DIAGNOSIS — M6281 Muscle weakness (generalized): Secondary | ICD-10-CM | POA: Diagnosis not present

## 2022-03-21 DIAGNOSIS — R2689 Other abnormalities of gait and mobility: Secondary | ICD-10-CM | POA: Diagnosis not present

## 2022-03-21 DIAGNOSIS — R278 Other lack of coordination: Secondary | ICD-10-CM | POA: Diagnosis not present

## 2022-03-21 DIAGNOSIS — M6281 Muscle weakness (generalized): Secondary | ICD-10-CM | POA: Diagnosis not present

## 2022-03-21 DIAGNOSIS — R296 Repeated falls: Secondary | ICD-10-CM | POA: Diagnosis not present

## 2022-03-22 DIAGNOSIS — R2689 Other abnormalities of gait and mobility: Secondary | ICD-10-CM | POA: Diagnosis not present

## 2022-03-22 DIAGNOSIS — R296 Repeated falls: Secondary | ICD-10-CM | POA: Diagnosis not present

## 2022-03-22 DIAGNOSIS — M6281 Muscle weakness (generalized): Secondary | ICD-10-CM | POA: Diagnosis not present

## 2022-03-22 DIAGNOSIS — R278 Other lack of coordination: Secondary | ICD-10-CM | POA: Diagnosis not present

## 2022-03-23 DIAGNOSIS — R2689 Other abnormalities of gait and mobility: Secondary | ICD-10-CM | POA: Diagnosis not present

## 2022-03-23 DIAGNOSIS — M6281 Muscle weakness (generalized): Secondary | ICD-10-CM | POA: Diagnosis not present

## 2022-03-23 DIAGNOSIS — R296 Repeated falls: Secondary | ICD-10-CM | POA: Diagnosis not present

## 2022-03-23 DIAGNOSIS — R278 Other lack of coordination: Secondary | ICD-10-CM | POA: Diagnosis not present

## 2022-03-24 DIAGNOSIS — R2689 Other abnormalities of gait and mobility: Secondary | ICD-10-CM | POA: Diagnosis not present

## 2022-03-24 DIAGNOSIS — M6281 Muscle weakness (generalized): Secondary | ICD-10-CM | POA: Diagnosis not present

## 2022-03-24 DIAGNOSIS — R296 Repeated falls: Secondary | ICD-10-CM | POA: Diagnosis not present

## 2022-03-24 DIAGNOSIS — R278 Other lack of coordination: Secondary | ICD-10-CM | POA: Diagnosis not present

## 2022-03-25 DIAGNOSIS — R278 Other lack of coordination: Secondary | ICD-10-CM | POA: Diagnosis not present

## 2022-03-25 DIAGNOSIS — R2689 Other abnormalities of gait and mobility: Secondary | ICD-10-CM | POA: Diagnosis not present

## 2022-03-25 DIAGNOSIS — M6281 Muscle weakness (generalized): Secondary | ICD-10-CM | POA: Diagnosis not present

## 2022-03-25 DIAGNOSIS — R296 Repeated falls: Secondary | ICD-10-CM | POA: Diagnosis not present

## 2022-03-28 DIAGNOSIS — M6281 Muscle weakness (generalized): Secondary | ICD-10-CM | POA: Diagnosis not present

## 2022-03-28 DIAGNOSIS — R2689 Other abnormalities of gait and mobility: Secondary | ICD-10-CM | POA: Diagnosis not present

## 2022-03-28 DIAGNOSIS — R296 Repeated falls: Secondary | ICD-10-CM | POA: Diagnosis not present

## 2022-03-28 DIAGNOSIS — R278 Other lack of coordination: Secondary | ICD-10-CM | POA: Diagnosis not present

## 2022-03-29 DIAGNOSIS — R2689 Other abnormalities of gait and mobility: Secondary | ICD-10-CM | POA: Diagnosis not present

## 2022-03-29 DIAGNOSIS — R296 Repeated falls: Secondary | ICD-10-CM | POA: Diagnosis not present

## 2022-03-29 DIAGNOSIS — M6281 Muscle weakness (generalized): Secondary | ICD-10-CM | POA: Diagnosis not present

## 2022-03-29 DIAGNOSIS — R278 Other lack of coordination: Secondary | ICD-10-CM | POA: Diagnosis not present

## 2022-03-31 DIAGNOSIS — R278 Other lack of coordination: Secondary | ICD-10-CM | POA: Diagnosis not present

## 2022-03-31 DIAGNOSIS — M6281 Muscle weakness (generalized): Secondary | ICD-10-CM | POA: Diagnosis not present

## 2022-03-31 DIAGNOSIS — R2689 Other abnormalities of gait and mobility: Secondary | ICD-10-CM | POA: Diagnosis not present

## 2022-03-31 DIAGNOSIS — R296 Repeated falls: Secondary | ICD-10-CM | POA: Diagnosis not present

## 2022-04-01 DIAGNOSIS — R278 Other lack of coordination: Secondary | ICD-10-CM | POA: Diagnosis not present

## 2022-04-01 DIAGNOSIS — R2689 Other abnormalities of gait and mobility: Secondary | ICD-10-CM | POA: Diagnosis not present

## 2022-04-01 DIAGNOSIS — R296 Repeated falls: Secondary | ICD-10-CM | POA: Diagnosis not present

## 2022-04-01 DIAGNOSIS — M6281 Muscle weakness (generalized): Secondary | ICD-10-CM | POA: Diagnosis not present

## 2022-04-04 DIAGNOSIS — R278 Other lack of coordination: Secondary | ICD-10-CM | POA: Diagnosis not present

## 2022-04-04 DIAGNOSIS — R296 Repeated falls: Secondary | ICD-10-CM | POA: Diagnosis not present

## 2022-04-04 DIAGNOSIS — M6281 Muscle weakness (generalized): Secondary | ICD-10-CM | POA: Diagnosis not present

## 2022-04-04 DIAGNOSIS — R2689 Other abnormalities of gait and mobility: Secondary | ICD-10-CM | POA: Diagnosis not present

## 2022-04-05 DIAGNOSIS — R2689 Other abnormalities of gait and mobility: Secondary | ICD-10-CM | POA: Diagnosis not present

## 2022-04-05 DIAGNOSIS — R278 Other lack of coordination: Secondary | ICD-10-CM | POA: Diagnosis not present

## 2022-04-05 DIAGNOSIS — M6281 Muscle weakness (generalized): Secondary | ICD-10-CM | POA: Diagnosis not present

## 2022-04-05 DIAGNOSIS — R296 Repeated falls: Secondary | ICD-10-CM | POA: Diagnosis not present

## 2022-04-06 DIAGNOSIS — R296 Repeated falls: Secondary | ICD-10-CM | POA: Diagnosis not present

## 2022-04-06 DIAGNOSIS — R0902 Hypoxemia: Secondary | ICD-10-CM | POA: Diagnosis not present

## 2022-04-06 DIAGNOSIS — J45909 Unspecified asthma, uncomplicated: Secondary | ICD-10-CM | POA: Diagnosis not present

## 2022-04-06 DIAGNOSIS — R278 Other lack of coordination: Secondary | ICD-10-CM | POA: Diagnosis not present

## 2022-04-06 DIAGNOSIS — M6281 Muscle weakness (generalized): Secondary | ICD-10-CM | POA: Diagnosis not present

## 2022-04-06 DIAGNOSIS — R2689 Other abnormalities of gait and mobility: Secondary | ICD-10-CM | POA: Diagnosis not present

## 2022-04-06 DIAGNOSIS — G4733 Obstructive sleep apnea (adult) (pediatric): Secondary | ICD-10-CM | POA: Diagnosis not present

## 2022-04-06 DIAGNOSIS — J9610 Chronic respiratory failure, unspecified whether with hypoxia or hypercapnia: Secondary | ICD-10-CM | POA: Diagnosis not present

## 2022-04-07 DIAGNOSIS — R2689 Other abnormalities of gait and mobility: Secondary | ICD-10-CM | POA: Diagnosis not present

## 2022-04-07 DIAGNOSIS — R296 Repeated falls: Secondary | ICD-10-CM | POA: Diagnosis not present

## 2022-04-07 DIAGNOSIS — R278 Other lack of coordination: Secondary | ICD-10-CM | POA: Diagnosis not present

## 2022-04-07 DIAGNOSIS — M6281 Muscle weakness (generalized): Secondary | ICD-10-CM | POA: Diagnosis not present

## 2022-04-10 DIAGNOSIS — G4733 Obstructive sleep apnea (adult) (pediatric): Secondary | ICD-10-CM | POA: Diagnosis not present

## 2022-04-10 DIAGNOSIS — J4520 Mild intermittent asthma, uncomplicated: Secondary | ICD-10-CM | POA: Diagnosis not present

## 2022-04-10 DIAGNOSIS — J45909 Unspecified asthma, uncomplicated: Secondary | ICD-10-CM | POA: Diagnosis not present

## 2022-04-10 DIAGNOSIS — J9610 Chronic respiratory failure, unspecified whether with hypoxia or hypercapnia: Secondary | ICD-10-CM | POA: Diagnosis not present

## 2022-04-12 DIAGNOSIS — R296 Repeated falls: Secondary | ICD-10-CM | POA: Diagnosis not present

## 2022-04-12 DIAGNOSIS — R2689 Other abnormalities of gait and mobility: Secondary | ICD-10-CM | POA: Diagnosis not present

## 2022-04-12 DIAGNOSIS — R278 Other lack of coordination: Secondary | ICD-10-CM | POA: Diagnosis not present

## 2022-04-12 DIAGNOSIS — M6281 Muscle weakness (generalized): Secondary | ICD-10-CM | POA: Diagnosis not present

## 2022-04-20 DIAGNOSIS — R2689 Other abnormalities of gait and mobility: Secondary | ICD-10-CM | POA: Diagnosis not present

## 2022-04-22 DIAGNOSIS — R2689 Other abnormalities of gait and mobility: Secondary | ICD-10-CM | POA: Diagnosis not present

## 2022-04-26 DIAGNOSIS — R2689 Other abnormalities of gait and mobility: Secondary | ICD-10-CM | POA: Diagnosis not present

## 2022-04-28 DIAGNOSIS — R2689 Other abnormalities of gait and mobility: Secondary | ICD-10-CM | POA: Diagnosis not present

## 2022-04-29 DIAGNOSIS — R2689 Other abnormalities of gait and mobility: Secondary | ICD-10-CM | POA: Diagnosis not present

## 2022-05-03 DIAGNOSIS — R2689 Other abnormalities of gait and mobility: Secondary | ICD-10-CM | POA: Diagnosis not present

## 2022-05-06 DIAGNOSIS — R2689 Other abnormalities of gait and mobility: Secondary | ICD-10-CM | POA: Diagnosis not present

## 2022-05-07 DIAGNOSIS — G4733 Obstructive sleep apnea (adult) (pediatric): Secondary | ICD-10-CM | POA: Diagnosis not present

## 2022-05-07 DIAGNOSIS — J9610 Chronic respiratory failure, unspecified whether with hypoxia or hypercapnia: Secondary | ICD-10-CM | POA: Diagnosis not present

## 2022-05-07 DIAGNOSIS — R0902 Hypoxemia: Secondary | ICD-10-CM | POA: Diagnosis not present

## 2022-05-07 DIAGNOSIS — J45909 Unspecified asthma, uncomplicated: Secondary | ICD-10-CM | POA: Diagnosis not present

## 2022-05-09 DIAGNOSIS — R2689 Other abnormalities of gait and mobility: Secondary | ICD-10-CM | POA: Diagnosis not present

## 2022-05-12 DIAGNOSIS — R2689 Other abnormalities of gait and mobility: Secondary | ICD-10-CM | POA: Diagnosis not present

## 2022-05-17 DIAGNOSIS — R2689 Other abnormalities of gait and mobility: Secondary | ICD-10-CM | POA: Diagnosis not present

## 2022-05-19 DIAGNOSIS — R2689 Other abnormalities of gait and mobility: Secondary | ICD-10-CM | POA: Diagnosis not present

## 2022-05-25 DIAGNOSIS — R2689 Other abnormalities of gait and mobility: Secondary | ICD-10-CM | POA: Diagnosis not present

## 2022-05-27 DIAGNOSIS — R2689 Other abnormalities of gait and mobility: Secondary | ICD-10-CM | POA: Diagnosis not present

## 2022-05-31 DIAGNOSIS — R2689 Other abnormalities of gait and mobility: Secondary | ICD-10-CM | POA: Diagnosis not present

## 2022-06-01 DIAGNOSIS — R2689 Other abnormalities of gait and mobility: Secondary | ICD-10-CM | POA: Diagnosis not present

## 2022-06-06 DIAGNOSIS — J45909 Unspecified asthma, uncomplicated: Secondary | ICD-10-CM | POA: Diagnosis not present

## 2022-06-06 DIAGNOSIS — G4733 Obstructive sleep apnea (adult) (pediatric): Secondary | ICD-10-CM | POA: Diagnosis not present

## 2022-06-06 DIAGNOSIS — R0902 Hypoxemia: Secondary | ICD-10-CM | POA: Diagnosis not present

## 2022-06-06 DIAGNOSIS — J9610 Chronic respiratory failure, unspecified whether with hypoxia or hypercapnia: Secondary | ICD-10-CM | POA: Diagnosis not present

## 2022-06-09 DIAGNOSIS — R2689 Other abnormalities of gait and mobility: Secondary | ICD-10-CM | POA: Diagnosis not present

## 2022-06-10 DIAGNOSIS — G4733 Obstructive sleep apnea (adult) (pediatric): Secondary | ICD-10-CM | POA: Diagnosis not present

## 2022-06-10 DIAGNOSIS — R2689 Other abnormalities of gait and mobility: Secondary | ICD-10-CM | POA: Diagnosis not present

## 2022-06-10 DIAGNOSIS — J4520 Mild intermittent asthma, uncomplicated: Secondary | ICD-10-CM | POA: Diagnosis not present

## 2022-06-10 DIAGNOSIS — J45909 Unspecified asthma, uncomplicated: Secondary | ICD-10-CM | POA: Diagnosis not present

## 2022-06-10 DIAGNOSIS — J9610 Chronic respiratory failure, unspecified whether with hypoxia or hypercapnia: Secondary | ICD-10-CM | POA: Diagnosis not present

## 2022-06-15 DIAGNOSIS — J9611 Chronic respiratory failure with hypoxia: Secondary | ICD-10-CM | POA: Diagnosis not present

## 2022-06-15 DIAGNOSIS — E1121 Type 2 diabetes mellitus with diabetic nephropathy: Secondary | ICD-10-CM | POA: Diagnosis not present

## 2022-06-15 DIAGNOSIS — E782 Mixed hyperlipidemia: Secondary | ICD-10-CM | POA: Diagnosis not present

## 2022-06-15 DIAGNOSIS — H35033 Hypertensive retinopathy, bilateral: Secondary | ICD-10-CM | POA: Diagnosis not present

## 2022-06-15 DIAGNOSIS — G4733 Obstructive sleep apnea (adult) (pediatric): Secondary | ICD-10-CM | POA: Diagnosis not present

## 2022-06-15 DIAGNOSIS — J4520 Mild intermittent asthma, uncomplicated: Secondary | ICD-10-CM | POA: Diagnosis not present

## 2022-06-15 DIAGNOSIS — Z0000 Encounter for general adult medical examination without abnormal findings: Secondary | ICD-10-CM | POA: Diagnosis not present

## 2022-06-15 DIAGNOSIS — R2689 Other abnormalities of gait and mobility: Secondary | ICD-10-CM | POA: Diagnosis not present

## 2022-06-15 DIAGNOSIS — R69 Illness, unspecified: Secondary | ICD-10-CM | POA: Diagnosis not present

## 2022-06-15 DIAGNOSIS — D509 Iron deficiency anemia, unspecified: Secondary | ICD-10-CM | POA: Diagnosis not present

## 2022-06-16 ENCOUNTER — Other Ambulatory Visit: Payer: Self-pay | Admitting: Family Medicine

## 2022-06-16 DIAGNOSIS — Z1231 Encounter for screening mammogram for malignant neoplasm of breast: Secondary | ICD-10-CM

## 2022-07-07 DIAGNOSIS — J45909 Unspecified asthma, uncomplicated: Secondary | ICD-10-CM | POA: Diagnosis not present

## 2022-07-07 DIAGNOSIS — R0902 Hypoxemia: Secondary | ICD-10-CM | POA: Diagnosis not present

## 2022-07-07 DIAGNOSIS — J9610 Chronic respiratory failure, unspecified whether with hypoxia or hypercapnia: Secondary | ICD-10-CM | POA: Diagnosis not present

## 2022-07-07 DIAGNOSIS — G4733 Obstructive sleep apnea (adult) (pediatric): Secondary | ICD-10-CM | POA: Diagnosis not present

## 2022-07-11 DIAGNOSIS — J9610 Chronic respiratory failure, unspecified whether with hypoxia or hypercapnia: Secondary | ICD-10-CM | POA: Diagnosis not present

## 2022-07-11 DIAGNOSIS — J45909 Unspecified asthma, uncomplicated: Secondary | ICD-10-CM | POA: Diagnosis not present

## 2022-07-11 DIAGNOSIS — G4733 Obstructive sleep apnea (adult) (pediatric): Secondary | ICD-10-CM | POA: Diagnosis not present

## 2022-07-11 DIAGNOSIS — J4520 Mild intermittent asthma, uncomplicated: Secondary | ICD-10-CM | POA: Diagnosis not present

## 2022-08-07 DIAGNOSIS — R0902 Hypoxemia: Secondary | ICD-10-CM | POA: Diagnosis not present

## 2022-08-07 DIAGNOSIS — J45909 Unspecified asthma, uncomplicated: Secondary | ICD-10-CM | POA: Diagnosis not present

## 2022-08-07 DIAGNOSIS — G4733 Obstructive sleep apnea (adult) (pediatric): Secondary | ICD-10-CM | POA: Diagnosis not present

## 2022-08-07 DIAGNOSIS — J9610 Chronic respiratory failure, unspecified whether with hypoxia or hypercapnia: Secondary | ICD-10-CM | POA: Diagnosis not present

## 2022-08-11 DIAGNOSIS — J9610 Chronic respiratory failure, unspecified whether with hypoxia or hypercapnia: Secondary | ICD-10-CM | POA: Diagnosis not present

## 2022-08-11 DIAGNOSIS — J4520 Mild intermittent asthma, uncomplicated: Secondary | ICD-10-CM | POA: Diagnosis not present

## 2022-08-11 DIAGNOSIS — G4733 Obstructive sleep apnea (adult) (pediatric): Secondary | ICD-10-CM | POA: Diagnosis not present

## 2022-08-11 DIAGNOSIS — J45909 Unspecified asthma, uncomplicated: Secondary | ICD-10-CM | POA: Diagnosis not present

## 2022-08-17 ENCOUNTER — Ambulatory Visit: Payer: Medicare HMO

## 2022-08-26 DIAGNOSIS — R2681 Unsteadiness on feet: Secondary | ICD-10-CM | POA: Diagnosis not present

## 2022-08-26 DIAGNOSIS — R296 Repeated falls: Secondary | ICD-10-CM | POA: Diagnosis not present

## 2022-08-26 DIAGNOSIS — R2689 Other abnormalities of gait and mobility: Secondary | ICD-10-CM | POA: Diagnosis not present

## 2022-08-29 DIAGNOSIS — R296 Repeated falls: Secondary | ICD-10-CM | POA: Diagnosis not present

## 2022-08-29 DIAGNOSIS — R2689 Other abnormalities of gait and mobility: Secondary | ICD-10-CM | POA: Diagnosis not present

## 2022-08-29 DIAGNOSIS — R2681 Unsteadiness on feet: Secondary | ICD-10-CM | POA: Diagnosis not present

## 2022-08-30 DIAGNOSIS — R296 Repeated falls: Secondary | ICD-10-CM | POA: Diagnosis not present

## 2022-08-30 DIAGNOSIS — R2689 Other abnormalities of gait and mobility: Secondary | ICD-10-CM | POA: Diagnosis not present

## 2022-08-30 DIAGNOSIS — R2681 Unsteadiness on feet: Secondary | ICD-10-CM | POA: Diagnosis not present

## 2022-09-02 DIAGNOSIS — R296 Repeated falls: Secondary | ICD-10-CM | POA: Diagnosis not present

## 2022-09-02 DIAGNOSIS — R2681 Unsteadiness on feet: Secondary | ICD-10-CM | POA: Diagnosis not present

## 2022-09-02 DIAGNOSIS — R2689 Other abnormalities of gait and mobility: Secondary | ICD-10-CM | POA: Diagnosis not present

## 2022-09-05 DIAGNOSIS — G4733 Obstructive sleep apnea (adult) (pediatric): Secondary | ICD-10-CM | POA: Diagnosis not present

## 2022-09-05 DIAGNOSIS — J45909 Unspecified asthma, uncomplicated: Secondary | ICD-10-CM | POA: Diagnosis not present

## 2022-09-05 DIAGNOSIS — R0902 Hypoxemia: Secondary | ICD-10-CM | POA: Diagnosis not present

## 2022-09-05 DIAGNOSIS — J9610 Chronic respiratory failure, unspecified whether with hypoxia or hypercapnia: Secondary | ICD-10-CM | POA: Diagnosis not present

## 2022-09-06 DIAGNOSIS — R2681 Unsteadiness on feet: Secondary | ICD-10-CM | POA: Diagnosis not present

## 2022-09-06 DIAGNOSIS — R296 Repeated falls: Secondary | ICD-10-CM | POA: Diagnosis not present

## 2022-09-06 DIAGNOSIS — R2689 Other abnormalities of gait and mobility: Secondary | ICD-10-CM | POA: Diagnosis not present

## 2022-09-07 DIAGNOSIS — R2681 Unsteadiness on feet: Secondary | ICD-10-CM | POA: Diagnosis not present

## 2022-09-07 DIAGNOSIS — R296 Repeated falls: Secondary | ICD-10-CM | POA: Diagnosis not present

## 2022-09-07 DIAGNOSIS — R2689 Other abnormalities of gait and mobility: Secondary | ICD-10-CM | POA: Diagnosis not present

## 2022-09-08 DIAGNOSIS — R296 Repeated falls: Secondary | ICD-10-CM | POA: Diagnosis not present

## 2022-09-08 DIAGNOSIS — R2689 Other abnormalities of gait and mobility: Secondary | ICD-10-CM | POA: Diagnosis not present

## 2022-09-08 DIAGNOSIS — R2681 Unsteadiness on feet: Secondary | ICD-10-CM | POA: Diagnosis not present

## 2022-09-12 DIAGNOSIS — R2681 Unsteadiness on feet: Secondary | ICD-10-CM | POA: Diagnosis not present

## 2022-09-12 DIAGNOSIS — R2689 Other abnormalities of gait and mobility: Secondary | ICD-10-CM | POA: Diagnosis not present

## 2022-09-12 DIAGNOSIS — R296 Repeated falls: Secondary | ICD-10-CM | POA: Diagnosis not present

## 2022-09-12 DIAGNOSIS — M25532 Pain in left wrist: Secondary | ICD-10-CM | POA: Diagnosis not present

## 2022-09-14 DIAGNOSIS — R296 Repeated falls: Secondary | ICD-10-CM | POA: Diagnosis not present

## 2022-09-14 DIAGNOSIS — R2689 Other abnormalities of gait and mobility: Secondary | ICD-10-CM | POA: Diagnosis not present

## 2022-09-14 DIAGNOSIS — R2681 Unsteadiness on feet: Secondary | ICD-10-CM | POA: Diagnosis not present

## 2022-09-16 DIAGNOSIS — R2689 Other abnormalities of gait and mobility: Secondary | ICD-10-CM | POA: Diagnosis not present

## 2022-09-16 DIAGNOSIS — R2681 Unsteadiness on feet: Secondary | ICD-10-CM | POA: Diagnosis not present

## 2022-09-16 DIAGNOSIS — R296 Repeated falls: Secondary | ICD-10-CM | POA: Diagnosis not present

## 2022-09-20 DIAGNOSIS — R296 Repeated falls: Secondary | ICD-10-CM | POA: Diagnosis not present

## 2022-09-20 DIAGNOSIS — R2681 Unsteadiness on feet: Secondary | ICD-10-CM | POA: Diagnosis not present

## 2022-09-20 DIAGNOSIS — R2689 Other abnormalities of gait and mobility: Secondary | ICD-10-CM | POA: Diagnosis not present

## 2022-09-21 DIAGNOSIS — R296 Repeated falls: Secondary | ICD-10-CM | POA: Diagnosis not present

## 2022-09-21 DIAGNOSIS — R2689 Other abnormalities of gait and mobility: Secondary | ICD-10-CM | POA: Diagnosis not present

## 2022-09-21 DIAGNOSIS — R2681 Unsteadiness on feet: Secondary | ICD-10-CM | POA: Diagnosis not present

## 2022-09-23 DIAGNOSIS — R2681 Unsteadiness on feet: Secondary | ICD-10-CM | POA: Diagnosis not present

## 2022-09-23 DIAGNOSIS — S52502A Unspecified fracture of the lower end of left radius, initial encounter for closed fracture: Secondary | ICD-10-CM | POA: Diagnosis not present

## 2022-09-23 DIAGNOSIS — R2689 Other abnormalities of gait and mobility: Secondary | ICD-10-CM | POA: Diagnosis not present

## 2022-09-23 DIAGNOSIS — R296 Repeated falls: Secondary | ICD-10-CM | POA: Diagnosis not present

## 2022-09-26 DIAGNOSIS — R2689 Other abnormalities of gait and mobility: Secondary | ICD-10-CM | POA: Diagnosis not present

## 2022-09-26 DIAGNOSIS — R2681 Unsteadiness on feet: Secondary | ICD-10-CM | POA: Diagnosis not present

## 2022-09-26 DIAGNOSIS — R296 Repeated falls: Secondary | ICD-10-CM | POA: Diagnosis not present

## 2022-09-28 DIAGNOSIS — R296 Repeated falls: Secondary | ICD-10-CM | POA: Diagnosis not present

## 2022-09-28 DIAGNOSIS — R2689 Other abnormalities of gait and mobility: Secondary | ICD-10-CM | POA: Diagnosis not present

## 2022-09-28 DIAGNOSIS — R2681 Unsteadiness on feet: Secondary | ICD-10-CM | POA: Diagnosis not present

## 2022-09-29 DIAGNOSIS — R296 Repeated falls: Secondary | ICD-10-CM | POA: Diagnosis not present

## 2022-09-29 DIAGNOSIS — R2689 Other abnormalities of gait and mobility: Secondary | ICD-10-CM | POA: Diagnosis not present

## 2022-09-29 DIAGNOSIS — R2681 Unsteadiness on feet: Secondary | ICD-10-CM | POA: Diagnosis not present

## 2022-10-03 DIAGNOSIS — R296 Repeated falls: Secondary | ICD-10-CM | POA: Diagnosis not present

## 2022-10-03 DIAGNOSIS — R2681 Unsteadiness on feet: Secondary | ICD-10-CM | POA: Diagnosis not present

## 2022-10-03 DIAGNOSIS — R2689 Other abnormalities of gait and mobility: Secondary | ICD-10-CM | POA: Diagnosis not present

## 2022-10-04 DIAGNOSIS — R2689 Other abnormalities of gait and mobility: Secondary | ICD-10-CM | POA: Diagnosis not present

## 2022-10-04 DIAGNOSIS — R2681 Unsteadiness on feet: Secondary | ICD-10-CM | POA: Diagnosis not present

## 2022-10-04 DIAGNOSIS — R296 Repeated falls: Secondary | ICD-10-CM | POA: Diagnosis not present

## 2022-10-04 DIAGNOSIS — S52502D Unspecified fracture of the lower end of left radius, subsequent encounter for closed fracture with routine healing: Secondary | ICD-10-CM | POA: Diagnosis not present

## 2022-10-06 DIAGNOSIS — G4733 Obstructive sleep apnea (adult) (pediatric): Secondary | ICD-10-CM | POA: Diagnosis not present

## 2022-10-06 DIAGNOSIS — J45909 Unspecified asthma, uncomplicated: Secondary | ICD-10-CM | POA: Diagnosis not present

## 2022-10-06 DIAGNOSIS — J9610 Chronic respiratory failure, unspecified whether with hypoxia or hypercapnia: Secondary | ICD-10-CM | POA: Diagnosis not present

## 2022-10-06 DIAGNOSIS — R0902 Hypoxemia: Secondary | ICD-10-CM | POA: Diagnosis not present

## 2022-10-11 DIAGNOSIS — R296 Repeated falls: Secondary | ICD-10-CM | POA: Diagnosis not present

## 2022-10-11 DIAGNOSIS — R2681 Unsteadiness on feet: Secondary | ICD-10-CM | POA: Diagnosis not present

## 2022-10-11 DIAGNOSIS — R2689 Other abnormalities of gait and mobility: Secondary | ICD-10-CM | POA: Diagnosis not present

## 2022-10-12 DIAGNOSIS — R296 Repeated falls: Secondary | ICD-10-CM | POA: Diagnosis not present

## 2022-10-12 DIAGNOSIS — R2681 Unsteadiness on feet: Secondary | ICD-10-CM | POA: Diagnosis not present

## 2022-10-12 DIAGNOSIS — R2689 Other abnormalities of gait and mobility: Secondary | ICD-10-CM | POA: Diagnosis not present

## 2022-10-17 DIAGNOSIS — R2681 Unsteadiness on feet: Secondary | ICD-10-CM | POA: Diagnosis not present

## 2022-10-17 DIAGNOSIS — R2689 Other abnormalities of gait and mobility: Secondary | ICD-10-CM | POA: Diagnosis not present

## 2022-10-17 DIAGNOSIS — R296 Repeated falls: Secondary | ICD-10-CM | POA: Diagnosis not present

## 2022-10-19 DIAGNOSIS — R2689 Other abnormalities of gait and mobility: Secondary | ICD-10-CM | POA: Diagnosis not present

## 2022-10-19 DIAGNOSIS — R2681 Unsteadiness on feet: Secondary | ICD-10-CM | POA: Diagnosis not present

## 2022-10-19 DIAGNOSIS — R296 Repeated falls: Secondary | ICD-10-CM | POA: Diagnosis not present

## 2022-10-25 DIAGNOSIS — S52502A Unspecified fracture of the lower end of left radius, initial encounter for closed fracture: Secondary | ICD-10-CM | POA: Diagnosis not present

## 2022-10-28 DIAGNOSIS — R2681 Unsteadiness on feet: Secondary | ICD-10-CM | POA: Diagnosis not present

## 2022-10-28 DIAGNOSIS — R2689 Other abnormalities of gait and mobility: Secondary | ICD-10-CM | POA: Diagnosis not present

## 2022-10-28 DIAGNOSIS — R296 Repeated falls: Secondary | ICD-10-CM | POA: Diagnosis not present

## 2022-11-02 DIAGNOSIS — R296 Repeated falls: Secondary | ICD-10-CM | POA: Diagnosis not present

## 2022-11-02 DIAGNOSIS — R2689 Other abnormalities of gait and mobility: Secondary | ICD-10-CM | POA: Diagnosis not present

## 2022-11-02 DIAGNOSIS — R2681 Unsteadiness on feet: Secondary | ICD-10-CM | POA: Diagnosis not present

## 2022-11-05 DIAGNOSIS — J45909 Unspecified asthma, uncomplicated: Secondary | ICD-10-CM | POA: Diagnosis not present

## 2022-11-05 DIAGNOSIS — J9610 Chronic respiratory failure, unspecified whether with hypoxia or hypercapnia: Secondary | ICD-10-CM | POA: Diagnosis not present

## 2022-11-05 DIAGNOSIS — R0902 Hypoxemia: Secondary | ICD-10-CM | POA: Diagnosis not present

## 2022-11-05 DIAGNOSIS — G4733 Obstructive sleep apnea (adult) (pediatric): Secondary | ICD-10-CM | POA: Diagnosis not present

## 2022-11-08 DIAGNOSIS — R296 Repeated falls: Secondary | ICD-10-CM | POA: Diagnosis not present

## 2022-11-08 DIAGNOSIS — R2689 Other abnormalities of gait and mobility: Secondary | ICD-10-CM | POA: Diagnosis not present

## 2022-11-08 DIAGNOSIS — R2681 Unsteadiness on feet: Secondary | ICD-10-CM | POA: Diagnosis not present

## 2022-11-10 DIAGNOSIS — R2681 Unsteadiness on feet: Secondary | ICD-10-CM | POA: Diagnosis not present

## 2022-11-10 DIAGNOSIS — R296 Repeated falls: Secondary | ICD-10-CM | POA: Diagnosis not present

## 2022-11-10 DIAGNOSIS — R2689 Other abnormalities of gait and mobility: Secondary | ICD-10-CM | POA: Diagnosis not present

## 2022-11-14 DIAGNOSIS — R2681 Unsteadiness on feet: Secondary | ICD-10-CM | POA: Diagnosis not present

## 2022-11-14 DIAGNOSIS — R296 Repeated falls: Secondary | ICD-10-CM | POA: Diagnosis not present

## 2022-11-14 DIAGNOSIS — R2689 Other abnormalities of gait and mobility: Secondary | ICD-10-CM | POA: Diagnosis not present

## 2022-11-16 DIAGNOSIS — R296 Repeated falls: Secondary | ICD-10-CM | POA: Diagnosis not present

## 2022-11-16 DIAGNOSIS — R2681 Unsteadiness on feet: Secondary | ICD-10-CM | POA: Diagnosis not present

## 2022-11-16 DIAGNOSIS — R2689 Other abnormalities of gait and mobility: Secondary | ICD-10-CM | POA: Diagnosis not present

## 2022-11-22 DIAGNOSIS — S52502D Unspecified fracture of the lower end of left radius, subsequent encounter for closed fracture with routine healing: Secondary | ICD-10-CM | POA: Diagnosis not present

## 2022-12-06 DIAGNOSIS — R0902 Hypoxemia: Secondary | ICD-10-CM | POA: Diagnosis not present

## 2022-12-06 DIAGNOSIS — J9610 Chronic respiratory failure, unspecified whether with hypoxia or hypercapnia: Secondary | ICD-10-CM | POA: Diagnosis not present

## 2022-12-06 DIAGNOSIS — J45909 Unspecified asthma, uncomplicated: Secondary | ICD-10-CM | POA: Diagnosis not present

## 2022-12-06 DIAGNOSIS — G4733 Obstructive sleep apnea (adult) (pediatric): Secondary | ICD-10-CM | POA: Diagnosis not present

## 2022-12-14 ENCOUNTER — Other Ambulatory Visit: Payer: Self-pay | Admitting: Family Medicine

## 2022-12-14 DIAGNOSIS — E782 Mixed hyperlipidemia: Secondary | ICD-10-CM | POA: Diagnosis not present

## 2022-12-14 DIAGNOSIS — E662 Morbid (severe) obesity with alveolar hypoventilation: Secondary | ICD-10-CM | POA: Diagnosis not present

## 2022-12-14 DIAGNOSIS — D509 Iron deficiency anemia, unspecified: Secondary | ICD-10-CM | POA: Diagnosis not present

## 2022-12-14 DIAGNOSIS — E1121 Type 2 diabetes mellitus with diabetic nephropathy: Secondary | ICD-10-CM | POA: Diagnosis not present

## 2022-12-14 DIAGNOSIS — R32 Unspecified urinary incontinence: Secondary | ICD-10-CM | POA: Diagnosis not present

## 2022-12-14 DIAGNOSIS — I129 Hypertensive chronic kidney disease with stage 1 through stage 4 chronic kidney disease, or unspecified chronic kidney disease: Secondary | ICD-10-CM | POA: Diagnosis not present

## 2022-12-14 DIAGNOSIS — F411 Generalized anxiety disorder: Secondary | ICD-10-CM | POA: Diagnosis not present

## 2022-12-14 DIAGNOSIS — F39 Unspecified mood [affective] disorder: Secondary | ICD-10-CM | POA: Diagnosis not present

## 2022-12-14 DIAGNOSIS — J9611 Chronic respiratory failure with hypoxia: Secondary | ICD-10-CM | POA: Diagnosis not present

## 2022-12-14 DIAGNOSIS — J4520 Mild intermittent asthma, uncomplicated: Secondary | ICD-10-CM | POA: Diagnosis not present

## 2022-12-14 DIAGNOSIS — Z1231 Encounter for screening mammogram for malignant neoplasm of breast: Secondary | ICD-10-CM

## 2023-01-05 DIAGNOSIS — J9610 Chronic respiratory failure, unspecified whether with hypoxia or hypercapnia: Secondary | ICD-10-CM | POA: Diagnosis not present

## 2023-01-05 DIAGNOSIS — G4733 Obstructive sleep apnea (adult) (pediatric): Secondary | ICD-10-CM | POA: Diagnosis not present

## 2023-01-05 DIAGNOSIS — J45909 Unspecified asthma, uncomplicated: Secondary | ICD-10-CM | POA: Diagnosis not present

## 2023-01-05 DIAGNOSIS — R0902 Hypoxemia: Secondary | ICD-10-CM | POA: Diagnosis not present

## 2023-01-11 ENCOUNTER — Ambulatory Visit: Payer: Medicare HMO

## 2023-01-26 ENCOUNTER — Ambulatory Visit: Payer: Medicare HMO

## 2023-02-05 DIAGNOSIS — G4733 Obstructive sleep apnea (adult) (pediatric): Secondary | ICD-10-CM | POA: Diagnosis not present

## 2023-02-05 DIAGNOSIS — R0902 Hypoxemia: Secondary | ICD-10-CM | POA: Diagnosis not present

## 2023-02-05 DIAGNOSIS — J45909 Unspecified asthma, uncomplicated: Secondary | ICD-10-CM | POA: Diagnosis not present

## 2023-02-05 DIAGNOSIS — J9610 Chronic respiratory failure, unspecified whether with hypoxia or hypercapnia: Secondary | ICD-10-CM | POA: Diagnosis not present

## 2023-03-08 DIAGNOSIS — R0902 Hypoxemia: Secondary | ICD-10-CM | POA: Diagnosis not present

## 2023-03-08 DIAGNOSIS — G4733 Obstructive sleep apnea (adult) (pediatric): Secondary | ICD-10-CM | POA: Diagnosis not present

## 2023-03-08 DIAGNOSIS — J45909 Unspecified asthma, uncomplicated: Secondary | ICD-10-CM | POA: Diagnosis not present

## 2023-03-08 DIAGNOSIS — J9610 Chronic respiratory failure, unspecified whether with hypoxia or hypercapnia: Secondary | ICD-10-CM | POA: Diagnosis not present

## 2023-04-07 DIAGNOSIS — R0902 Hypoxemia: Secondary | ICD-10-CM | POA: Diagnosis not present

## 2023-04-07 DIAGNOSIS — J45909 Unspecified asthma, uncomplicated: Secondary | ICD-10-CM | POA: Diagnosis not present

## 2023-04-07 DIAGNOSIS — J9610 Chronic respiratory failure, unspecified whether with hypoxia or hypercapnia: Secondary | ICD-10-CM | POA: Diagnosis not present

## 2023-04-07 DIAGNOSIS — G4733 Obstructive sleep apnea (adult) (pediatric): Secondary | ICD-10-CM | POA: Diagnosis not present

## 2023-04-25 DIAGNOSIS — Z008 Encounter for other general examination: Secondary | ICD-10-CM | POA: Diagnosis not present

## 2023-05-08 DIAGNOSIS — R0902 Hypoxemia: Secondary | ICD-10-CM | POA: Diagnosis not present

## 2023-05-08 DIAGNOSIS — G4733 Obstructive sleep apnea (adult) (pediatric): Secondary | ICD-10-CM | POA: Diagnosis not present

## 2023-05-08 DIAGNOSIS — J9610 Chronic respiratory failure, unspecified whether with hypoxia or hypercapnia: Secondary | ICD-10-CM | POA: Diagnosis not present

## 2023-05-08 DIAGNOSIS — J45909 Unspecified asthma, uncomplicated: Secondary | ICD-10-CM | POA: Diagnosis not present

## 2023-05-10 ENCOUNTER — Telehealth: Payer: Self-pay | Admitting: Pulmonary Disease

## 2023-05-10 DIAGNOSIS — J9611 Chronic respiratory failure with hypoxia: Secondary | ICD-10-CM

## 2023-05-10 DIAGNOSIS — G4733 Obstructive sleep apnea (adult) (pediatric): Secondary | ICD-10-CM

## 2023-05-10 NOTE — Telephone Encounter (Signed)
[facility] needs [facility]

## 2023-05-10 NOTE — Telephone Encounter (Signed)
PT just moved to [place], [place]. She could not take her 48 with her; night time 02. Needs a new RX for 02 locally and a referral to a local Pulmonologist. Please call to advise. She has not been seen in over a year and can not come here to see Dr. Craige Cotta. I will update address. She is Special Needs PT  [phone] is Cousin Katherine Basset

## 2023-05-10 NOTE — Telephone Encounter (Signed)
Called and spoke with pt and cousin who was with pt. Pt does understand that we can not send in new prescription for o2 due no recent OV. Pts and cousin would like to recommendation for a pulm in [place]. I advised pt Dr.Sood is no longer here and she states she will reach out to her pcp. Nfn

## 2023-05-16 NOTE — Telephone Encounter (Signed)
Order placed

## 2023-05-18 ENCOUNTER — Telehealth: Payer: Self-pay | Admitting: Pulmonary Disease

## 2023-05-18 DIAGNOSIS — J9611 Chronic respiratory failure with hypoxia: Secondary | ICD-10-CM

## 2023-05-18 DIAGNOSIS — G4733 Obstructive sleep apnea (adult) (pediatric): Secondary | ICD-10-CM

## 2023-05-18 NOTE — Telephone Encounter (Signed)
Patient needs referral to [facility]. Phone number is [phone]. Fax number is [phone]. Karin Golden phone number is [phone].

## 2023-05-18 NOTE — Telephone Encounter (Signed)
Ordered yesterday waiting for Dr Everardo All to co sign.

## 2023-05-19 NOTE — Telephone Encounter (Signed)
Corrected order for referral to [facility] [place] in [place], [place]

## 2023-06-07 DIAGNOSIS — R0902 Hypoxemia: Secondary | ICD-10-CM | POA: Diagnosis not present

## 2023-06-07 DIAGNOSIS — J9610 Chronic respiratory failure, unspecified whether with hypoxia or hypercapnia: Secondary | ICD-10-CM | POA: Diagnosis not present

## 2023-06-07 DIAGNOSIS — J45909 Unspecified asthma, uncomplicated: Secondary | ICD-10-CM | POA: Diagnosis not present

## 2023-06-07 DIAGNOSIS — G4733 Obstructive sleep apnea (adult) (pediatric): Secondary | ICD-10-CM | POA: Diagnosis not present

## 2023-06-28 DIAGNOSIS — E1169 Type 2 diabetes mellitus with other specified complication: Secondary | ICD-10-CM | POA: Diagnosis not present

## 2023-06-28 DIAGNOSIS — E782 Mixed hyperlipidemia: Secondary | ICD-10-CM | POA: Diagnosis not present

## 2023-06-28 DIAGNOSIS — E119 Type 2 diabetes mellitus without complications: Secondary | ICD-10-CM | POA: Diagnosis not present

## 2023-07-08 DIAGNOSIS — J45909 Unspecified asthma, uncomplicated: Secondary | ICD-10-CM | POA: Diagnosis not present

## 2023-07-08 DIAGNOSIS — G4733 Obstructive sleep apnea (adult) (pediatric): Secondary | ICD-10-CM | POA: Diagnosis not present

## 2023-07-08 DIAGNOSIS — R0902 Hypoxemia: Secondary | ICD-10-CM | POA: Diagnosis not present

## 2023-07-08 DIAGNOSIS — J9610 Chronic respiratory failure, unspecified whether with hypoxia or hypercapnia: Secondary | ICD-10-CM | POA: Diagnosis not present

## 2023-07-10 DIAGNOSIS — I739 Peripheral vascular disease, unspecified: Secondary | ICD-10-CM | POA: Diagnosis not present

## 2023-07-10 DIAGNOSIS — L853 Xerosis cutis: Secondary | ICD-10-CM | POA: Diagnosis not present

## 2023-07-10 DIAGNOSIS — B351 Tinea unguium: Secondary | ICD-10-CM | POA: Diagnosis not present

## 2023-07-10 DIAGNOSIS — B078 Other viral warts: Secondary | ICD-10-CM | POA: Diagnosis not present

## 2023-07-10 DIAGNOSIS — E1165 Type 2 diabetes mellitus with hyperglycemia: Secondary | ICD-10-CM | POA: Diagnosis not present

## 2023-07-10 DIAGNOSIS — L84 Corns and callosities: Secondary | ICD-10-CM | POA: Diagnosis not present

## 2023-07-14 DIAGNOSIS — J9610 Chronic respiratory failure, unspecified whether with hypoxia or hypercapnia: Secondary | ICD-10-CM | POA: Diagnosis not present

## 2023-07-14 DIAGNOSIS — J45909 Unspecified asthma, uncomplicated: Secondary | ICD-10-CM | POA: Diagnosis not present

## 2023-07-14 DIAGNOSIS — J4520 Mild intermittent asthma, uncomplicated: Secondary | ICD-10-CM | POA: Diagnosis not present

## 2023-07-14 DIAGNOSIS — R0902 Hypoxemia: Secondary | ICD-10-CM | POA: Diagnosis not present

## 2023-07-14 DIAGNOSIS — G4733 Obstructive sleep apnea (adult) (pediatric): Secondary | ICD-10-CM | POA: Diagnosis not present

## 2023-07-16 DIAGNOSIS — I272 Pulmonary hypertension, unspecified: Secondary | ICD-10-CM | POA: Diagnosis not present

## 2023-07-16 DIAGNOSIS — R918 Other nonspecific abnormal finding of lung field: Secondary | ICD-10-CM | POA: Diagnosis not present

## 2023-07-16 DIAGNOSIS — R079 Chest pain, unspecified: Secondary | ICD-10-CM | POA: Diagnosis not present

## 2023-07-16 DIAGNOSIS — K5730 Diverticulosis of large intestine without perforation or abscess without bleeding: Secondary | ICD-10-CM | POA: Diagnosis not present

## 2023-07-16 DIAGNOSIS — G928 Other toxic encephalopathy: Secondary | ICD-10-CM | POA: Diagnosis not present

## 2023-07-16 DIAGNOSIS — E785 Hyperlipidemia, unspecified: Secondary | ICD-10-CM | POA: Diagnosis not present

## 2023-07-16 DIAGNOSIS — I110 Hypertensive heart disease with heart failure: Secondary | ICD-10-CM | POA: Diagnosis not present

## 2023-07-16 DIAGNOSIS — J4520 Mild intermittent asthma, uncomplicated: Secondary | ICD-10-CM | POA: Diagnosis not present

## 2023-07-16 DIAGNOSIS — F840 Autistic disorder: Secondary | ICD-10-CM | POA: Diagnosis not present

## 2023-07-16 DIAGNOSIS — I10 Essential (primary) hypertension: Secondary | ICD-10-CM | POA: Diagnosis not present

## 2023-07-16 DIAGNOSIS — R7989 Other specified abnormal findings of blood chemistry: Secondary | ICD-10-CM | POA: Diagnosis not present

## 2023-07-16 DIAGNOSIS — E119 Type 2 diabetes mellitus without complications: Secondary | ICD-10-CM | POA: Diagnosis not present

## 2023-07-16 DIAGNOSIS — J9811 Atelectasis: Secondary | ICD-10-CM | POA: Diagnosis not present

## 2023-07-16 DIAGNOSIS — D72819 Decreased white blood cell count, unspecified: Secondary | ICD-10-CM | POA: Diagnosis not present

## 2023-07-16 DIAGNOSIS — G4733 Obstructive sleep apnea (adult) (pediatric): Secondary | ICD-10-CM | POA: Diagnosis not present

## 2023-07-16 DIAGNOSIS — J9601 Acute respiratory failure with hypoxia: Secondary | ICD-10-CM | POA: Diagnosis not present

## 2023-07-16 DIAGNOSIS — E7800 Pure hypercholesterolemia, unspecified: Secondary | ICD-10-CM | POA: Diagnosis not present

## 2023-07-16 DIAGNOSIS — K219 Gastro-esophageal reflux disease without esophagitis: Secondary | ICD-10-CM | POA: Diagnosis not present

## 2023-07-16 DIAGNOSIS — K5900 Constipation, unspecified: Secondary | ICD-10-CM | POA: Diagnosis not present

## 2023-07-16 DIAGNOSIS — R0602 Shortness of breath: Secondary | ICD-10-CM | POA: Diagnosis not present

## 2023-07-16 DIAGNOSIS — Z6841 Body Mass Index (BMI) 40.0 and over, adult: Secondary | ICD-10-CM | POA: Diagnosis not present

## 2023-07-16 DIAGNOSIS — F419 Anxiety disorder, unspecified: Secondary | ICD-10-CM | POA: Diagnosis not present

## 2023-07-16 DIAGNOSIS — J9602 Acute respiratory failure with hypercapnia: Secondary | ICD-10-CM | POA: Diagnosis not present

## 2023-07-16 DIAGNOSIS — I50810 Right heart failure, unspecified: Secondary | ICD-10-CM | POA: Diagnosis not present

## 2023-07-16 DIAGNOSIS — I5032 Chronic diastolic (congestive) heart failure: Secondary | ICD-10-CM | POA: Diagnosis not present

## 2023-07-16 DIAGNOSIS — F32A Depression, unspecified: Secondary | ICD-10-CM | POA: Diagnosis not present

## 2023-07-16 DIAGNOSIS — F329 Major depressive disorder, single episode, unspecified: Secondary | ICD-10-CM | POA: Diagnosis not present

## 2023-07-16 DIAGNOSIS — R0902 Hypoxemia: Secondary | ICD-10-CM | POA: Diagnosis not present

## 2023-07-16 DIAGNOSIS — J45909 Unspecified asthma, uncomplicated: Secondary | ICD-10-CM | POA: Diagnosis not present

## 2023-07-16 DIAGNOSIS — E66812 Obesity, class 2: Secondary | ICD-10-CM | POA: Diagnosis not present

## 2023-07-16 DIAGNOSIS — E662 Morbid (severe) obesity with alveolar hypoventilation: Secondary | ICD-10-CM | POA: Diagnosis not present

## 2023-07-16 DIAGNOSIS — G929 Unspecified toxic encephalopathy: Secondary | ICD-10-CM | POA: Diagnosis not present

## 2023-07-16 DIAGNOSIS — E876 Hypokalemia: Secondary | ICD-10-CM | POA: Diagnosis not present

## 2023-07-16 DIAGNOSIS — J9691 Respiratory failure, unspecified with hypoxia: Secondary | ICD-10-CM | POA: Diagnosis not present

## 2023-07-16 DIAGNOSIS — Z452 Encounter for adjustment and management of vascular access device: Secondary | ICD-10-CM | POA: Diagnosis not present

## 2023-07-16 DIAGNOSIS — E1165 Type 2 diabetes mellitus with hyperglycemia: Secondary | ICD-10-CM | POA: Diagnosis not present

## 2023-07-16 DIAGNOSIS — J9621 Acute and chronic respiratory failure with hypoxia: Secondary | ICD-10-CM | POA: Diagnosis not present

## 2023-07-16 DIAGNOSIS — R791 Abnormal coagulation profile: Secondary | ICD-10-CM | POA: Diagnosis not present

## 2023-07-16 DIAGNOSIS — D649 Anemia, unspecified: Secondary | ICD-10-CM | POA: Diagnosis not present

## 2023-07-16 DIAGNOSIS — E8729 Other acidosis: Secondary | ICD-10-CM | POA: Diagnosis not present

## 2023-07-16 DIAGNOSIS — E860 Dehydration: Secondary | ICD-10-CM | POA: Diagnosis not present

## 2023-07-16 DIAGNOSIS — J9622 Acute and chronic respiratory failure with hypercapnia: Secondary | ICD-10-CM | POA: Diagnosis not present

## 2023-07-17 DIAGNOSIS — J45909 Unspecified asthma, uncomplicated: Secondary | ICD-10-CM | POA: Diagnosis not present

## 2023-07-17 DIAGNOSIS — E119 Type 2 diabetes mellitus without complications: Secondary | ICD-10-CM | POA: Diagnosis not present

## 2023-07-17 DIAGNOSIS — R7989 Other specified abnormal findings of blood chemistry: Secondary | ICD-10-CM | POA: Diagnosis not present

## 2023-07-17 DIAGNOSIS — J9601 Acute respiratory failure with hypoxia: Secondary | ICD-10-CM | POA: Diagnosis not present

## 2023-07-17 DIAGNOSIS — J9811 Atelectasis: Secondary | ICD-10-CM | POA: Diagnosis not present

## 2023-07-17 DIAGNOSIS — R079 Chest pain, unspecified: Secondary | ICD-10-CM | POA: Diagnosis not present

## 2023-07-17 DIAGNOSIS — R791 Abnormal coagulation profile: Secondary | ICD-10-CM | POA: Diagnosis not present

## 2023-07-18 DIAGNOSIS — R791 Abnormal coagulation profile: Secondary | ICD-10-CM | POA: Diagnosis not present

## 2023-07-18 DIAGNOSIS — E119 Type 2 diabetes mellitus without complications: Secondary | ICD-10-CM | POA: Diagnosis not present

## 2023-07-18 DIAGNOSIS — I10 Essential (primary) hypertension: Secondary | ICD-10-CM | POA: Diagnosis not present

## 2023-07-18 DIAGNOSIS — I50810 Right heart failure, unspecified: Secondary | ICD-10-CM | POA: Diagnosis not present

## 2023-07-18 DIAGNOSIS — J45909 Unspecified asthma, uncomplicated: Secondary | ICD-10-CM | POA: Diagnosis not present

## 2023-07-18 DIAGNOSIS — J9601 Acute respiratory failure with hypoxia: Secondary | ICD-10-CM | POA: Diagnosis not present

## 2023-07-18 DIAGNOSIS — J9622 Acute and chronic respiratory failure with hypercapnia: Secondary | ICD-10-CM | POA: Diagnosis not present

## 2023-07-18 DIAGNOSIS — R079 Chest pain, unspecified: Secondary | ICD-10-CM | POA: Diagnosis not present

## 2023-07-18 DIAGNOSIS — I272 Pulmonary hypertension, unspecified: Secondary | ICD-10-CM | POA: Diagnosis not present

## 2023-07-18 DIAGNOSIS — J9811 Atelectasis: Secondary | ICD-10-CM | POA: Diagnosis not present

## 2023-07-18 DIAGNOSIS — J9621 Acute and chronic respiratory failure with hypoxia: Secondary | ICD-10-CM | POA: Diagnosis not present

## 2023-07-19 DIAGNOSIS — K219 Gastro-esophageal reflux disease without esophagitis: Secondary | ICD-10-CM | POA: Diagnosis not present

## 2023-07-19 DIAGNOSIS — G4733 Obstructive sleep apnea (adult) (pediatric): Secondary | ICD-10-CM | POA: Diagnosis not present

## 2023-07-19 DIAGNOSIS — J9621 Acute and chronic respiratory failure with hypoxia: Secondary | ICD-10-CM | POA: Diagnosis not present

## 2023-07-19 DIAGNOSIS — I272 Pulmonary hypertension, unspecified: Secondary | ICD-10-CM | POA: Diagnosis not present

## 2023-07-19 DIAGNOSIS — I10 Essential (primary) hypertension: Secondary | ICD-10-CM | POA: Diagnosis not present

## 2023-07-19 DIAGNOSIS — J9622 Acute and chronic respiratory failure with hypercapnia: Secondary | ICD-10-CM | POA: Diagnosis not present

## 2023-07-19 DIAGNOSIS — J9601 Acute respiratory failure with hypoxia: Secondary | ICD-10-CM | POA: Diagnosis not present

## 2023-07-19 DIAGNOSIS — E119 Type 2 diabetes mellitus without complications: Secondary | ICD-10-CM | POA: Diagnosis not present

## 2023-07-19 DIAGNOSIS — I50810 Right heart failure, unspecified: Secondary | ICD-10-CM | POA: Diagnosis not present

## 2023-07-20 DIAGNOSIS — I50810 Right heart failure, unspecified: Secondary | ICD-10-CM | POA: Diagnosis not present

## 2023-07-20 DIAGNOSIS — G4733 Obstructive sleep apnea (adult) (pediatric): Secondary | ICD-10-CM | POA: Diagnosis not present

## 2023-07-20 DIAGNOSIS — I272 Pulmonary hypertension, unspecified: Secondary | ICD-10-CM | POA: Diagnosis not present

## 2023-07-20 DIAGNOSIS — J9601 Acute respiratory failure with hypoxia: Secondary | ICD-10-CM | POA: Diagnosis not present

## 2023-07-20 DIAGNOSIS — G929 Unspecified toxic encephalopathy: Secondary | ICD-10-CM | POA: Diagnosis not present

## 2023-07-20 DIAGNOSIS — I10 Essential (primary) hypertension: Secondary | ICD-10-CM | POA: Diagnosis not present

## 2023-07-20 DIAGNOSIS — Z452 Encounter for adjustment and management of vascular access device: Secondary | ICD-10-CM | POA: Diagnosis not present

## 2023-07-20 DIAGNOSIS — J9622 Acute and chronic respiratory failure with hypercapnia: Secondary | ICD-10-CM | POA: Diagnosis not present

## 2023-07-20 DIAGNOSIS — J9691 Respiratory failure, unspecified with hypoxia: Secondary | ICD-10-CM | POA: Diagnosis not present

## 2023-07-20 DIAGNOSIS — J9621 Acute and chronic respiratory failure with hypoxia: Secondary | ICD-10-CM | POA: Diagnosis not present

## 2023-07-20 DIAGNOSIS — J9602 Acute respiratory failure with hypercapnia: Secondary | ICD-10-CM | POA: Diagnosis not present

## 2023-07-21 DIAGNOSIS — G929 Unspecified toxic encephalopathy: Secondary | ICD-10-CM | POA: Diagnosis not present

## 2023-07-21 DIAGNOSIS — I10 Essential (primary) hypertension: Secondary | ICD-10-CM | POA: Diagnosis not present

## 2023-07-21 DIAGNOSIS — I272 Pulmonary hypertension, unspecified: Secondary | ICD-10-CM | POA: Diagnosis not present

## 2023-07-21 DIAGNOSIS — J9621 Acute and chronic respiratory failure with hypoxia: Secondary | ICD-10-CM | POA: Diagnosis not present

## 2023-07-21 DIAGNOSIS — I50810 Right heart failure, unspecified: Secondary | ICD-10-CM | POA: Diagnosis not present

## 2023-07-21 DIAGNOSIS — R0902 Hypoxemia: Secondary | ICD-10-CM | POA: Diagnosis not present

## 2023-07-21 DIAGNOSIS — G4733 Obstructive sleep apnea (adult) (pediatric): Secondary | ICD-10-CM | POA: Diagnosis not present

## 2023-07-21 DIAGNOSIS — J9601 Acute respiratory failure with hypoxia: Secondary | ICD-10-CM | POA: Diagnosis not present

## 2023-07-21 DIAGNOSIS — J9622 Acute and chronic respiratory failure with hypercapnia: Secondary | ICD-10-CM | POA: Diagnosis not present

## 2023-07-21 DIAGNOSIS — J9602 Acute respiratory failure with hypercapnia: Secondary | ICD-10-CM | POA: Diagnosis not present

## 2023-07-22 DIAGNOSIS — J9601 Acute respiratory failure with hypoxia: Secondary | ICD-10-CM | POA: Diagnosis not present

## 2023-07-22 DIAGNOSIS — R0902 Hypoxemia: Secondary | ICD-10-CM | POA: Diagnosis not present

## 2023-07-23 DIAGNOSIS — R0902 Hypoxemia: Secondary | ICD-10-CM | POA: Diagnosis not present

## 2023-07-23 DIAGNOSIS — J9601 Acute respiratory failure with hypoxia: Secondary | ICD-10-CM | POA: Diagnosis not present

## 2023-07-24 DIAGNOSIS — I272 Pulmonary hypertension, unspecified: Secondary | ICD-10-CM | POA: Diagnosis not present

## 2023-07-24 DIAGNOSIS — J9601 Acute respiratory failure with hypoxia: Secondary | ICD-10-CM | POA: Diagnosis not present

## 2023-07-24 DIAGNOSIS — J9621 Acute and chronic respiratory failure with hypoxia: Secondary | ICD-10-CM | POA: Diagnosis not present

## 2023-07-24 DIAGNOSIS — F32A Depression, unspecified: Secondary | ICD-10-CM | POA: Diagnosis not present

## 2023-07-24 DIAGNOSIS — J9622 Acute and chronic respiratory failure with hypercapnia: Secondary | ICD-10-CM | POA: Diagnosis not present

## 2023-07-24 DIAGNOSIS — I50810 Right heart failure, unspecified: Secondary | ICD-10-CM | POA: Diagnosis not present

## 2023-07-24 DIAGNOSIS — G928 Other toxic encephalopathy: Secondary | ICD-10-CM | POA: Diagnosis not present

## 2023-07-24 DIAGNOSIS — G4733 Obstructive sleep apnea (adult) (pediatric): Secondary | ICD-10-CM | POA: Diagnosis not present

## 2023-07-25 DIAGNOSIS — I272 Pulmonary hypertension, unspecified: Secondary | ICD-10-CM | POA: Diagnosis not present

## 2023-07-25 DIAGNOSIS — I50810 Right heart failure, unspecified: Secondary | ICD-10-CM | POA: Diagnosis not present

## 2023-07-25 DIAGNOSIS — J9622 Acute and chronic respiratory failure with hypercapnia: Secondary | ICD-10-CM | POA: Diagnosis not present

## 2023-07-25 DIAGNOSIS — G4733 Obstructive sleep apnea (adult) (pediatric): Secondary | ICD-10-CM | POA: Diagnosis not present

## 2023-07-25 DIAGNOSIS — J9621 Acute and chronic respiratory failure with hypoxia: Secondary | ICD-10-CM | POA: Diagnosis not present

## 2023-07-27 DIAGNOSIS — J9622 Acute and chronic respiratory failure with hypercapnia: Secondary | ICD-10-CM | POA: Diagnosis not present

## 2023-07-27 DIAGNOSIS — I50810 Right heart failure, unspecified: Secondary | ICD-10-CM | POA: Diagnosis not present

## 2023-07-27 DIAGNOSIS — G4733 Obstructive sleep apnea (adult) (pediatric): Secondary | ICD-10-CM | POA: Diagnosis not present

## 2023-07-27 DIAGNOSIS — J9621 Acute and chronic respiratory failure with hypoxia: Secondary | ICD-10-CM | POA: Diagnosis not present

## 2023-07-27 DIAGNOSIS — I272 Pulmonary hypertension, unspecified: Secondary | ICD-10-CM | POA: Diagnosis not present

## 2023-07-29 DIAGNOSIS — J9601 Acute respiratory failure with hypoxia: Secondary | ICD-10-CM | POA: Diagnosis not present

## 2023-07-29 DIAGNOSIS — K219 Gastro-esophageal reflux disease without esophagitis: Secondary | ICD-10-CM | POA: Diagnosis not present

## 2023-07-29 DIAGNOSIS — J45909 Unspecified asthma, uncomplicated: Secondary | ICD-10-CM | POA: Diagnosis not present

## 2023-07-29 DIAGNOSIS — E785 Hyperlipidemia, unspecified: Secondary | ICD-10-CM | POA: Diagnosis not present

## 2023-07-30 DIAGNOSIS — K219 Gastro-esophageal reflux disease without esophagitis: Secondary | ICD-10-CM | POA: Diagnosis not present

## 2023-07-30 DIAGNOSIS — J9602 Acute respiratory failure with hypercapnia: Secondary | ICD-10-CM | POA: Diagnosis not present

## 2023-07-30 DIAGNOSIS — J9601 Acute respiratory failure with hypoxia: Secondary | ICD-10-CM | POA: Diagnosis not present

## 2023-07-30 DIAGNOSIS — I272 Pulmonary hypertension, unspecified: Secondary | ICD-10-CM | POA: Diagnosis not present

## 2023-07-31 DIAGNOSIS — J9622 Acute and chronic respiratory failure with hypercapnia: Secondary | ICD-10-CM | POA: Diagnosis not present

## 2023-07-31 DIAGNOSIS — G4733 Obstructive sleep apnea (adult) (pediatric): Secondary | ICD-10-CM | POA: Diagnosis not present

## 2023-07-31 DIAGNOSIS — J9621 Acute and chronic respiratory failure with hypoxia: Secondary | ICD-10-CM | POA: Diagnosis not present

## 2023-07-31 DIAGNOSIS — I10 Essential (primary) hypertension: Secondary | ICD-10-CM | POA: Diagnosis not present

## 2023-08-01 DIAGNOSIS — G4733 Obstructive sleep apnea (adult) (pediatric): Secondary | ICD-10-CM | POA: Diagnosis not present

## 2023-08-01 DIAGNOSIS — J9622 Acute and chronic respiratory failure with hypercapnia: Secondary | ICD-10-CM | POA: Diagnosis not present

## 2023-08-01 DIAGNOSIS — J9621 Acute and chronic respiratory failure with hypoxia: Secondary | ICD-10-CM | POA: Diagnosis not present

## 2023-08-01 DIAGNOSIS — I10 Essential (primary) hypertension: Secondary | ICD-10-CM | POA: Diagnosis not present

## 2023-08-16 DIAGNOSIS — R918 Other nonspecific abnormal finding of lung field: Secondary | ICD-10-CM | POA: Diagnosis not present

## 2023-08-16 DIAGNOSIS — R0602 Shortness of breath: Secondary | ICD-10-CM | POA: Diagnosis not present

## 2023-08-16 DIAGNOSIS — R509 Fever, unspecified: Secondary | ICD-10-CM | POA: Diagnosis not present

## 2023-08-16 DIAGNOSIS — Z743 Need for continuous supervision: Secondary | ICD-10-CM | POA: Diagnosis not present

## 2023-08-17 DIAGNOSIS — J9621 Acute and chronic respiratory failure with hypoxia: Secondary | ICD-10-CM | POA: Diagnosis not present

## 2023-08-17 DIAGNOSIS — I5033 Acute on chronic diastolic (congestive) heart failure: Secondary | ICD-10-CM | POA: Diagnosis not present

## 2023-08-17 DIAGNOSIS — J9622 Acute and chronic respiratory failure with hypercapnia: Secondary | ICD-10-CM | POA: Diagnosis not present

## 2023-08-17 DIAGNOSIS — I509 Heart failure, unspecified: Secondary | ICD-10-CM | POA: Diagnosis not present

## 2023-08-17 DIAGNOSIS — J189 Pneumonia, unspecified organism: Secondary | ICD-10-CM | POA: Diagnosis not present

## 2023-08-18 DIAGNOSIS — J9622 Acute and chronic respiratory failure with hypercapnia: Secondary | ICD-10-CM | POA: Diagnosis not present

## 2023-08-18 DIAGNOSIS — I5033 Acute on chronic diastolic (congestive) heart failure: Secondary | ICD-10-CM | POA: Diagnosis not present

## 2023-08-18 DIAGNOSIS — J189 Pneumonia, unspecified organism: Secondary | ICD-10-CM | POA: Diagnosis not present

## 2023-08-18 DIAGNOSIS — J9621 Acute and chronic respiratory failure with hypoxia: Secondary | ICD-10-CM | POA: Diagnosis not present

## 2023-08-19 DIAGNOSIS — I5033 Acute on chronic diastolic (congestive) heart failure: Secondary | ICD-10-CM | POA: Diagnosis not present

## 2023-08-19 DIAGNOSIS — J189 Pneumonia, unspecified organism: Secondary | ICD-10-CM | POA: Diagnosis not present

## 2023-08-19 DIAGNOSIS — J9621 Acute and chronic respiratory failure with hypoxia: Secondary | ICD-10-CM | POA: Diagnosis not present

## 2023-08-19 DIAGNOSIS — J9622 Acute and chronic respiratory failure with hypercapnia: Secondary | ICD-10-CM | POA: Diagnosis not present

## 2023-08-20 DIAGNOSIS — J9621 Acute and chronic respiratory failure with hypoxia: Secondary | ICD-10-CM | POA: Diagnosis not present

## 2023-08-20 DIAGNOSIS — J189 Pneumonia, unspecified organism: Secondary | ICD-10-CM | POA: Diagnosis not present

## 2023-08-20 DIAGNOSIS — I5033 Acute on chronic diastolic (congestive) heart failure: Secondary | ICD-10-CM | POA: Diagnosis not present

## 2023-08-20 DIAGNOSIS — J9622 Acute and chronic respiratory failure with hypercapnia: Secondary | ICD-10-CM | POA: Diagnosis not present

## 2023-08-21 DIAGNOSIS — J189 Pneumonia, unspecified organism: Secondary | ICD-10-CM | POA: Diagnosis not present

## 2023-08-21 DIAGNOSIS — Z9981 Dependence on supplemental oxygen: Secondary | ICD-10-CM | POA: Diagnosis not present

## 2023-08-21 DIAGNOSIS — J9622 Acute and chronic respiratory failure with hypercapnia: Secondary | ICD-10-CM | POA: Diagnosis not present

## 2023-08-21 DIAGNOSIS — I5033 Acute on chronic diastolic (congestive) heart failure: Secondary | ICD-10-CM | POA: Diagnosis not present

## 2023-08-21 DIAGNOSIS — J9621 Acute and chronic respiratory failure with hypoxia: Secondary | ICD-10-CM | POA: Diagnosis not present

## 2023-08-29 DIAGNOSIS — J9620 Acute and chronic respiratory failure, unspecified whether with hypoxia or hypercapnia: Secondary | ICD-10-CM | POA: Diagnosis not present

## 2023-08-29 DIAGNOSIS — J9601 Acute respiratory failure with hypoxia: Secondary | ICD-10-CM | POA: Diagnosis not present

## 2023-09-22 DIAGNOSIS — J4520 Mild intermittent asthma, uncomplicated: Secondary | ICD-10-CM | POA: Diagnosis not present

## 2023-09-22 DIAGNOSIS — G4733 Obstructive sleep apnea (adult) (pediatric): Secondary | ICD-10-CM | POA: Diagnosis not present

## 2023-09-22 DIAGNOSIS — R0902 Hypoxemia: Secondary | ICD-10-CM | POA: Diagnosis not present

## 2023-09-22 DIAGNOSIS — J9610 Chronic respiratory failure, unspecified whether with hypoxia or hypercapnia: Secondary | ICD-10-CM | POA: Diagnosis not present

## 2023-09-22 DIAGNOSIS — J45909 Unspecified asthma, uncomplicated: Secondary | ICD-10-CM | POA: Diagnosis not present

## 2023-09-29 DIAGNOSIS — J9601 Acute respiratory failure with hypoxia: Secondary | ICD-10-CM | POA: Diagnosis not present

## 2023-10-06 DIAGNOSIS — E782 Mixed hyperlipidemia: Secondary | ICD-10-CM | POA: Diagnosis not present

## 2023-10-06 DIAGNOSIS — E119 Type 2 diabetes mellitus without complications: Secondary | ICD-10-CM | POA: Diagnosis not present

## 2023-10-06 DIAGNOSIS — E1169 Type 2 diabetes mellitus with other specified complication: Secondary | ICD-10-CM | POA: Diagnosis not present

## 2023-10-11 DIAGNOSIS — J9601 Acute respiratory failure with hypoxia: Secondary | ICD-10-CM | POA: Diagnosis not present

## 2023-10-11 DIAGNOSIS — I5033 Acute on chronic diastolic (congestive) heart failure: Secondary | ICD-10-CM | POA: Diagnosis not present

## 2023-10-29 DIAGNOSIS — J9601 Acute respiratory failure with hypoxia: Secondary | ICD-10-CM | POA: Diagnosis not present

## 2023-11-11 DIAGNOSIS — J9601 Acute respiratory failure with hypoxia: Secondary | ICD-10-CM | POA: Diagnosis not present

## 2023-11-11 DIAGNOSIS — I5033 Acute on chronic diastolic (congestive) heart failure: Secondary | ICD-10-CM | POA: Diagnosis not present

## 2023-11-16 DIAGNOSIS — I739 Peripheral vascular disease, unspecified: Secondary | ICD-10-CM | POA: Diagnosis not present

## 2023-11-16 DIAGNOSIS — B351 Tinea unguium: Secondary | ICD-10-CM | POA: Diagnosis not present

## 2023-11-16 DIAGNOSIS — E1165 Type 2 diabetes mellitus with hyperglycemia: Secondary | ICD-10-CM | POA: Diagnosis not present

## 2023-11-16 DIAGNOSIS — L84 Corns and callosities: Secondary | ICD-10-CM | POA: Diagnosis not present

## 2023-11-20 DIAGNOSIS — L97521 Non-pressure chronic ulcer of other part of left foot limited to breakdown of skin: Secondary | ICD-10-CM | POA: Diagnosis not present

## 2023-11-20 DIAGNOSIS — M67471 Ganglion, right ankle and foot: Secondary | ICD-10-CM | POA: Diagnosis not present

## 2023-11-29 DIAGNOSIS — J9601 Acute respiratory failure with hypoxia: Secondary | ICD-10-CM | POA: Diagnosis not present

## 2023-12-11 DIAGNOSIS — I5033 Acute on chronic diastolic (congestive) heart failure: Secondary | ICD-10-CM | POA: Diagnosis not present

## 2023-12-11 DIAGNOSIS — J9601 Acute respiratory failure with hypoxia: Secondary | ICD-10-CM | POA: Diagnosis not present

## 2023-12-29 DIAGNOSIS — J9601 Acute respiratory failure with hypoxia: Secondary | ICD-10-CM | POA: Diagnosis not present

## 2024-01-24 DIAGNOSIS — M67472 Ganglion, left ankle and foot: Secondary | ICD-10-CM | POA: Diagnosis not present

## 2024-01-24 DIAGNOSIS — E1165 Type 2 diabetes mellitus with hyperglycemia: Secondary | ICD-10-CM | POA: Diagnosis not present

## 2024-01-24 DIAGNOSIS — B351 Tinea unguium: Secondary | ICD-10-CM | POA: Diagnosis not present

## 2024-01-24 DIAGNOSIS — I739 Peripheral vascular disease, unspecified: Secondary | ICD-10-CM | POA: Diagnosis not present

## 2024-01-24 DIAGNOSIS — M67471 Ganglion, right ankle and foot: Secondary | ICD-10-CM | POA: Diagnosis not present

## 2024-01-24 DIAGNOSIS — L84 Corns and callosities: Secondary | ICD-10-CM | POA: Diagnosis not present

## 2024-01-25 DIAGNOSIS — E662 Morbid (severe) obesity with alveolar hypoventilation: Secondary | ICD-10-CM | POA: Diagnosis not present

## 2024-01-25 DIAGNOSIS — Q909 Down syndrome, unspecified: Secondary | ICD-10-CM | POA: Diagnosis not present

## 2024-01-29 DIAGNOSIS — J9601 Acute respiratory failure with hypoxia: Secondary | ICD-10-CM | POA: Diagnosis not present

## 2024-01-30 DIAGNOSIS — E1169 Type 2 diabetes mellitus with other specified complication: Secondary | ICD-10-CM | POA: Diagnosis not present

## 2024-01-30 DIAGNOSIS — E119 Type 2 diabetes mellitus without complications: Secondary | ICD-10-CM | POA: Diagnosis not present

## 2024-01-30 DIAGNOSIS — E782 Mixed hyperlipidemia: Secondary | ICD-10-CM | POA: Diagnosis not present

## 2024-02-06 DIAGNOSIS — K029 Dental caries, unspecified: Secondary | ICD-10-CM | POA: Diagnosis not present

## 2024-02-06 DIAGNOSIS — K0381 Cracked tooth: Secondary | ICD-10-CM | POA: Diagnosis not present

## 2024-02-20 DIAGNOSIS — K0381 Cracked tooth: Secondary | ICD-10-CM | POA: Diagnosis not present

## 2024-02-21 DIAGNOSIS — I10 Essential (primary) hypertension: Secondary | ICD-10-CM | POA: Diagnosis not present

## 2024-02-21 DIAGNOSIS — E785 Hyperlipidemia, unspecified: Secondary | ICD-10-CM | POA: Diagnosis not present

## 2024-02-21 DIAGNOSIS — I5030 Unspecified diastolic (congestive) heart failure: Secondary | ICD-10-CM | POA: Diagnosis not present

## 2024-02-25 DIAGNOSIS — E662 Morbid (severe) obesity with alveolar hypoventilation: Secondary | ICD-10-CM | POA: Diagnosis not present

## 2024-02-25 DIAGNOSIS — Q909 Down syndrome, unspecified: Secondary | ICD-10-CM | POA: Diagnosis not present

## 2024-02-29 DIAGNOSIS — J9601 Acute respiratory failure with hypoxia: Secondary | ICD-10-CM | POA: Diagnosis not present

## 2024-03-26 DIAGNOSIS — Q909 Down syndrome, unspecified: Secondary | ICD-10-CM | POA: Diagnosis not present

## 2024-03-26 DIAGNOSIS — E662 Morbid (severe) obesity with alveolar hypoventilation: Secondary | ICD-10-CM | POA: Diagnosis not present

## 2024-03-30 DIAGNOSIS — J9601 Acute respiratory failure with hypoxia: Secondary | ICD-10-CM | POA: Diagnosis not present

## 2024-04-16 DIAGNOSIS — J9612 Chronic respiratory failure with hypercapnia: Secondary | ICD-10-CM | POA: Diagnosis not present

## 2024-04-16 DIAGNOSIS — J45909 Unspecified asthma, uncomplicated: Secondary | ICD-10-CM | POA: Diagnosis not present

## 2024-04-16 DIAGNOSIS — I421 Obstructive hypertrophic cardiomyopathy: Secondary | ICD-10-CM | POA: Diagnosis not present

## 2024-04-16 DIAGNOSIS — G4733 Obstructive sleep apnea (adult) (pediatric): Secondary | ICD-10-CM | POA: Diagnosis not present

## 2024-04-16 DIAGNOSIS — J9611 Chronic respiratory failure with hypoxia: Secondary | ICD-10-CM | POA: Diagnosis not present

## 2024-04-16 DIAGNOSIS — E662 Morbid (severe) obesity with alveolar hypoventilation: Secondary | ICD-10-CM | POA: Diagnosis not present

## 2024-04-18 DIAGNOSIS — J45909 Unspecified asthma, uncomplicated: Secondary | ICD-10-CM | POA: Diagnosis not present

## 2024-04-18 DIAGNOSIS — G4733 Obstructive sleep apnea (adult) (pediatric): Secondary | ICD-10-CM | POA: Diagnosis not present

## 2024-04-18 DIAGNOSIS — E662 Morbid (severe) obesity with alveolar hypoventilation: Secondary | ICD-10-CM | POA: Diagnosis not present

## 2024-04-18 DIAGNOSIS — I421 Obstructive hypertrophic cardiomyopathy: Secondary | ICD-10-CM | POA: Diagnosis not present

## 2024-04-18 DIAGNOSIS — J9611 Chronic respiratory failure with hypoxia: Secondary | ICD-10-CM | POA: Diagnosis not present

## 2024-04-18 DIAGNOSIS — J9612 Chronic respiratory failure with hypercapnia: Secondary | ICD-10-CM | POA: Diagnosis not present

## 2024-04-26 DIAGNOSIS — E662 Morbid (severe) obesity with alveolar hypoventilation: Secondary | ICD-10-CM | POA: Diagnosis not present

## 2024-04-26 DIAGNOSIS — Q909 Down syndrome, unspecified: Secondary | ICD-10-CM | POA: Diagnosis not present

## 2024-04-30 DIAGNOSIS — J9601 Acute respiratory failure with hypoxia: Secondary | ICD-10-CM | POA: Diagnosis not present

## 2024-05-13 DIAGNOSIS — G4730 Sleep apnea, unspecified: Secondary | ICD-10-CM | POA: Diagnosis not present

## 2024-05-13 DIAGNOSIS — R0902 Hypoxemia: Secondary | ICD-10-CM | POA: Diagnosis not present

## 2024-05-26 DIAGNOSIS — E662 Morbid (severe) obesity with alveolar hypoventilation: Secondary | ICD-10-CM | POA: Diagnosis not present

## 2024-05-26 DIAGNOSIS — Q909 Down syndrome, unspecified: Secondary | ICD-10-CM | POA: Diagnosis not present

## 2024-05-30 DIAGNOSIS — H25813 Combined forms of age-related cataract, bilateral: Secondary | ICD-10-CM | POA: Diagnosis not present

## 2024-05-30 DIAGNOSIS — H524 Presbyopia: Secondary | ICD-10-CM | POA: Diagnosis not present

## 2024-05-30 DIAGNOSIS — E119 Type 2 diabetes mellitus without complications: Secondary | ICD-10-CM | POA: Diagnosis not present

## 2024-05-30 DIAGNOSIS — J9601 Acute respiratory failure with hypoxia: Secondary | ICD-10-CM | POA: Diagnosis not present

## 2024-05-30 DIAGNOSIS — H5005 Alternating esotropia: Secondary | ICD-10-CM | POA: Diagnosis not present
